# Patient Record
Sex: Male | Born: 1960 | Race: White | Hispanic: No | Marital: Married | Smoking: Never smoker
Health system: Southern US, Community
[De-identification: ages and names within clinical notes are randomized; demographics above are authoritative.]

## PROBLEM LIST (undated history)

## (undated) DIAGNOSIS — M549 Dorsalgia, unspecified: Secondary | ICD-10-CM

## (undated) DIAGNOSIS — K7581 Nonalcoholic steatohepatitis (NASH): Secondary | ICD-10-CM

## (undated) DIAGNOSIS — E785 Hyperlipidemia, unspecified: Secondary | ICD-10-CM

## (undated) DIAGNOSIS — G8929 Other chronic pain: Secondary | ICD-10-CM

## (undated) DIAGNOSIS — K7460 Unspecified cirrhosis of liver: Secondary | ICD-10-CM

## (undated) DIAGNOSIS — I8500 Esophageal varices without bleeding: Secondary | ICD-10-CM

## (undated) DIAGNOSIS — E119 Type 2 diabetes mellitus without complications: Secondary | ICD-10-CM

## (undated) HISTORY — DX: Hyperlipidemia, unspecified: E78.5

## (undated) HISTORY — DX: Type 2 diabetes mellitus without complications: E11.9

## (undated) HISTORY — PX: EXCESSIVE THIGH / HIP / BUTTOCK / FLANK SKIN EXCISION: SUR516

## (undated) HISTORY — DX: Dorsalgia, unspecified: M54.9

## (undated) HISTORY — PX: TONSILLECTOMY AND ADENOIDECTOMY: SUR1326

## (undated) HISTORY — DX: Unspecified cirrhosis of liver: K74.60

## (undated) HISTORY — PX: KIDNEY STONE SURGERY: SHX686

## (undated) HISTORY — DX: Other chronic pain: G89.29

---

## 2004-04-07 ENCOUNTER — Ambulatory Visit: Payer: Self-pay | Admitting: Internal Medicine

## 2004-04-23 ENCOUNTER — Ambulatory Visit: Payer: Self-pay | Admitting: Internal Medicine

## 2004-06-10 ENCOUNTER — Ambulatory Visit: Payer: Self-pay | Admitting: Internal Medicine

## 2004-06-17 ENCOUNTER — Other Ambulatory Visit: Payer: Self-pay

## 2004-06-18 ENCOUNTER — Ambulatory Visit: Payer: Self-pay | Admitting: Urology

## 2004-08-17 ENCOUNTER — Ambulatory Visit: Payer: Self-pay | Admitting: Internal Medicine

## 2005-11-26 ENCOUNTER — Ambulatory Visit: Payer: Self-pay | Admitting: Internal Medicine

## 2006-01-19 ENCOUNTER — Ambulatory Visit: Payer: Self-pay | Admitting: Internal Medicine

## 2006-02-19 ENCOUNTER — Ambulatory Visit: Payer: Self-pay | Admitting: Internal Medicine

## 2007-09-01 ENCOUNTER — Ambulatory Visit: Payer: Self-pay | Admitting: Gastroenterology

## 2007-12-20 ENCOUNTER — Ambulatory Visit: Payer: Self-pay | Admitting: Internal Medicine

## 2008-08-22 LAB — HM COLONOSCOPY

## 2009-10-07 ENCOUNTER — Emergency Department: Payer: Self-pay | Admitting: Emergency Medicine

## 2010-07-22 ENCOUNTER — Inpatient Hospital Stay: Payer: Self-pay | Admitting: Internal Medicine

## 2012-05-08 DIAGNOSIS — I864 Gastric varices: Secondary | ICD-10-CM | POA: Insufficient documentation

## 2012-05-08 DIAGNOSIS — I8390 Asymptomatic varicose veins of unspecified lower extremity: Secondary | ICD-10-CM | POA: Insufficient documentation

## 2012-05-08 DIAGNOSIS — I8500 Esophageal varices without bleeding: Secondary | ICD-10-CM | POA: Insufficient documentation

## 2012-05-23 DIAGNOSIS — K7682 Hepatic encephalopathy: Secondary | ICD-10-CM | POA: Insufficient documentation

## 2012-05-23 DIAGNOSIS — K7290 Hepatic failure, unspecified without coma: Secondary | ICD-10-CM | POA: Insufficient documentation

## 2012-11-14 LAB — TSH: TSH: 2.5 u[IU]/mL (ref <=5.90)

## 2013-06-11 ENCOUNTER — Emergency Department: Payer: Self-pay | Admitting: Emergency Medicine

## 2013-06-11 LAB — URINALYSIS, COMPLETE
Bacteria: NONE SEEN
Bilirubin,UR: NEGATIVE
Glucose,UR: >=500 mg/dL (ref 0–75)
Nitrite: NEGATIVE
Ph: 5 (ref 4.5–8.0)
Protein: NEGATIVE
RBC,UR: 7 /HPF (ref 0–5)
Specific Gravity: 1.031 (ref 1.003–1.030)
Squamous Epithelial: 2
WBC UR: 3 /HPF (ref 0–5)

## 2013-06-11 LAB — COMPREHENSIVE METABOLIC PANEL
Albumin: 3.7 g/dL (ref 3.4–5.0)
Alkaline Phosphatase: 159 U/L — ABNORMAL HIGH
Anion Gap: 6 — ABNORMAL LOW (ref 7–16)
BUN: 13 mg/dL (ref 7–18)
Bilirubin,Total: 2 mg/dL — ABNORMAL HIGH (ref 0.2–1.0)
Calcium, Total: 8.8 mg/dL (ref 8.5–10.1)
Chloride: 101 mmol/L (ref 98–107)
Co2: 26 mmol/L (ref 21–32)
Creatinine: 1.14 mg/dL (ref 0.60–1.30)
EGFR (African American): >60
EGFR (Non-African Amer.): >60
Glucose: 251 mg/dL — ABNORMAL HIGH (ref 65–99)
Osmolality: 275 (ref 275–301)
Potassium: 4.1 mmol/L (ref 3.5–5.1)
SGOT(AST): 61 U/L — ABNORMAL HIGH (ref 15–37)
SGPT (ALT): 45 U/L (ref 12–78)
Sodium: 133 mmol/L — ABNORMAL LOW (ref 136–145)
Total Protein: 7.3 g/dL (ref 6.4–8.2)

## 2013-06-11 LAB — CBC
HCT: 42.9 % (ref 40.0–52.0)
HGB: 14.2 g/dL (ref 13.0–18.0)
MCH: 30.5 pg (ref 26.0–34.0)
MCHC: 33.2 g/dL (ref 32.0–36.0)
MCV: 92 fL (ref 80–100)
Platelet: 57 10*3/uL — ABNORMAL LOW (ref 150–440)
RBC: 4.66 10*6/uL (ref 4.40–5.90)
RDW: 12.8 % (ref 11.5–14.5)
WBC: 6.1 10*3/uL (ref 3.8–10.6)

## 2013-06-11 LAB — LIPASE, BLOOD: Lipase: 330 U/L (ref 73–393)

## 2013-06-13 LAB — URINE CULTURE

## 2014-09-30 LAB — HEPATIC FUNCTION PANEL
ALT: 27 U/L (ref 10–40)
AST: 30 U/L (ref 14–40)
Alkaline Phosphatase: 149 U/L — AB (ref 25–125)
Bilirubin, Total: 1.6 mg/dL

## 2014-09-30 LAB — BASIC METABOLIC PANEL
BUN: 11 mg/dL (ref 4–21)
Creatinine: 1 mg/dL (ref <=1.3)
Glucose: 261 mg/dL
Potassium: 4.3 mmol/L (ref 3.4–5.3)
Sodium: 139 mmol/L (ref 137–147)

## 2014-09-30 LAB — LIPID PANEL
Cholesterol: 140 mg/dL (ref 0–200)
HDL: 42 mg/dL (ref 35–70)
LDL Cholesterol: 105 mg/dL
LDl/HDL Ratio: 2.5
Triglycerides: 114 mg/dL (ref 40–160)

## 2014-09-30 LAB — CBC AND DIFFERENTIAL
HCT: 43 % (ref 41–53)
Hemoglobin: 15.2 g/dL (ref 13.5–17.5)
Neutrophils Absolute: 66 /uL
Platelets: 70 10*3/uL — AB (ref 150–399)
WBC: 7.6 10^3/mL

## 2014-09-30 LAB — HEMOGLOBIN A1C: Hgb A1c MFr Bld: 9.1 % — AB (ref 4.0–6.0)

## 2014-11-12 ENCOUNTER — Ambulatory Visit
Admission: RE | Admit: 2014-11-12 | Discharge: 2014-11-12 | Disposition: A | Discharge status: Home / Self Care | Payer: 59 | Source: Ambulatory Visit | Attending: Family Medicine | Admitting: Family Medicine

## 2014-11-12 ENCOUNTER — Other Ambulatory Visit: Payer: Self-pay | Admitting: Family Medicine

## 2014-11-12 DIAGNOSIS — T148XXA Other injury of unspecified body region, initial encounter: Secondary | ICD-10-CM

## 2014-11-12 DIAGNOSIS — M549 Dorsalgia, unspecified: Secondary | ICD-10-CM | POA: Diagnosis not present

## 2014-11-12 DIAGNOSIS — T148 Other injury of unspecified body region: Secondary | ICD-10-CM | POA: Diagnosis present

## 2014-11-12 DIAGNOSIS — R52 Pain, unspecified: Secondary | ICD-10-CM

## 2014-11-12 DIAGNOSIS — J9811 Atelectasis: Secondary | ICD-10-CM | POA: Diagnosis not present

## 2014-11-21 ENCOUNTER — Telehealth: Payer: Self-pay

## 2014-11-21 ENCOUNTER — Telehealth: Payer: Self-pay | Admitting: Family Medicine

## 2014-11-21 NOTE — Telephone Encounter (Signed)
Patient called back and states this is the time of the day where his back pain is worse and wanted to see if he can increase Oxycodone to 2 tablets or if something different needs to be prescribed. Spoke with Dr. Rosanna Randy and advised as follows-takes Oxycodone 5 mg 2 tablets now and then can take every 3 hours up to 2 tablets at a time and see how patient does over night. Will re access tomorrow when we see the patient.

## 2014-11-21 NOTE — Telephone Encounter (Signed)
Pt would like a call back because he is in more pain than he was at his OV on 11/12/14. Pt wanted to know if he should come back in or be referred to a chiropractor. Thanks TNP

## 2014-11-21 NOTE — Telephone Encounter (Signed)
Spoke with patient and Dr. Rosanna Randy. Appt made for tomorrow morning at 8:30 am. Advised patient if symptoms get worse or new concerning symptoms develop to go to ER. Pt understood.

## 2014-11-22 ENCOUNTER — Ambulatory Visit: Payer: Self-pay | Admitting: Family Medicine

## 2014-11-22 ENCOUNTER — Ambulatory Visit (INDEPENDENT_AMBULATORY_CARE_PROVIDER_SITE_OTHER): Payer: 59 | Admitting: Family Medicine

## 2014-11-22 ENCOUNTER — Ambulatory Visit
Admission: RE | Admit: 2014-11-22 | Discharge: 2014-11-22 | Disposition: A | Discharge status: Home / Self Care | Payer: 59 | Source: Ambulatory Visit | Attending: Family Medicine | Admitting: Family Medicine

## 2014-11-22 ENCOUNTER — Encounter: Payer: Self-pay | Admitting: Family Medicine

## 2014-11-22 ENCOUNTER — Telehealth: Payer: Self-pay

## 2014-11-22 VITALS — BP 124/76 | HR 80 | Temp 98.6°F | Resp 14 | Wt 317.0 lb

## 2014-11-22 DIAGNOSIS — I10 Essential (primary) hypertension: Secondary | ICD-10-CM | POA: Insufficient documentation

## 2014-11-22 DIAGNOSIS — E669 Obesity, unspecified: Secondary | ICD-10-CM | POA: Insufficient documentation

## 2014-11-22 DIAGNOSIS — K8020 Calculus of gallbladder without cholecystitis without obstruction: Secondary | ICD-10-CM | POA: Insufficient documentation

## 2014-11-22 DIAGNOSIS — D696 Thrombocytopenia, unspecified: Secondary | ICD-10-CM | POA: Diagnosis present

## 2014-11-22 DIAGNOSIS — E785 Hyperlipidemia, unspecified: Secondary | ICD-10-CM | POA: Insufficient documentation

## 2014-11-22 DIAGNOSIS — T1490 Injury, unspecified: Secondary | ICD-10-CM | POA: Diagnosis present

## 2014-11-22 DIAGNOSIS — N529 Male erectile dysfunction, unspecified: Secondary | ICD-10-CM | POA: Insufficient documentation

## 2014-11-22 DIAGNOSIS — K7460 Unspecified cirrhosis of liver: Secondary | ICD-10-CM | POA: Insufficient documentation

## 2014-11-22 DIAGNOSIS — R233 Spontaneous ecchymoses: Secondary | ICD-10-CM

## 2014-11-22 DIAGNOSIS — F329 Major depressive disorder, single episode, unspecified: Secondary | ICD-10-CM | POA: Insufficient documentation

## 2014-11-22 DIAGNOSIS — S20221S Contusion of right back wall of thorax, sequela: Secondary | ICD-10-CM

## 2014-11-22 DIAGNOSIS — X58XXXA Exposure to other specified factors, initial encounter: Secondary | ICD-10-CM | POA: Insufficient documentation

## 2014-11-22 DIAGNOSIS — M549 Dorsalgia, unspecified: Secondary | ICD-10-CM

## 2014-11-22 DIAGNOSIS — K7469 Other cirrhosis of liver: Secondary | ICD-10-CM | POA: Diagnosis not present

## 2014-11-22 DIAGNOSIS — E1169 Type 2 diabetes mellitus with other specified complication: Secondary | ICD-10-CM | POA: Insufficient documentation

## 2014-11-22 DIAGNOSIS — G4700 Insomnia, unspecified: Secondary | ICD-10-CM | POA: Insufficient documentation

## 2014-11-22 DIAGNOSIS — E291 Testicular hypofunction: Secondary | ICD-10-CM | POA: Insufficient documentation

## 2014-11-22 DIAGNOSIS — R3129 Other microscopic hematuria: Secondary | ICD-10-CM

## 2014-11-22 DIAGNOSIS — R238 Other skin changes: Secondary | ICD-10-CM

## 2014-11-22 DIAGNOSIS — K219 Gastro-esophageal reflux disease without esophagitis: Secondary | ICD-10-CM | POA: Insufficient documentation

## 2014-11-22 DIAGNOSIS — G8929 Other chronic pain: Secondary | ICD-10-CM | POA: Insufficient documentation

## 2014-11-22 DIAGNOSIS — E559 Vitamin D deficiency, unspecified: Secondary | ICD-10-CM | POA: Insufficient documentation

## 2014-11-22 DIAGNOSIS — R312 Other microscopic hematuria: Secondary | ICD-10-CM

## 2014-11-22 DIAGNOSIS — IMO0002 Reserved for concepts with insufficient information to code with codable children: Secondary | ICD-10-CM | POA: Insufficient documentation

## 2014-11-22 LAB — POCT URINALYSIS DIPSTICK
Bilirubin, UA: NEGATIVE
Glucose, UA: 500
Ketones, UA: NEGATIVE
Leukocytes, UA: NEGATIVE
Nitrite, UA: NEGATIVE
Protein, UA: NEGATIVE
Spec Grav, UA: 1.02
Urobilinogen, UA: NEGATIVE
pH, UA: 7

## 2014-11-22 LAB — POCT UA - MICROSCOPIC ONLY
Bacteria, U Microscopic: NEGATIVE
Casts, Ur, LPF, POC: NEGATIVE
Crystals, Ur, HPF, POC: NEGATIVE
Epithelial cells, urine per micros: NEGATIVE
Mucus, UA: NEGATIVE
Yeast, UA: NEGATIVE

## 2014-11-22 MED ORDER — OXYCODONE HCL 5 MG PO CAPS: ORAL_CAPSULE | ORAL | Status: DC

## 2014-11-22 NOTE — Telephone Encounter (Signed)
Talked to [place] Clinician, regarding [place] results,  According to report  There was no Evidence of fluid intraperitoneal.  Korea can be sensitive to solid organs. They recommend CT.  Talked to MD Rosanna Randy, MD advised for pt to come back to the office.  Call was disconnected while talking to provider. Left message on pt's cell phone number to come back to office and if any questions to call.

## 2014-11-22 NOTE — Progress Notes (Signed)
Patient ID: NANDAN WILLEMS, male   DOB: 1960-09-05, 54 y.o.   MRN: [id number] Name: Jesse Zimmerman   MRN: [id number]    DOB: [birth year]   Date:11/22/2014       Progress Note  Subjective  Chief Complaint  Chief Complaint  Patient presents with  . Back Pain    x 2 weeks  . Hematuria    on the exam during his last visit  . Bleeding/Bruising    worsening from a week ago    Back Pain The problem has been gradually worsening (Patient is here for follow up. He was last seen on May 24th after falling in the tub on his right dise of the body. Rib xray was done which showed atelectasis, was given Oxycodone 5 mg and Valium to take as needed) since onset. Pertinent negatives include no chest pain, dysuria, fever or weakness. Risk factors include recent trauma. The treatment provided mild (He has taking Oxycodone 5 mg but that did not seem to help at all, he has taking Advil some too and Valium as needed. He did take Oxycodone 2 tablets last night and was able to finally sleep better. He states he is worried due to bruising getting worse ) relief.  Hematuria This is a new (this was found on the exam on his last visit) problem. Irritative symptoms do not include frequency or urgency. Associated symptoms include nausea. Pertinent negatives include no chills, dysuria, fever or flank pain.     Past Surgical History  Procedure Laterality Date  . Kidney stone surgery      removed  . Excessive thigh / hip / buttock / flank skin excision      PART OF INNER THIGH/DUE TO SEPSIS INFECTION REMOVED  . Tonsillectomy and adenoidectomy      Family History  Problem Relation Age of Onset  . Hypertension Mother   . Breast cancer Mother   . Dementia Mother   . Heart attack Father   . Gout Father   . Hypertension Father   . Hypertension Sister   . Colon cancer Maternal Aunt   . Colon cancer Maternal Grandmother   . Colon cancer Paternal Grandmother   . Hypertension Sister     History   Social History   . Marital Status: Married    Spouse Name: Velva Harman  . Number of Children: 0  . Years of Education: college   Occupational History  . work at [employer] Topics  . Smoking status: Never Smoker   . Smokeless tobacco: Never Used  . Alcohol Use: No  . Drug Use: No  . Sexual Activity: Not Currently   Other Topics Concern  . Not on file   Social History Narrative     Current outpatient prescriptions:  .  Canagliflozin-Metformin HCl 50-1000 MG TABS, Take by mouth., Disp: , Rfl:  .  diazepam (VALIUM) 5 MG tablet, Take by mouth., Disp: , Rfl:  .  furosemide (LASIX) 20 MG tablet, Take by mouth., Disp: , Rfl:  .  nadolol (CORGARD) 20 MG tablet, Take by mouth., Disp: , Rfl:  .  oxycodone (OXY-IR) 5 MG capsule, 1  To 2 tablets every 3 hours as needed for severe pain, Disp: 150 capsule, Rfl: 0 .  ergocalciferol (VITAMIN D2) 50000 UNITS capsule, Take by mouth., Disp: , Rfl:  .  HYDROcodone-acetaminophen (NORCO) 10-325 MG per tablet, Take by mouth., Disp: , Rfl:  .  ondansetron (ZOFRAN) 8 MG tablet, Take by  mouth., Disp: , Rfl:   Allergies  Allergen Reactions  . Latex     LATEX TAPE  . Pineapple Hives     Review of Systems  Constitutional: Positive for malaise/fatigue (no appetite). Negative for fever and chills.  Cardiovascular: Negative for chest pain, palpitations, orthopnea and claudication.  Gastrointestinal: Positive for nausea.  Genitourinary: Negative for dysuria, urgency, frequency, hematuria and flank pain.  Musculoskeletal: Positive for back pain.  Neurological: Negative for weakness.  Endo/Heme/Allergies: Bruises/bleeds easily (bruising present abnormal).      Objective  Filed Vitals:   11/22/14 0835  BP: 124/76  Pulse: 80  Temp: 98.6 F (37 C)  Resp: 14  Weight: 317 lb (143.79 kg)    Physical Exam  Constitutional: He is well-developed, well-nourished, and in no distress.  Eyes: Conjunctivae are normal. Pupils are equal, round, and  reactive to light.  Cardiovascular: Normal rate, regular rhythm, normal heart sounds and intact distal pulses.   Pulmonary/Chest: Effort normal and breath sounds normal. He has no wheezes. He exhibits no tenderness.  Abdominal: There is tenderness in the right upper quadrant.  Some mild to moderate tenderness RUQ over the liver.  Musculoskeletal:       Arms: RIGHT CVA TENDERNESS PRESENT, also tender along lateral ribs extending lateral lower ribs.  Tender entire length of the ribs on the right.     Assessment & Plan         Visit Diagnoses    Contusion, back, right, sequela    -  Primary    Microscopic hematuria        Abnormal bruising          1. Contusion, back, right, sequela  Worsening. Order [place] and change Oxycodone frequency. Pending results. Provided a note for work for [place] patient to work from home for 1 week. - US Abdomen Complete; Future Reassured pt--if this worsens will obtain CT scan for bleeding into kidneys or liver. I have no doubt that he does have some bruising of kidney from the trauma he sustained. 2. Microscopic hematuria  - POCT Urinalysis Dipstick - POCT UA - Glucose/Protein  3. Abnormal bruising Pending results. - CBC w/Diff - Comp Met (CMET)  4. Other cirrhosis of liver Follows GI at [facility]. - Comp Met (CMET) - PTT  5.Morbid obesity

## 2014-11-23 LAB — CBC WITH DIFFERENTIAL/PLATELET
Basophils Absolute: 0 10*3/uL (ref 0.0–0.2)
Basos: 1 %
EOS (ABSOLUTE): 0.3 10*3/uL (ref 0.0–0.4)
Eos: 5 %
Hematocrit: 40.8 % (ref 37.5–51.0)
Hemoglobin: 14.5 g/dL (ref 12.6–17.7)
Immature Grans (Abs): 0 10*3/uL (ref 0.0–0.1)
Immature Granulocytes: 0 %
Lymphocytes Absolute: 1.1 10*3/uL (ref 0.7–3.1)
Lymphs: 19 %
MCH: 33 pg (ref 26.6–33.0)
MCHC: 35.5 g/dL (ref 31.5–35.7)
MCV: 93 fL (ref 79–97)
Monocytes Absolute: 0.5 10*3/uL (ref 0.1–0.9)
Monocytes: 9 %
Neutrophils Absolute: 3.9 10*3/uL (ref 1.4–7.0)
Neutrophils: 66 %
Platelets: 71 10*3/uL — CL (ref 150–379)
RBC: 4.39 x10E6/uL (ref 4.14–5.80)
RDW: 13.6 % (ref 12.3–15.4)
WBC: 5.9 10*3/uL (ref 3.4–10.8)

## 2014-11-23 LAB — COMPREHENSIVE METABOLIC PANEL
ALT: 26 IU/L (ref 0–44)
AST: 31 IU/L (ref 0–40)
Albumin/Globulin Ratio: 1.1 (ref 1.1–2.5)
Albumin: 3.7 g/dL (ref 3.5–5.5)
Alkaline Phosphatase: 186 IU/L — ABNORMAL HIGH (ref 39–117)
BUN/Creatinine Ratio: 13 (ref 9–20)
BUN: 12 mg/dL (ref 6–24)
Bilirubin Total: 1.6 mg/dL — ABNORMAL HIGH (ref 0.0–1.2)
CO2: 22 mmol/L (ref 18–29)
Calcium: 8.8 mg/dL (ref 8.7–10.2)
Chloride: 98 mmol/L (ref 97–108)
Creatinine, Ser: 0.94 mg/dL (ref 0.76–1.27)
GFR calc Af Amer: 107 mL/min/{1.73_m2} (ref >59)
GFR calc non Af Amer: 92 mL/min/{1.73_m2} (ref >59)
Globulin, Total: 3.3 g/dL (ref 1.5–4.5)
Glucose: 310 mg/dL — ABNORMAL HIGH (ref 65–99)
Potassium: 4.3 mmol/L (ref 3.5–5.2)
Sodium: 136 mmol/L (ref 134–144)
Total Protein: 7 g/dL (ref 6.0–8.5)

## 2014-11-23 LAB — APTT: aPTT: 30 s (ref 24–33)

## 2014-11-23 NOTE — Patient Instructions (Signed)
Pain very slowly getting better.

## 2014-11-25 ENCOUNTER — Telehealth: Payer: Self-pay

## 2014-11-25 NOTE — Telephone Encounter (Signed)
Pt advised as below-aa

## 2014-11-25 NOTE — Telephone Encounter (Signed)
-----   Message from Jerrol Banana., MD sent at 11/25/2014  8:42 AM EDT ----- Labs stable. Please advise patient.

## 2014-12-26 ENCOUNTER — Encounter: Payer: Self-pay | Admitting: Family Medicine

## 2014-12-26 ENCOUNTER — Ambulatory Visit (INDEPENDENT_AMBULATORY_CARE_PROVIDER_SITE_OTHER): Payer: 59 | Admitting: Family Medicine

## 2014-12-26 VITALS — BP 132/74 | HR 80 | Temp 98.7°F | Resp 18 | Wt 316.0 lb

## 2014-12-26 DIAGNOSIS — S20221D Contusion of right back wall of thorax, subsequent encounter: Secondary | ICD-10-CM | POA: Diagnosis not present

## 2014-12-26 DIAGNOSIS — S20229A Contusion of unspecified back wall of thorax, initial encounter: Secondary | ICD-10-CM | POA: Insufficient documentation

## 2014-12-26 DIAGNOSIS — E1169 Type 2 diabetes mellitus with other specified complication: Secondary | ICD-10-CM

## 2014-12-26 DIAGNOSIS — E669 Obesity, unspecified: Secondary | ICD-10-CM | POA: Diagnosis not present

## 2014-12-26 DIAGNOSIS — D696 Thrombocytopenia, unspecified: Secondary | ICD-10-CM

## 2014-12-26 DIAGNOSIS — T148 Other injury of unspecified body region: Secondary | ICD-10-CM

## 2014-12-26 DIAGNOSIS — E119 Type 2 diabetes mellitus without complications: Secondary | ICD-10-CM

## 2014-12-26 DIAGNOSIS — K7469 Other cirrhosis of liver: Secondary | ICD-10-CM | POA: Diagnosis not present

## 2014-12-26 DIAGNOSIS — T148XXA Other injury of unspecified body region, initial encounter: Secondary | ICD-10-CM | POA: Insufficient documentation

## 2014-12-26 NOTE — Progress Notes (Signed)
Patient ID: Jesse Zimmerman, male   DOB: April 26, 1961, 55 y.o.   MRN: [id number]    Subjective:  HPI  Contusion, Right side of the back  Patient is here for follow up. On his last visit last month [place] was ordered, increased Oxycodone frequency use and ordered labs due to abnormal bruising. Patient states he is doing better still has some rib tenderness but bruising has resolved. He is not taking Oxycodone anymore he only took 6 tablets since he had issue with all of this. He has taking some Advil also. He is doing better.  It is time to follow-up on his A1c next week. For his diabetes. He is pleased overall about his obesity. He states 10 years ago he was 452 pounds. He has lost 22 inches and his waist since that time and about 116 pounds. He does have gallstones but his hepatologist told him to not have his gallbladder removed.    Prior to Admission medications   Medication Sig Start Date End Date Taking? Authorizing Provider  Canagliflozin-Metformin HCl 50-1000 MG TABS Take by mouth. 02/21/14   Historical Provider, MD  diazepam (VALIUM) 5 MG tablet Take by mouth. 11/12/14   Historical Provider, MD  ergocalciferol (VITAMIN D2) 50000 UNITS capsule Take by mouth. 10/02/14   Historical Provider, MD  furosemide (LASIX) 20 MG tablet Take by mouth. 11/23/12   Historical Provider, MD  HYDROcodone-acetaminophen (NORCO) 10-325 MG per tablet Take by mouth. 09/30/14   Historical Provider, MD  nadolol (CORGARD) 20 MG tablet Take by mouth. 07/26/12   Historical Provider, MD  ondansetron (ZOFRAN) 8 MG tablet Take by mouth. 02/07/14   Historical Provider, MD  oxycodone (OXY-IR) 5 MG capsule 1  To 2 tablets every 3 hours as needed for severe pain 11/22/14   Jerrol Banana., MD    Patient Active Problem List   Diagnosis Date Noted  . Contusion, back 12/26/2014  . Bruising 12/26/2014  . Back pain, chronic 11/22/2014  . Diabetes mellitus type 2 in obese 11/22/2014  . 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  Acid reflux 11/22/2014  . HLD (hyperlipidemia) 11/22/2014  . BP (high blood pressure) 11/22/2014  . 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 Cannot sleep 11/22/2014  . Cirrhosis 11/22/2014  . Adiposity 11/22/2014  . Change in blood platelet count 11/22/2014  . Avitaminosis D 11/22/2014  . Encephalopathy, hepatic 05/23/2012  . 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 No pertinent past medical history.  History   Social History  . Marital Status: Married    Spouse Name: Velva Harman  . Number of Children: 0  . Years of Education: college   Occupational History  . work at [employer] Topics  . Smoking status: Never Smoker   . Smokeless tobacco: Never Used  . Alcohol Use: No  . Drug Use: No  . Sexual Activity: Not Currently   Other Topics Concern  . Not on file   Social History Narrative    Allergies  Allergen Reactions  . Latex     LATEX TAPE  . Pineapple Hives    Review of Systems  Constitutional: Negative for fever, chills, malaise/fatigue and diaphoresis.  Respiratory: Negative for cough, shortness of breath and wheezing.   Cardiovascular: Negative for chest pain, palpitations, claudication, leg swelling and PND.  Gastrointestinal: Negative for heartburn, nausea, vomiting, diarrhea, constipation and blood in stool.  Musculoskeletal: Negative for myalgias, back pain, joint pain, falls and neck pain.  Neurological: Negative for dizziness,  tremors, weakness and headaches.    Immunization History  Administered Date(s) Administered  . Hepatitis A 09/05/2008, 03/10/2009  . Hepatitis B 09/05/2008, 10/07/2008, 03/10/2009   Objective:  BP 132/74 mmHg  Pulse 80  Temp(Src) 98.7 F (37.1 C)  Resp 18  Wt 316 lb (143.337 kg)  Physical Exam  Constitutional: He is oriented to person, place, and time and well-developed, well-nourished, and in no distress. No distress.  HENT:  Left Ear: Tympanic membrane, external ear and ear canal normal. No drainage or  swelling.  Right TM is slightly fuller than Left TM  Eyes: Conjunctivae are normal. Pupils are equal, round, and reactive to light.  Neck: Normal range of motion.  Cardiovascular: Normal rate, regular rhythm, normal heart sounds and intact distal pulses.  Exam reveals no friction rub.   No murmur heard. Pulmonary/Chest: Effort normal and breath sounds normal. No respiratory distress. He has no wheezes. He has no rales. He exhibits no tenderness.  Abdominal: Soft. Bowel sounds are normal. He exhibits no distension. There is no tenderness. There is no rebound.  Musculoskeletal: Normal range of motion. He exhibits no edema or tenderness.  Neurological: He is alert and oriented to person, place, and time.  Psychiatric: Mood, memory, affect and judgment normal.   1+ edema bilaterally to the knees.  Lab Results  Component Value Date   WBC 5.9 11/22/2014   HGB 15.2 09/30/2014   HCT 40.8 11/22/2014   PLT 70* 09/30/2014   GLUCOSE 310* 11/22/2014   CHOL 140 09/30/2014   TRIG 114 09/30/2014   HDL 42 09/30/2014   LDLCALC 105 09/30/2014   TSH 2.50 11/14/2012   HGBA1C 9.1* 09/30/2014    CMP     Component Value Date/Time   NA 136 11/22/2014 0944   NA 133* 06/11/2013 0902   K 4.3 11/22/2014 0944   K 4.1 06/11/2013 0902   CL 98 11/22/2014 0944   CL 101 06/11/2013 0902   CO2 22 11/22/2014 0944   CO2 26 06/11/2013 0902   GLUCOSE 310* 11/22/2014 0944   GLUCOSE 251* 06/11/2013 0902   BUN 12 11/22/2014 0944   BUN 13 06/11/2013 0902   CREATININE 0.94 11/22/2014 0944   CREATININE 1.0 09/30/2014   CREATININE 1.14 06/11/2013 0902   CALCIUM 8.8 11/22/2014 0944   CALCIUM 8.8 06/11/2013 0902   PROT 7.0 11/22/2014 0944   PROT 7.3 06/11/2013 0902   ALBUMIN 3.7 06/11/2013 0902   AST 31 11/22/2014 0944   AST 61* 06/11/2013 0902   ALT 26 11/22/2014 0944   ALT 45 06/11/2013 0902   ALKPHOS 186* 11/22/2014 0944   ALKPHOS 159* 06/11/2013 0902   BILITOT 1.6* 11/22/2014 0944   GFRNONAA 92  11/22/2014 0944   GFRNONAA >60 06/11/2013 0902   GFRAA 107 11/22/2014 0944   GFRAA >60 06/11/2013 0902    Assessment and Plan :   1. Contusion, back, right, subsequent encounter Improved. Continue to follow as needed.  2. Other cirrhosis of liver Stable.  3. Bruising Resolved.  4. Thrombocytopenia Repeat labs. - CBC with Differential/Platelet  5. Adiposity  - CBC with Differential/Platelet - TSH  6. Diabetes mellitus type 2 in obese Check labs. - COMPLETE METABOLIC PANEL WITH GFR - HgB A1c  7. Hyperlipidemia Consider statin use even in this patient with cirrhosis. May be 3 times a week of Crestor.  I have done the exam and reviewed the above chart and it is accurate to the best of my knowledge.    Patient was seen  and examined by Dr. Eulas Post and note was scribed by Theressa Millard, RMA.  Miguel Aschoff MD [facility] 12/26/2014 1:42 PM

## 2015-01-25 LAB — HEMOGLOBIN A1C
Est. average glucose Bld gHb Est-mCnc: 232 mg/dL
Hgb A1c MFr Bld: 9.7 % — ABNORMAL HIGH (ref 4.8–5.6)

## 2015-01-25 LAB — CBC WITH DIFFERENTIAL/PLATELET
Basophils Absolute: 0 10*3/uL (ref 0.0–0.2)
Basos: 1 %
EOS (ABSOLUTE): 0.2 10*3/uL (ref 0.0–0.4)
Eos: 4 %
Hematocrit: 41.5 % (ref 37.5–51.0)
Hemoglobin: 14.9 g/dL (ref 12.6–17.7)
Immature Grans (Abs): 0 10*3/uL (ref 0.0–0.1)
Immature Granulocytes: 0 %
Lymphocytes Absolute: 1.1 10*3/uL (ref 0.7–3.1)
Lymphs: 22 %
MCH: 32.7 pg (ref 26.6–33.0)
MCHC: 35.9 g/dL — ABNORMAL HIGH (ref 31.5–35.7)
MCV: 91 fL (ref 79–97)
Monocytes Absolute: 0.3 10*3/uL (ref 0.1–0.9)
Monocytes: 5 %
Neutrophils Absolute: 3.2 10*3/uL (ref 1.4–7.0)
Neutrophils: 68 %
Platelets: 56 10*3/uL — CL (ref 150–379)
RBC: 4.55 x10E6/uL (ref 4.14–5.80)
RDW: 13.8 % (ref 12.3–15.4)
WBC: 4.8 10*3/uL (ref 3.4–10.8)

## 2015-01-25 LAB — TSH: TSH: 1.8 u[IU]/mL (ref 0.450–4.500)

## 2015-01-28 NOTE — Progress Notes (Signed)
Patient advised  ED 

## 2015-03-31 ENCOUNTER — Other Ambulatory Visit: Payer: Self-pay | Admitting: Family Medicine

## 2015-04-02 ENCOUNTER — Ambulatory Visit (INDEPENDENT_AMBULATORY_CARE_PROVIDER_SITE_OTHER): Payer: 59 | Admitting: Family Medicine

## 2015-04-02 ENCOUNTER — Encounter: Payer: Self-pay | Admitting: Family Medicine

## 2015-04-02 VITALS — BP 142/78 | HR 76 | Temp 98.7°F | Resp 16 | Ht 72.0 in | Wt 317.0 lb

## 2015-04-02 DIAGNOSIS — E669 Obesity, unspecified: Secondary | ICD-10-CM

## 2015-04-02 DIAGNOSIS — N200 Calculus of kidney: Secondary | ICD-10-CM | POA: Diagnosis not present

## 2015-04-02 MED ORDER — PROMETHAZINE HCL 25 MG RE SUPP: 25.0000 mg | Freq: Four times a day (QID) | RECTAL | Status: DC | PRN

## 2015-04-02 NOTE — Progress Notes (Signed)
Patient ID: Jesse Zimmerman, male   DOB: [birth year], 54 y.o.   MRN: [id number]    Subjective:  HPI  Patient is here to discuss insurance form for BMI, he did not meet the guideline for this. Patient walks 3 days a week, cut back on portions of foods, does not eat as much in the evening time, he is trying to eat more healthier.  Patient also has had some cold symptoms for a few days and with him traveling this coming up weekend he wanted to get this looked at. He states he has had some cough, sweats and right ear and right side of the face felt congested.  He also has a cyst/rash on the left side of the abdomen. Wife states this irrittation has increased in size.  Prior to Admission medications   Medication Sig Start Date End Date Taking? Authorizing Provider  diazepam (VALIUM) 5 MG tablet Take by mouth. 11/12/14  Yes Historical Provider, MD  ergocalciferol (VITAMIN D2) 50000 UNITS capsule Take by mouth. 10/02/14  Yes Historical Provider, MD  furosemide (LASIX) 20 MG tablet Take by mouth. 11/23/12  Yes Historical Provider, MD  HYDROcodone-acetaminophen (NORCO) 10-325 MG per tablet Take by mouth. 09/30/14  Yes Historical Provider, MD  INVOKAMET 50-1000 MG TABS Take 1 tablet by mouth two  times daily 03/31/15  Yes Richard Maceo Pro., MD  nadolol (CORGARD) 20 MG tablet Take by mouth. 07/26/12  Yes Historical Provider, MD  ondansetron (ZOFRAN) 8 MG tablet Take by mouth. 02/07/14  Yes Historical Provider, MD  oxycodone (OXY-IR) 5 MG capsule 1  To 2 tablets every 3 hours as needed for severe pain 11/22/14  Yes Jerrol Banana., MD    Patient Active Problem List   Diagnosis Date Noted  . Contusion, back 12/26/2014  . Bruising 12/26/2014  . Back pain, chronic 11/22/2014  . Diabetes mellitus type 2 in obese ([place]) 11/22/2014  . 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 Acid reflux 11/22/2014  . HLD (hyperlipidemia) 11/22/2014  . BP (high blood pressure) 11/22/2014  . 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 Cannot sleep 11/22/2014  . Cirrhosis ([place]) 11/22/2014  . Adiposity 11/22/2014  . Change in blood platelet count 11/22/2014  . Avitaminosis D 11/22/2014  . Encephalopathy, hepatic ([place]) 05/23/2012  . 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  Social History   Social History  . Marital Status: Married    Spouse Name: Velva Harman  . Number of Children: 0  . Years of Education: college   Occupational History  . work at [employer] Topics  . Smoking status: Never Smoker   . Smokeless tobacco: Never Used  . Alcohol Use: No  . Drug Use: No  . Sexual Activity: Not Currently   Other Topics Concern  . Not on file   Social History Narrative    Allergies  Allergen Reactions  . Latex     LATEX TAPE  . Pineapple Hives    Review of Systems  Constitutional: Positive for chills (sweats). Negative for fever.  HENT: Positive for congestion.   Respiratory: Positive for cough.   Gastrointestinal: Negative.   Musculoskeletal: Negative.   Skin: Positive for rash.  Neurological: Negative.   Endo/Heme/Allergies: Negative.   Psychiatric/Behavioral: Negative.     Immunization History  Administered Date(s) Administered  . Hepatitis A 09/05/2008, 03/10/2009  . Hepatitis B 09/05/2008, 10/07/2008, 03/10/2009   Objective:  BP 142/78 mmHg  Pulse 76  Temp(Src) 98.7 F (37.1 C)  Resp 16  Ht 6' (1.829 m)  Wt 317 lb (143.79 kg)  BMI 42.98 kg/m2  Physical Exam  Constitutional: He is oriented to person, place, and time and well-developed, well-nourished, and in no distress.  HENT:  Head: Normocephalic and atraumatic.  Right Ear: External ear normal.  Left Ear: External ear normal.  Nose: Nose normal.  Eyes: Conjunctivae are normal.  Neck: Neck supple.  Cardiovascular: Normal rate, regular rhythm and normal heart sounds.   Pulmonary/Chest: Effort normal.  Abdominal: Soft.  Neurological: He is alert and oriented to person, place, and time.   Skin: Skin is warm and dry.  Psychiatric: Mood, memory, affect and judgment normal.    Lab Results  Component Value Date   WBC 4.8 01/24/2015   HGB 15.2 09/30/2014   HCT 41.5 01/24/2015   PLT 70* 09/30/2014   GLUCOSE 310* 11/22/2014   CHOL 140 09/30/2014   TRIG 114 09/30/2014   HDL 42 09/30/2014   LDLCALC 105 09/30/2014   TSH 1.800 01/24/2015   HGBA1C 9.7* 01/24/2015    CMP     Component Value Date/Time   NA 136 11/22/2014 0944   NA 133* 06/11/2013 0902   K 4.3 11/22/2014 0944   K 4.1 06/11/2013 0902   CL 98 11/22/2014 0944   CL 101 06/11/2013 0902   CO2 22 11/22/2014 0944   CO2 26 06/11/2013 0902   GLUCOSE 310* 11/22/2014 0944   GLUCOSE 251* 06/11/2013 0902   BUN 12 11/22/2014 0944   BUN 13 06/11/2013 0902   CREATININE 0.94 11/22/2014 0944   CREATININE 1.0 09/30/2014   CREATININE 1.14 06/11/2013 0902   CALCIUM 8.8 11/22/2014 0944   CALCIUM 8.8 06/11/2013 0902   PROT 7.0 11/22/2014 0944   PROT 7.3 06/11/2013 0902   ALBUMIN 3.7 11/22/2014 0944   ALBUMIN 3.7 06/11/2013 0902   AST 31 11/22/2014 0944   AST 61* 06/11/2013 0902   ALT 26 11/22/2014 0944   ALT 45 06/11/2013 0902   ALKPHOS 186* 11/22/2014 0944   ALKPHOS 159* 06/11/2013 0902   BILITOT 1.6* 11/22/2014 0944   BILITOT 2.0* 06/11/2013 0902   GFRNONAA 92 11/22/2014 0944   GFRNONAA >60 06/11/2013 0902   GFRAA 107 11/22/2014 0944   GFRAA >60 06/11/2013 0902    Assessment and Plan :  1. Adiposity  She working on diet and exercise. He has lost about 100 pounds over the last 10 years.   2. 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 Thrombocytopenia 5. Type 2 diabetes 6. Hyperlipidemia  I have done the exam and reviewed the above chart and it is accurate to the best of my knowledge.  Miguel Aschoff MD [facility] 04/02/2015 4:01 PM

## 2015-05-01 ENCOUNTER — Ambulatory Visit: Payer: 59 | Admitting: Family Medicine

## 2015-05-21 ENCOUNTER — Ambulatory Visit (INDEPENDENT_AMBULATORY_CARE_PROVIDER_SITE_OTHER): Payer: 59 | Admitting: Family Medicine

## 2015-05-21 ENCOUNTER — Encounter: Payer: Self-pay | Admitting: Family Medicine

## 2015-05-21 VITALS — BP 144/68 | HR 92 | Temp 98.4°F | Resp 14 | Wt 308.0 lb

## 2015-05-21 DIAGNOSIS — N529 Male erectile dysfunction, unspecified: Secondary | ICD-10-CM

## 2015-05-21 DIAGNOSIS — E785 Hyperlipidemia, unspecified: Secondary | ICD-10-CM

## 2015-05-21 DIAGNOSIS — E1169 Type 2 diabetes mellitus with other specified complication: Secondary | ICD-10-CM

## 2015-05-21 DIAGNOSIS — M5431 Sciatica, right side: Secondary | ICD-10-CM | POA: Diagnosis not present

## 2015-05-21 DIAGNOSIS — I10 Essential (primary) hypertension: Secondary | ICD-10-CM | POA: Diagnosis not present

## 2015-05-21 DIAGNOSIS — E119 Type 2 diabetes mellitus without complications: Secondary | ICD-10-CM | POA: Diagnosis not present

## 2015-05-21 DIAGNOSIS — E669 Obesity, unspecified: Secondary | ICD-10-CM | POA: Diagnosis not present

## 2015-05-21 MED ORDER — FLUCONAZOLE 200 MG PO TABS: 200.0000 mg | ORAL_TABLET | Freq: Every day | ORAL | Status: DC

## 2015-05-21 MED ORDER — PREDNISONE 5 MG (48) PO TBPK
5.0000 mg | ORAL_TABLET | Freq: Every day | ORAL | Status: DC
Start: 2015-05-21 — End: 2015-05-21

## 2015-05-21 MED ORDER — PREDNISONE 5 MG (48) PO TBPK: 5.0000 mg | ORAL_TABLET | Freq: Every day | ORAL | Status: DC

## 2015-05-21 MED ORDER — SILDENAFIL CITRATE 20 MG PO TABS: ORAL_TABLET | ORAL | Status: DC

## 2015-05-21 NOTE — Progress Notes (Signed)
Patient ID: Jesse Zimmerman, male   DOB: 1960/11/09, 54 y.o.   MRN: [id number]    Subjective:  HPI  Patient is here with a complaint of right sciatica pain. He at first had pain in the left leg for about a month off and on, then 1 day his back popped in bed and the pain moved to the right side and has been there constantly for over a week. He has tried to move around and stretch to get the pain to stop but he has not been able to, he has not been able to sleep with it, pain is radiating down to his lower leg. Ibuprofen helps for a little bit.  Also wanted to know if we could refill Fluconazole 200 mg for him for yeast infection that he gets 2 to 3 times a year where he has folds of skin. He has tried other things like Nystatin but nothing works as well as this medication.  He is also complaining of feeling nauseas more, low in energy and decreased appetite. Lost 9 lbs since his visit in October.  Prior to Admission medications   Medication Sig Start Date End Date Taking? Authorizing Provider  diazepam (VALIUM) 5 MG tablet Take by mouth. 11/12/14  Yes Historical Provider, MD  ergocalciferol (VITAMIN D2) 50000 UNITS capsule Take by mouth. 10/02/14  Yes Historical Provider, MD  furosemide (LASIX) 20 MG tablet Take by mouth. 11/23/12  Yes Historical Provider, MD  HYDROcodone-acetaminophen (NORCO) 10-325 MG per tablet Take by mouth. 09/30/14  Yes Historical Provider, MD  INVOKAMET 50-1000 MG TABS Take 1 tablet by mouth two  times daily 03/31/15  Yes Katrianna Friesenhahn Maceo Pro., MD  nadolol (CORGARD) 20 MG tablet Take by mouth. 07/26/12  Yes Historical Provider, MD  ondansetron (ZOFRAN) 8 MG tablet Take by mouth. 02/07/14  Yes Historical Provider, MD  oxycodone (OXY-IR) 5 MG capsule 1  To 2 tablets every 3 hours as needed for severe pain 11/22/14  Yes Jerrol Banana., MD  promethazine (PHENERGAN) 25 MG suppository Place 1 suppository (25 mg total) rectally every 6 (six) hours as needed for nausea or vomiting.  04/02/15  Yes Kaprice Kage Maceo Pro., MD    Patient Active Problem List   Diagnosis Date Noted  . Contusion, back 12/26/2014  . Bruising 12/26/2014  . Back pain, chronic 11/22/2014  . Diabetes mellitus type 2 in obese ([place]) 11/22/2014  . 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 Acid reflux 11/22/2014  . HLD (hyperlipidemia) 11/22/2014  . BP (high blood pressure) 11/22/2014  . 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 Cannot sleep 11/22/2014  . Cirrhosis ([place]) 11/22/2014  . Adiposity 11/22/2014  . Change in blood platelet count 11/22/2014  . Avitaminosis D 11/22/2014  . Encephalopathy, hepatic ([place]) 05/23/2012  . 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  Social History   Social History  . Marital Status: Married    Spouse Name: Velva Harman  . Number of Children: 0  . Years of Education: college   Occupational History  . work at [employer]  . Smoking status: Never Smoker   . Smokeless tobacco: Never Used  . Alcohol Use: No  . Drug Use: No  . Sexual Activity: Not Currently   Other Topics Concern  . Not on file   Social History Narrative    Allergies  Allergen Reactions  . Latex     LATEX TAPE  . Pineapple Hives  Review of Systems  Constitutional: Positive for weight loss and malaise/fatigue.  Respiratory: Negative.   Cardiovascular: Negative.   Gastrointestinal: Positive for nausea.  Musculoskeletal: Positive for back pain and joint pain.  Neurological: Positive for weakness.    Immunization History  Administered Date(s) Administered  . Hepatitis A 09/05/2008, 03/10/2009  . Hepatitis B 09/05/2008, 10/07/2008, 03/10/2009   Objective:  BP 144/68 mmHg  Pulse 92  Temp(Src) 98.4 F (36.9 C)  Resp 14  Wt 308 lb (139.708 kg)  Physical Exam  Constitutional: He is oriented to person, place, and time and well-developed, well-nourished, and in no distress.  Morbidly obese WM NAD  HENT:  Head: Normocephalic and atraumatic.    Right Ear: External ear normal.  Left Ear: External ear normal.  Nose: Nose normal.  Eyes: Conjunctivae are normal.  Neck: Neck supple.  Cardiovascular: Normal rate, regular rhythm and normal heart sounds.   Pulmonary/Chest: Effort normal and breath sounds normal.  Abdominal: Soft.  Musculoskeletal: He exhibits no edema or tenderness.  Spasm of LS spine.  Neurological: He is alert and oriented to person, place, and time.  Skin: Skin is warm and dry. There is erythema.  Noninfected monilial rash R>L groin with satellite lesions.  Psychiatric: Mood, memory, affect and judgment normal.    Lab Results  Component Value Date   WBC 4.8 01/24/2015   HGB 15.2 09/30/2014   HCT 41.5 01/24/2015   PLT 70* 09/30/2014   GLUCOSE 310* 11/22/2014   CHOL 140 09/30/2014   TRIG 114 09/30/2014   HDL 42 09/30/2014   LDLCALC 105 09/30/2014   TSH 1.800 01/24/2015   HGBA1C 9.7* 01/24/2015    CMP     Component Value Date/Time   NA 136 11/22/2014 0944   NA 133* 06/11/2013 0902   K 4.3 11/22/2014 0944   K 4.1 06/11/2013 0902   CL 98 11/22/2014 0944   CL 101 06/11/2013 0902   CO2 22 11/22/2014 0944   CO2 26 06/11/2013 0902   GLUCOSE 310* 11/22/2014 0944   GLUCOSE 251* 06/11/2013 0902   BUN 12 11/22/2014 0944   BUN 13 06/11/2013 0902   CREATININE 0.94 11/22/2014 0944   CREATININE 1.0 09/30/2014   CREATININE 1.14 06/11/2013 0902   CALCIUM 8.8 11/22/2014 0944   CALCIUM 8.8 06/11/2013 0902   PROT 7.0 11/22/2014 0944   PROT 7.3 06/11/2013 0902   ALBUMIN 3.7 11/22/2014 0944   ALBUMIN 3.7 06/11/2013 0902   AST 31 11/22/2014 0944   AST 61* 06/11/2013 0902   ALT 26 11/22/2014 0944   ALT 45 06/11/2013 0902   ALKPHOS 186* 11/22/2014 0944   ALKPHOS 159* 06/11/2013 0902   BILITOT 1.6* 11/22/2014 0944   BILITOT 2.0* 06/11/2013 0902   GFRNONAA 92 11/22/2014 0944   GFRNONAA >60 06/11/2013 0902   GFRAA 107 11/22/2014 0944   GFRAA >60 06/11/2013 0902    Assessment and Plan :  1. Sciatica of  right side May require MRI if symptoms persist. - DG Lumbar Spine Complete; Future - predniSONE (STERAPRED UNI-PAK 48 TAB) 5 MG (48) TBPK tablet; Take 1 tablet (5 mg total) by mouth daily. As directed  Dispense: 48 tablet; Refill: 0  2. Essential hypertension  - CBC with Differential/Platelet - Comprehensive metabolic panel  3. Diabetes mellitus type 2 in obese (HCC)  - HgB A1c  4. Impotence of organic origin  - TSH  5. HLD (hyperlipidemia) 6.Yeast Dermatitis Refill fluconazole for now--may need derm referral to try to avoid potential hepatotoxicity of use  of antifungal tablets. 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 9.ED Try Sildenafil 1m,2-4 daily as needed.    RMiguel AschoffMD [facility] 05/21/2015 3:33 PM

## 2015-05-22 LAB — COMPREHENSIVE METABOLIC PANEL
ALT: 35 IU/L (ref 0–44)
AST: 39 IU/L (ref 0–40)
Albumin/Globulin Ratio: 1.1 (ref 1.1–2.5)
Albumin: 3.8 g/dL (ref 3.5–5.5)
Alkaline Phosphatase: 162 IU/L — ABNORMAL HIGH (ref 39–117)
BUN/Creatinine Ratio: 10 (ref 9–20)
BUN: 9 mg/dL (ref 6–24)
Bilirubin Total: 1.8 mg/dL — ABNORMAL HIGH (ref 0.0–1.2)
CO2: 25 mmol/L (ref 18–29)
Calcium: 8.8 mg/dL (ref 8.7–10.2)
Chloride: 98 mmol/L (ref 97–106)
Creatinine, Ser: 0.94 mg/dL (ref 0.76–1.27)
GFR calc Af Amer: 107 mL/min/{1.73_m2} (ref >59)
GFR calc non Af Amer: 92 mL/min/{1.73_m2} (ref >59)
Globulin, Total: 3.6 g/dL (ref 1.5–4.5)
Glucose: 363 mg/dL — ABNORMAL HIGH (ref 65–99)
Potassium: 4 mmol/L (ref 3.5–5.2)
Sodium: 138 mmol/L (ref 136–144)
Total Protein: 7.4 g/dL (ref 6.0–8.5)

## 2015-05-22 LAB — CBC WITH DIFFERENTIAL/PLATELET
Basophils Absolute: 0 10*3/uL (ref 0.0–0.2)
Basos: 1 %
EOS (ABSOLUTE): 0.3 10*3/uL (ref 0.0–0.4)
Eos: 5 %
Hematocrit: 40.4 % (ref 37.5–51.0)
Hemoglobin: 14.6 g/dL (ref 12.6–17.7)
Immature Grans (Abs): 0 10*3/uL (ref 0.0–0.1)
Immature Granulocytes: 0 %
Lymphocytes Absolute: 1.4 10*3/uL (ref 0.7–3.1)
Lymphs: 25 %
MCH: 33.1 pg — ABNORMAL HIGH (ref 26.6–33.0)
MCHC: 36.1 g/dL — ABNORMAL HIGH (ref 31.5–35.7)
MCV: 92 fL (ref 79–97)
Monocytes Absolute: 0.3 10*3/uL (ref 0.1–0.9)
Monocytes: 6 %
Neutrophils Absolute: 3.5 10*3/uL (ref 1.4–7.0)
Neutrophils: 63 %
Platelets: 58 10*3/uL — CL (ref 150–379)
RBC: 4.41 x10E6/uL (ref 4.14–5.80)
RDW: 14.4 % (ref 12.3–15.4)
WBC: 5.5 10*3/uL (ref 3.4–10.8)

## 2015-05-22 LAB — HEMOGLOBIN A1C
Est. average glucose Bld gHb Est-mCnc: 237 mg/dL
Hgb A1c MFr Bld: 9.9 % — ABNORMAL HIGH (ref 4.8–5.6)

## 2015-05-22 LAB — TSH: TSH: 1.86 u[IU]/mL (ref 0.450–4.500)

## 2015-05-27 ENCOUNTER — Telehealth: Payer: Self-pay | Admitting: Family Medicine

## 2015-05-27 NOTE — Telephone Encounter (Signed)
[facility] has questions for his rx on the diflucan 269m.  Please [facility] [phone]

## 2015-05-27 NOTE — Telephone Encounter (Signed)
This was an error. i advised [facility] to cancel that order-aa

## 2015-05-30 ENCOUNTER — Ambulatory Visit
Admission: RE | Admit: 2015-05-30 | Discharge: 2015-05-30 | Disposition: A | Discharge status: Home / Self Care | Payer: 59 | Source: Ambulatory Visit | Attending: Family Medicine | Admitting: Family Medicine

## 2015-05-30 DIAGNOSIS — M5431 Sciatica, right side: Secondary | ICD-10-CM

## 2015-05-30 DIAGNOSIS — N200 Calculus of kidney: Secondary | ICD-10-CM | POA: Insufficient documentation

## 2015-06-02 ENCOUNTER — Telehealth: Payer: Self-pay

## 2015-06-02 NOTE — Telephone Encounter (Signed)
Advised  ED 

## 2015-06-02 NOTE — Telephone Encounter (Signed)
-----   Message from Jerrol Banana., MD sent at 05/31/2015  9:36 AM EST ----- DDD

## 2015-06-04 ENCOUNTER — Ambulatory Visit: Payer: 59 | Admitting: Family Medicine

## 2015-06-12 ENCOUNTER — Other Ambulatory Visit: Payer: Self-pay | Admitting: Family Medicine

## 2015-06-12 ENCOUNTER — Encounter: Payer: Self-pay | Admitting: Family Medicine

## 2015-06-12 ENCOUNTER — Ambulatory Visit (INDEPENDENT_AMBULATORY_CARE_PROVIDER_SITE_OTHER): Payer: 59 | Admitting: Family Medicine

## 2015-06-12 VITALS — BP 138/56 | HR 106 | Temp 100.5°F | Resp 16 | Wt 313.5 lb

## 2015-06-12 DIAGNOSIS — N200 Calculus of kidney: Secondary | ICD-10-CM | POA: Diagnosis not present

## 2015-06-12 DIAGNOSIS — N3001 Acute cystitis with hematuria: Secondary | ICD-10-CM

## 2015-06-12 DIAGNOSIS — J069 Acute upper respiratory infection, unspecified: Secondary | ICD-10-CM

## 2015-06-12 LAB — POCT URINALYSIS DIPSTICK
Bilirubin, UA: NEGATIVE
Glucose, UA: 2
Nitrite, UA: POSITIVE
Spec Grav, UA: <=1.005
Urobilinogen, UA: 0.2
pH, UA: 5

## 2015-06-12 MED ORDER — LEVOFLOXACIN 500 MG PO TABS: 500.0000 mg | ORAL_TABLET | Freq: Every day | ORAL | Status: DC

## 2015-06-12 MED ORDER — HYDROCODONE-ACETAMINOPHEN 10-325 MG PO TABS: 1.0000 | ORAL_TABLET | Freq: Four times a day (QID) | ORAL | Status: DC | PRN

## 2015-06-12 NOTE — Progress Notes (Signed)
Subjective:     Patient ID: Jesse Zimmerman, male   DOB: 04/07/1961, 54 y.o.   MRN: [id number]  HPI  Chief Complaint  Patient presents with  . Back Pain    Patient comes in office today with complaints of lower left side back pain for the past 40hrs, patient reports that last night he had passed a kidney stone. He complains of the following symptoms : fever, frequency of urination, chills, back ache. Paitent states he has a history of passing stones  . Cough    Patient has concerns of cold like symptoms for the past 24-48hrs, patient complains of productive cough and sinus pain/pressure  States he had a few pain pills left. Accompanied by his wife today. Brings passed kidney stone from early this AM with him. Currently on Invokamet.   Review of Systems     Objective:   Physical Exam  Constitutional: He appears well-developed and well-nourished. No distress.  Ears: T.M's intact without inflammation Sinuses: non-tender Throat: tonsils absent, mild erythema Neck: no cervical adenopathy Lungs: clear     Assessment:    1. Acute cystitis with hematuria - POCT urinalysis dipstick - Urine culture - levofloxacin (LEVAQUIN) 500 MG tablet; Take 1 tablet (500 mg total) by mouth daily.  Dispense: 7 tablet; Refill: 0  2. Upper respiratory infection  3. Kidney stone - HYDROcodone-acetaminophen (NORCO) 10-325 MG tablet; Take 1 tablet by mouth every 6 (six) hours as needed.  Dispense: 40 tablet; Refill: 0    Plan:    Discussed use of Mucinex D for congestion, Delsym for cough, and Benadryl for postnasal drainage

## 2015-06-12 NOTE — Patient Instructions (Signed)
Discussed use of Mucinex D for congestion, Delsym for cough, and Benadryl for postnasal drainage

## 2015-06-18 LAB — URINE CULTURE

## 2015-06-19 ENCOUNTER — Telehealth: Payer: Self-pay

## 2015-06-19 NOTE — Telephone Encounter (Signed)
Patient advised as directed below. Patient states he feels much better, and he took his last antibiotic today.

## 2015-06-19 NOTE — Telephone Encounter (Signed)
-----   Message from Carmon Ginsberg, [place] sent at 06/19/2015  7:41 AM EST ----- No specific organism detected. Is he feeling better?

## 2015-07-17 ENCOUNTER — Ambulatory Visit: Payer: 59 | Admitting: Family Medicine

## 2016-01-26 ENCOUNTER — Ambulatory Visit (INDEPENDENT_AMBULATORY_CARE_PROVIDER_SITE_OTHER): Payer: 59 | Admitting: Family Medicine

## 2016-01-26 ENCOUNTER — Encounter: Payer: Self-pay | Admitting: Family Medicine

## 2016-01-26 VITALS — BP 142/70 | HR 92 | Temp 98.4°F | Resp 16 | Wt 306.2 lb

## 2016-01-26 DIAGNOSIS — J301 Allergic rhinitis due to pollen: Secondary | ICD-10-CM

## 2016-01-26 DIAGNOSIS — L02234 Carbuncle of groin: Secondary | ICD-10-CM | POA: Diagnosis not present

## 2016-01-26 MED ORDER — AMOXICILLIN-POT CLAVULANATE 875-125 MG PO TABS: 1.0000 | ORAL_TABLET | Freq: Two times a day (BID) | ORAL | 0 refills | Status: DC

## 2016-01-26 NOTE — Progress Notes (Signed)
Subjective:     Patient ID: Jesse Zimmerman, male   DOB: 1960/10/27, 55 y.o.   MRN: [id number]  HPI  Chief Complaint  Patient presents with  . Skin Problem    Patient comes in office today with concerns of a possible cyst that appeared on the skin 01/24/16. Patient states that cyst is on the right side of his groin and cyst had popped last night. Patient states that drainage was bloody and clear.   . Ear Fullness    Patient would like to address fullness in both ears for the past several days, associated with fulless he complains of cough and nasal drainage.   States symptoms started after he moved his mother back home from assisted living. Reports a lot of dust exposure. + hx of allergies. Has hx of groin surgery at [facility] in 2012 for severe cellulitis from Enterococcus. No MRSA.   Review of Systems     Objective:   Physical Exam  Constitutional: He appears well-developed and well-nourished. No distress.  Skin:  Right groin with deflated carbuncle-small amount of bloody drainage only. Minimally tender.  Ears: T.M's intact without inflammation Throat: no tonsillar enlargement or exudate Neck: no cervical adenopathy Lungs: clear     Assessment:    1. Carbuncle of groin: resolving after spontaneous drainage - amoxicillin-clavulanate (AUGMENTIN) 875-125 MG tablet; Take 1 tablet by mouth 2 (two) times daily.  Dispense: 20 tablet; Refill: 0  2. Allergic rhinitis due to pollen    Plan:    Discussed use of Claritin. Continue soap and water cleansing with dressing. Add abx if not continuing to improve.

## 2016-01-26 NOTE — Patient Instructions (Signed)
Add Claritin for allergy symptoms. Cleanse your boil area with soap and water and apply a dressing daily.

## 2016-04-08 ENCOUNTER — Ambulatory Visit (INDEPENDENT_AMBULATORY_CARE_PROVIDER_SITE_OTHER): Payer: 59 | Admitting: Family Medicine

## 2016-04-08 VITALS — BP 120/70 | HR 78 | Temp 97.8°F | Resp 16 | Wt 319.0 lb

## 2016-04-08 DIAGNOSIS — R059 Cough, unspecified: Secondary | ICD-10-CM

## 2016-04-08 DIAGNOSIS — R05 Cough: Secondary | ICD-10-CM | POA: Diagnosis not present

## 2016-04-08 MED ORDER — HYDROCOD POLST-CPM POLST ER 10-8 MG/5ML PO SUER: 5.0000 mL | Freq: Every evening | ORAL | 0 refills | Status: DC | PRN

## 2016-04-08 NOTE — Progress Notes (Signed)
Jesse Zimmerman  MRN: [id number] DOB: Nov 04, 1960  Subjective:  HPI  The patient is a 55 year old male who presents to have a form filled out to appeal the findings of his health plan at [place].  He is appealing due to the BMI.    He also has cold symptoms that started 1 week ago.  He has head congestion and cough.    The patient states he past a kidney stone on Sunday, 4 days ago.  Prior to that he had a temperature of 104.0, nausea and vomiting.  Once he passed the stone he began to feel better except for the cold symptoms.  Patient Active Problem List   Diagnosis Date Noted  . Contusion, back 12/26/2014  . Bruising 12/26/2014  . Back pain, chronic 11/22/2014  . Diabetes mellitus type 2 in obese ([place]) 11/22/2014  . 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 Acid reflux 11/22/2014  . HLD (hyperlipidemia) 11/22/2014  . BP (high blood pressure) 11/22/2014  . 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 Cannot sleep 11/22/2014  . Cirrhosis ([place]) 11/22/2014  . Adiposity 11/22/2014  . Change in blood platelet count 11/22/2014  . Avitaminosis D 11/22/2014  . Encephalopathy, hepatic ([place]) 05/23/2012  . 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 Chronic back pain   . Cirrhosis ([place])   . Diabetes mellitus without complication ([place])   . Hyperlipidemia     Social History   Social History  . Marital status: Married    Spouse name: Velva Harman  . Number of children: 0  . Years of education: college   Occupational History  . work at [employer]  . Smoking status: Never Smoker  . Smokeless tobacco: Never Used  . Alcohol use No  . Drug use: No  . Sexual activity: Not Currently   Other Topics Concern  . Not on file   Social History Narrative  . No narrative on file    Outpatient Encounter Prescriptions as of 04/08/2016  Medication Sig Note  . CANAGLIFLOZIN PO Take by mouth. 06/12/2015: Received from: [facility]  .  furosemide (LASIX) 20 MG tablet Take by mouth. 11/22/2014: Medication taken as needed.  Received from: [facility]  . nadolol (CORGARD) 20 MG tablet Take by mouth. 11/22/2014: Received from: [facility]  . promethazine (PHENERGAN) 25 MG suppository Place 1 suppository (25 mg total) rectally every 6 (six) hours as needed for nausea or vomiting.   . sildenafil (REVATIO) 20 MG tablet 2 to 4 tablets daily as needed   . [DISCONTINUED] INVOKAMET 50-1000 MG TABS Take 1 tablet by mouth two  times daily   . [DISCONTINUED] amoxicillin-clavulanate (AUGMENTIN) 875-125 MG tablet Take 1 tablet by mouth 2 (two) times daily.   . [DISCONTINUED] diazepam (VALIUM) 5 MG tablet Take by mouth. 11/22/2014: Received from: [facility]  . [DISCONTINUED] ergocalciferol (VITAMIN D2) 50000 UNITS capsule Take by mouth. 11/22/2014: Received from: [facility]  . [DISCONTINUED] HYDROcodone-acetaminophen (NORCO) 10-325 MG tablet Take 1 tablet by mouth every 6 (six) hours as needed. (Patient not taking: Reported on 01/26/2016)   . [DISCONTINUED] levofloxacin (LEVAQUIN) 500 MG tablet Take 1 tablet (500 mg total) by mouth daily. (Patient not taking: Reported on 01/26/2016)   . [DISCONTINUED] oxycodone (OXY-IR) 5 MG capsule 1  To 2 tablets every 3 hours as needed for severe pain (Patient not taking: Reported on 01/26/2016)    No facility-administered encounter  medications on file as of 04/08/2016.     Allergies  Allergen Reactions  . Latex     LATEX TAPE  . Pineapple Hives    Review of Systems  Constitutional: Positive for malaise/fatigue. Negative for chills and fever.  HENT: Positive for congestion and sore throat (raw from drainage). Negative for ear discharge, ear pain (Fullness), hearing loss, nosebleeds and tinnitus.   Eyes: Negative for blurred vision, double vision, photophobia, pain, discharge and redness.  Respiratory: Positive for cough and sputum production.  Negative for shortness of breath and wheezing.   Cardiovascular: Positive for leg swelling. Negative for chest pain, palpitations and orthopnea.  Gastrointestinal: Negative for abdominal pain, blood in stool, constipation, diarrhea, heartburn, melena, nausea and vomiting.  Genitourinary: Negative for dysuria, flank pain, frequency, hematuria and urgency.  Neurological: Positive for dizziness and weakness. Negative for headaches.  Psychiatric/Behavioral: Negative.    Objective:  BP 120/70 (BP Location: Right Arm, Patient Position: Sitting, Cuff Size: Large)   Pulse 78   Temp 97.8 F (36.6 C) (Oral)   Resp 16   Wt (!) 319 lb (144.7 kg)   BMI 43.26 kg/m   Physical Exam  Constitutional: He is oriented to person, place, and time and well-developed, well-nourished, and in no distress.  Pleasant, cooperative, morbidly obese white male in no acute distress  HENT:  Head: Normocephalic and atraumatic.  Eyes: Conjunctivae are normal. No scleral icterus.  Neck: No thyromegaly present.  Cardiovascular: Normal rate, regular rhythm and normal heart sounds.   Pulmonary/Chest: Effort normal and breath sounds normal.  Abdominal: Soft.  Lymphadenopathy:    He has no cervical adenopathy.  Neurological: He is alert and oriented to person, place, and time. Gait normal.  Skin: Skin is warm and dry.  Psychiatric: Mood, memory, affect and judgment normal.    Assessment and Plan :  Morbid obesity Patient was 450 pounds several years ago. His mother died recently and he and his wife are both ready to work on diet and exercise. Goal over the next year is to get below 300 pound with ultimate goal of waist size of less than 36 inches. NASH I have done the exam and reviewed the chart and it is accurate to the best of my knowledge. Miguel Aschoff M.D. [facility]

## 2016-04-30 ENCOUNTER — Encounter: Payer: Self-pay | Admitting: Family Medicine

## 2016-04-30 ENCOUNTER — Ambulatory Visit (INDEPENDENT_AMBULATORY_CARE_PROVIDER_SITE_OTHER): Payer: 59 | Admitting: Family Medicine

## 2016-04-30 VITALS — BP 142/84 | HR 64 | Temp 97.5°F | Resp 16 | Wt 320.0 lb

## 2016-04-30 DIAGNOSIS — N200 Calculus of kidney: Secondary | ICD-10-CM | POA: Diagnosis not present

## 2016-04-30 DIAGNOSIS — R600 Localized edema: Secondary | ICD-10-CM | POA: Diagnosis not present

## 2016-04-30 DIAGNOSIS — B372 Candidiasis of skin and nail: Secondary | ICD-10-CM | POA: Diagnosis not present

## 2016-04-30 MED ORDER — FLUCONAZOLE 200 MG PO TABS: ORAL_TABLET | ORAL | 5 refills | Status: DC

## 2016-04-30 MED ORDER — FUROSEMIDE 20 MG PO TABS: ORAL_TABLET | ORAL | 5 refills | Status: DC

## 2016-04-30 NOTE — Progress Notes (Signed)
Subjective:     Patient ID: Jesse Zimmerman, male   DOB: Nov 07, 1960, 55 y.o.   MRN: [id number]  HPI  Chief Complaint  Patient presents with  . Edema    x 3-4 weeks. Intermittent. Does have 4 kidney stones, and is concerned this may be causing the sx. Was found by CT scan. Had to fly to [facility], and walked a lot in the city. Pt reports the left foot was swollen "four times" it's original size.  . Other    Pt also reports he has a H/O yeast infections due to liver cirrhosis. Is not currently experiencing sx, but is requesting refill on Diflucan 200 mg tablets.  Reports compliance with furosemide but forgot it on his recent trip. Concerned that one his residual stones may be obstructing. Accompanied by his wife today. Also remains on Invokana. GFR 01/30/16: 99   Review of Systems  Respiratory: Negative for shortness of breath.        Objective:   Physical Exam  Constitutional: He appears well-developed and well-nourished. No distress.  Pulmonary/Chest: Breath sounds normal.  Musculoskeletal: He exhibits edema (bilateral pedal).       Assessment:    1. Pedal edema - Renal function panel - furosemide (LASIX) 20 MG tablet; 1-2 pills daily  Dispense: 60 tablet; Refill: 5  2. Recurrent kidney stones   3. Candidiasis of skin - fluconazole (DIFLUCAN) 200 MG tablet; One pill daily x 5 days as needed for recurrence of yeast infections  Dispense: 5 tablet; Refill: 5    Plan:    He is to temporarily increase his furosemide to 40 mg.daily. Consider KUB if not improving. Further f/u pending lab work.

## 2016-04-30 NOTE — Patient Instructions (Signed)
We will call you with the lab results. Increase furosemide to 40 mg. Daily.

## 2016-05-01 LAB — RENAL FUNCTION PANEL
Albumin: 3.5 g/dL (ref 3.5–5.5)
BUN/Creatinine Ratio: 15 (ref 9–20)
BUN: 14 mg/dL (ref 6–24)
CO2: 26 mmol/L (ref 18–29)
Calcium: 8.7 mg/dL (ref 8.7–10.2)
Chloride: 102 mmol/L (ref 96–106)
Creatinine, Ser: 0.93 mg/dL (ref 0.76–1.27)
GFR calc Af Amer: 107 mL/min/{1.73_m2} (ref >59)
GFR calc non Af Amer: 93 mL/min/{1.73_m2} (ref >59)
Glucose: 244 mg/dL — ABNORMAL HIGH (ref 65–99)
Phosphorus: 3.6 mg/dL (ref 2.5–4.5)
Potassium: 4.2 mmol/L (ref 3.5–5.2)
Sodium: 141 mmol/L (ref 134–144)

## 2016-05-03 ENCOUNTER — Telehealth: Payer: Self-pay

## 2016-05-03 NOTE — Telephone Encounter (Signed)
[id number] 

## 2016-05-03 NOTE — Telephone Encounter (Signed)
-----   Message from Carmon Ginsberg, [place] sent at 05/03/2016  7:35 AM EST ----- Kidney function is great.

## 2016-05-04 NOTE — Telephone Encounter (Signed)
Patient has been advised. KW 

## 2016-07-02 ENCOUNTER — Ambulatory Visit (INDEPENDENT_AMBULATORY_CARE_PROVIDER_SITE_OTHER): Payer: 59 | Admitting: Physician Assistant

## 2016-07-02 ENCOUNTER — Ambulatory Visit: Admit: 2016-07-02 | Payer: 59 | Admitting: Unknown Physician Specialty

## 2016-07-02 DIAGNOSIS — Z23 Encounter for immunization: Secondary | ICD-10-CM

## 2016-07-02 SURGERY — COLONOSCOPY WITH PROPOFOL
Anesthesia: General

## 2016-08-09 ENCOUNTER — Ambulatory Visit: Admit: 2016-08-09 | Payer: 59 | Admitting: Unknown Physician Specialty

## 2016-08-09 SURGERY — EGD (ESOPHAGOGASTRODUODENOSCOPY)
Anesthesia: General

## 2016-11-03 ENCOUNTER — Ambulatory Visit (INDEPENDENT_AMBULATORY_CARE_PROVIDER_SITE_OTHER): Payer: 59 | Admitting: Family Medicine

## 2016-11-03 ENCOUNTER — Encounter: Payer: Self-pay | Admitting: Family Medicine

## 2016-11-03 ENCOUNTER — Ambulatory Visit
Admission: RE | Admit: 2016-11-03 | Discharge: 2016-11-03 | Disposition: A | Discharge status: Home / Self Care | Payer: 59 | Source: Ambulatory Visit | Attending: Family Medicine | Admitting: Family Medicine

## 2016-11-03 ENCOUNTER — Other Ambulatory Visit: Payer: Self-pay | Admitting: Family Medicine

## 2016-11-03 VITALS — BP 120/70 | HR 82 | Temp 98.3°F | Resp 16 | Wt 317.0 lb

## 2016-11-03 DIAGNOSIS — R059 Cough, unspecified: Secondary | ICD-10-CM

## 2016-11-03 DIAGNOSIS — R05 Cough: Secondary | ICD-10-CM | POA: Insufficient documentation

## 2016-11-03 DIAGNOSIS — J189 Pneumonia, unspecified organism: Secondary | ICD-10-CM

## 2016-11-03 DIAGNOSIS — R5383 Other fatigue: Secondary | ICD-10-CM

## 2016-11-03 DIAGNOSIS — K7469 Other cirrhosis of liver: Secondary | ICD-10-CM | POA: Diagnosis not present

## 2016-11-03 DIAGNOSIS — R509 Fever, unspecified: Secondary | ICD-10-CM | POA: Diagnosis not present

## 2016-11-03 MED ORDER — AZITHROMYCIN 250 MG PO TABS: ORAL_TABLET | ORAL | 0 refills | Status: DC

## 2016-11-03 NOTE — Progress Notes (Signed)
Subjective:  HPI Pt is here today for fever, cough (for 5 weeks), fatigue, weakness, diarrhea, and vomiting. He reports he has been tired for a couple of weeks and coughing but he thought it was allergies. Then yesterday it hit him with a fever of 101 and nausea, vomiting and diarrhea. He has boil on the right side of his upper leg/ groin area. He reports that he just feels drained.   Prior to Admission medications   Medication Sig Start Date End Date Taking? Authorizing Provider  CANAGLIFLOZIN PO Take by mouth.    [provider]  fluconazole (DIFLUCAN) 200 MG tablet One pill daily x 5 days as needed for recurrence of yeast infections 04/30/16   Carmon Ginsberg, PA  furosemide (LASIX) 20 MG tablet 1-2 pills daily 04/30/16   Carmon Ginsberg, PA  nadolol (CORGARD) 20 MG tablet Take by mouth. 07/26/12   [provider]  promethazine (PHENERGAN) 25 MG suppository Place 1 suppository (25 mg total) rectally every 6 (six) hours as needed for nausea or vomiting. 04/02/15   Jerrol Banana., MD  sildenafil (REVATIO) 20 MG tablet 2 to 4 tablets daily as needed Patient not taking: Reported on 04/30/2016 05/21/15   Jerrol Banana., MD    Patient Active Problem List   Diagnosis Date Noted  . Contusion, back 12/26/2014  . Bruising 12/26/2014  . Back pain, chronic 11/22/2014  . Diabetes mellitus type 2 in obese ([place]) 11/22/2014  . 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 Acid reflux 11/22/2014  . HLD (hyperlipidemia) 11/22/2014  . BP (high blood pressure) 11/22/2014  . 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 Cannot sleep 11/22/2014  . Cirrhosis ([place]) 11/22/2014  . Adiposity 11/22/2014  . Change in blood platelet count 11/22/2014  . Avitaminosis D 11/22/2014  . Encephalopathy, hepatic ([place]) 05/23/2012  . 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 Chronic back pain   . Cirrhosis ([place])   . Diabetes mellitus without complication ([place])   .  Hyperlipidemia     Social History   Social History  . Marital status: Married    Spouse name: Velva Harman  . Number of children: 0  . Years of education: college   Occupational History  . work at [employer]  . Smoking status: Never Smoker  . Smokeless tobacco: Never Used  . Alcohol use No  . Drug use: No  . Sexual activity: Not Currently   Other Topics Concern  . Not on file   Social History Narrative  . No narrative on file    Allergies  Allergen Reactions  . Latex     LATEX TAPE  . Pineapple Hives    Review of Systems  Constitutional: Positive for fever and malaise/fatigue.  HENT: Positive for congestion and ear pain.        Post nasal drainage  Eyes: Negative.   Respiratory: Positive for cough.   Cardiovascular: Negative.   Gastrointestinal: Positive for diarrhea, nausea and vomiting.  Genitourinary: Negative.   Musculoskeletal: Positive for back pain.  Skin: Negative.   Neurological: Positive for weakness.  Endo/Heme/Allergies: Negative.   Psychiatric/Behavioral: Negative.     Immunization History  Administered Date(s) Administered  . Hepatitis A 09/05/2008, 03/10/2009  . Hepatitis B 09/05/2008, 10/07/2008, 03/10/2009  . Influenza,inj,Quad PF,36+ Mos 07/02/2016    Objective:  BP 120/70 (BP Location: Left Arm, Patient Position: Sitting, Cuff Size: Large)   Pulse 82   Temp  98.3 F (36.8 C) (Oral)   Resp 16   Wt (!) 317 lb (143.8 kg)   SpO2 98%   BMI 42.99 kg/m   Physical Exam  Constitutional: He is oriented to person, place, and time and well-developed, well-nourished, and in no distress.  HENT:  Head: Normocephalic and atraumatic.  Right Ear: External ear normal.  Left Ear: External ear normal.  Nose: Nose normal.  Mouth/Throat: Oropharynx is clear and moist.  Eyes: Conjunctivae and EOM are normal. Pupils are equal, round, and reactive to light.  Neck: Normal range of motion. Neck supple.  Cardiovascular: Normal  rate, regular rhythm, normal heart sounds and intact distal pulses.   Pulmonary/Chest: Effort normal and breath sounds normal.  Abdominal: Soft. Bowel sounds are normal.  Healing boil in the right groin  Musculoskeletal: Normal range of motion. He exhibits edema (trace).  Neurological: He is alert and oriented to person, place, and time. He has normal reflexes. Gait normal. GCS score is 15.  Skin: Skin is warm and dry.  Psychiatric: Mood, memory, affect and judgment normal.    Lab Results  Component Value Date   WBC 5.5 05/21/2015   HGB 15.2 09/30/2014   HCT 40.4 05/21/2015   PLT 58 (LL) 05/21/2015   GLUCOSE 244 (H) 04/30/2016   CHOL 140 09/30/2014   TRIG 114 09/30/2014   HDL 42 09/30/2014   LDLCALC 105 09/30/2014   TSH 1.860 05/21/2015   HGBA1C 9.9 (H) 05/21/2015    CMP     Component Value Date/Time   NA 141 04/30/2016 1240   NA 133 (L) 06/11/2013 0902   K 4.2 04/30/2016 1240   K 4.1 06/11/2013 0902   CL 102 04/30/2016 1240   CL 101 06/11/2013 0902   CO2 26 04/30/2016 1240   CO2 26 06/11/2013 0902   GLUCOSE 244 (H) 04/30/2016 1240   GLUCOSE 251 (H) 06/11/2013 0902   BUN 14 04/30/2016 1240   BUN 13 06/11/2013 0902   CREATININE 0.93 04/30/2016 1240   CREATININE 1.14 06/11/2013 0902   CALCIUM 8.7 04/30/2016 1240   CALCIUM 8.8 06/11/2013 0902   PROT 7.4 05/21/2015 1636   PROT 7.3 06/11/2013 0902   ALBUMIN 3.5 04/30/2016 1240   ALBUMIN 3.7 06/11/2013 0902   AST 39 05/21/2015 1636   AST 61 (H) 06/11/2013 0902   ALT 35 05/21/2015 1636   ALT 45 06/11/2013 0902   ALKPHOS 162 (H) 05/21/2015 1636   ALKPHOS 159 (H) 06/11/2013 0902   BILITOT 1.8 (H) 05/21/2015 1636   BILITOT 2.0 (H) 06/11/2013 0902   GFRNONAA 93 04/30/2016 1240   GFRNONAA >60 06/11/2013 0902   GFRAA 107 04/30/2016 1240   GFRAA >60 06/11/2013 0902    Assessment and Plan :  1. Walking pneumonia  - azithromycin (ZITHROMAX) 250 MG tablet; Take 2 tablets the first day then one daily until finished.   Dispense: 6 each; Refill: 0  2. Fever, unspecified fever cause  - CBC with Differential/Platelet  3. Cough  - DG Chest 2 View; Future  4. Other cirrhosis of liver ([place]) Could be progression of disease. - Comprehensive metabolic panel  5. Other fatigue  - CBC with Differential/Platelet - TSH - Sedimentation rate   HPI, Exam, and A&P Transcribed under the direction and in the presence of Betzabe Bevans L. Cranford Mon, MD  Electronically Signed: Katina Dung, CMA I have done the exam and reviewed the above chart and it is accurate to the best of my knowledge. Development worker, community has been  used in this note in any air is in the dictation or transcription are unintentional.  [facility] 11/03/2016 3:54 PM

## 2016-11-04 ENCOUNTER — Telehealth: Payer: Self-pay

## 2016-11-04 LAB — CBC WITH DIFFERENTIAL/PLATELET
Basophils Absolute: 0 10*3/uL (ref 0.0–0.2)
Basos: 0 %
EOS (ABSOLUTE): 0.2 10*3/uL (ref 0.0–0.4)
Eos: 3 %
Hematocrit: 40.2 % (ref 37.5–51.0)
Hemoglobin: 14.1 g/dL (ref 13.0–17.7)
Immature Grans (Abs): 0 10*3/uL (ref 0.0–0.1)
Immature Granulocytes: 0 %
Lymphocytes Absolute: 1.3 10*3/uL (ref 0.7–3.1)
Lymphs: 19 %
MCH: 32.8 pg (ref 26.6–33.0)
MCHC: 35.1 g/dL (ref 31.5–35.7)
MCV: 94 fL (ref 79–97)
Monocytes Absolute: 0.7 10*3/uL (ref 0.1–0.9)
Monocytes: 11 %
Neutrophils Absolute: 4.6 10*3/uL (ref 1.4–7.0)
Neutrophils: 67 %
Platelets: 51 10*3/uL — CL (ref 150–379)
RBC: 4.3 x10E6/uL (ref 4.14–5.80)
RDW: 15.4 % (ref 12.3–15.4)
WBC: 6.8 10*3/uL (ref 3.4–10.8)

## 2016-11-04 LAB — COMPREHENSIVE METABOLIC PANEL
ALT: 26 IU/L (ref 0–44)
AST: 31 IU/L (ref 0–40)
Albumin/Globulin Ratio: 1.1 — ABNORMAL LOW (ref 1.2–2.2)
Albumin: 3.4 g/dL — ABNORMAL LOW (ref 3.5–5.5)
Alkaline Phosphatase: 97 IU/L (ref 39–117)
BUN/Creatinine Ratio: 11 (ref 9–20)
BUN: 14 mg/dL (ref 6–24)
Bilirubin Total: 2.5 mg/dL — ABNORMAL HIGH (ref 0.0–1.2)
CO2: 25 mmol/L (ref 18–29)
Calcium: 8.7 mg/dL (ref 8.7–10.2)
Chloride: 101 mmol/L (ref 96–106)
Creatinine, Ser: 1.28 mg/dL — ABNORMAL HIGH (ref 0.76–1.27)
GFR calc Af Amer: 72 mL/min/{1.73_m2} (ref >59)
GFR calc non Af Amer: 63 mL/min/{1.73_m2} (ref >59)
Globulin, Total: 3 g/dL (ref 1.5–4.5)
Glucose: 337 mg/dL — ABNORMAL HIGH (ref 65–99)
Potassium: 4.1 mmol/L (ref 3.5–5.2)
Sodium: 137 mmol/L (ref 134–144)
Total Protein: 6.4 g/dL (ref 6.0–8.5)

## 2016-11-04 LAB — SEDIMENTATION RATE: Sed Rate: 3 mm/hr (ref 0–30)

## 2016-11-04 LAB — VITAMIN D 25 HYDROXY (VIT D DEFICIENCY, FRACTURES): Vit D, 25-Hydroxy: 13 ng/mL — ABNORMAL LOW (ref 30.0–100.0)

## 2016-11-04 LAB — TSH: TSH: 2.49 u[IU]/mL (ref 0.450–4.500)

## 2016-11-04 NOTE — Telephone Encounter (Signed)
Advised pt of lab results. Pt verbally acknowledges understanding. Jesse Zimmerman, CMA   

## 2016-11-04 NOTE — Telephone Encounter (Signed)
-----   Message from Jerrol Banana., MD sent at 11/04/2016  2:27 PM EDT ----- Labs stable. Keep follow-up appointment.

## 2016-11-10 ENCOUNTER — Encounter: Payer: Self-pay | Admitting: Family Medicine

## 2016-11-10 ENCOUNTER — Ambulatory Visit (INDEPENDENT_AMBULATORY_CARE_PROVIDER_SITE_OTHER): Payer: 59 | Admitting: Family Medicine

## 2016-11-10 ENCOUNTER — Telehealth: Payer: Self-pay | Admitting: Family Medicine

## 2016-11-10 VITALS — BP 112/60 | HR 74 | Temp 97.7°F | Resp 16 | Wt 320.0 lb

## 2016-11-10 DIAGNOSIS — E559 Vitamin D deficiency, unspecified: Secondary | ICD-10-CM

## 2016-11-10 DIAGNOSIS — R600 Localized edema: Secondary | ICD-10-CM

## 2016-11-10 DIAGNOSIS — K7469 Other cirrhosis of liver: Secondary | ICD-10-CM | POA: Diagnosis not present

## 2016-11-10 DIAGNOSIS — R05 Cough: Secondary | ICD-10-CM | POA: Diagnosis not present

## 2016-11-10 DIAGNOSIS — R5383 Other fatigue: Secondary | ICD-10-CM

## 2016-11-10 DIAGNOSIS — B372 Candidiasis of skin and nail: Secondary | ICD-10-CM

## 2016-11-10 DIAGNOSIS — R059 Cough, unspecified: Secondary | ICD-10-CM

## 2016-11-10 MED ORDER — FLUCONAZOLE 150 MG PO TABS: ORAL_TABLET | ORAL | 2 refills | Status: DC

## 2016-11-10 MED ORDER — FUROSEMIDE 20 MG PO TABS
ORAL_TABLET | ORAL | 1 refills | Status: DC
Start: 2016-11-10 — End: 2018-06-08

## 2016-11-10 MED ORDER — VITAMIN D (ERGOCALCIFEROL) 1.25 MG (50000 UNIT) PO CAPS: 50000.0000 [IU] | ORAL_CAPSULE | ORAL | 1 refills | Status: DC

## 2016-11-10 NOTE — Telephone Encounter (Signed)
[facility] calling cause the pt had called there at the [facility] to told them he was having black stools.Shirlean Mylar called here to inform Dr that when pt comes in for his appointment that the pt has been having black stools resent.  Thanks CC

## 2016-11-10 NOTE — Progress Notes (Signed)
Subjective:  HPI Pt is here today for a 1 week follow up of possible walking pneumonia. He was having a fever, cough and not feeling well. Treated with Zpak. CXR normal. CBC normal. He reports that he is feeling better but still not feeling himself. He is just drained.   [facility] called over to let us know that pt called over to their office saying that he had some black stools and wanted to give [place] a heads up. Pt reports that he has noticed black, loose stools  for the last 48 hours. He reports that he has had changes in bowel movements also he has been going more but only a little each time. Appetite is normal. No nausea. No stomach pain other than last week with general stomach tenderness but after taking the antibiotics this got better. He has not eaten anything the last couple of days that may would have caused his stool to be black.   Pt would like refills on furosemide, fluconazole. He gets "heat rash" on his legs and take fluconazole and goes away. The creams do not work for him.    Prior to Admission medications   Medication Sig Start Date End Date Taking? Authorizing Provider  azithromycin (ZITHROMAX) 250 MG tablet Take 2 tablets the first day then one daily until finished. 11/03/16   Jerrol Banana., MD  CANAGLIFLOZIN PO Take by mouth. invokana    [provider]  fluconazole (DIFLUCAN) 200 MG tablet One pill daily x 5 days as needed for recurrence of yeast infections Patient not taking: Reported on 11/03/2016 04/30/16   Carmon Ginsberg, PA  furosemide (LASIX) 20 MG tablet 1-2 pills daily 04/30/16   Carmon Ginsberg, PA  nadolol (CORGARD) 20 MG tablet Take by mouth. 07/26/12   [provider]  promethazine (PHENERGAN) 25 MG suppository Place 1 suppository (25 mg total) rectally every 6 (six) hours as needed for nausea or vomiting. 04/02/15   Jerrol Banana., MD  sildenafil (REVATIO) 20 MG tablet 2 to 4 tablets daily as needed Patient not  taking: Reported on 04/30/2016 05/21/15   Jerrol Banana., MD    Patient Active Problem List   Diagnosis Date Noted  . Contusion, back 12/26/2014  . Bruising 12/26/2014  . Back pain, chronic 11/22/2014  . Diabetes mellitus type 2 in obese ([place]) 11/22/2014  . 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 Acid reflux 11/22/2014  . HLD (hyperlipidemia) 11/22/2014  . BP (high blood pressure) 11/22/2014  . 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 Cannot sleep 11/22/2014  . Cirrhosis ([place]) 11/22/2014  . Adiposity 11/22/2014  . Change in blood platelet count 11/22/2014  . Avitaminosis D 11/22/2014  . Encephalopathy, hepatic ([place]) 05/23/2012  . 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 Chronic back pain   . Cirrhosis ([place])   . Diabetes mellitus without complication ([place])   . Hyperlipidemia     Social History   Social History  . Marital status: Married    Spouse name: Velva Harman  . Number of children: 0  . Years of education: college   Occupational History  . work at [employer] Topics  . Smoking status: Never Smoker  . Smokeless tobacco: Never Used  . Alcohol use No  . Drug use: No  . Sexual activity: Not Currently   Other Topics Concern  . Not on file   Social History Narrative  . No narrative  on file    Allergies  Allergen Reactions  . Latex     LATEX TAPE  . Pineapple Hives    Review of Systems  Constitutional: Positive for malaise/fatigue.  HENT: Negative.   Eyes: Negative.   Respiratory: Negative.   Cardiovascular: Negative.   Gastrointestinal: Positive for melena.  Genitourinary: Negative.   Musculoskeletal: Negative.   Skin: Negative.   Neurological: Negative.   Endo/Heme/Allergies: Negative.   Psychiatric/Behavioral: Negative.     Immunization History  Administered Date(s) Administered  . Hepatitis A 09/05/2008, 03/10/2009  . Hepatitis B 09/05/2008, 10/07/2008, 03/10/2009  . Influenza,inj,Quad PF,36+ Mos  07/02/2016    Objective:  BP 112/60 (BP Location: Left Arm, Patient Position: Sitting, Cuff Size: Large)   Pulse 74   Temp 97.7 F (36.5 C) (Oral)   Resp 16   Wt (!) 320 lb (145.2 kg)   SpO2 97%   BMI 43.40 kg/m   Physical Exam  Constitutional: He is oriented to person, place, and time and well-developed, well-nourished, and in no distress.  HENT:  Head: Normocephalic and atraumatic.  Right Ear: External ear normal.  Left Ear: External ear normal.  Nose: Nose normal.  Eyes: Conjunctivae are normal. No scleral icterus.  Neck: No thyromegaly present.  Cardiovascular: Normal rate, regular rhythm, normal heart sounds and intact distal pulses.   Pulmonary/Chest: Effort normal and breath sounds normal.  Abdominal: Soft. Bowel sounds are normal.  Musculoskeletal: He exhibits edema (1+).  Neurological: He is alert and oriented to person, place, and time. He has normal reflexes. Gait normal. GCS score is 15.  Skin: Skin is warm and dry.  Psychiatric: Mood, memory, affect and judgment normal.    Lab Results  Component Value Date   WBC 6.8 11/03/2016   HGB 15.2 09/30/2014   HCT 40.2 11/03/2016   PLT 51 (LL) 11/03/2016   GLUCOSE 337 (H) 11/03/2016   CHOL 140 09/30/2014   TRIG 114 09/30/2014   HDL 42 09/30/2014   LDLCALC 105 09/30/2014   TSH 2.490 11/03/2016   HGBA1C 9.9 (H) 05/21/2015    CMP     Component Value Date/Time   NA 137 11/03/2016 1626   NA 133 (L) 06/11/2013 0902   K 4.1 11/03/2016 1626   K 4.1 06/11/2013 0902   CL 101 11/03/2016 1626   CL 101 06/11/2013 0902   CO2 25 11/03/2016 1626   CO2 26 06/11/2013 0902   GLUCOSE 337 (H) 11/03/2016 1626   GLUCOSE 251 (H) 06/11/2013 0902   BUN 14 11/03/2016 1626   BUN 13 06/11/2013 0902   CREATININE 1.28 (H) 11/03/2016 1626   CREATININE 1.14 06/11/2013 0902   CALCIUM 8.7 11/03/2016 1626   CALCIUM 8.8 06/11/2013 0902   PROT 6.4 11/03/2016 1626   PROT 7.3 06/11/2013 0902   ALBUMIN 3.4 (L) 11/03/2016 1626   ALBUMIN  3.7 06/11/2013 0902   AST 31 11/03/2016 1626   AST 61 (H) 06/11/2013 0902   ALT 26 11/03/2016 1626   ALT 45 06/11/2013 0902   ALKPHOS 97 11/03/2016 1626   ALKPHOS 159 (H) 06/11/2013 0902   BILITOT 2.5 (H) 11/03/2016 1626   BILITOT 2.0 (H) 06/11/2013 0902   GFRNONAA 63 11/03/2016 1626   GFRNONAA >60 06/11/2013 0902   GFRAA 72 11/03/2016 1626   GFRAA >60 06/11/2013 0902    Assessment and Plan :  1. Vitamin D deficiency  - Vitamin D, Ergocalciferol, (DRISDOL) 50000 units CAPS capsule; Take 1 capsule (50,000 Units total) by mouth every 7 (seven) days.  Dispense: 12 capsule; Refill: 1  2. Cough   3. Other cirrhosis of liver ([facility])   4. Other fatigue   5. Pedal edema  - furosemide (LASIX) 20 MG tablet; 1-2 pills daily  Dispense: 180 tablet; Refill: 1  6. Candidiasis of skin  - fluconazole (DIFLUCAN) 150 MG tablet; One pill daily x 3 days as needed for recurrence of yeast infections  Dispense: 3 tablet; Refill: 2 7.Morbid Obesity   HPI, Exam, and A&P Transcribed under the direction and in the presence of Richard L. Cranford Mon, MD  Electronically Signed: Katina Dung, [place] MD [facility] 11/10/2016 3:08 PM

## 2016-11-10 NOTE — Telephone Encounter (Signed)
See below-aa

## 2016-11-11 ENCOUNTER — Ambulatory Visit: Payer: 59 | Admitting: Family Medicine

## 2017-02-03 ENCOUNTER — Ambulatory Visit: Payer: 59 | Admitting: Family Medicine

## 2017-02-03 ENCOUNTER — Ambulatory Visit (INDEPENDENT_AMBULATORY_CARE_PROVIDER_SITE_OTHER): Payer: 59 | Admitting: Family Medicine

## 2017-02-03 VITALS — BP 124/62 | HR 74 | Temp 97.6°F | Resp 14 | Wt 310.0 lb

## 2017-02-03 DIAGNOSIS — E669 Obesity, unspecified: Secondary | ICD-10-CM

## 2017-02-03 DIAGNOSIS — N200 Calculus of kidney: Secondary | ICD-10-CM

## 2017-02-03 DIAGNOSIS — I10 Essential (primary) hypertension: Secondary | ICD-10-CM

## 2017-02-03 DIAGNOSIS — R5383 Other fatigue: Secondary | ICD-10-CM

## 2017-02-03 DIAGNOSIS — G4709 Other insomnia: Secondary | ICD-10-CM

## 2017-02-03 DIAGNOSIS — N528 Other male erectile dysfunction: Secondary | ICD-10-CM

## 2017-02-03 DIAGNOSIS — E559 Vitamin D deficiency, unspecified: Secondary | ICD-10-CM | POA: Diagnosis not present

## 2017-02-03 DIAGNOSIS — Z23 Encounter for immunization: Secondary | ICD-10-CM | POA: Diagnosis not present

## 2017-02-03 DIAGNOSIS — E1169 Type 2 diabetes mellitus with other specified complication: Secondary | ICD-10-CM | POA: Diagnosis not present

## 2017-02-03 LAB — POCT UA - MICROALBUMIN: Microalbumin Ur, POC: 20 mg/L

## 2017-02-03 MED ORDER — LOSARTAN POTASSIUM 25 MG PO TABS: 25.0000 mg | ORAL_TABLET | Freq: Every day | ORAL | 3 refills | Status: DC

## 2017-02-03 MED ORDER — SILDENAFIL CITRATE 20 MG PO TABS: ORAL_TABLET | ORAL | 5 refills | Status: DC

## 2017-02-03 MED ORDER — TAMSULOSIN HCL 0.4 MG PO CAPS: 0.4000 mg | ORAL_CAPSULE | Freq: Every day | ORAL | 3 refills | Status: DC

## 2017-02-03 MED ORDER — MELATONIN 5 MG PO TABS: 1.0000 | ORAL_TABLET | Freq: Every day | ORAL | 0 refills | Status: DC

## 2017-02-03 MED ORDER — CANAGLIFLOZIN-METFORMIN HCL ER 50-1000 MG PO TB24: 1.0000 | ORAL_TABLET | Freq: Every day | ORAL | 3 refills | Status: DC

## 2017-02-03 MED ORDER — TAMSULOSIN HCL 0.4 MG PO CAPS: 0.4000 mg | ORAL_CAPSULE | Freq: Every day | ORAL | 12 refills | Status: DC

## 2017-02-03 NOTE — Patient Instructions (Addendum)
Please get a copy of recent lab work that was done through your work. Please get a copy of Hepatitis C results that would have bee done through lab work. Please have [facility] send [place] copy of colonoscopy when you have this done. You will hear about your eye appointment referral. Please have [facility] draw Vitamin D level if they can. Try Melatonin OTC at bedtime. If that does not work then try Benadryl at bedtime.   Our fax number is [phone].

## 2017-02-03 NOTE — Progress Notes (Signed)
Jesse Zimmerman  MRN: [id number] DOB: 09-26-1960  Subjective:  HPI  Patient is here for routine follow up. DM: patient checks his sugar once a week and reading usually around 200.  Patient does get some low sugar readings but states seldom. No numbness or tingling. He is due for eye exam. Patient states he had lab work done through work at [employer] 01/21/17 and his A1C was 9.0 or 9.1.  Patient would like to discuss switching Invokamet to XR because he forgets the second dose of the regular Invokamet. Lab Results  Component Value Date   HGBA1C 9.9 (H) 05/21/2015   Wt Readings from Last 3 Encounters:  02/03/17 (!) 310 lb (140.6 kg)  11/10/16 (!) 320 lb (145.2 kg)  11/03/16 (!) 317 lb (143.8 kg)    Patient would like to have RX for Flomax to use when he has recurrent kidney stones, this medication helps with the flow.  Patient was started on Vitamin D to take 1 times daily on 11/10/16 that level was checked before starting this medication on 11/03/16 13.0.  Patient is also having trouble with sleeping, a lot of stress at work. Never had medication for this. Patient Active Problem List   Diagnosis Date Noted  . Contusion, back 12/26/2014  . Bruising 12/26/2014  . Back pain, chronic 11/22/2014  . Diabetes mellitus type 2 in obese ([place]) 11/22/2014  . 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 Acid reflux 11/22/2014  . HLD (hyperlipidemia) 11/22/2014  . BP (high blood pressure) 11/22/2014  . 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 Cannot sleep 11/22/2014  . Cirrhosis ([place]) 11/22/2014  . Adiposity 11/22/2014  . Change in blood platelet count 11/22/2014  . Avitaminosis D 11/22/2014  . Encephalopathy, hepatic ([place]) 05/23/2012  . 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 Chronic back pain   . Cirrhosis ([place])   . Diabetes mellitus without complication ([place])   . Hyperlipidemia     Social History   Social History  . Marital status: Married    Spouse name: Velva Harman  .  Number of children: 0  . Years of education: college   Occupational History  . work at [employer] Topics  . Smoking status: Never Smoker  . Smokeless tobacco: Never Used  . Alcohol use No  . Drug use: No  . Sexual activity: Not Currently   Other Topics Concern  . Not on file   Social History Narrative  . No narrative on file    Outpatient Encounter Prescriptions as of 02/03/2017  Medication Sig Note  . Canagliflozin-Metformin HCl (INVOKAMET) 50-1000 MG TABS Take 1 tablet by mouth 2 (two) times daily.   . furosemide (LASIX) 20 MG tablet 1-2 pills daily   . nadolol (CORGARD) 20 MG tablet Take 20 mg by mouth daily.  11/22/2014: Received from: [facility]  . promethazine (PHENERGAN) 25 MG suppository Place 1 suppository (25 mg total) rectally every 6 (six) hours as needed for nausea or vomiting.   . Vitamin D, Ergocalciferol, (DRISDOL) 50000 units CAPS capsule Take 1 capsule (50,000 Units total) by mouth every 7 (seven) days.   . [DISCONTINUED] azithromycin (ZITHROMAX) 250 MG tablet Take 2 tablets the first day then one daily until finished. (Patient not taking: Reported on 11/10/2016)   . [DISCONTINUED] CANAGLIFLOZIN PO Take by mouth. invokana 06/12/2015: Received from: [facility]  . [DISCONTINUED] fluconazole (DIFLUCAN) 150 MG tablet One pill daily x 3 days as  needed for recurrence of yeast infections   . [DISCONTINUED] sildenafil (REVATIO) 20 MG tablet 2 to 4 tablets daily as needed (Patient not taking: Reported on 04/30/2016)    No facility-administered encounter medications on file as of 02/03/2017.     Allergies  Allergen Reactions  . Latex     LATEX TAPE  . Pineapple Hives    Review of Systems  Constitutional: Negative.   Respiratory: Negative.   Cardiovascular: Negative.   Gastrointestinal: Negative.   Genitourinary: Negative.   Musculoskeletal: Positive for back pain.  Neurological: Negative.     Endo/Heme/Allergies: Negative.   Psychiatric/Behavioral: Negative.     Objective:  BP 124/62   Pulse 74   Temp 97.6 F (36.4 C)   Resp 14   Wt (!) 310 lb (140.6 kg)   BMI 42.04 kg/m   Physical Exam  Constitutional: He is oriented to person, place, and time and well-developed, well-nourished, and in no distress.  HENT:  Head: Normocephalic and atraumatic.  Eyes: Conjunctivae are normal. No scleral icterus.  Neck: No thyromegaly present.  Cardiovascular: Normal rate, regular rhythm, normal heart sounds and intact distal pulses.  Exam reveals no gallop.   No murmur heard. Pulmonary/Chest: Effort normal and breath sounds normal. No respiratory distress. He has no wheezes.  Abdominal: Soft.  Musculoskeletal: He exhibits edema (1+).  Neurological: He is alert and oriented to person, place, and time.  Skin: Skin is warm and dry.  Psychiatric: Mood, memory, affect and judgment normal.   Assessment and Plan :  1. Vitamin D deficiency Advised patient to ask [facility] next week to draw Vitamin D level when he has CBC drawn if they can.  2. Other fatigue 3. Recurrent kidney stones RX provided. Follow as needed. - tamsulosin (FLOMAX) 0.4 MG CAPS capsule; Take 1 capsule (0.4 mg total) by mouth daily.  Dispense: 30 capsule; Refill: 12  4. Diabetes mellitus type 2 in obese [facility]) Patient had A1C and lab work done through work at [employer] and will get those records to [place] to update our system. For kidney protection start losartan. Referral for diabetic eye exam made.  Patient wanted to change  - POCT UA - Microalbumin - Pneumococcal polysaccharide vaccine 23-valent greater than or equal to 2yo subcutaneous/IM - Ambulatory referral to Ophthalmology  5. Essential hypertension Stable.  6. Other male erectile dysfunction Refill given. - sildenafil (REVATIO) 20 MG tablet; 2 to 4 tablets daily as needed  Dispense: 100 tablet; Refill: 5  7. Need for diphtheria-tetanus-pertussis (Tdap) vaccine -  Tdap vaccine greater than or equal to 7yo IM  8. Need for pneumococcal vaccination - Pneumococcal polysaccharide vaccine 23-valent greater than or equal to 2yo subcutaneous/IM  9. Other insomnia Try Melatonin first and then try Benadryl if this does not work may need to try medication. - Melatonin 5 MG TABS; Take 1 tablet (5 mg total) by mouth at bedtime.  Dispense: 30 tablet; Refill: 0 HPI, Exam and A&P transcribed by Theressa Millard, RMA under direction and in the presence of Miguel Aschoff, MD. I have done the exam and reviewed the chart and it is accurate to the best of my knowledge. Development worker, community has been used and  any errors in dictation or transcription are unintentional. Miguel Aschoff M.D. [facility]

## 2017-03-18 LAB — HM DIABETES EYE EXAM

## 2017-03-21 ENCOUNTER — Encounter: Payer: Self-pay | Admitting: Ophthalmology

## 2017-03-30 ENCOUNTER — Encounter: Payer: Self-pay | Admitting: Family Medicine

## 2017-03-30 ENCOUNTER — Ambulatory Visit (INDEPENDENT_AMBULATORY_CARE_PROVIDER_SITE_OTHER): Payer: 59 | Admitting: Family Medicine

## 2017-03-30 VITALS — BP 124/64 | HR 78 | Temp 97.7°F | Wt 316.0 lb

## 2017-03-30 DIAGNOSIS — Z6841 Body Mass Index (BMI) 40.0 and over, adult: Secondary | ICD-10-CM

## 2017-03-30 DIAGNOSIS — L0291 Cutaneous abscess, unspecified: Secondary | ICD-10-CM

## 2017-03-30 DIAGNOSIS — G4700 Insomnia, unspecified: Secondary | ICD-10-CM

## 2017-03-30 MED ORDER — MUPIROCIN 2 % EX OINT: 1.0000 "application " | TOPICAL_OINTMENT | Freq: Two times a day (BID) | CUTANEOUS | 1 refills | Status: DC

## 2017-03-30 MED ORDER — ZOLPIDEM TARTRATE 10 MG PO TABS: 10.0000 mg | ORAL_TABLET | Freq: Every evening | ORAL | 0 refills | Status: DC | PRN

## 2017-03-30 NOTE — Progress Notes (Signed)
Patient: Jesse Zimmerman Male    DOB: 1961/05/10   56 y.o.   MRN: [id number] Visit Date: 03/30/2017  Today's Provider: Wilhemena Durie, MD   Chief Complaint  Patient presents with  . Obesity  . Cyst  . Insomnia   Subjective:    HPI Pt is here for an appeals form for his employer. His BMI is too high at 42.86. He reports that he walks daily about 1- 2 miles, sometimes more.    He also complains of a cyst/abcess on his right side around his belt line. He reports that it has gotten better but has been there for about a month and does not want to heal.   Also he was suppose to follow up in November for insomnia, he tried Melatonin and is not working. He feels like it is his anxiety and thinking too much then he cant turn his brain off. Pt plans to retire from work at age 75.    Allergies  Allergen Reactions  . Latex     LATEX TAPE  . Pineapple Hives     Current Outpatient Prescriptions:  .  Canagliflozin-Metformin HCl ER (INVOKAMET XR) 50-1000 MG TB24, Take 1 tablet by mouth daily., Disp: 90 tablet, Rfl: 3 .  furosemide (LASIX) 20 MG tablet, 1-2 pills daily, Disp: 180 tablet, Rfl: 1 .  losartan (COZAAR) 25 MG tablet, Take 1 tablet (25 mg total) by mouth daily., Disp: 90 tablet, Rfl: 3 .  Melatonin 5 MG TABS, Take 1 tablet (5 mg total) by mouth at bedtime., Disp: 30 tablet, Rfl: 0 .  nadolol (CORGARD) 20 MG tablet, Take 20 mg by mouth daily. , Disp: , Rfl:  .  promethazine (PHENERGAN) 25 MG suppository, Place 1 suppository (25 mg total) rectally every 6 (six) hours as needed for nausea or vomiting., Disp: 12 each, Rfl: 12 .  sildenafil (REVATIO) 20 MG tablet, 2 to 4 tablets daily as needed, Disp: 100 tablet, Rfl: 5 .  tamsulosin (FLOMAX) 0.4 MG CAPS capsule, Take 1 capsule (0.4 mg total) by mouth daily., Disp: 90 capsule, Rfl: 3 .  Vitamin D, Ergocalciferol, (DRISDOL) 50000 units CAPS capsule, Take 1 capsule (50,000 Units total) by mouth every 7 (seven) days., Disp: 12  capsule, Rfl: 1  Review of Systems  Constitutional: Negative.   HENT: Negative.   Eyes: Negative.   Respiratory: Positive for cough.   Cardiovascular: Negative.   Gastrointestinal: Negative.   Endocrine: Negative.   Genitourinary: Negative.   Skin: Negative.   Allergic/Immunologic: Negative.   Neurological: Negative.   Hematological: Negative.   Psychiatric/Behavioral: Positive for sleep disturbance.    Social History  Substance Use Topics  . Smoking status: Never Smoker  . Smokeless tobacco: Never Used  . Alcohol use No   Objective:   BP 124/64 (BP Location: Left Arm, Patient Position: Sitting, Cuff Size: Large)   Pulse 78   Temp 97.7 F (36.5 C) (Oral)   Wt (!) 316 lb (143.3 kg)   BMI 42.86 kg/m  Vitals:   03/30/17 1150  BP: 124/64  Pulse: 78  Temp: 97.7 F (36.5 C)  TempSrc: Oral  Weight: (!) 316 lb (143.3 kg)     Physical Exam  Constitutional: He is oriented to person, place, and time. He appears well-developed and well-nourished.  Eyes: Pupils are equal, round, and reactive to light. Conjunctivae and EOM are normal.  Neck: Normal range of motion. Neck supple.  Cardiovascular: Normal rate, regular rhythm, normal  heart sounds and intact distal pulses.   Pulmonary/Chest: Effort normal and breath sounds normal.  Musculoskeletal: Normal range of motion.  Neurological: He is alert and oriented to person, place, and time. He has normal reflexes.  Skin: Skin is warm and dry.  Healing well the size of a silver dollar   Psychiatric: He has a normal mood and affect. His behavior is normal. Judgment and thought content normal.        Assessment & Plan:       1. Class 3 severe obesity due to excess calories without serious comorbidity with body mass index (BMI) of 40.0 to 44.9 in adult [facility]) Continue diet and exercise. Pt has lost 100lbs and hopes to get below 300lbs in next year.  2. Insomnia, unspecified type Failed Melatonin. Try Ambien follow up in 1 month  as scheduled.  - zolpidem (AMBIEN) 10 MG tablet; Take 1 tablet (10 mg total) by mouth at bedtime as needed for sleep.  Dispense: 30 tablet; Refill: 0  3. Abscess Not infected at this time.  - mupirocin ointment (BACTROBAN) 2 %; Place 1 application into the nose 2 (two) times daily.  Dispense: 22 g; Refill: 1      HPI, Exam, and A&P Transcribed under the direction and in the presence of Nakkia Mackiewicz L. Cranford Mon, MD  Electronically Signed: Katina Dung, CMA  I have done the exam and reviewed the above chart and it is accurate to the best of my knowledge. Development worker, community has been used in this note in any air is in the dictation or transcription are unintentional.  Wilhemena Durie, MD  [place]

## 2017-04-17 ENCOUNTER — Observation Stay
Admission: EM | Admit: 2017-04-17 | Discharge: 2017-04-19 | Disposition: A | Discharge status: Home / Self Care | Payer: 59 | Attending: Internal Medicine | Admitting: Internal Medicine

## 2017-04-17 ENCOUNTER — Encounter: Payer: Self-pay | Admitting: Internal Medicine

## 2017-04-17 DIAGNOSIS — I8510 Secondary esophageal varices without bleeding: Secondary | ICD-10-CM | POA: Diagnosis not present

## 2017-04-17 DIAGNOSIS — K921 Melena: Secondary | ICD-10-CM | POA: Diagnosis not present

## 2017-04-17 DIAGNOSIS — I10 Essential (primary) hypertension: Secondary | ICD-10-CM | POA: Insufficient documentation

## 2017-04-17 DIAGNOSIS — K219 Gastro-esophageal reflux disease without esophagitis: Secondary | ICD-10-CM | POA: Diagnosis not present

## 2017-04-17 DIAGNOSIS — Z7984 Long term (current) use of oral hypoglycemic drugs: Secondary | ICD-10-CM | POA: Diagnosis not present

## 2017-04-17 DIAGNOSIS — K7460 Unspecified cirrhosis of liver: Secondary | ICD-10-CM | POA: Diagnosis not present

## 2017-04-17 DIAGNOSIS — K3189 Other diseases of stomach and duodenum: Secondary | ICD-10-CM | POA: Diagnosis not present

## 2017-04-17 DIAGNOSIS — E119 Type 2 diabetes mellitus without complications: Secondary | ICD-10-CM | POA: Insufficient documentation

## 2017-04-17 DIAGNOSIS — K766 Portal hypertension: Principal | ICD-10-CM

## 2017-04-17 DIAGNOSIS — K922 Gastrointestinal hemorrhage, unspecified: Secondary | ICD-10-CM

## 2017-04-17 DIAGNOSIS — K2980 Duodenitis without bleeding: Secondary | ICD-10-CM | POA: Insufficient documentation

## 2017-04-17 DIAGNOSIS — K7581 Nonalcoholic steatohepatitis (NASH): Secondary | ICD-10-CM | POA: Insufficient documentation

## 2017-04-17 DIAGNOSIS — Z6841 Body Mass Index (BMI) 40.0 and over, adult: Secondary | ICD-10-CM | POA: Diagnosis not present

## 2017-04-17 DIAGNOSIS — D696 Thrombocytopenia, unspecified: Secondary | ICD-10-CM | POA: Diagnosis not present

## 2017-04-17 DIAGNOSIS — Z23 Encounter for immunization: Secondary | ICD-10-CM | POA: Diagnosis not present

## 2017-04-17 DIAGNOSIS — E785 Hyperlipidemia, unspecified: Secondary | ICD-10-CM | POA: Insufficient documentation

## 2017-04-17 DIAGNOSIS — Z79899 Other long term (current) drug therapy: Secondary | ICD-10-CM | POA: Diagnosis not present

## 2017-04-17 HISTORY — DX: Nonalcoholic steatohepatitis (NASH): K75.81

## 2017-04-17 HISTORY — DX: Esophageal varices without bleeding: I85.00

## 2017-04-17 LAB — COMPREHENSIVE METABOLIC PANEL
ALT: 30 U/L (ref 17–63)
AST: 39 U/L (ref 15–41)
Albumin: 3 g/dL — ABNORMAL LOW (ref 3.5–5.0)
Alkaline Phosphatase: 91 U/L (ref 38–126)
Anion gap: 5 (ref 5–15)
BUN: 42 mg/dL — ABNORMAL HIGH (ref 6–20)
CO2: 25 mmol/L (ref 22–32)
Calcium: 8.7 mg/dL — ABNORMAL LOW (ref 8.9–10.3)
Chloride: 108 mmol/L (ref 101–111)
Creatinine, Ser: 0.89 mg/dL (ref 0.61–1.24)
GFR calc Af Amer: >60 mL/min (ref >60)
GFR calc non Af Amer: >60 mL/min (ref >60)
Glucose, Bld: 215 mg/dL — ABNORMAL HIGH (ref 65–99)
Potassium: 4.4 mmol/L (ref 3.5–5.1)
Sodium: 138 mmol/L (ref 135–145)
Total Bilirubin: 2.5 mg/dL — ABNORMAL HIGH (ref 0.3–1.2)
Total Protein: 6.6 g/dL (ref 6.5–8.1)

## 2017-04-17 LAB — GLUCOSE, CAPILLARY
Glucose-Capillary: 198 mg/dL — ABNORMAL HIGH (ref 65–99)
Glucose-Capillary: 163 mg/dL — ABNORMAL HIGH (ref 65–99)
Glucose-Capillary: 114 mg/dL — ABNORMAL HIGH (ref 65–99)

## 2017-04-17 LAB — TYPE AND SCREEN
ABO/RH(D): O POS
Antibody Screen: NEGATIVE

## 2017-04-17 LAB — CBC
HCT: 34.6 % — ABNORMAL LOW (ref 40.0–52.0)
Hemoglobin: 12.1 g/dL — ABNORMAL LOW (ref 13.0–18.0)
MCH: 33.2 pg (ref 26.0–34.0)
MCHC: 34.9 g/dL (ref 32.0–36.0)
MCV: 95.4 fL (ref 80.0–100.0)
Platelets: 84 10*3/uL — ABNORMAL LOW (ref 150–440)
RBC: 3.63 MIL/uL — ABNORMAL LOW (ref 4.40–5.90)
RDW: 14.4 % (ref 11.5–14.5)
WBC: 7.2 10*3/uL (ref 3.8–10.6)

## 2017-04-17 LAB — HEMOGLOBIN
Hemoglobin: 10.9 g/dL — ABNORMAL LOW (ref 13.0–18.0)
Hemoglobin: 9.7 g/dL — ABNORMAL LOW (ref 13.0–18.0)

## 2017-04-17 MED ORDER — SODIUM CHLORIDE 0.9 % IV SOLN
INTRAVENOUS | Status: DC
  Administered 2017-04-17 (×2): via INTRAVENOUS

## 2017-04-17 MED ORDER — ONDANSETRON HCL 4 MG/2ML IJ SOLN
4.0000 mg | Freq: Once | INTRAMUSCULAR | Status: AC
  Administered 2017-04-17: 4 mg via INTRAVENOUS

## 2017-04-17 MED ORDER — SODIUM CHLORIDE 0.9 % IV SOLN
80.0000 mg | Freq: Once | INTRAVENOUS | Status: AC
  Administered 2017-04-17: 80 mg via INTRAVENOUS
  Filled 2017-04-17: qty 80

## 2017-04-17 MED ORDER — INFLUENZA VAC SPLIT QUAD 0.5 ML IM SUSY
0.5000 mL | PREFILLED_SYRINGE | INTRAMUSCULAR | Status: AC
  Administered 2017-04-18: 0.5 mL via INTRAMUSCULAR
  Filled 2017-04-17: qty 0.5

## 2017-04-17 MED ORDER — ONDANSETRON HCL 4 MG/2ML IJ SOLN
4.0000 mg | Freq: Four times a day (QID) | INTRAMUSCULAR | Status: DC | PRN
Start: 2017-04-17 — End: 2017-04-19

## 2017-04-17 MED ORDER — DOCUSATE SODIUM 100 MG PO CAPS: 100.0000 mg | ORAL_CAPSULE | Freq: Two times a day (BID) | ORAL | Status: DC | PRN

## 2017-04-17 MED ORDER — PROPRANOLOL HCL 10 MG PO TABS
10.0000 mg | ORAL_TABLET | Freq: Two times a day (BID) | ORAL | Status: DC
  Administered 2017-04-17 – 2017-04-19 (×4): 10 mg via ORAL
  Filled 2017-04-17 (×6): qty 1

## 2017-04-17 MED ORDER — SODIUM CHLORIDE 0.9 % IV SOLN
8.0000 mg/h | INTRAVENOUS | Status: DC
  Administered 2017-04-17 – 2017-04-19 (×5): 8 mg/h via INTRAVENOUS
  Filled 2017-04-17 (×5): qty 80

## 2017-04-17 MED ORDER — ONDANSETRON HCL 4 MG/2ML IJ SOLN
INTRAMUSCULAR | Status: AC
  Filled 2017-04-17: qty 2

## 2017-04-17 MED ORDER — TAMSULOSIN HCL 0.4 MG PO CAPS: 0.4000 mg | ORAL_CAPSULE | Freq: Every day | ORAL | Status: DC

## 2017-04-17 MED ORDER — INSULIN ASPART 100 UNIT/ML ~~LOC~~ SOLN
0.0000 [IU] | Freq: Three times a day (TID) | SUBCUTANEOUS | Status: DC
  Administered 2017-04-17 (×2): 2 [IU] via SUBCUTANEOUS
  Administered 2017-04-18: 1 [IU] via SUBCUTANEOUS
  Administered 2017-04-18: 3 [IU] via SUBCUTANEOUS
  Administered 2017-04-19 (×2): 2 [IU] via SUBCUTANEOUS
  Filled 2017-04-17 (×6): qty 1

## 2017-04-17 MED ORDER — SODIUM CHLORIDE 0.9 % IV BOLUS (SEPSIS)
1000.0000 mL | Freq: Once | INTRAVENOUS | Status: AC
  Administered 2017-04-17: 1000 mL via INTRAVENOUS

## 2017-04-17 MED ORDER — SODIUM CHLORIDE 0.9 % IV SOLN
50.0000 ug/h | INTRAVENOUS | Status: DC
  Administered 2017-04-17 – 2017-04-19 (×5): 50 ug/h via INTRAVENOUS
  Filled 2017-04-17 (×8): qty 1
  Filled 2017-04-17: qty 5
  Filled 2017-04-17 (×2): qty 1

## 2017-04-17 NOTE — H&P (Signed)
[facility] [place] NAME: Jesse Zimmerman    MR#:  [id number]  DATE OF BIRTH:  1960/12/10  DATE OF ADMISSION:  04/17/2017  PRIMARY CARE PHYSICIAN: Jerrol Banana., MD   REQUESTING/REFERRING PHYSICIAN: PAduchowski  CHIEF COMPLAINT:   Chief Complaint  Patient presents with  . Melena  . Emesis    HISTORY OF PRESENT ILLNESS: Jesse Zimmerman  is a 56 y.o. male with a known history of NASH, Liver cirrhosis, Varices on esophagus- follows at [facility], Had some hard dark stool on Friday and yesterday morning had a dark vomit- took some phenergan- his vomiting stopped, but cont to have black colored liquidy stool every hour for whole day yesterday and feeling some dizzi, so came to ER. His Hb dropped some, but still 12, Vitals stable. ER started on Octreotide and Protonix drip and spoke to GI and gave for admission.  PAST MEDICAL HISTORY:   Past Medical History:  Diagnosis Date  . Chronic back pain   . Cirrhosis ([place])    NASH  . Diabetes mellitus without complication ([place])   . Esophageal varices ([place])   . Hyperlipidemia   . NASH (nonalcoholic steatohepatitis)     PAST SURGICAL HISTORY: Past Surgical History:  Procedure Laterality Date  . EXCESSIVE THIGH / HIP / BUTTOCK / FLANK SKIN EXCISION     PART OF INNER THIGH/DUE TO SEPSIS INFECTION REMOVED  . KIDNEY STONE SURGERY     removed  . TONSILLECTOMY AND ADENOIDECTOMY      SOCIAL HISTORY:  Social History  Substance Use Topics  . Smoking status: Never Smoker  . Smokeless tobacco: Never Used  . Alcohol use No    FAMILY HISTORY:  Family History  Problem Relation Age of Onset  . Hypertension Mother   . Breast cancer Mother   . Dementia Mother   . Heart attack Father   . Gout Father   . Hypertension Father   . Hypertension Sister   . Hypertension Sister   . Colon cancer Maternal Aunt   . Colon cancer Maternal Grandmother   . Colon cancer Paternal Grandmother     DRUG  ALLERGIES:  Allergies  Allergen Reactions  . Latex     LATEX TAPE  . Pineapple Hives    REVIEW OF SYSTEMS:   CONSTITUTIONAL: No fever, fatigue or weakness.  EYES: No blurred or double vision.  EARS, NOSE, AND THROAT: No tinnitus or ear pain.  RESPIRATORY: No cough, shortness of breath, wheezing or hemoptysis.  CARDIOVASCULAR: No chest pain, orthopnea, edema.  GASTROINTESTINAL: No nausea, vomiting, diarrhea or abdominal pain. Dark stool. GENITOURINARY: No dysuria, hematuria.  ENDOCRINE: No polyuria, nocturia,  HEMATOLOGY: No anemia, easy bruising or bleeding SKIN: No rash or lesion. MUSCULOSKELETAL: No joint pain or arthritis.   NEUROLOGIC: No tingling, numbness, weakness.  PSYCHIATRY: No anxiety or depression.   MEDICATIONS AT HOME:  Prior to Admission medications   Medication Sig Start Date End Date Taking? Authorizing Provider  Canagliflozin-Metformin HCl ER (INVOKAMET XR) 50-1000 MG TB24 Take 1 tablet by mouth daily. 02/03/17  Yes Jerrol Banana., MD  furosemide (LASIX) 20 MG tablet 1-2 pills daily 11/10/16  Yes Jerrol Banana., MD  propranolol (INDERAL) 10 MG tablet Take 10 mg by mouth 2 (two) times daily.   Yes [provider]  losartan (COZAAR) 25 MG tablet Take 1 tablet (25 mg total) by mouth daily. Patient not taking: Reported on 04/17/2017 02/03/17   Rosanna Randy,  Retia Passe., MD  Melatonin 5 MG TABS Take 1 tablet (5 mg total) by mouth at bedtime. Patient not taking: Reported on 04/17/2017 02/03/17   Jerrol Banana., MD  mupirocin ointment (BACTROBAN) 2 % Place 1 application into the nose 2 (two) times daily. 03/30/17   Jerrol Banana., MD  promethazine (PHENERGAN) 25 MG suppository Place 1 suppository (25 mg total) rectally every 6 (six) hours as needed for nausea or vomiting. 04/02/15   Jerrol Banana., MD  sildenafil (REVATIO) 20 MG tablet 2 to 4 tablets daily as needed 02/03/17   Jerrol Banana., MD  tamsulosin [facility]) 0.4  MG CAPS capsule Take 1 capsule (0.4 mg total) by mouth daily. 02/03/17   Jerrol Banana., MD  Vitamin D, Ergocalciferol, (DRISDOL) 50000 units CAPS capsule Take 1 capsule (50,000 Units total) by mouth every 7 (seven) days. 11/10/16   Jerrol Banana., MD  zolpidem (AMBIEN) 10 MG tablet Take 1 tablet (10 mg total) by mouth at bedtime as needed for sleep. Patient not taking: Reported on 04/17/2017 03/30/17 04/29/17  Jerrol Banana., MD      PHYSICAL EXAMINATION:   VITAL SIGNS: Height 6' (1.829 m).  GENERAL:  56 y.o.-year-old obese patient lying in the bed with no acute distress.  EYES: Pupils equal, round, reactive to light and accommodation. No scleral icterus. Extraocular muscles intact.  HEENT: Head atraumatic, normocephalic. Oropharynx and nasopharynx clear.  NECK:  Supple, no jugular venous distention. No thyroid enlargement, no tenderness.  LUNGS: Normal breath sounds bilaterally, no wheezing, rales,rhonchi or crepitation. No use of accessory muscles of respiration.  CARDIOVASCULAR: S1, S2 normal. No murmurs, rubs, or gallops.  ABDOMEN: Soft, nontender, nondistended. Bowel sounds present. No organomegaly or mass.  EXTREMITIES: No pedal edema, cyanosis, or clubbing.  NEUROLOGIC: Cranial nerves II through XII are intact. Muscle strength 5/5 in all extremities. Sensation intact. Gait not checked.  PSYCHIATRIC: The patient is alert and oriented x 3.  SKIN: No obvious rash, lesion, or ulcer.   LABORATORY PANEL:   CBC  Recent Labs Lab 04/17/17 0548  WBC 7.2  HGB 12.1*  HCT 34.6*  PLT 84*  MCV 95.4  MCH 33.2  MCHC 34.9  RDW 14.4   ------------------------------------------------------------------------------------------------------------------  Chemistries   Recent Labs Lab 04/17/17 0548  NA 138  K 4.4  CL 108  CO2 25  GLUCOSE 215*  BUN 42*  CREATININE 0.89  CALCIUM 8.7*  AST 39  ALT 30  ALKPHOS 91  BILITOT 2.5*    ------------------------------------------------------------------------------------------------------------------ CrCl cannot be calculated (Unknown ideal weight.). ------------------------------------------------------------------------------------------------------------------ No results for input(s): TSH, T4TOTAL, T3FREE, THYROIDAB in the last 72 hours.  Invalid input(s): FREET3   Coagulation profile No results for input(s): INR, PROTIME in the last 168 hours. ------------------------------------------------------------------------------------------------------------------- No results for input(s): DDIMER in the last 72 hours. -------------------------------------------------------------------------------------------------------------------  Cardiac Enzymes No results for input(s): CKMB, TROPONINI, MYOGLOBIN in the last 168 hours.  Invalid input(s): CK ------------------------------------------------------------------------------------------------------------------ Invalid input(s): POCBNP  ---------------------------------------------------------------------------------------------------------------  Urinalysis    Component Value Date/Time   COLORURINE Yellow 06/11/2013 0902   APPEARANCEUR Clear 06/11/2013 0902   LABSPEC 1.031 06/11/2013 0902   PHURINE 5.0 06/11/2013 0902   GLUCOSEU >=500 06/11/2013 0902   HGBUR 1+ 06/11/2013 0902   BILIRUBINUR negative 06/12/2015 1223   BILIRUBINUR Negative 06/11/2013 0902   KETONESUR Trace 06/11/2013 0902   PROTEINUR trace 06/12/2015 1223   PROTEINUR Negative 06/11/2013 0902   UROBILINOGEN 0.2 06/12/2015 1223   NITRITE  positive 06/12/2015 1223   NITRITE Negative 06/11/2013 0902   LEUKOCYTESUR moderate (2+) (A) 06/12/2015 1223   LEUKOCYTESUR 1+ 06/11/2013 0902     RADIOLOGY: No results found.  EKG: Orders placed or performed in visit on 07/22/10  . EKG 12-Lead    IMPRESSION AND PLAN:  * Acute GI bleed   Likely  esophageal variceal bleed   Hx of Liver cirrhosis due to NASH   Hb stable currently.   Follow serial Hb   Octreotide and Protonix drip.   GI is aware about the pt.   Keep NPO.  * Hypertension   Hold losartan and furosemide, due to active bleed  * Thrombocytopenia   Due to Liver cirrhosis   Monitor.  * Liver cirrhosis    Cont Inderal.  * DM   Hold oral meds, as NPO   Keep on ISS>  All the records are reviewed and case discussed with ED provider. Management plans discussed with the patient, family and they are in agreement.  CODE STATUS: Full. Code Status History    This patient does not have a recorded code status. Please follow your organizational policy for patients in this situation.    Advance Directive Documentation     Most Recent Value  Type of Advance Directive  Healthcare Power of Attorney  Pre-existing out of facility DNR order (yellow form or pink MOST form)  -  "MOST" Form in Place?  -     His wife is in room during my visit.  TOTAL TIME TAKING CARE OF THIS PATIENT: 50 minutes.    Vaughan Basta M.D on 04/17/2017   Between 7am to 6pm - Pager - [phone]  After 6pm go to [email or web address] - password [email or web address] [facility]  Office  [phone]  CC: Primary care physician; Jerrol Banana., MD   Note: This dictation was prepared with Dragon dictation along with smaller phrase technology. Any transcriptional errors that result from this process are unintentional.

## 2017-04-17 NOTE — ED Provider Notes (Signed)
[facility] Emergency Department Provider Note  Time seen: 7:25 AM  I have reviewed the triage vital signs and the nursing notes.   HISTORY  Chief Complaint Melena and Emesis    HPI Jesse Zimmerman is a 56 y.o. male with a past medical history of [name] cirrhosis, diabetes, obesity, hyperlipidemia, presents to the emergency department with black stool.  According to the patient for the past 2 days he has been experiencing dark stool and abdominal discomfort which she describes as cramping type pain often time resolved after vomiting or having a bowel movement.  Patient states yesterday he developed nausea with several episodes of black vomiting.  Today he continued with very dark loose stool so he came to the emergency department for evaluation.  He states he has stage IV cirrhosis, history of medium-sized varices per patient.  Past Medical History:  Diagnosis Date  . Chronic back pain   . Cirrhosis ([place])   . Diabetes mellitus without complication ([place])   . Hyperlipidemia     Patient Active Problem List   Diagnosis Date Noted  . Contusion, back 12/26/2014  . Bruising 12/26/2014  . Back pain, chronic 11/22/2014  . Diabetes mellitus type 2 in obese ([place]) 11/22/2014  . 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 Acid reflux 11/22/2014  . HLD (hyperlipidemia) 11/22/2014  . BP (high blood pressure) 11/22/2014  . 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 Cannot sleep 11/22/2014  . Cirrhosis ([place]) 11/22/2014  . Adiposity 11/22/2014  . Change in blood platelet count 11/22/2014  . Avitaminosis D 11/22/2014  . Encephalopathy, hepatic ([place]) 05/23/2012  . 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 EXCESSIVE THIGH / HIP / BUTTOCK / FLANK SKIN EXCISION     PART OF INNER THIGH/DUE TO SEPSIS INFECTION REMOVED  . KIDNEY STONE SURGERY     removed  . TONSILLECTOMY AND ADENOIDECTOMY      Prior to Admission medications   Medication Sig Start  Date End Date Taking? Authorizing Provider  Canagliflozin-Metformin HCl ER (INVOKAMET XR) 50-1000 MG TB24 Take 1 tablet by mouth daily. 02/03/17   Jerrol Banana., MD  furosemide (LASIX) 20 MG tablet 1-2 pills daily 11/10/16   Jerrol Banana., MD  losartan (COZAAR) 25 MG tablet Take 1 tablet (25 mg total) by mouth daily. 02/03/17   Jerrol Banana., MD  Melatonin 5 MG TABS Take 1 tablet (5 mg total) by mouth at bedtime. 02/03/17   Jerrol Banana., MD  mupirocin ointment (BACTROBAN) 2 % Place 1 application into the nose 2 (two) times daily. 03/30/17   Jerrol Banana., MD  nadolol (CORGARD) 20 MG tablet Take 20 mg by mouth daily.  07/26/12   [provider]  promethazine (PHENERGAN) 25 MG suppository Place 1 suppository (25 mg total) rectally every 6 (six) hours as needed for nausea or vomiting. 04/02/15   Jerrol Banana., MD  sildenafil (REVATIO) 20 MG tablet 2 to 4 tablets daily as needed 02/03/17   Jerrol Banana., MD  tamsulosin [facility]) 0.4 MG CAPS capsule Take 1 capsule (0.4 mg total) by mouth daily. 02/03/17   Jerrol Banana., MD  Vitamin D, Ergocalciferol, (DRISDOL) 50000 units CAPS capsule Take 1 capsule (50,000 Units total) by mouth every 7 (seven) days. 11/10/16   Jerrol Banana., MD  zolpidem (AMBIEN) 10 MG tablet Take 1 tablet (10 mg total) by mouth at bedtime as needed  for sleep. 03/30/17 04/29/17  Jerrol Banana., MD    Allergies  Allergen Reactions  . Latex     LATEX TAPE  . Pineapple Hives    Family History  Problem Relation Age of Onset  . Hypertension Mother   . Breast cancer Mother   . Dementia Mother   . Heart attack Father   . Gout Father   . Hypertension Father   . Hypertension Sister   . Hypertension Sister   . Colon cancer Maternal Aunt   . Colon cancer Maternal Grandmother   . Colon cancer Paternal Grandmother     Social History Social History  Substance Use Topics  . Smoking status:  Never Smoker  . Smokeless tobacco: Never Used  . Alcohol use No    Review of Systems Constitutional: Negative for fever. Cardiovascular: Negative for chest pain. Respiratory: Negative for shortness of breath. Gastrointestinal: Abdominal cramping, black stool, black vomit Musculoskeletal: Negative for back pain Neurological: Negative for headache All other ROS negative  ____________________________________________  Constitutional: Alert and oriented. Well appearing and in no distress. Eyes: Normal exam ENT   Head: Normocephalic and atraumatic.   Mouth/Throat: Mucous membranes are moist. Cardiovascular: Normal rate, regular rhythm. Respiratory: Normal respiratory effort without tachypnea nor retractions. Breath sounds are clear Gastrointestinal: Soft and nontender. No distention.  Obese Musculoskeletal: Nontender with normal range of motion in all extremities.  Neurologic:  Normal speech and language. No gross focal neurologic deficits Skin:  Skin is warm, dry and intact.  Psychiatric: Mood and affect are normal.  ____________________________________________   INITIAL IMPRESSION / ASSESSMENT AND PLAN / ED COURSE  Pertinent labs & imaging results that were available during my care of the patient were reviewed by me and considered in my medical decision making (see chart for details).  Patient presents to the emergency department for dark stool and dark vomit for the past 2 days.  Differential would include upper GI bleed, lower GI bleed, esophageal varices bleed, dark stool without bleed.  Patient provided a stool sample which is extremely black in color strongly guaiac positive.  Consistent with likely upper GI bleed.  Given the patient's history of esophageal varices we will start the patient on Protonix, and octreotide.  Patient's hemoglobin is 12.1 however this is a two-point drop from 5 months ago.  We will discuss with GI medicine.  We will admit to the hospitalist  service for further treatment.  The patient is agreeable to this plan of care.  Currently the patient has a completely nontender abdomen and denies any abdominal pain.  I discussed the patient with GI medicine, they are aware of the patient's admission.  I discussed the patient with the hospitalist will be admitting the patient to their service.    ____________________________________________   FINAL CLINICAL IMPRESSION(S) / ED DIAGNOSES  Upper GI bleed    Harvest Dark, MD 04/17/17 [phone]

## 2017-04-17 NOTE — ED Notes (Signed)
Called report to [facility]

## 2017-04-17 NOTE — Consult Note (Signed)
[name], MD [facility] ([place]  [address], [place], [place] [address] Phone: [phone] Fax : [phone]  Consultation  Referring Provider:     Dr. Anselm Jungling  Primary Care Physician:  Jerrol Banana., MD Primary Gastroenterologist:  Dr. Vira Agar     Reason for Consultation:     Hematemesis and melena  Date of Admission:  04/17/2017 Date of Consultation:  04/17/2017         HPI:   Jesse Zimmerman is a 56 y.o. 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  The patient states he was diagnosed over 10 years ago by Dr. Sonny Masters.  The patient was then evaluated by Dr. Lanelle Bal in [place] and reports that he has been following up at [facility] with a hepatologist there.  The patient reports that he had a EGD and colonoscopy approximately 10 years ago but does not recall having any recent procedures.  The patient was supposed to have an EGD and colonoscopy but he was told by Dr. Vira Agar that he should go down to [facility] and have the procedures.  The patient now reports that he has had black stools for the last few days and vomited up a large black object that he believed to be blood.  There is no report of any abdominal pain.  The patient also reports that at his first endoscopy he was found to have mild to moderate esophageal varices.  The patient denies being told that he had any banding of the varices at that time.  There is no report of any further vomiting but he did have a melanotic stool last night.  The patient also reports that he contacted Dr. Percell [place] office back in May for an episode of melena.  There is no report of any fevers chills or abdominal pain at this time.  Past Medical History:  Diagnosis Date  . Chronic back pain   . Cirrhosis ([place])    NASH  . Diabetes mellitus without complication ([place])   . Esophageal varices ([place])   . Hyperlipidemia   . NASH (nonalcoholic steatohepatitis)     Past Surgical History:  Procedure Laterality Date    . EXCESSIVE THIGH / HIP / BUTTOCK / FLANK SKIN EXCISION     PART OF INNER THIGH/DUE TO SEPSIS INFECTION REMOVED  . KIDNEY STONE SURGERY     removed  . TONSILLECTOMY AND ADENOIDECTOMY      Prior to Admission medications   Medication Sig Start Date End Date Taking? Authorizing Provider  Canagliflozin-Metformin HCl ER (INVOKAMET XR) 50-1000 MG TB24 Take 1 tablet by mouth daily. 02/03/17  Yes Jerrol Banana., MD  furosemide (LASIX) 20 MG tablet 1-2 pills daily 11/10/16  Yes Jerrol Banana., MD  propranolol (INDERAL) 10 MG tablet Take 10 mg by mouth 2 (two) times daily.   Yes [provider]  losartan (COZAAR) 25 MG tablet Take 1 tablet (25 mg total) by mouth daily. Patient not taking: Reported on 04/17/2017 02/03/17   Jerrol Banana., MD  Melatonin 5 MG TABS Take 1 tablet (5 mg total) by mouth at bedtime. Patient not taking: Reported on 04/17/2017 02/03/17   Jerrol Banana., MD  mupirocin ointment (BACTROBAN) 2 % Place 1 application into the nose 2 (two) times daily. 03/30/17   Jerrol Banana., MD  promethazine (PHENERGAN) 25 MG suppository Place 1 suppository (25 mg total) rectally every 6 (six) hours as needed for nausea  or vomiting. 04/02/15   Jerrol Banana., MD  sildenafil (REVATIO) 20 MG tablet 2 to 4 tablets daily as needed 02/03/17   Jerrol Banana., MD  tamsulosin [facility]) 0.4 MG CAPS capsule Take 1 capsule (0.4 mg total) by mouth daily. 02/03/17   Jerrol Banana., MD  Vitamin D, Ergocalciferol, (DRISDOL) 50000 units CAPS capsule Take 1 capsule (50,000 Units total) by mouth every 7 (seven) days. 11/10/16   Jerrol Banana., MD  zolpidem (AMBIEN) 10 MG tablet Take 1 tablet (10 mg total) by mouth at bedtime as needed for sleep. Patient not taking: Reported on 04/17/2017 03/30/17 04/29/17  Jerrol Banana., MD    Family History  Problem Relation Age of Onset  . Hypertension Mother   . Breast cancer Mother   .  Dementia Mother   . Heart attack Father   . Gout Father   . Hypertension Father   . Hypertension Sister   . Hypertension Sister   . Colon cancer Maternal Aunt   . Colon cancer Maternal Grandmother   . Colon cancer Paternal Grandmother      Social History  Substance Use Topics  . Smoking status: Never Smoker  . Smokeless tobacco: Never Used  . Alcohol use No    Allergies as of 04/17/2017 - Review Complete 04/17/2017  Allergen Reaction Noted  . Latex  11/22/2014  . Pineapple Hives 11/22/2014    Review of Systems:    All systems reviewed and negative except where noted in HPI.   Physical Exam:  Vital signs in last 24 hours: Temp:  [98.3 F (36.8 C)-99 F (37.2 C)] 99 F (37.2 C) (10/28 1018) Pulse Rate:  [75-89] 78 (10/28 1018) Resp:  [16] 16 (10/28 0915) BP: (97-131)/(53-91) 131/57 (10/28 1018) SpO2:  [97 %-99 %] 99 % (10/28 1018)   General:   Pleasant, cooperative in NAD Head:  Normocephalic and atraumatic. Eyes:   No icterus.   Conjunctiva pink. PERRLA. Ears:  Normal auditory acuity. Neck:  Supple; no masses or thyroidomegaly Lungs: Respirations even and unlabored. Lungs clear to auscultation bilaterally.   No wheezes, crackles, or rhonchi.  Heart:  Regular rate and rhythm;  Without murmur, clicks, rubs or gallops Abdomen:  Soft, positive distention with ascites, nontender. Normal bowel sounds. No appreciable masses or hepatomegaly.  No rebound or guarding.  Rectal:  Not performed. Msk:  Symmetrical without gross deformities.    Extremities:  Without edema, cyanosis or clubbing. Neurologic:  Alert and oriented x3;  grossly normal neurologically. Skin:  Intact without significant lesions or rashes. Cervical Nodes:  No significant cervical adenopathy. Psych:  Alert and cooperative. Normal affect.  LAB RESULTS:  Recent Labs  04/17/17 0548  WBC 7.2  HGB 12.1*  HCT 34.6*  PLT 84*   BMET  Recent Labs  04/17/17 0548  NA 138  K 4.4  CL 108  CO2 25    GLUCOSE 215*  BUN 42*  CREATININE 0.89  CALCIUM 8.7*   LFT  Recent Labs  04/17/17 0548  PROT 6.6  ALBUMIN 3.0*  AST 39  ALT 30  ALKPHOS 91  BILITOT 2.5*   PT/INR No results for input(s): LABPROT, INR in the last 72 hours.  STUDIES: No results found.    Impression / Plan:   Jesse Zimmerman is a 56 y.o. y/o male with melena and vomiting x1 with what appeared to be blood.  The patient reports that the vomiting blood was old blood.  The patient's hemoglobin back in May was 14.1 and he was admitted to the hospital with a hemoglobin of 12.1.  The patient's platelets have been low for some time and his platelets in 2014 were 57 but are 84 today.  The patient's liver enzymes showed a normal AST and ALT with an alkaline phosphatase also being normal.  The patient's bilirubin was 2.5 with his albumin low at 3.  The patient has been told that he likely has stopped bleeding but needs to be set up for an EGD to evaluate his esophageal varices and to see if there is any sign of recent bleeding whereupon he may need banding.  The patient is now on Protonix and octreotide.  The patient has also been told to continue to avoid NSAIDs. I have discussed risks & benefits which include, but are not limited to, bleeding, infection, perforation & drug reaction.  The patient agrees with this plan & written consent will be obtained.      Thank you for involving me in the care of this patient.      LOS: 0 days   [name], MD  04/17/2017, 11:06 AM   Note: This dictation was prepared with Dragon dictation along with smaller phrase technology. Any transcriptional errors that result from this process are unintentional.

## 2017-04-17 NOTE — ED Triage Notes (Signed)
Patient reports symptoms began Friday night.  Patient reports dark stools and has begun to vomited dark colored emesis.

## 2017-04-18 ENCOUNTER — Observation Stay: Payer: 59 | Admitting: Anesthesiology

## 2017-04-18 ENCOUNTER — Encounter
Admission: EM | Disposition: A | Discharge status: Home / Self Care | Payer: Self-pay | Source: Home / Self Care | Attending: Emergency Medicine

## 2017-04-18 DIAGNOSIS — K921 Melena: Secondary | ICD-10-CM | POA: Diagnosis not present

## 2017-04-18 DIAGNOSIS — K766 Portal hypertension: Secondary | ICD-10-CM | POA: Diagnosis not present

## 2017-04-18 DIAGNOSIS — I8510 Secondary esophageal varices without bleeding: Secondary | ICD-10-CM

## 2017-04-18 HISTORY — PX: ESOPHAGOGASTRODUODENOSCOPY (EGD) WITH PROPOFOL: SHX5813

## 2017-04-18 LAB — BASIC METABOLIC PANEL
Anion gap: 3 — ABNORMAL LOW (ref 5–15)
BUN: 40 mg/dL — ABNORMAL HIGH (ref 6–20)
CO2: 21 mmol/L — ABNORMAL LOW (ref 22–32)
Calcium: 7.8 mg/dL — ABNORMAL LOW (ref 8.9–10.3)
Chloride: 114 mmol/L — ABNORMAL HIGH (ref 101–111)
Creatinine, Ser: 0.91 mg/dL (ref 0.61–1.24)
GFR calc Af Amer: >60 mL/min (ref >60)
GFR calc non Af Amer: >60 mL/min (ref >60)
Glucose, Bld: 126 mg/dL — ABNORMAL HIGH (ref 65–99)
Potassium: 3.9 mmol/L (ref 3.5–5.1)
Sodium: 138 mmol/L (ref 135–145)

## 2017-04-18 LAB — CBC
HCT: 28.8 % — ABNORMAL LOW (ref 40.0–52.0)
Hemoglobin: 10.3 g/dL — ABNORMAL LOW (ref 13.0–18.0)
MCH: 34.4 pg — ABNORMAL HIGH (ref 26.0–34.0)
MCHC: 35.9 g/dL (ref 32.0–36.0)
MCV: 95.8 fL (ref 80.0–100.0)
Platelets: 66 10*3/uL — ABNORMAL LOW (ref 150–440)
RBC: 3.01 MIL/uL — ABNORMAL LOW (ref 4.40–5.90)
RDW: 14.5 % (ref 11.5–14.5)
WBC: 6.3 10*3/uL (ref 3.8–10.6)

## 2017-04-18 LAB — GLUCOSE, CAPILLARY
Glucose-Capillary: 124 mg/dL — ABNORMAL HIGH (ref 65–99)
Glucose-Capillary: 132 mg/dL — ABNORMAL HIGH (ref 65–99)
Glucose-Capillary: 202 mg/dL — ABNORMAL HIGH (ref 65–99)
Glucose-Capillary: 194 mg/dL — ABNORMAL HIGH (ref 65–99)

## 2017-04-18 SURGERY — ESOPHAGOGASTRODUODENOSCOPY (EGD) WITH PROPOFOL
Anesthesia: General

## 2017-04-18 MED ORDER — PROPOFOL 10 MG/ML IV BOLUS
INTRAVENOUS | Status: DC | PRN
  Administered 2017-04-18: 50 mg via INTRAVENOUS
  Administered 2017-04-18: 20 mg via INTRAVENOUS
  Administered 2017-04-18: 40 mg via INTRAVENOUS

## 2017-04-18 MED ORDER — ZOLPIDEM TARTRATE 5 MG PO TABS
5.0000 mg | ORAL_TABLET | Freq: Every evening | ORAL | Status: DC | PRN
  Administered 2017-04-18: 5 mg via ORAL
  Filled 2017-04-18: qty 1

## 2017-04-18 NOTE — Op Note (Signed)
[facility] Gastroenterology Patient Name: Jesse Zimmerman Procedure Date: 04/18/2017 11:48 AM MRN: [id number] Account #: [id number] Date of Birth: 23-Oct-1960 Admit Type: Inpatient Age: 56 Room: [facility] ENDO ROOM 4 Gender: Male Note Status: Finalized Procedure:            Upper GI endoscopy Indications:          Melena Providers:            [name] MD, MD Referring MD:         Vaughan Basta (Referring MD) Medicines:            Propofol per Anesthesia Complications:        No immediate complications. Procedure:            Pre-Anesthesia Assessment:                       - Prior to the procedure, a History and Physical was                        performed, and patient medications and allergies were                        reviewed. The patient's tolerance of previous                        anesthesia was also reviewed. The risks and benefits of                        the procedure and the sedation options and risks were                        discussed with the patient. All questions were                        answered, and informed consent was obtained. Prior                        Anticoagulants: The patient has taken no previous                        anticoagulant or antiplatelet agents. ASA Grade                        Assessment: II - A patient with mild systemic disease.                        After reviewing the risks and benefits, the patient was                        deemed in satisfactory condition to undergo the                        procedure.                       After obtaining informed consent, the endoscope was                        passed under direct vision. Throughout the procedure,  the patient's blood pressure, pulse, and oxygen                        saturations were monitored continuously. The Endoscope                        was introduced through the mouth, and advanced to the   second part of duodenum. The upper GI endoscopy was                        accomplished without difficulty. The patient tolerated                        the procedure well. Findings:      Grade II varices were found in the lower third of the esophagus. Three       bands were successfully placed with incomplete eradication of varices.       There was no bleeding during the procedure.      Moderate portal hypertensive gastropathy was found in the gastric antrum.      Inflammation was found in the duodenal bulb. Impression:           - Grade II esophageal varices. Incompletely eradicated.                        Banded.                       - Portal hypertensive gastropathy.                       - Duodenitis.                       - No specimens collected. Recommendation:       - Return patient to hospital ward for ongoing care.                       - Repeat upper endoscopy in 6 weeks for retreatment. Procedure Code(s):    --- Professional ---                       [phone], Esophagogastroduodenoscopy, flexible, transoral;                        with band ligation of esophageal/gastric varices Diagnosis Code(s):    --- Professional ---                       K92.1, Melena (includes Hematochezia)                       I85.00, Esophageal varices without bleeding                       K76.6, Portal hypertension                       K31.89, Other diseases of stomach and duodenum                       K29.80, Duodenitis without bleeding CPT copyright 2016 American Medical Association. All rights reserved. The codes documented in this report are preliminary and upon coder review may  be revised to meet current compliance requirements. [name] MD, MD 04/18/2017 12:29:26 PM This report has been signed electronically. Number of Addenda: 0 Note Initiated On: 04/18/2017 11:48 AM      [facility]

## 2017-04-18 NOTE — Anesthesia Postprocedure Evaluation (Signed)
Anesthesia Post Note  Patient: EARLE TROIANO  Procedure(s) Performed: ESOPHAGOGASTRODUODENOSCOPY (EGD) WITH PROPOFOL (N/A )  Patient location during evaluation: PACU Anesthesia Type: General Level of consciousness: awake Pain management: pain level controlled Vital Signs Assessment: post-procedure vital signs reviewed and stable Respiratory status: spontaneous breathing Cardiovascular status: stable Anesthetic complications: no     Last Vitals:  Vitals:   04/18/17 1308 04/18/17 1354  BP: (!) 149/75 (!) 115/55  Pulse:  73  Resp:  14  Temp:  36.6 C  SpO2:  100%    Last Pain:  Vitals:   04/18/17 1354  TempSrc: Oral  PainSc:                  VAN STAVEREN,Jacynda Brunke

## 2017-04-18 NOTE — Anesthesia Post-op Follow-up Note (Signed)
Anesthesia QCDR form completed.        

## 2017-04-18 NOTE — Transfer of Care (Signed)
Immediate Anesthesia Transfer of Care Note  Patient: Jesse Zimmerman  Procedure(s) Performed: ESOPHAGOGASTRODUODENOSCOPY (EGD) WITH PROPOFOL (N/A )  Patient Location: PACU  Anesthesia Type:General  Level of Consciousness: awake  Airway & Oxygen Therapy: Patient Spontanous Breathing and Patient connected to face mask oxygen  Post-op Assessment: Report given to RN and Post -op Vital signs reviewed and stable  Post vital signs: Reviewed and stable  Last Vitals:  Vitals:   04/18/17 1308 04/18/17 1354  BP: (!) 149/75 (!) 115/55  Pulse:  73  Resp:  14  Temp:  36.6 C  SpO2:  100%    Last Pain:  Vitals:   04/18/17 1354  TempSrc: Oral  PainSc:          Complications: No apparent anesthesia complications

## 2017-04-18 NOTE — Progress Notes (Signed)
BP slightly low.  MD gave order to hold am inderal dose.  Lab came back to get hgb.  They had not got enough blood on previous attempt.  Patient declined another blood draw.  He said his hands are sore from all of the IV start attempts and blood draws.  Patient only takes flomax when needed for kidney stones.  Dr Anselm Jungling gave order to discontinue flomax

## 2017-04-18 NOTE — Progress Notes (Signed)
[facility] [place] NAME: Jesse Zimmerman    MR#:  [id number]  DATE OF BIRTH:  [birth year]  SUBJECTIVE:  CHIEF COMPLAINT:   Chief Complaint  Patient presents with  . Melena  . Emesis    Had one-two BM but now forming stool, still some dark colour. No pain, Seen before Procedure. Hb dropped some.  REVIEW OF SYSTEMS:  CONSTITUTIONAL: No fever, fatigue or weakness.  EYES: No blurred or double vision.  EARS, NOSE, AND THROAT: No tinnitus or ear pain.  RESPIRATORY: No cough, shortness of breath, wheezing or hemoptysis.  CARDIOVASCULAR: No chest pain, orthopnea, edema.  GASTROINTESTINAL: No nausea, vomiting, diarrhea or abdominal pain.  GENITOURINARY: No dysuria, hematuria.  ENDOCRINE: No polyuria, nocturia,  HEMATOLOGY: No anemia, easy bruising or bleeding SKIN: No rash or lesion. MUSCULOSKELETAL: No joint pain or arthritis.   NEUROLOGIC: No tingling, numbness, weakness.  PSYCHIATRY: No anxiety or depression.   ROS  DRUG ALLERGIES:   Allergies  Allergen Reactions  . Latex     LATEX TAPE  . Pineapple Hives    VITALS:  Blood pressure (!) 115/55, pulse 73, temperature 97.8 F (36.6 C), temperature source Oral, resp. rate 14, height 6' (1.829 m), weight (!) 141.1 kg (311 lb), SpO2 100 %.  PHYSICAL EXAMINATION:  GENERAL:  56 y.o.-year-old patient lying in the bed with no acute distress.  EYES: Pupils equal, round, reactive to light and accommodation. No scleral icterus. Extraocular muscles intact.  HEENT: Head atraumatic, normocephalic. Oropharynx and nasopharynx clear.  NECK:  Supple, no jugular venous distention. No thyroid enlargement, no tenderness.  LUNGS: Normal breath sounds bilaterally, no wheezing, rales,rhonchi or crepitation. No use of accessory muscles of respiration.  CARDIOVASCULAR: S1, S2 normal. No murmurs, rubs, or gallops.  ABDOMEN: Soft, nontender, nondistended. Bowel sounds present. No organomegaly or mass.   EXTREMITIES: No pedal edema, cyanosis, or clubbing.  NEUROLOGIC: Cranial nerves II through XII are intact. Muscle strength 5/5 in all extremities. Sensation intact. Gait not checked.  PSYCHIATRIC: The patient is alert and oriented x 3.  SKIN: No obvious rash, lesion, or ulcer.   Physical Exam LABORATORY PANEL:   CBC  Recent Labs Lab 04/18/17 0047  WBC 6.3  HGB 10.3*  HCT 28.8*  PLT 66*   ------------------------------------------------------------------------------------------------------------------  Chemistries   Recent Labs Lab 04/17/17 0548 04/18/17 0047  NA 138 138  K 4.4 3.9  CL 108 114*  CO2 25 21*  GLUCOSE 215* 126*  BUN 42* 40*  CREATININE 0.89 0.91  CALCIUM 8.7* 7.8*  AST 39  --   ALT 30  --   ALKPHOS 91  --   BILITOT 2.5*  --    ------------------------------------------------------------------------------------------------------------------  Cardiac Enzymes No results for input(s): TROPONINI in the last 168 hours. ------------------------------------------------------------------------------------------------------------------  RADIOLOGY:  No results found.  ASSESSMENT AND PLAN:   Principal Problem:   Upper GI bleed Active Problems:   Blood in stool   Secondary esophageal varices without bleeding (H[facility])   Portal hypertension ([facility])   * Acute GI bleed  esophageal variceal bleed   Hx of Liver cirrhosis due to NASH   Hb stable currently.   Follow serial Hb   Octreotide and Protonix drip.   GI is had done Endoscopy- grade 2 esophageal verices found- banding done.   Clear liquid diet.  * Hypertension   Hold losartan and furosemide, due to active bleed  * Thrombocytopenia   Due to Liver cirrhosis   Monitor.  * Liver  cirrhosis    Cont Inderal.  * DM   Hold oral meds, as NPO   Keep on ISS>   Blood sugar stable.     All the records are reviewed and case discussed with Care Management/Social Workerr. Management plans  discussed with the patient, family and they are in agreement.  CODE STATUS: Full.  TOTAL TIME TAKING CARE OF THIS PATIENT: 35 minutes.     POSSIBLE D/C IN 1-2 DAYS, DEPENDING ON CLINICAL CONDITION.   Vaughan Basta M.D on 04/18/2017   Between 7am to 6pm - Pager - [phone]  After 6pm go to [email or web address] - password [email or web address] [facility]  Office  [phone]  CC: Primary care physician; Jerrol Banana., MD  Note: This dictation was prepared with Dragon dictation along with smaller phrase technology. Any transcriptional errors that result from this process are unintentional.

## 2017-04-18 NOTE — Anesthesia Procedure Notes (Signed)
Date/Time: 04/18/2017 12:20 PM Performed by: Allean Found Pre-anesthesia Checklist: Patient identified, Emergency Drugs available, Suction available, Patient being monitored and Timeout performed Patient Re-evaluated:Patient Re-evaluated prior to induction Oxygen Delivery Method: Nasal cannula Induction Type: IV induction Placement Confirmation: positive ETCO2 Dental Injury: Teeth and Oropharynx as per pre-operative assessment

## 2017-04-18 NOTE — Progress Notes (Signed)
Patient transported via bed to endoscopy

## 2017-04-18 NOTE — Anesthesia Preprocedure Evaluation (Signed)
Anesthesia Evaluation  Patient identified by MRN, date of birth, ID band Patient awake    Reviewed: Allergy & Precautions, NPO status , Patient's Chart, lab work & pertinent test results  Airway Mallampati: II       Dental  (+) Teeth Intact   Pulmonary neg pulmonary ROS,     + decreased breath sounds      Cardiovascular Exercise Tolerance: Good hypertension,  Rhythm:Regular Rate:Normal     Neuro/Psych negative neurological ROS  negative psych ROS   GI/Hepatic GERD  Medicated,(+) Hepatitis -, Unspecified  Endo/Other  diabetes, Type 2, Oral Hypoglycemic AgentsMorbid obesity  Renal/GU      Musculoskeletal   Abdominal (+) + obese,   Peds negative pediatric ROS (+)  Hematology   Anesthesia Other Findings   Reproductive/Obstetrics                             Anesthesia Physical Anesthesia Plan  ASA: III  Anesthesia Plan: General   Post-op Pain Management:    Induction: Intravenous  PONV Risk Score and Plan: 0  Airway Management Planned: Natural Airway and Nasal Cannula  Additional Equipment:   Intra-op Plan:   Post-operative Plan:   Informed Consent: I have reviewed the patients History and Physical, chart, labs and discussed the procedure including the risks, benefits and alternatives for the proposed anesthesia with the patient or authorized representative who has indicated his/her understanding and acceptance.     Plan Discussed with: CRNA  Anesthesia Plan Comments:         Anesthesia Quick Evaluation

## 2017-04-19 ENCOUNTER — Telehealth: Payer: Self-pay | Admitting: Family Medicine

## 2017-04-19 LAB — CBC
HCT: 26 % — ABNORMAL LOW (ref 40.0–52.0)
Hemoglobin: 9 g/dL — ABNORMAL LOW (ref 13.0–18.0)
MCH: 32.7 pg (ref 26.0–34.0)
MCHC: 34.5 g/dL (ref 32.0–36.0)
MCV: 94.9 fL (ref 80.0–100.0)
Platelets: 48 10*3/uL — ABNORMAL LOW (ref 150–440)
RBC: 2.74 MIL/uL — ABNORMAL LOW (ref 4.40–5.90)
RDW: 14.4 % (ref 11.5–14.5)
WBC: 3.6 10*3/uL — ABNORMAL LOW (ref 3.8–10.6)

## 2017-04-19 LAB — GLUCOSE, CAPILLARY
Glucose-Capillary: 154 mg/dL — ABNORMAL HIGH (ref 65–99)
Glucose-Capillary: 175 mg/dL — ABNORMAL HIGH (ref 65–99)

## 2017-04-19 LAB — HIV ANTIBODY (ROUTINE TESTING W REFLEX): HIV Screen 4th Generation wRfx: NONREACTIVE

## 2017-04-19 MED ORDER — PANTOPRAZOLE SODIUM 40 MG PO TBEC: 40.0000 mg | DELAYED_RELEASE_TABLET | Freq: Two times a day (BID) | ORAL | Status: DC

## 2017-04-19 MED ORDER — PANTOPRAZOLE SODIUM 40 MG PO TBEC: 40.0000 mg | DELAYED_RELEASE_TABLET | Freq: Two times a day (BID) | ORAL | 0 refills | Status: DC

## 2017-04-19 NOTE — Discharge Summary (Signed)
[facility] NAME: Jesse Zimmerman    MR#:  [id number]  DATE OF BIRTH:  Apr 15, 1961  DATE OF ADMISSION:  04/17/2017 ADMITTING PHYSICIAN: Vaughan Basta, MD  DATE OF DISCHARGE: 04/19/2017   PRIMARY CARE PHYSICIAN: Jerrol Banana., MD    ADMISSION DIAGNOSIS:  Upper GI bleed [K92.2]  DISCHARGE DIAGNOSIS:  Principal Problem:   Upper GI bleed Active Problems:   Blood in stool   Secondary esophageal varices without bleeding (HCC)   Portal hypertension ([place])   SECONDARY DIAGNOSIS:   Past Medical History:  Diagnosis Date  . Chronic back pain   . Cirrhosis ([place])    NASH  . Diabetes mellitus without complication ([place])   . Esophageal varices ([place])   . Hyperlipidemia   . NASH (nonalcoholic steatohepatitis)     HOSPITAL COURSE:   * Acute GI bleed  esophageal variceal bleed Hx of Liver cirrhosis due to NASH Hb stable currently. Follow serial Hb Octreotide and Protonix drip. GI is had done Endoscopy- grade 2 esophageal verices found- banding done.   Clear liquid diet. Tolerated soft dies next day.   Advise to follow with primary GI for repeat EGD in 6 weeks.   Also advised to discuss with his GI and PMD about safty of Sildenafil.  * Hypertension Hold losartan and furosemide, due to active bleed  * Thrombocytopenia Due to Liver cirrhosis Monitor.  * Liver cirrhosis Cont Inderal.  * DM Hold oral meds, as NPO Keep on ISS.   Blood sugar stable.  DISCHARGE CONDITIONS:   Stable.  CONSULTS OBTAINED:  Treatment Team:  [name], MD  DRUG ALLERGIES:   Allergies  Allergen Reactions  . Latex     LATEX TAPE  . Pineapple Hives    DISCHARGE MEDICATIONS:   Current Discharge Medication List    START taking these medications   Details  pantoprazole (PROTONIX) 40 MG tablet Take 1 tablet (40 mg total) by mouth 2 (two) times daily. Qty: 60 tablet, Refills: 0      CONTINUE these medications which have NOT CHANGED   Details  Canagliflozin-Metformin HCl ER (INVOKAMET XR) 50-1000 MG TB24 Take 1 tablet by mouth daily. Qty: 90 tablet, Refills: 3    furosemide (LASIX) 20 MG tablet 1-2 pills daily Qty: 180 tablet, Refills: 1   Associated Diagnoses: Pedal edema    propranolol (INDERAL) 10 MG tablet Take 10 mg by mouth 2 (two) times daily.    losartan (COZAAR) 25 MG tablet Take 1 tablet (25 mg total) by mouth daily. Qty: 90 tablet, Refills: 3   Associated Diagnoses: Diabetes mellitus type 2 in obese (HCC)    Melatonin 5 MG TABS Take 1 tablet (5 mg total) by mouth at bedtime. Qty: 30 tablet, Refills: 0   Associated Diagnoses: Other insomnia    mupirocin ointment (BACTROBAN) 2 % Place 1 application into the nose 2 (two) times daily. Qty: 22 g, Refills: 1   Associated Diagnoses: Abscess    promethazine (PHENERGAN) 25 MG suppository Place 1 suppository (25 mg total) rectally every 6 (six) hours as needed for nausea or vomiting. Qty: 12 each, Refills: 12    sildenafil (REVATIO) 20 MG tablet 2 to 4 tablets daily as needed Qty: 100 tablet, Refills: 5   Associated Diagnoses: Other male erectile dysfunction    tamsulosin (FLOMAX) 0.4 MG CAPS capsule Take 1 capsule (0.4 mg total) by mouth daily. Qty: 90 capsule, Refills: 3   Associated Diagnoses: Recurrent kidney  stones    Vitamin D, Ergocalciferol, (DRISDOL) 50000 units CAPS capsule Take 1 capsule (50,000 Units total) by mouth every 7 (seven) days. Qty: 12 capsule, Refills: 1   Associated Diagnoses: Vitamin D deficiency    zolpidem (AMBIEN) 10 MG tablet Take 1 tablet (10 mg total) by mouth at bedtime as needed for sleep. Qty: 30 tablet, Refills: 0   Associated Diagnoses: Insomnia, unspecified type         DISCHARGE INSTRUCTIONS:    Follow with PMD and in GI clinic in 2-3 weeks.  If you experience worsening of your admission symptoms, develop shortness of breath, life threatening  emergency, suicidal or homicidal thoughts you must seek medical attention immediately by calling 911 or calling your MD immediately  if symptoms less severe.  You Must read complete instructions/literature along with all the possible adverse reactions/side effects for all the Medicines you take and that have been prescribed to you. Take any new Medicines after you have completely understood and accept all the possible adverse reactions/side effects.   Please note  You were cared for by a hospitalist during your hospital stay. If you have any questions about your discharge medications or the care you received while you were in the hospital after you are discharged, you can call the unit and asked to speak with the hospitalist on call if the hospitalist that took care of you is not available. Once you are discharged, your primary care physician will handle any further medical issues. Please note that NO REFILLS for any discharge medications will be authorized once you are discharged, as it is imperative that you return to your primary care physician (or establish a relationship with a primary care physician if you do not have one) for your aftercare needs so that they can reassess your need for medications and monitor your lab values.    Today   CHIEF COMPLAINT:   Chief Complaint  Patient presents with  . Melena  . Emesis    HISTORY OF PRESENT ILLNESS:  Jesse Zimmerman  is a 56 y.o. male with a known history of NASH, Liver cirrhosis, Varices on esophagus- follows at [facility], Had some hard dark stool on Friday and yesterday morning had a dark vomit- took some phenergan- his vomiting stopped, but cont to have black colored liquidy stool every hour for whole day yesterday and feeling some dizzi, so came to ER. His Hb dropped some, but still 12, Vitals stable. ER started on Octreotide and Protonix drip and spoke to GI and gave for admission.  VITAL SIGNS:  Blood pressure 109/63, pulse 65, temperature  97.8 F (36.6 C), temperature source Oral, resp. rate 18, height 6' (1.829 m), weight (!) 141.1 kg (311 lb), SpO2 100 %.  I/O:   Intake/Output Summary (Last 24 hours) at 04/19/17 1359 Last data filed at 04/19/17 1013  Gross per 24 hour  Intake          1917.32 ml  Output                6 ml  Net          1911.32 ml    PHYSICAL EXAMINATION:  GENERAL:  56 y.o.-year-old patient lying in the bed with no acute distress.  EYES: Pupils equal, round, reactive to light and accommodation. No scleral icterus. Extraocular muscles intact.  HEENT: Head atraumatic, normocephalic. Oropharynx and nasopharynx clear.  NECK:  Supple, no jugular venous distention. No thyroid enlargement, no tenderness.  LUNGS: Normal breath sounds  bilaterally, no wheezing, rales,rhonchi or crepitation. No use of accessory muscles of respiration.  CARDIOVASCULAR: S1, S2 normal. No murmurs, rubs, or gallops.  ABDOMEN: Soft, non-tender, non-distended. Bowel sounds present. No organomegaly or mass.  EXTREMITIES: No pedal edema, cyanosis, or clubbing.  NEUROLOGIC: Cranial nerves II through XII are intact. Muscle strength 5/5 in all extremities. Sensation intact. Gait not checked.  PSYCHIATRIC: The patient is alert and oriented x 3.  SKIN: No obvious rash, lesion, or ulcer.   DATA REVIEW:   CBC  Recent Labs Lab 04/19/17 0753  WBC 3.6*  HGB 9.0*  HCT 26.0*  PLT 48*    Chemistries   Recent Labs Lab 04/17/17 0548 04/18/17 0047  NA 138 138  K 4.4 3.9  CL 108 114*  CO2 25 21*  GLUCOSE 215* 126*  BUN 42* 40*  CREATININE 0.89 0.91  CALCIUM 8.7* 7.8*  AST 39  --   ALT 30  --   ALKPHOS 91  --   BILITOT 2.5*  --     Cardiac Enzymes No results for input(s): TROPONINI in the last 168 hours.  Microbiology Results  Results for orders placed or performed in visit on 06/12/15  Urine culture     Status: None   Collection Time: 06/12/15 12:20 PM  Result Value Ref Range Status   Urine Culture, Routine Final  report  Final   Organism ID, Bacteria Comment  Final    Comment: Mixed urogenital flora 25,000-50,000 colony forming units per mL     RADIOLOGY:  No results found.  EKG:   Orders placed or performed in visit on 07/22/10  . EKG 12-Lead      Management plans discussed with the patient, family and they are in agreement.  CODE STATUS:     Code Status Orders        Start     Ordered   04/17/17 1016  Full code  Continuous     04/17/17 1015    Code Status History    Date Active Date Inactive Code Status Order ID Comments User Context   This patient has a current code status but no historical code status.    Advance Directive Documentation     Most Recent Value  Type of Advance Directive  Healthcare Power of Attorney  Pre-existing out of facility DNR order (yellow form or pink MOST form)  -  "MOST" Form in Place?  -      TOTAL TIME TAKING CARE OF THIS PATIENT: 35 minutes.    Vaughan Basta M.D on 04/19/2017 at 1:59 PM  Between 7am to 6pm - Pager - [phone]  After 6pm go to [email or web address] - password [email or web address] [facility]  Office  [phone]  CC: Primary care physician; Jerrol Banana., MD   Note: This dictation was prepared with Dragon dictation along with smaller phrase technology. Any transcriptional errors that result from this process are unintentional.

## 2017-04-19 NOTE — Discharge Instructions (Signed)
Follow with your GI doctor in 2-3 weeks.

## 2017-04-19 NOTE — Progress Notes (Signed)
Patient refused to go out in a wheelchair, walked out with his wife.

## 2017-04-19 NOTE — Progress Notes (Signed)
Discharge instruction given to patient, patient verbalized understanding and stated that he had no questions when asked. Patient discharged to home.

## 2017-04-19 NOTE — Telephone Encounter (Signed)
Please review. Everything so far is booked-Anastasiya [employer], RMA

## 2017-04-19 NOTE — Telephone Encounter (Signed)
[facility] called to schedule a 2 week hospital f/u. Pt is being discharged today 04/19/17 and was treated for upper GI bleed. When Can pt be worked into Dr. Alben Spittle schedule? Please advise. Thanks TNP

## 2017-04-20 ENCOUNTER — Encounter: Payer: Self-pay | Admitting: Gastroenterology

## 2017-04-20 NOTE — Telephone Encounter (Signed)
Next Wednesday the seventh 1145

## 2017-04-20 NOTE — Telephone Encounter (Signed)
Can you schedule pt?

## 2017-04-22 NOTE — Telephone Encounter (Signed)
Called pt and scheduled appt for 04/27/17 @ 11:45 am. Thanks TNP

## 2017-04-27 ENCOUNTER — Encounter: Payer: Self-pay | Admitting: Family Medicine

## 2017-04-27 ENCOUNTER — Ambulatory Visit: Payer: 59 | Admitting: Family Medicine

## 2017-04-27 VITALS — BP 138/68 | HR 70 | Temp 97.5°F | Resp 14 | Wt 313.0 lb

## 2017-04-27 DIAGNOSIS — I8510 Secondary esophageal varices without bleeding: Secondary | ICD-10-CM

## 2017-04-27 DIAGNOSIS — E1169 Type 2 diabetes mellitus with other specified complication: Secondary | ICD-10-CM | POA: Diagnosis not present

## 2017-04-27 DIAGNOSIS — E669 Obesity, unspecified: Secondary | ICD-10-CM

## 2017-04-27 DIAGNOSIS — K921 Melena: Secondary | ICD-10-CM

## 2017-04-27 DIAGNOSIS — D649 Anemia, unspecified: Secondary | ICD-10-CM | POA: Diagnosis not present

## 2017-04-27 DIAGNOSIS — Z1159 Encounter for screening for other viral diseases: Secondary | ICD-10-CM

## 2017-04-27 NOTE — Progress Notes (Signed)
Patient: Jesse Zimmerman Male    DOB: [birth year]   56 y.o.   MRN: [id number] Visit Date: 04/27/2017  Today's Provider: Wilhemena Durie, MD   Chief Complaint  Patient presents with  . Hospitalization Follow-up   Subjective:    HPI   Hospital stay 10/28-10/30  Dx: upper GI bleed        Blood in stool        Secondary esophageal varices with out bleeding        Portal HTN  Endoscopy performed- banding done start Protonix.   Pt reports that he feels weak and dizzy. He thinks it may be from the Protonix. His hemoglobin was down to 9.7 in the hospital and when he left they got it back up to 10.3 but then the last hemoglobin was 9.0.      Allergies  Allergen Reactions  . Latex     LATEX TAPE  . Pineapple Hives     Current Outpatient Medications:  .  Canagliflozin-Metformin HCl ER (INVOKAMET XR) 50-1000 MG TB24, Take 1 tablet by mouth daily., Disp: 90 tablet, Rfl: 3 .  furosemide (LASIX) 20 MG tablet, 1-2 pills daily, Disp: 180 tablet, Rfl: 1 .  pantoprazole (PROTONIX) 40 MG tablet, Take 1 tablet (40 mg total) by mouth 2 (two) times daily., Disp: 60 tablet, Rfl: 0 .  propranolol (INDERAL) 10 MG tablet, Take 10 mg by mouth 2 (two) times daily., Disp: , Rfl:  .  losartan (COZAAR) 25 MG tablet, Take 1 tablet (25 mg total) by mouth daily. (Patient not taking: Reported on 04/17/2017), Disp: 90 tablet, Rfl: 3 .  Melatonin 5 MG TABS, Take 1 tablet (5 mg total) by mouth at bedtime. (Patient not taking: Reported on 04/17/2017), Disp: 30 tablet, Rfl: 0 .  mupirocin ointment (BACTROBAN) 2 %, Place 1 application into the nose 2 (two) times daily. (Patient not taking: Reported on 04/27/2017), Disp: 22 g, Rfl: 1 .  promethazine (PHENERGAN) 25 MG suppository, Place 1 suppository (25 mg total) rectally every 6 (six) hours as needed for nausea or vomiting. (Patient not taking: Reported on 04/27/2017), Disp: 12 each, Rfl: 12 .  sildenafil (REVATIO) 20 MG tablet, 2 to 4 tablets daily as  needed (Patient not taking: Reported on 04/27/2017), Disp: 100 tablet, Rfl: 5 .  tamsulosin (FLOMAX) 0.4 MG CAPS capsule, Take 1 capsule (0.4 mg total) by mouth daily. (Patient not taking: Reported on 04/27/2017), Disp: 90 capsule, Rfl: 3 .  Vitamin D, Ergocalciferol, (DRISDOL) 50000 units CAPS capsule, Take 1 capsule (50,000 Units total) by mouth every 7 (seven) days. (Patient not taking: Reported on 04/27/2017), Disp: 12 capsule, Rfl: 1 .  zolpidem (AMBIEN) 10 MG tablet, Take 1 tablet (10 mg total) by mouth at bedtime as needed for sleep. (Patient not taking: Reported on 04/17/2017), Disp: 30 tablet, Rfl: 0  Review of Systems  Constitutional: Positive for fatigue.  HENT: Negative.   Eyes: Negative.   Respiratory: Negative.   Cardiovascular: Negative.   Gastrointestinal: Negative.   Endocrine: Negative.   Genitourinary: Negative.   Musculoskeletal: Negative.   Skin: Negative.   Allergic/Immunologic: Negative.   Neurological: Positive for weakness.  Hematological: Negative.   Psychiatric/Behavioral: Negative.     Social History   Tobacco Use  . Smoking status: Never Smoker  . Smokeless tobacco: Never Used  Substance Use Topics  . Alcohol use: No   Objective:   BP 138/68 (BP Location: Left Arm, Patient Position: Sitting, Cuff Size:  Large)   Pulse 70   Temp (!) 97.5 F (36.4 C) (Oral)   Resp 14   Wt (!) 313 lb (142 kg)   BMI 42.45 kg/m  Vitals:   04/27/17 1154  BP: 138/68  Pulse: 70  Resp: 14  Temp: (!) 97.5 F (36.4 C)  TempSrc: Oral  Weight: (!) 313 lb (142 kg)     Physical Exam  Constitutional: He is oriented to person, place, and time. He appears well-developed and well-nourished.  HENT:  Head: Normocephalic and atraumatic.  Eyes: Conjunctivae are normal. No scleral icterus.  Neck: No thyromegaly present.  Cardiovascular: Normal rate, regular rhythm and normal heart sounds.  Pulmonary/Chest: Effort normal and breath sounds normal.  Abdominal: Soft.    Neurological: He is alert and oriented to person, place, and time.  Skin: Skin is warm and dry.  Psychiatric: He has a normal mood and affect. His behavior is normal. Judgment and thought content normal.        Assessment & Plan:     1. Secondary esophageal varices without bleeding ([place]) Refer back to dr Allen Norris.  2. Blood in stool   3. Anemia, unspecified type  - CBC with Differential/Platelet - Iron  4. Diabetes mellitus type 2 in obese (HCC)  - Hemoglobin A1c  5. Need for hepatitis C screening test  - Hepatitis C Antibody 6.Morbid Obesity     I have done the exam and reviewed the above chart and it is accurate to the best of my knowledge. Development worker, community has been used in this note in any air is in the dictation or transcription are unintentional.  Wilhemena Durie, MD  [place]

## 2017-04-28 LAB — CBC WITH DIFFERENTIAL/PLATELET
Basophils Absolute: 0 10*3/uL (ref 0.0–0.2)
Basos: 1 %
EOS (ABSOLUTE): 0.2 10*3/uL (ref 0.0–0.4)
Eos: 5 %
Hematocrit: 30.9 % — ABNORMAL LOW (ref 37.5–51.0)
Hemoglobin: 10.3 g/dL — ABNORMAL LOW (ref 13.0–17.7)
Immature Grans (Abs): 0 10*3/uL (ref 0.0–0.1)
Immature Granulocytes: 0 %
Lymphocytes Absolute: 0.9 10*3/uL (ref 0.7–3.1)
Lymphs: 24 %
MCH: 32.1 pg (ref 26.6–33.0)
MCHC: 33.3 g/dL (ref 31.5–35.7)
MCV: 96 fL (ref 79–97)
Monocytes Absolute: 0.3 10*3/uL (ref 0.1–0.9)
Monocytes: 8 %
Neutrophils Absolute: 2.5 10*3/uL (ref 1.4–7.0)
Neutrophils: 62 %
Platelets: 60 10*3/uL — CL (ref 150–379)
RBC: 3.21 x10E6/uL — ABNORMAL LOW (ref 4.14–5.80)
RDW: 15.3 % (ref 12.3–15.4)
WBC: 4 10*3/uL (ref 3.4–10.8)

## 2017-04-28 LAB — HEMOGLOBIN A1C
Est. average glucose Bld gHb Est-mCnc: 160 mg/dL
Hgb A1c MFr Bld: 7.2 % — ABNORMAL HIGH (ref 4.8–5.6)

## 2017-04-28 LAB — HEPATITIS C ANTIBODY: Hep C Virus Ab: 0.1 s/co ratio (ref 0.0–0.9)

## 2017-04-28 LAB — IRON: Iron: 79 ug/dL (ref 38–169)

## 2017-05-19 ENCOUNTER — Ambulatory Visit: Payer: 59 | Admitting: Family Medicine

## 2017-05-25 ENCOUNTER — Ambulatory Visit: Payer: 59 | Admitting: Family Medicine

## 2017-05-25 ENCOUNTER — Encounter: Payer: Self-pay | Admitting: Family Medicine

## 2017-05-25 VITALS — BP 122/68 | HR 74 | Temp 98.6°F | Resp 16 | Wt 330.0 lb

## 2017-05-25 DIAGNOSIS — J40 Bronchitis, not specified as acute or chronic: Secondary | ICD-10-CM

## 2017-05-25 DIAGNOSIS — E669 Obesity, unspecified: Secondary | ICD-10-CM

## 2017-05-25 DIAGNOSIS — Z1211 Encounter for screening for malignant neoplasm of colon: Secondary | ICD-10-CM

## 2017-05-25 DIAGNOSIS — I10 Essential (primary) hypertension: Secondary | ICD-10-CM

## 2017-05-25 DIAGNOSIS — K922 Gastrointestinal hemorrhage, unspecified: Secondary | ICD-10-CM

## 2017-05-25 DIAGNOSIS — E1169 Type 2 diabetes mellitus with other specified complication: Secondary | ICD-10-CM

## 2017-05-25 DIAGNOSIS — I8510 Secondary esophageal varices without bleeding: Secondary | ICD-10-CM | POA: Diagnosis not present

## 2017-05-25 MED ORDER — AZITHROMYCIN 250 MG PO TABS: ORAL_TABLET | ORAL | 0 refills | Status: DC

## 2017-05-25 NOTE — Progress Notes (Signed)
Patient: Jesse Zimmerman Male    DOB: [birth year]   56 y.o.   MRN: [id number] Visit Date: 05/25/2017  Today's Provider: Wilhemena Durie, MD   Chief Complaint  Patient presents with  . Diabetes  . Anemia   Subjective:    HPI  Diabetes Mellitus Type II, Follow-up:   Lab Results  Component Value Date   HGBA1C 7.2 (H) 04/27/2017   HGBA1C 9.9 (H) 05/21/2015   HGBA1C 9.7 (H) 01/24/2015    Last seen for diabetes 1 months ago.  Management since then includes none. He reports good compliance with treatment.   Pertinent Labs:    Component Value Date/Time   CHOL 140 09/30/2014   TRIG 114 09/30/2014   HDL 42 09/30/2014   LDLCALC 105 09/30/2014   CREATININE 0.91 04/18/2017 0047   CREATININE 1.14 06/11/2013 0902    Wt Readings from Last 3 Encounters:  05/25/17 (!) 330 lb (149.7 kg)  04/27/17 (!) 313 lb (142 kg)  04/18/17 (!) 311 lb (141.1 kg)    ------------------------------------------------------------------------ Pt reports that he is feeling better. He started taking his Vitamin D again. However for about a week he has had a URI. Pt reports that he was given a powder for fungus in the hospital and would like a refill. Also needs a refill on protonix today.  Mildly productive cough without fever.     Allergies  Allergen Reactions  . Latex     LATEX TAPE  . Pineapple Hives     Current Outpatient Medications:  .  Canagliflozin-Metformin HCl ER (INVOKAMET XR) 50-1000 MG TB24, Take 1 tablet by mouth daily., Disp: 90 tablet, Rfl: 3 .  furosemide (LASIX) 20 MG tablet, 1-2 pills daily, Disp: 180 tablet, Rfl: 1 .  losartan (COZAAR) 25 MG tablet, Take 1 tablet (25 mg total) by mouth daily., Disp: 90 tablet, Rfl: 3 .  Vitamin D, Ergocalciferol, (DRISDOL) 50000 units CAPS capsule, Take 1 capsule (50,000 Units total) by mouth every 7 (seven) days., Disp: 12 capsule, Rfl: 1 .  Melatonin 5 MG TABS, Take 1 tablet (5 mg total) by mouth at bedtime. (Patient not  taking: Reported on 04/17/2017), Disp: 30 tablet, Rfl: 0 .  mupirocin ointment (BACTROBAN) 2 %, Place 1 application into the nose 2 (two) times daily. (Patient not taking: Reported on 04/27/2017), Disp: 22 g, Rfl: 1 .  pantoprazole (PROTONIX) 40 MG tablet, Take 1 tablet (40 mg total) by mouth 2 (two) times daily., Disp: 60 tablet, Rfl: 0 .  promethazine (PHENERGAN) 25 MG suppository, Place 1 suppository (25 mg total) rectally every 6 (six) hours as needed for nausea or vomiting. (Patient not taking: Reported on 04/27/2017), Disp: 12 each, Rfl: 12 .  propranolol (INDERAL) 10 MG tablet, Take 10 mg by mouth 2 (two) times daily., Disp: , Rfl:  .  sildenafil (REVATIO) 20 MG tablet, 2 to 4 tablets daily as needed (Patient not taking: Reported on 04/27/2017), Disp: 100 tablet, Rfl: 5 .  tamsulosin (FLOMAX) 0.4 MG CAPS capsule, Take 1 capsule (0.4 mg total) by mouth daily. (Patient not taking: Reported on 04/27/2017), Disp: 90 capsule, Rfl: 3 .  zolpidem (AMBIEN) 10 MG tablet, Take 1 tablet (10 mg total) by mouth at bedtime as needed for sleep. (Patient not taking: Reported on 04/17/2017), Disp: 30 tablet, Rfl: 0  Review of Systems  Constitutional: Negative.   HENT: Positive for congestion and ear pain.   Eyes: Negative.   Respiratory: Negative.   Cardiovascular: Negative.  Gastrointestinal: Negative.   Endocrine: Negative.   Genitourinary: Negative.   Musculoskeletal: Negative.   Skin: Negative.   Allergic/Immunologic: Negative.   Neurological: Negative.   Hematological: Negative.   Psychiatric/Behavioral: Negative.     Social History   Tobacco Use  . Smoking status: Never Smoker  . Smokeless tobacco: Never Used  Substance Use Topics  . Alcohol use: No   Objective:   BP 122/68 (BP Location: Left Arm, Patient Position: Sitting, Cuff Size: Large)   Pulse 74   Temp 98.6 F (37 C) (Oral)   Resp 16   Wt (!) 330 lb (149.7 kg)   SpO2 99%   BMI 44.76 kg/m  Vitals:   05/25/17 1640  BP:  122/68  Pulse: 74  Resp: 16  Temp: 98.6 F (37 C)  TempSrc: Oral  SpO2: 99%  Weight: (!) 330 lb (149.7 kg)     Physical Exam  Constitutional: He is oriented to person, place, and time. He appears well-developed and well-nourished.  Obese WM NAD.  HENT:  Head: Normocephalic and atraumatic.  Eyes: Conjunctivae are normal. No scleral icterus.  Neck: No thyromegaly present.  Cardiovascular: Normal rate, regular rhythm and normal heart sounds.  Pulmonary/Chest: Effort normal and breath sounds normal.  Abdominal: Soft.  Lymphadenopathy:    He has no cervical adenopathy.  Neurological: He is alert and oriented to person, place, and time.  Skin: Skin is warm and dry.  Psychiatric: He has a normal mood and affect. His behavior is normal. Judgment and thought content normal.        Assessment & Plan:     1. Essential hypertension   2. Secondary esophageal varices without bleeding (HCC)   3. Upper GI bleed Per GI.  4. Diabetes mellitus type 2 in obese (HCC) Recheck 2 months.  5. Encounter for screening colonoscopy  - Ambulatory referral to Gastroenterology  6. Bronchitis  - azithromycin (ZITHROMAX) 250 MG tablet; Take 2 on the first day then 1 daily until finished.  Dispense: 6 tablet; Refill: 0       Richard Cranford Mon, MD  [facility]

## 2017-05-31 ENCOUNTER — Other Ambulatory Visit: Payer: Self-pay | Admitting: Family Medicine

## 2017-06-24 ENCOUNTER — Other Ambulatory Visit: Payer: Self-pay

## 2017-06-24 DIAGNOSIS — Z1211 Encounter for screening for malignant neoplasm of colon: Secondary | ICD-10-CM

## 2017-07-14 ENCOUNTER — Other Ambulatory Visit: Payer: Self-pay | Admitting: Family Medicine

## 2017-07-14 DIAGNOSIS — E559 Vitamin D deficiency, unspecified: Secondary | ICD-10-CM

## 2017-07-14 MED ORDER — VITAMIN D (ERGOCALCIFEROL) 1.25 MG (50000 UNIT) PO CAPS: 50000.0000 [IU] | ORAL_CAPSULE | ORAL | 3 refills | Status: DC

## 2017-07-14 NOTE — Telephone Encounter (Signed)
Pt. Needs refills on Vitamin D 50,0000 units called to [facility] on [address].

## 2017-07-14 NOTE — Telephone Encounter (Signed)
Pt's wife Velva Harman called to see if Rx for Vitamin D had been sent to the pharmacy. Velva Harman was advised refill request can take 24 to 48 hours to process. Please advise. Thanks TNP

## 2017-07-22 DIAGNOSIS — K7460 Unspecified cirrhosis of liver: Secondary | ICD-10-CM | POA: Diagnosis not present

## 2017-07-26 ENCOUNTER — Telehealth: Payer: Self-pay | Admitting: Gastroenterology

## 2017-07-26 ENCOUNTER — Other Ambulatory Visit: Payer: Self-pay

## 2017-07-26 MED ORDER — NA SULFATE-K SULFATE-MG SULF 17.5-3.13-1.6 GM/177ML PO SOLN: 1.0000 | Freq: Once | ORAL | 0 refills | Status: AC

## 2017-07-26 NOTE — Telephone Encounter (Signed)
rx for su-prep sent to pharmacy.

## 2017-07-26 NOTE — Telephone Encounter (Signed)
Send in bowel prep to [facility]. His Rx wasn't included in the prepping packet.

## 2017-08-02 ENCOUNTER — Telehealth: Payer: Self-pay | Admitting: Gastroenterology

## 2017-08-02 NOTE — Telephone Encounter (Signed)
Please call patient as he has sever medical issues he needs to discuss before his procedure on 08/12/17 with Dr. Vicente Males.

## 2017-08-10 DIAGNOSIS — K8020 Calculus of gallbladder without cholecystitis without obstruction: Secondary | ICD-10-CM | POA: Diagnosis not present

## 2017-08-10 DIAGNOSIS — K7460 Unspecified cirrhosis of liver: Secondary | ICD-10-CM | POA: Diagnosis not present

## 2017-08-10 DIAGNOSIS — K766 Portal hypertension: Secondary | ICD-10-CM | POA: Diagnosis not present

## 2017-08-10 DIAGNOSIS — K7469 Other cirrhosis of liver: Secondary | ICD-10-CM | POA: Diagnosis not present

## 2017-08-10 DIAGNOSIS — I8500 Esophageal varices without bleeding: Secondary | ICD-10-CM | POA: Diagnosis not present

## 2017-08-10 DIAGNOSIS — N200 Calculus of kidney: Secondary | ICD-10-CM | POA: Diagnosis not present

## 2017-08-10 DIAGNOSIS — K7581 Nonalcoholic steatohepatitis (NASH): Secondary | ICD-10-CM | POA: Diagnosis not present

## 2017-08-12 ENCOUNTER — Encounter
Admission: RE | Disposition: A | Discharge status: Home / Self Care | Payer: Self-pay | Source: Ambulatory Visit | Attending: Gastroenterology

## 2017-08-12 ENCOUNTER — Ambulatory Visit
Admission: RE | Admit: 2017-08-12 | Discharge: 2017-08-12 | Disposition: A | Discharge status: Home / Self Care | Payer: BLUE CROSS/BLUE SHIELD | Source: Ambulatory Visit | Attending: Gastroenterology | Admitting: Gastroenterology

## 2017-08-12 ENCOUNTER — Ambulatory Visit: Payer: BLUE CROSS/BLUE SHIELD | Admitting: Anesthesiology

## 2017-08-12 DIAGNOSIS — Z79899 Other long term (current) drug therapy: Secondary | ICD-10-CM | POA: Diagnosis not present

## 2017-08-12 DIAGNOSIS — K640 First degree hemorrhoids: Secondary | ICD-10-CM | POA: Diagnosis not present

## 2017-08-12 DIAGNOSIS — E785 Hyperlipidemia, unspecified: Secondary | ICD-10-CM | POA: Insufficient documentation

## 2017-08-12 DIAGNOSIS — D120 Benign neoplasm of cecum: Secondary | ICD-10-CM

## 2017-08-12 DIAGNOSIS — I10 Essential (primary) hypertension: Secondary | ICD-10-CM | POA: Insufficient documentation

## 2017-08-12 DIAGNOSIS — Z800 Family history of malignant neoplasm of digestive organs: Secondary | ICD-10-CM | POA: Diagnosis not present

## 2017-08-12 DIAGNOSIS — Z7984 Long term (current) use of oral hypoglycemic drugs: Secondary | ICD-10-CM | POA: Diagnosis not present

## 2017-08-12 DIAGNOSIS — Z86010 Personal history of colonic polyps: Secondary | ICD-10-CM | POA: Diagnosis not present

## 2017-08-12 DIAGNOSIS — D123 Benign neoplasm of transverse colon: Secondary | ICD-10-CM

## 2017-08-12 DIAGNOSIS — K635 Polyp of colon: Secondary | ICD-10-CM | POA: Insufficient documentation

## 2017-08-12 DIAGNOSIS — K649 Unspecified hemorrhoids: Secondary | ICD-10-CM | POA: Diagnosis not present

## 2017-08-12 DIAGNOSIS — K219 Gastro-esophageal reflux disease without esophagitis: Secondary | ICD-10-CM | POA: Diagnosis not present

## 2017-08-12 DIAGNOSIS — Z1211 Encounter for screening for malignant neoplasm of colon: Secondary | ICD-10-CM | POA: Diagnosis not present

## 2017-08-12 DIAGNOSIS — E119 Type 2 diabetes mellitus without complications: Secondary | ICD-10-CM | POA: Insufficient documentation

## 2017-08-12 HISTORY — PX: COLONOSCOPY WITH PROPOFOL: SHX5780

## 2017-08-12 LAB — GLUCOSE, CAPILLARY: Glucose-Capillary: 139 mg/dL — ABNORMAL HIGH (ref 65–99)

## 2017-08-12 SURGERY — COLONOSCOPY WITH PROPOFOL
Anesthesia: General

## 2017-08-12 MED ORDER — SODIUM CHLORIDE 0.9 % IV SOLN
INTRAVENOUS | Status: DC
  Administered 2017-08-12: 1000 mL via INTRAVENOUS

## 2017-08-12 MED ORDER — PROPOFOL 500 MG/50ML IV EMUL
INTRAVENOUS | Status: AC
  Filled 2017-08-12: qty 50

## 2017-08-12 MED ORDER — LIDOCAINE HCL (PF) 1 % IJ SOLN
2.0000 mL | Freq: Once | INTRAMUSCULAR | Status: AC
  Administered 2017-08-12: 0.3 mL via INTRADERMAL

## 2017-08-12 MED ORDER — PROPOFOL 10 MG/ML IV BOLUS
INTRAVENOUS | Status: DC | PRN
  Administered 2017-08-12: 80 mg via INTRAVENOUS
  Administered 2017-08-12: 20 mg via INTRAVENOUS

## 2017-08-12 MED ORDER — PHENYLEPHRINE HCL 10 MG/ML IJ SOLN
INTRAMUSCULAR | Status: DC | PRN
  Administered 2017-08-12: 100 ug via INTRAVENOUS

## 2017-08-12 MED ORDER — LIDOCAINE HCL (PF) 1 % IJ SOLN
INTRAMUSCULAR | Status: AC
  Administered 2017-08-12: 0.3 mL via INTRADERMAL
  Filled 2017-08-12: qty 2

## 2017-08-12 MED ORDER — PROPOFOL 500 MG/50ML IV EMUL
INTRAVENOUS | Status: DC | PRN
  Administered 2017-08-12: 150 ug/kg/min via INTRAVENOUS

## 2017-08-12 NOTE — Op Note (Signed)
[facility] Gastroenterology Patient Name: Jesse Zimmerman Procedure Date: 08/12/2017 8:08 AM MRN: [id number] Account #: [id number] Date of Birth: April 28, 1961 Admit Type: Outpatient Age: 57 Room: [facility] ENDO ROOM 4 Gender: Male Note Status: Finalized Procedure:            Colonoscopy Indications:          High risk colon cancer surveillance: Personal history                        of colonic polyps Providers:            Jonathon Bellows MD, MD Referring MD:         Janine Ores. Rosanna Randy, MD (Referring MD) Medicines:            Monitored Anesthesia Care Complications:        No immediate complications. Procedure:            Pre-Anesthesia Assessment:                       - Prior to the procedure, a History and Physical was                        performed, and patient medications, allergies and                        sensitivities were reviewed. The patient's tolerance of                        previous anesthesia was reviewed.                       - The risks and benefits of the procedure and the                        sedation options and risks were discussed with the                        patient. All questions were answered and informed                        consent was obtained.                       - ASA Grade Assessment: III - A patient with severe                        systemic disease.                       After obtaining informed consent, the colonoscope was                        passed under direct vision. Throughout the procedure,                        the patient's blood pressure, pulse, and oxygen                        saturations were monitored continuously. The  Colonoscope was introduced through the anus and                        advanced to the the cecum, identified by the                        appendiceal orifice, IC valve and transillumination.                        The colonoscopy was performed with ease. The patient                      tolerated the procedure well. The quality of the bowel                        preparation was good. Findings:      The perianal and digital rectal examinations were normal.      Non-bleeding internal hemorrhoids were found during retroflexion. The       hemorrhoids were medium-sized and Grade I (internal hemorrhoids that do       not prolapse).      A 3 mm polyp was found in the cecum. The polyp was sessile. The polyp       was removed with a cold biopsy forceps. Resection and retrieval were       complete.      A 5 mm polyp was found in the transverse colon. The polyp was sessile.       The polyp was removed with a cold biopsy forceps. Resection and       retrieval were complete. To prevent bleeding after the polypectomy, one       hemostatic clip was successfully placed. There was no bleeding at the       end of the maneuver.      The exam was otherwise without abnormality on direct and retroflexion       views. Impression:           - Non-bleeding internal hemorrhoids.                       - One 3 mm polyp in the cecum, removed with a cold                        biopsy forceps. Resected and retrieved.                       - One 5 mm polyp in the transverse colon, removed with                        a cold biopsy forceps. Resected and retrieved. Clip was                        placed.                       - The examination was otherwise normal on direct and                        retroflexion views. Recommendation:       - Discharge patient to home (with escort).                       -  Resume previous diet.                       - Continue present medications.                       - Await pathology results.                       - Repeat colonoscopy in 5 years for surveillance. Procedure Code(s):    --- Professional ---                       [phone], Colonoscopy, flexible; with biopsy, single or                        multiple Diagnosis Code(s):    ---  Professional ---                       Z86.010, Personal history of colonic polyps                       D12.0, Benign neoplasm of cecum                       D12.3, Benign neoplasm of transverse colon (hepatic                        flexure or splenic flexure)                       K64.0, First degree hemorrhoids CPT copyright 2016 American Medical Association. All rights reserved. The codes documented in this report are preliminary and upon coder review may  be revised to meet current compliance requirements. Jonathon Bellows, MD Jonathon Bellows MD, MD 08/12/2017 8:37:07 AM This report has been signed electronically. Number of Addenda: 0 Note Initiated On: 08/12/2017 8:08 AM Scope Withdrawal Time: 0 hours 14 minutes 0 seconds  Total Procedure Duration: 0 hours 20 minutes 19 seconds       [facility]

## 2017-08-12 NOTE — Anesthesia Preprocedure Evaluation (Signed)
Anesthesia Evaluation  Patient identified by MRN, date of birth, ID band Patient awake    Reviewed: Allergy & Precautions, H&P , NPO status , Patient's Chart, lab work & pertinent test results, reviewed documented beta blocker date and time   Airway Mallampati: II   Neck ROM: full    Dental  (+) Poor Dentition   Pulmonary neg pulmonary ROS,    Pulmonary exam normal        Cardiovascular Exercise Tolerance: Poor hypertension, On Medications negative cardio ROS Normal cardiovascular exam Rhythm:regular Rate:Normal     Neuro/Psych negative neurological ROS  negative psych ROS   GI/Hepatic negative GI ROS, Neg liver ROS, GERD  Medicated,(+) Hepatitis -  Endo/Other  negative endocrine ROSdiabetes  Renal/GU negative Renal ROS  negative genitourinary   Musculoskeletal   Abdominal   Peds  Hematology negative hematology ROS (+)   Anesthesia Other Findings Past Medical History: No date: Chronic back pain No date: Cirrhosis (HCC)     Comment:  NASH No date: Diabetes mellitus without complication (HCC) No date: Esophageal varices (HCC) No date: Hyperlipidemia No date: NASH (nonalcoholic steatohepatitis) Past Surgical History: 04/18/2017: ESOPHAGOGASTRODUODENOSCOPY (EGD) WITH PROPOFOL; N/A     Comment:  Procedure: ESOPHAGOGASTRODUODENOSCOPY (EGD) WITH               PROPOFOL;  Surgeon: [name], MD;  Location: [facility]               ENDOSCOPY;  Service: Endoscopy;  Laterality: N/A; No date: EXCESSIVE THIGH / HIP / BUTTOCK / FLANK SKIN EXCISION     Comment:  PART OF INNER THIGH/DUE TO SEPSIS INFECTION REMOVED No date: KIDNEY STONE SURGERY     Comment:  removed No date: TONSILLECTOMY AND ADENOIDECTOMY   Reproductive/Obstetrics negative OB ROS                             Anesthesia Physical Anesthesia Plan  ASA: IV  Anesthesia Plan: General   Post-op Pain Management:    Induction:    PONV Risk Score and Plan:   Airway Management Planned:   Additional Equipment:   Intra-op Plan:   Post-operative Plan:   Informed Consent: I have reviewed the patients History and Physical, chart, labs and discussed the procedure including the risks, benefits and alternatives for the proposed anesthesia with the patient or authorized representative who has indicated his/her understanding and acceptance.   Dental Advisory Given  Plan Discussed with: CRNA  Anesthesia Plan Comments:         Anesthesia Quick Evaluation

## 2017-08-12 NOTE — Transfer of Care (Signed)
Immediate Anesthesia Transfer of Care Note  Patient: Jesse Zimmerman  Procedure(s) Performed: COLONOSCOPY WITH PROPOFOL (N/A )  Patient Location: PACU  Anesthesia Type:General  Level of Consciousness: sedated  Airway & Oxygen Therapy: Patient Spontanous Breathing and Patient connected to nasal cannula oxygen  Post-op Assessment: Report given to RN and Post -op Vital signs reviewed and stable  Post vital signs: Reviewed and stable  Last Vitals:  Vitals:   08/12/17 0741  BP: 134/70  Pulse: 73  Resp: 17  Temp: (!) 36.1 C  SpO2: 100%    Last Pain:  Vitals:   08/12/17 0741  TempSrc: Tympanic         Complications: No apparent anesthesia complications

## 2017-08-12 NOTE — H&P (Signed)
Jonathon Bellows, MD [address], [place], [place], [place], [address] [address], [place], [place], [place], [address] Phone: [phone]  Fax: [phone]  Primary Care Physician:  Jerrol Banana., MD   Pre-Procedure History & Physical: HPI:  Jesse Zimmerman is a 57 y.o. male is here for an colonoscopy.   Past Medical History:  Diagnosis Date  . Chronic back pain   . Cirrhosis ([place])    NASH  . Diabetes mellitus without complication ([place])   . Esophageal varices ([place])   . Hyperlipidemia   . NASH (nonalcoholic steatohepatitis)     Past Surgical History:  Procedure Laterality Date  . ESOPHAGOGASTRODUODENOSCOPY (EGD) WITH PROPOFOL N/A 04/18/2017   Procedure: ESOPHAGOGASTRODUODENOSCOPY (EGD) WITH PROPOFOL;  Surgeon: [name], MD;  [facility] END[facility]COPY;  Service: Endoscopy;  Laterality: N/A;  . EXCESSIVE THIGH / HIP / BUTTOCK / FLANK SKIN EXCISION     PART OF INNER THIGH/DUE TO SEPSIS INFECTION REMOVED  . KIDNEY STONE SURGERY     removed  . TONSILLECTOMY AND ADENOIDECTOMY      Prior to Admission medications   Medication Sig Start Date End Date Taking? Authorizing Provider  azithromycin (ZITHROMAX) 250 MG tablet Take 2 on the first day then 1 daily until finished. 05/25/17   Jerrol Banana., MD  furosemide (LASIX) 20 MG tablet 1-2 pills daily 11/10/16   Jerrol Banana., MD  INVOKAMET XR 50-1000 MG TB24 TAKE 1 TABLET BY MOUTH  DAILY 05/31/17   Jerrol Banana., MD  losartan (COZAAR) 25 MG tablet Take 1 tablet (25 mg total) by mouth daily. 02/03/17   Jerrol Banana., MD  Melatonin 5 MG TABS Take 1 tablet (5 mg total) by mouth at bedtime. Patient not taking: Reported on 04/17/2017 02/03/17   Jerrol Banana., MD  mupirocin ointment (BACTROBAN) 2 % Place 1 application into the nose 2 (two) times daily. Patient not taking: Reported on 04/27/2017 03/30/17   Jerrol Banana., MD  pantoprazole (PROTONIX) 40 MG tablet Take 1 tablet (40  mg total) by mouth 2 (two) times daily. 04/19/17   Vaughan Basta, MD  promethazine (PHENERGAN) 25 MG suppository Place 1 suppository (25 mg total) rectally every 6 (six) hours as needed for nausea or vomiting. Patient not taking: Reported on 04/27/2017 04/02/15   Jerrol Banana., MD  propranolol (INDERAL) 10 MG tablet Take 10 mg by mouth 2 (two) times daily.    [provider]  sildenafil (REVATIO) 20 MG tablet 2 to 4 tablets daily as needed Patient not taking: Reported on 04/27/2017 02/03/17   Jerrol Banana., MD  tamsulosin (FLOMAX) 0.4 MG CAPS capsule Take 1 capsule (0.4 mg total) by mouth daily. Patient not taking: Reported on 04/27/2017 02/03/17   Jerrol Banana., MD  Vitamin D, Ergocalciferol, (DRISDOL) 50000 units CAPS capsule Take 1 capsule (50,000 Units total) by mouth every 7 (seven) days. 07/14/17   Jerrol Banana., MD  zolpidem (AMBIEN) 10 MG tablet Take 1 tablet (10 mg total) by mouth at bedtime as needed for sleep. Patient not taking: Reported on 04/17/2017 03/30/17 04/29/17  Jerrol Banana., MD    Allergies as of 06/24/2017 - Review Complete 04/27/2017  Allergen Reaction Noted  . Latex  11/22/2014  . Pineapple Hives 11/22/2014    Family History  Problem Relation Age of Onset  . Hypertension Mother   . Breast cancer Mother   . Dementia Mother   .  Heart attack Father   . Gout Father   . Hypertension Father   . Hypertension Sister   . Hypertension Sister   . Colon cancer Maternal Aunt   . Colon cancer Maternal Grandmother   . Colon cancer Paternal Grandmother     Social History   Socioeconomic History  . Marital status: Married    Spouse name: Velva Harman  . Number of children: 0  . Years of education: college  . Highest education level: Not on file  Social Needs  . Financial resource strain: Not on file  . Food insecurity - worry: Not on file  . Food insecurity - inability: Not on file  . Transportation needs - medical:  Not on file  . Transportation needs - non-medical: Not on file  Occupational History  . Occupation: work at [employer]  . Smoking status: Never Smoker  . Smokeless tobacco: Never Used  Substance and Sexual Activity  . Alcohol use: No  . Drug use: No  . Sexual activity: Not Currently  Other Topics Concern  . Not on file  Social History Narrative  . Not on file    Review of Systems: See HPI, otherwise negative ROS  Physical Exam: BP 134/70   Pulse 73   Temp (!) 97 F (36.1 C) (Tympanic)   Resp 17   Ht 6' (1.829 m)   Wt 300 lb (136.1 kg)   SpO2 100%   BMI 40.69 kg/m  General:   Alert,  pleasant and cooperative in NAD Head:  Normocephalic and atraumatic. Neck:  Supple; no masses or thyromegaly. Lungs:  Clear throughout to auscultation, normal respiratory effort.    Heart:  +S1, +S2, Regular rate and rhythm, No edema. Abdomen:  Soft, nontender and nondistended. Normal bowel sounds, without guarding, and without rebound.   Neurologic:  Alert and  oriented x4;  grossly normal neurologically.  Impression/Plan: Jesse Zimmerman is here for an colonoscopy to be performed for surveillance due to prior history of colon polyps   Risks, benefits, limitations, and alternatives regarding  colonoscopy have been reviewed with the patient.  Questions have been answered.  All parties agreeable.   Jonathon Bellows, MD  08/12/2017, 8:09 AM

## 2017-08-12 NOTE — Anesthesia Procedure Notes (Signed)
Date/Time: 08/12/2017 8:21 AM Performed by: Nelda Marseille, CRNA Pre-anesthesia Checklist: Patient identified, Emergency Drugs available, Suction available, Patient being monitored and Timeout performed

## 2017-08-12 NOTE — Anesthesia Post-op Follow-up Note (Signed)
Anesthesia QCDR form completed.        

## 2017-08-15 LAB — SURGICAL PATHOLOGY

## 2017-08-16 ENCOUNTER — Encounter: Payer: Self-pay | Admitting: Gastroenterology

## 2017-08-17 NOTE — Anesthesia Postprocedure Evaluation (Signed)
Anesthesia Post Note  Patient: Jesse Zimmerman  Procedure(s) Performed: COLONOSCOPY WITH PROPOFOL (N/A )  Patient location during evaluation: PACU Anesthesia Type: General Level of consciousness: awake and alert Pain management: pain level controlled Vital Signs Assessment: post-procedure vital signs reviewed and stable Respiratory status: spontaneous breathing, nonlabored ventilation, respiratory function stable and patient connected to nasal cannula oxygen Cardiovascular status: blood pressure returned to baseline and stable Postop Assessment: no apparent nausea or vomiting Anesthetic complications: no     Last Vitals:  Vitals:   08/12/17 0850 08/12/17 0900  BP: (!) 111/53 (!) 125/58  Pulse: 72 68  Resp: 17 15  Temp:    SpO2: 100% 100%    Last Pain:  Vitals:   08/12/17 0840  TempSrc: Tympanic                 Molli Barrows

## 2017-08-18 DIAGNOSIS — K7581 Nonalcoholic steatohepatitis (NASH): Secondary | ICD-10-CM | POA: Diagnosis not present

## 2017-08-18 DIAGNOSIS — K31811 Angiodysplasia of stomach and duodenum with bleeding: Secondary | ICD-10-CM | POA: Diagnosis not present

## 2017-08-18 DIAGNOSIS — I8510 Secondary esophageal varices without bleeding: Secondary | ICD-10-CM | POA: Diagnosis not present

## 2017-08-18 DIAGNOSIS — G4730 Sleep apnea, unspecified: Secondary | ICD-10-CM | POA: Diagnosis not present

## 2017-08-18 DIAGNOSIS — K7460 Unspecified cirrhosis of liver: Secondary | ICD-10-CM | POA: Diagnosis not present

## 2017-08-18 DIAGNOSIS — I8500 Esophageal varices without bleeding: Secondary | ICD-10-CM | POA: Diagnosis not present

## 2017-08-18 DIAGNOSIS — E119 Type 2 diabetes mellitus without complications: Secondary | ICD-10-CM | POA: Diagnosis not present

## 2017-08-18 DIAGNOSIS — K317 Polyp of stomach and duodenum: Secondary | ICD-10-CM | POA: Diagnosis not present

## 2017-08-18 DIAGNOSIS — K766 Portal hypertension: Secondary | ICD-10-CM | POA: Diagnosis not present

## 2017-08-18 DIAGNOSIS — K3189 Other diseases of stomach and duodenum: Secondary | ICD-10-CM | POA: Diagnosis not present

## 2017-08-18 DIAGNOSIS — Z7984 Long term (current) use of oral hypoglycemic drugs: Secondary | ICD-10-CM | POA: Diagnosis not present

## 2017-08-18 DIAGNOSIS — Z79899 Other long term (current) drug therapy: Secondary | ICD-10-CM | POA: Diagnosis not present

## 2017-08-21 ENCOUNTER — Encounter: Payer: Self-pay | Admitting: Gastroenterology

## 2017-08-22 ENCOUNTER — Other Ambulatory Visit: Payer: Self-pay

## 2017-09-22 ENCOUNTER — Encounter: Payer: Self-pay | Admitting: Family Medicine

## 2017-09-22 DIAGNOSIS — K254 Chronic or unspecified gastric ulcer with hemorrhage: Secondary | ICD-10-CM | POA: Diagnosis not present

## 2017-09-22 DIAGNOSIS — Z79899 Other long term (current) drug therapy: Secondary | ICD-10-CM | POA: Diagnosis not present

## 2017-09-22 DIAGNOSIS — K31819 Angiodysplasia of stomach and duodenum without bleeding: Secondary | ICD-10-CM | POA: Diagnosis not present

## 2017-09-22 DIAGNOSIS — I8500 Esophageal varices without bleeding: Secondary | ICD-10-CM | POA: Diagnosis not present

## 2017-09-22 DIAGNOSIS — K259 Gastric ulcer, unspecified as acute or chronic, without hemorrhage or perforation: Secondary | ICD-10-CM | POA: Diagnosis not present

## 2017-09-23 ENCOUNTER — Encounter: Payer: Self-pay | Admitting: Emergency Medicine

## 2017-09-23 NOTE — Progress Notes (Signed)
error 

## 2017-11-16 DIAGNOSIS — I8500 Esophageal varices without bleeding: Secondary | ICD-10-CM | POA: Diagnosis not present

## 2017-11-16 DIAGNOSIS — K7469 Other cirrhosis of liver: Secondary | ICD-10-CM | POA: Diagnosis not present

## 2017-11-16 DIAGNOSIS — K257 Chronic gastric ulcer without hemorrhage or perforation: Secondary | ICD-10-CM | POA: Diagnosis not present

## 2017-11-16 DIAGNOSIS — N200 Calculus of kidney: Secondary | ICD-10-CM | POA: Diagnosis not present

## 2017-11-16 DIAGNOSIS — K766 Portal hypertension: Secondary | ICD-10-CM | POA: Diagnosis not present

## 2017-12-21 ENCOUNTER — Ambulatory Visit: Payer: BLUE CROSS/BLUE SHIELD | Admitting: Family Medicine

## 2017-12-21 ENCOUNTER — Encounter: Payer: Self-pay | Admitting: Family Medicine

## 2017-12-21 VITALS — BP 122/70 | HR 72 | Temp 98.0°F | Resp 16 | Wt 314.2 lb

## 2017-12-21 DIAGNOSIS — E538 Deficiency of other specified B group vitamins: Secondary | ICD-10-CM | POA: Diagnosis not present

## 2017-12-21 DIAGNOSIS — E1169 Type 2 diabetes mellitus with other specified complication: Secondary | ICD-10-CM | POA: Diagnosis not present

## 2017-12-21 DIAGNOSIS — E785 Hyperlipidemia, unspecified: Secondary | ICD-10-CM | POA: Diagnosis not present

## 2017-12-21 DIAGNOSIS — K649 Unspecified hemorrhoids: Secondary | ICD-10-CM

## 2017-12-21 DIAGNOSIS — I10 Essential (primary) hypertension: Secondary | ICD-10-CM | POA: Diagnosis not present

## 2017-12-21 DIAGNOSIS — K7469 Other cirrhosis of liver: Secondary | ICD-10-CM | POA: Diagnosis not present

## 2017-12-21 DIAGNOSIS — R05 Cough: Secondary | ICD-10-CM | POA: Diagnosis not present

## 2017-12-21 DIAGNOSIS — E559 Vitamin D deficiency, unspecified: Secondary | ICD-10-CM | POA: Diagnosis not present

## 2017-12-21 DIAGNOSIS — T753XXA Motion sickness, initial encounter: Secondary | ICD-10-CM | POA: Diagnosis not present

## 2017-12-21 DIAGNOSIS — E669 Obesity, unspecified: Secondary | ICD-10-CM

## 2017-12-21 DIAGNOSIS — R059 Cough, unspecified: Secondary | ICD-10-CM

## 2017-12-21 DIAGNOSIS — N529 Male erectile dysfunction, unspecified: Secondary | ICD-10-CM

## 2017-12-21 DIAGNOSIS — N200 Calculus of kidney: Secondary | ICD-10-CM | POA: Diagnosis not present

## 2017-12-21 MED ORDER — ONDANSETRON HCL 4 MG PO TABS: 4.0000 mg | ORAL_TABLET | Freq: Three times a day (TID) | ORAL | 1 refills | Status: DC | PRN

## 2017-12-21 MED ORDER — HYDROCORTISONE ACETATE 25 MG RE SUPP: 25.0000 mg | Freq: Every evening | RECTAL | 5 refills | Status: DC | PRN

## 2017-12-21 MED ORDER — HYDROCODONE-ACETAMINOPHEN 10-325 MG PO TABS: 1.0000 | ORAL_TABLET | ORAL | 0 refills | Status: DC | PRN

## 2017-12-21 MED ORDER — SCOPOLAMINE 1 MG/3DAYS TD PT72: 1.0000 | MEDICATED_PATCH | TRANSDERMAL | 12 refills | Status: DC

## 2017-12-21 NOTE — Patient Instructions (Addendum)
Robitussin OTC 600 mg Twice daily for cough  Take 1/2 pill of Hydrocodone

## 2017-12-21 NOTE — Progress Notes (Signed)
Patient: Jesse Zimmerman Male    DOB: 12-28-1960   57 y.o.   MRN: [id number] Visit Date: 12/21/2017  Today's Provider: Wilhemena Durie, MD   Chief Complaint  Patient presents with  . Fatigue  . Cough  . Hemorrhoids   Subjective:    Cough  This is a recurrent problem. The current episode started more than 1 month ago. The problem has been unchanged. The problem occurs constantly. The cough is non-productive. Associated symptoms include ear pain, postnasal drip, rhinorrhea and a sore throat. Pertinent negatives include no chills, fever, shortness of breath or wheezing. Nothing aggravates the symptoms. He has tried nothing for the symptoms.   Hemorrhoid Patient states that he has had external hemorrhoids present intermittent since October, patient states that he usually controlled this by using Preparation H cream but states in the past 2-3 months he has had rectal bleeding.  Fatigue Patient states that for the past 2-3 months he has had decreased energy, patient states that he has been battling with kidney stones and wasn't sure if it was related. Cirrhosis due to NASH Pt has had 3 EGDs this year      Allergies  Allergen Reactions  . Latex     LATEX TAPE  . Pineapple Hives     Current Outpatient Medications:  .  CANAGLIFLOZIN PO, Take 1,000 mg by mouth daily at 2 PM., Disp: , Rfl:  .  furosemide (LASIX) 20 MG tablet, 1-2 pills daily, Disp: 180 tablet, Rfl: 1 .  pantoprazole (PROTONIX) 40 MG tablet, Take 1 tablet (40 mg total) by mouth 2 (two) times daily., Disp: 60 tablet, Rfl: 0 .  propranolol (INDERAL) 10 MG tablet, Take 10 mg by mouth 2 (two) times daily., Disp: , Rfl:  .  INVOKAMET XR 50-1000 MG TB24, TAKE 1 TABLET BY MOUTH  DAILY, Disp: 120 tablet, Rfl: 3 .  losartan (COZAAR) 25 MG tablet, Take 1 tablet (25 mg total) by mouth daily. (Patient not taking: Reported on 12/21/2017), Disp: 90 tablet, Rfl: 3 .  mupirocin ointment (BACTROBAN) 2 %, Place 1 application  into the nose 2 (two) times daily. (Patient not taking: Reported on 04/27/2017), Disp: 22 g, Rfl: 1 .  promethazine (PHENERGAN) 25 MG suppository, Place 1 suppository (25 mg total) rectally every 6 (six) hours as needed for nausea or vomiting. (Patient not taking: Reported on 04/27/2017), Disp: 12 each, Rfl: 12 .  sildenafil (REVATIO) 20 MG tablet, 2 to 4 tablets daily as needed (Patient not taking: Reported on 04/27/2017), Disp: 100 tablet, Rfl: 5 .  tamsulosin (FLOMAX) 0.4 MG CAPS capsule, Take 1 capsule (0.4 mg total) by mouth daily. (Patient not taking: Reported on 04/27/2017), Disp: 90 capsule, Rfl: 3 .  Vitamin D, Ergocalciferol, (DRISDOL) 50000 units CAPS capsule, Take 1 capsule (50,000 Units total) by mouth every 7 (seven) days. (Patient not taking: Reported on 12/21/2017), Disp: 12 capsule, Rfl: 3  Review of Systems  Constitutional: Positive for appetite change and fatigue. Negative for activity change, chills, diaphoresis, fever and unexpected weight change.  HENT: Positive for ear pain, nosebleeds, postnasal drip, rhinorrhea, sinus pressure, sinus pain, sneezing and sore throat. Negative for congestion and tinnitus.   Eyes: Negative.   Respiratory: Positive for cough. Negative for apnea, choking, chest tightness, shortness of breath, wheezing and stridor.   Cardiovascular: Negative.   Gastrointestinal: Positive for nausea and rectal pain. Negative for abdominal distention, abdominal pain, anal bleeding, blood in stool, constipation, diarrhea and vomiting.  Endocrine: Negative.   Genitourinary: Negative.   Allergic/Immunologic: Negative.   Psychiatric/Behavioral: Negative.     Social History   Tobacco Use  . Smoking status: Never Smoker  . Smokeless tobacco: Never Used  Substance Use Topics  . Alcohol use: No   Objective:   BP 122/70   Pulse 72   Temp 98 F (36.7 C) (Oral)   Resp 16   Wt (!) 314 lb 3.2 oz (142.5 kg)   SpO2 98%   BMI 42.61 kg/m  Vitals:   12/21/17 1412  BP:  122/70  Pulse: 72  Resp: 16  Temp: 98 F (36.7 C)  TempSrc: Oral  SpO2: 98%  Weight: (!) 314 lb 3.2 oz (142.5 kg)     Physical Exam  Constitutional: He is oriented to person, place, and time. He appears well-developed and well-nourished.  Morbidly obese WM NAD.  HENT:  Head: Normocephalic and atraumatic.  Right Ear: External ear normal.  Left Ear: External ear normal.  Nose: Nose normal.  Mouth/Throat: Oropharynx is clear and moist.  Eyes: Conjunctivae are normal. No scleral icterus.  Neck: No thyromegaly present.  Cardiovascular: Normal rate, regular rhythm, normal heart sounds and intact distal pulses.  Pulmonary/Chest: Effort normal and breath sounds normal.  Abdominal: Soft.  Musculoskeletal:  Trace edema.  Lymphadenopathy:    He has no cervical adenopathy.  Neurological: He is alert and oriented to person, place, and time.  Skin: Skin is warm and dry.  Psychiatric: He has a normal mood and affect. His behavior is normal. Judgment and thought content normal.        Assessment & Plan:     1. Essential hypertension  - CBC with Differential/Platelet - Comprehensive metabolic panel - TSH  2. Other cirrhosis of liver ([place])   3. Diabetes mellitus type 2 in obese (HCC)  - Hemoglobin A1c  4. Hyperlipidemia, unspecified hyperlipidemia type  - Lipid Panel With LDL/HDL Ratio  5. Impotence of organic origin  - Testosterone  6. Renal stone  - ondansetron (ZOFRAN) 4 MG tablet; Take 1 tablet (4 mg total) by mouth every 8 (eight) hours as needed for nausea or vomiting.  Dispense: 45 tablet; Refill: 1 - HYDROcodone-acetaminophen (NORCO) 10-325 MG tablet; Take 1 tablet by mouth every 4 (four) hours as needed.  Dispense: 45 tablet; Refill: 0  7. Hemorrhoids, unspecified hemorrhoid type  - hydrocortisone (ANUSOL-HC) 25 MG suppository; Place 1 suppository (25 mg total) rectally at bedtime as needed for hemorrhoids or anal itching.  Dispense: 12 suppository; Refill:  5  8. Cough  - DG Chest 2 View; Future  9. Motion sickness, initial encounter  - scopolamine (TRANSDERM-SCOP) 1 MG/3DAYS; Place 1 patch (1.5 mg total) onto the skin every 3 (three) days.  Dispense: 10 patch; Refill: 12  10. Vitamin D deficiency  - VITAMIN D 25 Hydroxy (Vit-D Deficiency, Fractures)  11. Vitamin B12 deficiency  - B12 and Folate Panel      I have done the exam and reviewed the chart and it is accurate to the best of my knowledge. Development worker, community has been used and  any errors in dictation or transcription are unintentional. Miguel Aschoff M.D. [place], MD  [facility]

## 2017-12-23 DIAGNOSIS — E559 Vitamin D deficiency, unspecified: Secondary | ICD-10-CM | POA: Diagnosis not present

## 2017-12-23 DIAGNOSIS — E1169 Type 2 diabetes mellitus with other specified complication: Secondary | ICD-10-CM | POA: Diagnosis not present

## 2017-12-23 DIAGNOSIS — E538 Deficiency of other specified B group vitamins: Secondary | ICD-10-CM | POA: Diagnosis not present

## 2017-12-23 DIAGNOSIS — E785 Hyperlipidemia, unspecified: Secondary | ICD-10-CM | POA: Diagnosis not present

## 2017-12-23 DIAGNOSIS — E669 Obesity, unspecified: Secondary | ICD-10-CM | POA: Diagnosis not present

## 2017-12-23 DIAGNOSIS — I10 Essential (primary) hypertension: Secondary | ICD-10-CM | POA: Diagnosis not present

## 2017-12-24 LAB — COMPREHENSIVE METABOLIC PANEL
ALT: 20 IU/L (ref 0–44)
AST: 42 IU/L — ABNORMAL HIGH (ref 0–40)
Albumin/Globulin Ratio: 1.1 — ABNORMAL LOW (ref 1.2–2.2)
Albumin: 3.4 g/dL — ABNORMAL LOW (ref 3.5–5.5)
Alkaline Phosphatase: 169 IU/L — ABNORMAL HIGH (ref 39–117)
BUN/Creatinine Ratio: 12 (ref 9–20)
BUN: 13 mg/dL (ref 6–24)
Bilirubin Total: 1.4 mg/dL — ABNORMAL HIGH (ref 0.0–1.2)
CO2: 21 mmol/L (ref 20–29)
Calcium: 8.3 mg/dL — ABNORMAL LOW (ref 8.7–10.2)
Chloride: 106 mmol/L (ref 96–106)
Creatinine, Ser: 1.05 mg/dL (ref 0.76–1.27)
GFR calc Af Amer: 91 mL/min/{1.73_m2} (ref >59)
GFR calc non Af Amer: 79 mL/min/{1.73_m2} (ref >59)
Globulin, Total: 3 g/dL (ref 1.5–4.5)
Glucose: 206 mg/dL — ABNORMAL HIGH (ref 65–99)
Potassium: 4.3 mmol/L (ref 3.5–5.2)
Sodium: 139 mmol/L (ref 134–144)
Total Protein: 6.4 g/dL (ref 6.0–8.5)

## 2017-12-24 LAB — CBC WITH DIFFERENTIAL/PLATELET
Basophils Absolute: 0 10*3/uL (ref 0.0–0.2)
Basos: 1 %
EOS (ABSOLUTE): 0.3 10*3/uL (ref 0.0–0.4)
Eos: 6 %
Hematocrit: 35.4 % — ABNORMAL LOW (ref 37.5–51.0)
Hemoglobin: 11.8 g/dL — ABNORMAL LOW (ref 13.0–17.7)
Immature Grans (Abs): 0 10*3/uL (ref 0.0–0.1)
Immature Granulocytes: 0 %
Lymphocytes Absolute: 1 10*3/uL (ref 0.7–3.1)
Lymphs: 22 %
MCH: 28.9 pg (ref 26.6–33.0)
MCHC: 33.3 g/dL (ref 31.5–35.7)
MCV: 87 fL (ref 79–97)
Monocytes Absolute: 0.3 10*3/uL (ref 0.1–0.9)
Monocytes: 7 %
Neutrophils Absolute: 2.9 10*3/uL (ref 1.4–7.0)
Neutrophils: 64 %
Platelets: 51 10*3/uL — CL (ref 150–450)
RBC: 4.09 x10E6/uL — ABNORMAL LOW (ref 4.14–5.80)
RDW: 15.9 % — ABNORMAL HIGH (ref 12.3–15.4)
WBC: 4.6 10*3/uL (ref 3.4–10.8)

## 2017-12-24 LAB — B12 AND FOLATE PANEL
Folate: 4.7 ng/mL (ref >3.0)
Vitamin B-12: 496 pg/mL (ref 232–1245)

## 2017-12-24 LAB — HEMOGLOBIN A1C
Est. average glucose Bld gHb Est-mCnc: 232 mg/dL
Hgb A1c MFr Bld: 9.7 % — ABNORMAL HIGH (ref 4.8–5.6)

## 2017-12-24 LAB — LIPID PANEL WITH LDL/HDL RATIO
Cholesterol, Total: 124 mg/dL (ref 100–199)
HDL: 45 mg/dL (ref >39)
LDL Calculated: 53 mg/dL (ref 0–99)
LDl/HDL Ratio: 1.2 ratio (ref 0.0–3.6)
Triglycerides: 129 mg/dL (ref 0–149)
VLDL Cholesterol Cal: 26 mg/dL (ref 5–40)

## 2017-12-24 LAB — VITAMIN D 25 HYDROXY (VIT D DEFICIENCY, FRACTURES): Vit D, 25-Hydroxy: 16.7 ng/mL — ABNORMAL LOW (ref 30.0–100.0)

## 2017-12-24 LAB — TSH: TSH: 2.58 u[IU]/mL (ref 0.450–4.500)

## 2017-12-24 LAB — TESTOSTERONE: Testosterone: 429 ng/dL (ref 264–916)

## 2017-12-26 ENCOUNTER — Encounter: Payer: Self-pay | Admitting: *Deleted

## 2017-12-26 ENCOUNTER — Emergency Department
Admission: EM | Admit: 2017-12-26 | Discharge: 2017-12-27 | Disposition: A | Discharge status: Home / Self Care | Payer: BLUE CROSS/BLUE SHIELD | Attending: Student in an Organized Health Care Education/Training Program | Admitting: Student in an Organized Health Care Education/Training Program

## 2017-12-26 ENCOUNTER — Emergency Department: Payer: BLUE CROSS/BLUE SHIELD

## 2017-12-26 ENCOUNTER — Other Ambulatory Visit: Payer: Self-pay

## 2017-12-26 DIAGNOSIS — Z79899 Other long term (current) drug therapy: Secondary | ICD-10-CM | POA: Insufficient documentation

## 2017-12-26 DIAGNOSIS — Y929 Unspecified place or not applicable: Secondary | ICD-10-CM | POA: Insufficient documentation

## 2017-12-26 DIAGNOSIS — Z7984 Long term (current) use of oral hypoglycemic drugs: Secondary | ICD-10-CM | POA: Diagnosis not present

## 2017-12-26 DIAGNOSIS — M79621 Pain in right upper arm: Secondary | ICD-10-CM | POA: Diagnosis not present

## 2017-12-26 DIAGNOSIS — M25511 Pain in right shoulder: Secondary | ICD-10-CM | POA: Diagnosis not present

## 2017-12-26 DIAGNOSIS — Y9389 Activity, other specified: Secondary | ICD-10-CM | POA: Diagnosis not present

## 2017-12-26 DIAGNOSIS — S42201A Unspecified fracture of upper end of right humerus, initial encounter for closed fracture: Secondary | ICD-10-CM | POA: Diagnosis not present

## 2017-12-26 DIAGNOSIS — W07XXXA Fall from chair, initial encounter: Secondary | ICD-10-CM | POA: Insufficient documentation

## 2017-12-26 DIAGNOSIS — E119 Type 2 diabetes mellitus without complications: Secondary | ICD-10-CM | POA: Diagnosis not present

## 2017-12-26 DIAGNOSIS — S42291A Other displaced fracture of upper end of right humerus, initial encounter for closed fracture: Secondary | ICD-10-CM | POA: Diagnosis not present

## 2017-12-26 DIAGNOSIS — Y999 Unspecified external cause status: Secondary | ICD-10-CM | POA: Insufficient documentation

## 2017-12-26 DIAGNOSIS — S0990XA Unspecified injury of head, initial encounter: Secondary | ICD-10-CM | POA: Diagnosis not present

## 2017-12-26 DIAGNOSIS — S4991XA Unspecified injury of right shoulder and upper arm, initial encounter: Secondary | ICD-10-CM | POA: Diagnosis not present
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
## 2017-12-27 ENCOUNTER — Emergency Department: Payer: BLUE CROSS/BLUE SHIELD

## 2017-12-27 DIAGNOSIS — S0990XA Unspecified injury of head, initial encounter: Secondary | ICD-10-CM | POA: Diagnosis not present

## 2017-12-27 MED ORDER — CYCLOBENZAPRINE HCL 5 MG PO TABS: 5.0000 mg | ORAL_TABLET | Freq: Three times a day (TID) | ORAL | 0 refills | Status: DC | PRN

## 2017-12-27 MED ORDER — OXYCODONE HCL 5 MG PO TABS: 5.0000 mg | ORAL_TABLET | Freq: Three times a day (TID) | ORAL | 0 refills | Status: DC | PRN

## 2017-12-27 MED ORDER — HYDROCODONE-ACETAMINOPHEN 5-325 MG PO TABS
1.0000 | ORAL_TABLET | Freq: Once | ORAL | Status: AC
  Administered 2017-12-27: 1 via ORAL
  Filled 2017-12-27: qty 1

## 2017-12-27 NOTE — ED Notes (Signed)
Patient transported to CT 

## 2017-12-27 NOTE — ED Provider Notes (Addendum)
[facility] Emergency Department Provider Note    First MD Initiated Contact with Patient 12/26/17 2348     (approximate)  I have reviewed the triage vital signs and the nursing notes.   HISTORY  Chief Complaint Shoulder Injury and Laceration    HPI Jesse Zimmerman is a 57 y.o. male history of [name] cirrhosis presents the ER after mechanical fall where he slipped getting out of a chair today fell onto his right shoulder hitting his head on the chair.  No LOC.  Denies any neck pain.  Primary complaint is mild to moderate right shoulder pain.  No numbness or tingling but does feel swelling in the right shoulder and has been unable to use it due to pain.  Denies any shortness of breath or chest pain.  No abdominal pain.    Past Medical History:  Diagnosis Date  . Chronic back pain   . 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 Diabetes mellitus without complication ([place])   . Esophageal varices ([place])   . Hyperlipidemia   . NASH (nonalcoholic steatohepatitis)    Family History  Problem Relation Age of Onset  . Hypertension Mother   . Breast cancer Mother   . Dementia Mother   . Heart attack Father   . Gout Father   . Hypertension Father   . Hypertension Sister   . Hypertension Sister   . Colon cancer Maternal Aunt   . Colon cancer Maternal Grandmother   . Colon cancer Paternal Grandmother    Past Surgical History:  Procedure Laterality Date  . COLONOSCOPY WITH PROPOFOL N/A 08/12/2017   Procedure: COLONOSCOPY WITH PROPOFOL;  Surgeon: Jonathon Bellows, MD;  Location: [facility] ENDOSCOPY;  Service: Gastroenterology;  Laterality: N/A;  . ESOPHAGOGASTRODUODENOSCOPY (EGD) WITH PROPOFOL N/A 04/18/2017   Procedure: ESOPHAGOGASTRODUODENOSCOPY (EGD) WITH PROPOFOL;  Surgeon: [name], MD;  Location: [facility] ENDOSCOPY;  Service: Endoscopy;  Laterality: N/A;  . EXCESSIVE THIGH / HIP / BUTTOCK / FLANK SKIN EXCISION     PART OF INNER THIGH/DUE TO SEPSIS INFECTION REMOVED  . KIDNEY  STONE SURGERY     removed  . TONSILLECTOMY AND ADENOIDECTOMY     Patient Active Problem List   Diagnosis Date Noted  . Blood in stool   . Secondary esophageal varices without bleeding ([place])   . Portal hypertension ([place])   . Upper GI bleed 04/17/2017  . Contusion, back 12/26/2014  . Bruising 12/26/2014  . Back pain, chronic 11/22/2014  . Diabetes mellitus type 2 in obese ([place]) 11/22/2014  . 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 Acid reflux 11/22/2014  . HLD (hyperlipidemia) 11/22/2014  . BP (high blood pressure) 11/22/2014  . 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 Cannot sleep 11/22/2014  . Cirrhosis ([place]) 11/22/2014  . Adiposity 11/22/2014  . Change in blood platelet count 11/22/2014  . Avitaminosis D 11/22/2014  . Encephalopathy, hepatic ([place]) 05/23/2012  . 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 Authorizing Provider  CANAGLIFLOZIN PO Take 1,000 mg by mouth daily at 2 PM.    [provider]  furosemide (LASIX) 20 MG tablet 1-2 pills daily 11/10/16   Jerrol Banana., MD  HYDROcodone-acetaminophen [facility]) 10-325 MG tablet Take 1 tablet by mouth every 4 (four) hours as needed. 12/21/17   Jerrol Banana., MD  hydrocortisone (ANUSOL-HC) 25 MG suppository Place 1 suppository (25 mg total)  rectally at bedtime as needed for hemorrhoids or anal itching. 12/21/17   Jerrol Banana., MD  INVOKAMET XR 50-1000 MG TB24 TAKE 1 TABLET BY MOUTH  DAILY 05/31/17   Jerrol Banana., MD  losartan (COZAAR) 25 MG tablet Take 1 tablet (25 mg total) by mouth daily. Patient not taking: Reported on 12/21/2017 02/03/17   Jerrol Banana., MD  mupirocin ointment (BACTROBAN) 2 % Place 1 application into the nose 2 (two) times daily. Patient not taking: Reported on 04/27/2017 03/30/17   Jerrol Banana., MD  ondansetron (ZOFRAN) 4 MG tablet Take 1 tablet (4 mg  total) by mouth every 8 (eight) hours as needed for nausea or vomiting. 12/21/17   Jerrol Banana., MD  pantoprazole (PROTONIX) 40 MG tablet Take 1 tablet (40 mg total) by mouth 2 (two) times daily. 04/19/17   Vaughan Basta, MD  promethazine (PHENERGAN) 25 MG suppository Place 1 suppository (25 mg total) rectally every 6 (six) hours as needed for nausea or vomiting. Patient not taking: Reported on 04/27/2017 04/02/15   Jerrol Banana., MD  propranolol (INDERAL) 10 MG tablet Take 10 mg by mouth 2 (two) times daily.    [provider]  scopolamine (TRANSDERM-SCOP) 1 MG/3DAYS Place 1 patch (1.5 mg total) onto the skin every 3 (three) days. 12/21/17   Jerrol Banana., MD  sildenafil (REVATIO) 20 MG tablet 2 to 4 tablets daily as needed Patient not taking: Reported on 04/27/2017 02/03/17   Jerrol Banana., MD  tamsulosin [facility]) 0.4 MG CAPS capsule Take 1 capsule (0.4 mg total) by mouth daily. Patient not taking: Reported on 04/27/2017 02/03/17   Jerrol Banana., MD  Vitamin D, Ergocalciferol, (DRISDOL) 50000 units CAPS capsule Take 1 capsule (50,000 Units total) by mouth every 7 (seven) days. Patient not taking: Reported on 12/21/2017 07/14/17   Jerrol Banana., MD    Allergies Latex and Pineapple    Social History Social History   Tobacco Use  . Smoking status: Never Smoker  . Smokeless tobacco: Never Used  Substance Use Topics  . Alcohol use: No  . Drug use: No    Review of Systems Patient denies headaches, rhinorrhea, blurry vision, numbness, shortness of breath, chest pain, edema, cough, abdominal pain, nausea, vomiting, diarrhea, dysuria, fevers, rashes or hallucinations unless otherwise stated above in HPI. ____________________________________________   PHYSICAL EXAM:  VITAL SIGNS: Vitals:   12/26/17 2252  BP: (!) 156/80  Pulse: 78  Resp: 18  Temp: 98.4 F (36.9 C)  SpO2: 100%    Constitutional: Alert and oriented.    Eyes: Conjunctivae are normal.  Head: Small superficial abrasion to the right eyebrow hemostatic. Nose: No congestion/rhinnorhea. Mouth/Throat: Mucous membranes are moist.   Neck: No stridor. Painless ROM.  Cardiovascular: Normal rate, regular rhythm. Grossly normal heart sounds.  Good peripheral circulation. Respiratory: Normal respiratory effort.  No retractions. Lungs CTAB. Gastrointestinal: Soft and nontender. No distention. No abdominal bruits. No CVA tenderness. Genitourinary:  Musculoskeletal: Tenderness palpation swelling of the right proximal humerus.  Unable to move right upper extremity secondary to pain.  No tenderness palpation of the elbow or distal forearm.  Sensation intact to light touch.    2+ radial pulse. no weakness with wrist extension.  Unable to left elbow 2/2 pain. No lower extremity tenderness nor edema.  No joint effusions. Neurologic:  Normal speech and language. No gross focal neurologic deficits are appreciated. No facial droop Skin:  Skin is warm, dry and intact. No rash noted. Psychiatric: Mood and affect are normal. Speech and behavior are normal.  ____________________________________________   LABS (all labs ordered are listed, but only abnormal results are displayed)  No results found for this or any previous visit (from the past 24 hour(s)). ____________________________________________  EKG_____________________________  RADIOLOGY  I personally reviewed all radiographic images ordered to evaluate for the above acute complaints and reviewed radiology reports and findings.  These findings were personally discussed with the patient.  Please see medical record for radiology report.  ____________________________________________   PROCEDURES  Procedure(s) performed:  Procedures    Critical Care performed: no ____________________________________________   INITIAL IMPRESSION / ASSESSMENT AND PLAN / ED COURSE  Pertinent labs & imaging results  that were available during my care of the patient were reviewed by me and considered in my medical decision making (see chart for details).   DDX: fracture, contusion, dislocation, sdh, iph  Jesse Zimmerman is a 57 y.o. who presents to the ED with symptoms as described above.  Patient with evidence of proximal right humerus fracture with anterior displacement.  Discussed case with Dr. Sabra Heck who agrees to follow patient in clinic.  Will place in sling.  CT imaging ordered of the head to rule out intracranial normalities the patient is at high risk given his underlying cirrhosis.  No evidence of other ACA traumatic injury.  No evidence of acute intracranial abnormality.  Patient stable and appropriate for outpatient follow-up.      As part of my medical decision making, I reviewed the following data within the [facility] notes reviewed and incorporated, Labs reviewed, notes from prior ED visits.   ____________________________________________   FINAL CLINICAL IMPRESSION(S) / ED DIAGNOSES  Final diagnoses:  Other closed displaced fracture of proximal end of right humerus, initial encounter  Minor head injury, initial encounter      NEW MEDICATIONS STARTED DURING THIS VISIT:  New Prescriptions   No medications on file     Note:  This document was prepared using Dragon voice recognition software and may include unintentional dictation errors.    Merlyn Lot, MD 12/27/17 Christophe Louis, MD 12/27/17 [place], [place], MD 12/27/17 [phone]

## 2017-12-28 ENCOUNTER — Ambulatory Visit: Payer: Self-pay | Admitting: Family Medicine

## 2017-12-28 ENCOUNTER — Telehealth: Payer: Self-pay | Admitting: Family Medicine

## 2017-12-28 DIAGNOSIS — S42254A Nondisplaced fracture of greater tuberosity of right humerus, initial encounter for closed fracture: Secondary | ICD-10-CM | POA: Diagnosis not present

## 2017-12-28 NOTE — Telephone Encounter (Signed)
Spoke with wife and offered they come in this morning and she was not sure she could get him in at 2:40 and she took that appointment.

## 2017-12-28 NOTE — Telephone Encounter (Signed)
Patient has a broken shoulder. Patient's wife,  Velva Harman,  called this morning stating his shoulder and forearm is red, purple, black and blue and is VERY hot to touch. He cannot see Dr. Ronnald Ramp, ortho till Friday.  She said she is worried because he is a diabetic and it looks really bad.  Thinks he cannot wait till Friday to be seen.   Wants to know if there is another ortho he can see for f/u today???

## 2017-12-28 NOTE — Progress Notes (Signed)
Advised  ED 

## 2018-01-11 DIAGNOSIS — S42254A Nondisplaced fracture of greater tuberosity of right humerus, initial encounter for closed fracture: Secondary | ICD-10-CM | POA: Diagnosis not present

## 2018-02-03 DIAGNOSIS — S42254A Nondisplaced fracture of greater tuberosity of right humerus, initial encounter for closed fracture: Secondary | ICD-10-CM | POA: Diagnosis not present

## 2018-02-08 DIAGNOSIS — M25511 Pain in right shoulder: Secondary | ICD-10-CM | POA: Diagnosis not present

## 2018-02-08 DIAGNOSIS — M25611 Stiffness of right shoulder, not elsewhere classified: Secondary | ICD-10-CM | POA: Diagnosis not present

## 2018-02-13 DIAGNOSIS — M25511 Pain in right shoulder: Secondary | ICD-10-CM | POA: Diagnosis not present

## 2018-02-13 DIAGNOSIS — M25611 Stiffness of right shoulder, not elsewhere classified: Secondary | ICD-10-CM | POA: Diagnosis not present

## 2018-02-14 ENCOUNTER — Encounter: Payer: Self-pay | Admitting: Family Medicine

## 2018-02-14 ENCOUNTER — Ambulatory Visit (INDEPENDENT_AMBULATORY_CARE_PROVIDER_SITE_OTHER): Payer: BLUE CROSS/BLUE SHIELD | Admitting: Family Medicine

## 2018-02-14 VITALS — BP 126/80 | HR 81 | Temp 97.8°F | Ht 72.0 in | Wt 302.0 lb

## 2018-02-14 DIAGNOSIS — M5442 Lumbago with sciatica, left side: Secondary | ICD-10-CM | POA: Diagnosis not present

## 2018-02-14 DIAGNOSIS — K7469 Other cirrhosis of liver: Secondary | ICD-10-CM | POA: Diagnosis not present

## 2018-02-14 DIAGNOSIS — G8929 Other chronic pain: Secondary | ICD-10-CM

## 2018-02-14 DIAGNOSIS — Z6841 Body Mass Index (BMI) 40.0 and over, adult: Secondary | ICD-10-CM | POA: Diagnosis not present

## 2018-02-14 NOTE — Progress Notes (Signed)
Patient: Jesse Zimmerman Male    DOB: [birth year]   57 y.o.   MRN: [id number] Visit Date: 02/14/2018  Today's Provider: Wilhemena Durie, MD   Chief Complaint  Patient presents with  . Obesity  . Back Pain    lower left and hip pain.   Subjective:    Back Pain  This is a chronic problem. The problem has been gradually worsening since onset. The pain is present in the lumbar spine (Lower left side.). The quality of the pain is described as stabbing and shooting. The pain radiates to the left thigh and left knee. The symptoms are aggravated by sitting. Associated symptoms include leg pain. Pertinent negatives include no bladder incontinence, bowel incontinence, headaches, numbness, paresis, paresthesias, perianal numbness, tingling or weakness.    Pt is here today to fill out his biometric form.       Allergies  Allergen Reactions  . Latex     LATEX TAPE  . Pineapple Hives     Current Outpatient Medications:  .  CANAGLIFLOZIN PO, Take 1,000 mg by mouth daily at 2 PM., Disp: , Rfl:  .  cyclobenzaprine (FLEXERIL) 5 MG tablet, Take 1 tablet (5 mg total) by mouth 3 (three) times daily as needed for muscle spasms., Disp: 12 tablet, Rfl: 0 .  furosemide (LASIX) 20 MG tablet, 1-2 pills daily, Disp: 180 tablet, Rfl: 1 .  HYDROcodone-acetaminophen (NORCO) 10-325 MG tablet, Take 1 tablet by mouth every 4 (four) hours as needed., Disp: 45 tablet, Rfl: 0 .  hydrocortisone (ANUSOL-HC) 25 MG suppository, Place 1 suppository (25 mg total) rectally at bedtime as needed for hemorrhoids or anal itching., Disp: 12 suppository, Rfl: 5 .  INVOKAMET XR 50-1000 MG TB24, TAKE 1 TABLET BY MOUTH  DAILY, Disp: 120 tablet, Rfl: 3 .  losartan (COZAAR) 25 MG tablet, Take 1 tablet (25 mg total) by mouth daily., Disp: 90 tablet, Rfl: 3 .  mupirocin ointment (BACTROBAN) 2 %, Place 1 application into the nose 2 (two) times daily., Disp: 22 g, Rfl: 1 .  ondansetron (ZOFRAN) 4 MG tablet, Take 1 tablet (4  mg total) by mouth every 8 (eight) hours as needed for nausea or vomiting., Disp: 45 tablet, Rfl: 1 .  oxyCODONE (ROXICODONE) 5 MG immediate release tablet, Take 1 tablet (5 mg total) by mouth every 8 (eight) hours as needed., Disp: 10 tablet, Rfl: 0 .  pantoprazole (PROTONIX) 40 MG tablet, Take 1 tablet (40 mg total) by mouth 2 (two) times daily., Disp: 60 tablet, Rfl: 0 .  propranolol (INDERAL) 10 MG tablet, Take 10 mg by mouth 2 (two) times daily., Disp: , Rfl:  .  scopolamine (TRANSDERM-SCOP) 1 MG/3DAYS, Place 1 patch (1.5 mg total) onto the skin every 3 (three) days., Disp: 10 patch, Rfl: 12 .  sildenafil (REVATIO) 20 MG tablet, 2 to 4 tablets daily as needed, Disp: 100 tablet, Rfl: 5 .  Vitamin D, Ergocalciferol, (DRISDOL) 50000 units CAPS capsule, Take 1 capsule (50,000 Units total) by mouth every 7 (seven) days., Disp: 12 capsule, Rfl: 3 .  promethazine (PHENERGAN) 25 MG suppository, Place 1 suppository (25 mg total) rectally every 6 (six) hours as needed for nausea or vomiting. (Patient not taking: Reported on 04/27/2017), Disp: 12 each, Rfl: 12 .  tamsulosin (FLOMAX) 0.4 MG CAPS capsule, Take 1 capsule (0.4 mg total) by mouth daily. (Patient not taking: Reported on 04/27/2017), Disp: 90 capsule, Rfl: 3  Review of Systems  Constitutional: Negative.  HENT: Positive for congestion, postnasal drip, sinus pressure and sinus pain.   Respiratory: Negative.   Cardiovascular: Negative.   Gastrointestinal: Negative for bowel incontinence.  Endocrine: Positive for cold intolerance.  Genitourinary: Negative for bladder incontinence.  Musculoskeletal: Positive for arthralgias, back pain and myalgias. Negative for gait problem, joint swelling, neck pain and neck stiffness.  Allergic/Immunologic: Positive for environmental allergies.  Neurological: Negative for dizziness, tingling, weakness, light-headedness, numbness, headaches and paresthesias.  Psychiatric/Behavioral: Negative.     Social  History   Tobacco Use  . Smoking status: Never Smoker  . Smokeless tobacco: Never Used  Substance Use Topics  . Alcohol use: No   Objective:   BP 126/80 (BP Location: Right Arm, Patient Position: Sitting, Cuff Size: Normal)   Pulse 81   Temp 97.8 F (36.6 C) (Oral)   Ht 6' (1.829 m)   Wt (!) 302 lb (137 kg)   SpO2 97%   BMI 40.96 kg/m  Vitals:   02/14/18 1547  BP: 126/80  Pulse: 81  Temp: 97.8 F (36.6 C)  TempSrc: Oral  SpO2: 97%  Weight: (!) 302 lb (137 kg)  Height: 6' (1.829 m)     Physical Exam  Constitutional: He is oriented to person, place, and time. He appears well-developed and well-nourished.  HENT:  Head: Normocephalic and atraumatic.  Right Ear: External ear normal.  Left Ear: External ear normal.  Nose: Nose normal.  Eyes: Conjunctivae are normal. No scleral icterus.  Neck: No thyromegaly present.  Cardiovascular: Normal rate, regular rhythm and normal heart sounds.  Pulmonary/Chest: Effort normal and breath sounds normal.  Abdominal: Soft.  Musculoskeletal: He exhibits no edema.  Neurological: He is alert and oriented to person, place, and time.  Skin: Skin is warm and dry.  Psychiatric: He has a normal mood and affect. His behavior is normal. Judgment and thought content normal.        Assessment & Plan:      Low Back Pain/Radiculopathy Cannot use NSAIDs.Consider PT referral and topical capsaicin. NASH Obesity Pt continuing to lose weight.     I have done the exam and reviewed the chart and it is accurate to the best of my knowledge. Development worker, community has been used and  any errors in dictation or transcription are unintentional. Miguel Aschoff M.D. [place], MD  [facility]

## 2018-02-17 DIAGNOSIS — M25511 Pain in right shoulder: Secondary | ICD-10-CM | POA: Diagnosis not present

## 2018-02-17 DIAGNOSIS — M25611 Stiffness of right shoulder, not elsewhere classified: Secondary | ICD-10-CM | POA: Diagnosis not present

## 2018-03-03 DIAGNOSIS — M25611 Stiffness of right shoulder, not elsewhere classified: Secondary | ICD-10-CM | POA: Diagnosis not present

## 2018-03-03 DIAGNOSIS — M25511 Pain in right shoulder: Secondary | ICD-10-CM | POA: Diagnosis not present

## 2018-03-10 DIAGNOSIS — M25611 Stiffness of right shoulder, not elsewhere classified: Secondary | ICD-10-CM | POA: Diagnosis not present

## 2018-03-10 DIAGNOSIS — M25511 Pain in right shoulder: Secondary | ICD-10-CM | POA: Diagnosis not present

## 2018-05-01 ENCOUNTER — Other Ambulatory Visit: Payer: Self-pay | Admitting: Family Medicine

## 2018-05-11 ENCOUNTER — Encounter: Payer: Self-pay | Admitting: Family Medicine

## 2018-05-11 ENCOUNTER — Ambulatory Visit (INDEPENDENT_AMBULATORY_CARE_PROVIDER_SITE_OTHER): Payer: BLUE CROSS/BLUE SHIELD | Admitting: Family Medicine

## 2018-05-11 VITALS — BP 138/72 | HR 74 | Temp 98.2°F | Resp 16 | Ht 72.0 in | Wt 329.0 lb

## 2018-05-11 DIAGNOSIS — E1169 Type 2 diabetes mellitus with other specified complication: Secondary | ICD-10-CM | POA: Diagnosis not present

## 2018-05-11 DIAGNOSIS — K7469 Other cirrhosis of liver: Secondary | ICD-10-CM

## 2018-05-11 DIAGNOSIS — R609 Edema, unspecified: Secondary | ICD-10-CM

## 2018-05-11 DIAGNOSIS — I10 Essential (primary) hypertension: Secondary | ICD-10-CM

## 2018-05-11 DIAGNOSIS — N200 Calculus of kidney: Secondary | ICD-10-CM

## 2018-05-11 DIAGNOSIS — R319 Hematuria, unspecified: Secondary | ICD-10-CM

## 2018-05-11 DIAGNOSIS — E785 Hyperlipidemia, unspecified: Secondary | ICD-10-CM

## 2018-05-11 DIAGNOSIS — E669 Obesity, unspecified: Secondary | ICD-10-CM

## 2018-05-11 DIAGNOSIS — N390 Urinary tract infection, site not specified: Secondary | ICD-10-CM

## 2018-05-11 MED ORDER — TAMSULOSIN HCL 0.4 MG PO CAPS: 0.4000 mg | ORAL_CAPSULE | Freq: Every day | ORAL | 3 refills | Status: DC

## 2018-05-11 MED ORDER — METOLAZONE 2.5 MG PO TABS: 2.5000 mg | ORAL_TABLET | Freq: Every day | ORAL | 5 refills | Status: DC

## 2018-05-11 MED ORDER — SULFAMETHOXAZOLE-TRIMETHOPRIM 800-160 MG PO TABS: 1.0000 | ORAL_TABLET | Freq: Two times a day (BID) | ORAL | 0 refills | Status: AC

## 2018-05-11 NOTE — Progress Notes (Signed)
Patient: Jesse Zimmerman Male    DOB: 1960-07-06   57 y.o.   MRN: [id number] Visit Date: 05/11/2018  Today's Provider: Wilhemena Durie, MD   Chief Complaint  Patient presents with  . Hypertension  . Diabetes  . Urinary Tract Infection   Subjective:    HPI  Hypertension, follow-up:  BP Readings from Last 3 Encounters:  05/11/18 138/72  02/14/18 126/80  12/27/17 (!) 147/80    He was last seen for hypertension 6 months ago.  BP at that visit was 126/80. Management since that visit includes no changes. He reports good compliance with treatment. He is not having side effects.  He is not exercising. He is adherent to low salt diet.   Outside blood pressures are checked occasionally. He is experiencing lower extremity edema.  Patient denies exertional chest pressure/discomfort and palpitations.   Cardiovascular risk factors include obesity (BMI >= 30 kg/m2).   Weight trend: increasing steadily Wt Readings from Last 3 Encounters:  05/11/18 (!) 329 lb (149.2 kg)  02/14/18 (!) 302 lb (137 kg)  12/26/17 (!) 308 lb (139.7 kg)    Current diet: well balanced  Kidney stone  Patient reports that he has had a kidney stone for about 2-3 weeks. He has felt pain in his right flank that has been constant. He has also noticed blood in his urine. He has not used anything OTC for symptoms. He reports that he has also been retaining fluid. He has doubled his furosemide (26m) for the last week.     Allergies  Allergen Reactions  . Latex     LATEX TAPE  . Pineapple Hives     Current Outpatient Medications:  .  CANAGLIFLOZIN PO, Take 1,000 mg by mouth daily at 2 PM., Disp: , Rfl:  .  cyclobenzaprine (FLEXERIL) 5 MG tablet, Take 1 tablet (5 mg total) by mouth 3 (three) times daily as needed for muscle spasms., Disp: 12 tablet, Rfl: 0 .  furosemide (LASIX) 20 MG tablet, 1-2 pills daily, Disp: 180 tablet, Rfl: 1 .  HYDROcodone-acetaminophen (NORCO) 10-325 MG tablet, Take 1  tablet by mouth every 4 (four) hours as needed., Disp: 45 tablet, Rfl: 0 .  INVOKAMET XR 50-1000 MG TB24, TAKE 1 TABLET BY MOUTH  DAILY, Disp: 120 tablet, Rfl: 3 .  losartan (COZAAR) 25 MG tablet, Take 1 tablet (25 mg total) by mouth daily., Disp: 90 tablet, Rfl: 3 .  mupirocin ointment (BACTROBAN) 2 %, Place 1 application into the nose 2 (two) times daily., Disp: 22 g, Rfl: 1 .  ondansetron (ZOFRAN) 4 MG tablet, Take 1 tablet (4 mg total) by mouth every 8 (eight) hours as needed for nausea or vomiting., Disp: 45 tablet, Rfl: 1 .  pantoprazole (PROTONIX) 40 MG tablet, Take 1 tablet (40 mg total) by mouth 2 (two) times daily., Disp: 60 tablet, Rfl: 0 .  propranolol (INDERAL) 10 MG tablet, Take 10 mg by mouth 2 (two) times daily., Disp: , Rfl:  .  scopolamine (TRANSDERM-SCOP) 1 MG/3DAYS, Place 1 patch (1.5 mg total) onto the skin every 3 (three) days., Disp: 10 patch, Rfl: 12 .  sildenafil (REVATIO) 20 MG tablet, 2 to 4 tablets daily as needed, Disp: 100 tablet, Rfl: 5 .  Vitamin D, Ergocalciferol, (DRISDOL) 50000 units CAPS capsule, Take 1 capsule (50,000 Units total) by mouth every 7 (seven) days., Disp: 12 capsule, Rfl: 3 .  hydrocortisone (ANUSOL-HC) 25 MG suppository, Place 1 suppository (25 mg total) rectally  at bedtime as needed for hemorrhoids or anal itching. (Patient not taking: Reported on 05/11/2018), Disp: 12 suppository, Rfl: 5 .  oxyCODONE (ROXICODONE) 5 MG immediate release tablet, Take 1 tablet (5 mg total) by mouth every 8 (eight) hours as needed. (Patient not taking: Reported on 05/11/2018), Disp: 10 tablet, Rfl: 0 .  promethazine (PHENERGAN) 25 MG suppository, Place 1 suppository (25 mg total) rectally every 6 (six) hours as needed for nausea or vomiting. (Patient not taking: Reported on 04/27/2017), Disp: 12 each, Rfl: 12 .  tamsulosin (FLOMAX) 0.4 MG CAPS capsule, Take 1 capsule (0.4 mg total) by mouth daily. (Patient not taking: Reported on 04/27/2017), Disp: 90 capsule, Rfl:  3  Review of Systems  Constitutional: Positive for appetite change. Negative for activity change, chills, diaphoresis, fatigue and fever.  HENT: Negative.   Eyes: Negative.   Respiratory: Negative.   Cardiovascular: Positive for leg swelling. Negative for chest pain and palpitations.  Gastrointestinal: Positive for nausea. Negative for abdominal pain, constipation and diarrhea.  Endocrine: Positive for polydipsia. Negative for cold intolerance, heat intolerance, polyphagia and polyuria.  Genitourinary: Positive for decreased urine volume, dysuria, flank pain, frequency, hematuria and urgency. Negative for discharge, penile pain, penile swelling, scrotal swelling and testicular pain.  Musculoskeletal: Positive for arthralgias and myalgias.  Allergic/Immunologic: Negative.   Neurological: Negative for dizziness and headaches.  Psychiatric/Behavioral: Negative.     Social History   Tobacco Use  . Smoking status: Never Smoker  . Smokeless tobacco: Never Used  Substance Use Topics  . Alcohol use: No   Objective:   BP 138/72 (BP Location: Left Arm, Patient Position: Sitting, Cuff Size: Large)   Pulse 74   Temp 98.2 F (36.8 C)   Resp 16   Ht 6' (1.829 m)   Wt (!) 329 lb (149.2 kg)   SpO2 100%   BMI 44.62 kg/m  Vitals:   05/11/18 1632  BP: 138/72  Pulse: 74  Resp: 16  Temp: 98.2 F (36.8 C)  SpO2: 100%  Weight: (!) 329 lb (149.2 kg)  Height: 6' (1.829 m)     Physical Exam  Constitutional: He is oriented to person, place, and time. He appears well-developed and well-nourished.  HENT:  Head: Normocephalic and atraumatic.  Right Ear: External ear normal.  Left Ear: External ear normal.  Nose: Nose normal.  Eyes: Conjunctivae are normal. No scleral icterus.  Neck: No thyromegaly present.  Cardiovascular: Normal rate, regular rhythm and normal heart sounds.  Pulmonary/Chest: Effort normal and breath sounds normal.  Abdominal: Soft. There is no tenderness.  No CVAT.   Musculoskeletal: He exhibits edema.  1-2+ edema.  Neurological: He is alert and oriented to person, place, and time.  Skin: Skin is warm and dry.  Psychiatric: He has a normal mood and affect. His behavior is normal. Judgment and thought content normal.        Assessment & Plan:     1. Recurrent kidney stones  - tamsulosin (FLOMAX) 0.4 MG CAPS capsule; Take 1 capsule (0.4 mg total) by mouth daily.  Dispense: 90 capsule; Refill: 3  2. Diabetes mellitus type 2 in obese (HCC)  - Hemoglobin A1c  3. Essential hypertension  - Comprehensive metabolic panel  4. Hyperlipidemia, unspecified hyperlipidemia type   5. Renal stone   6. Edema, unspecified type Add Zaroxolyn daily--RTC 2-4 weeks. - CBC with Differential/Platelet - B Nat Peptide - metolazone (ZAROXOLYN) 2.5 MG tablet; Take 1 tablet (2.5 mg total) by mouth daily.  Dispense: 30 tablet; Refill:  5  7. Urinary tract infection with hematuria, site unspecified  - sulfamethoxazole-trimethoprim (BACTRIM DS,SEPTRA DS) 800-160 MG tablet; Take 1 tablet by mouth 2 (two) times daily for 7 days.  Dispense: 14 tablet; Refill: 0 8.Cirrhosis due to NASH     I have done the exam and reviewed the above chart and it is accurate to the best of my knowledge. Development worker, community has been used in this note in any air is in the dictation or transcription are unintentional.  Wilhemena Durie, MD  [place]

## 2018-05-16 DIAGNOSIS — E1169 Type 2 diabetes mellitus with other specified complication: Secondary | ICD-10-CM | POA: Diagnosis not present

## 2018-05-16 DIAGNOSIS — E669 Obesity, unspecified: Secondary | ICD-10-CM | POA: Diagnosis not present

## 2018-05-16 DIAGNOSIS — I10 Essential (primary) hypertension: Secondary | ICD-10-CM | POA: Diagnosis not present

## 2018-05-16 DIAGNOSIS — K7581 Nonalcoholic steatohepatitis (NASH): Secondary | ICD-10-CM | POA: Diagnosis not present

## 2018-05-16 DIAGNOSIS — R609 Edema, unspecified: Secondary | ICD-10-CM | POA: Diagnosis not present

## 2018-05-16 DIAGNOSIS — K7460 Unspecified cirrhosis of liver: Secondary | ICD-10-CM | POA: Diagnosis not present

## 2018-05-17 LAB — COMPREHENSIVE METABOLIC PANEL
ALT: 28 IU/L (ref 0–44)
AST: 45 IU/L — ABNORMAL HIGH (ref 0–40)
Albumin/Globulin Ratio: 0.9 — ABNORMAL LOW (ref 1.2–2.2)
Albumin: 3 g/dL — ABNORMAL LOW (ref 3.5–5.5)
Alkaline Phosphatase: 182 IU/L — ABNORMAL HIGH (ref 39–117)
BUN/Creatinine Ratio: 12 (ref 9–20)
BUN: 16 mg/dL (ref 6–24)
Bilirubin Total: 1.7 mg/dL — ABNORMAL HIGH (ref 0.0–1.2)
CO2: 24 mmol/L (ref 20–29)
Calcium: 8.4 mg/dL — ABNORMAL LOW (ref 8.7–10.2)
Chloride: 97 mmol/L (ref 96–106)
Creatinine, Ser: 1.37 mg/dL — ABNORMAL HIGH (ref 0.76–1.27)
GFR calc Af Amer: 66 mL/min/{1.73_m2} (ref >59)
GFR calc non Af Amer: 57 mL/min/{1.73_m2} — ABNORMAL LOW (ref >59)
Globulin, Total: 3.3 g/dL (ref 1.5–4.5)
Glucose: 276 mg/dL — ABNORMAL HIGH (ref 65–99)
Potassium: 4.3 mmol/L (ref 3.5–5.2)
Sodium: 136 mmol/L (ref 134–144)
Total Protein: 6.3 g/dL (ref 6.0–8.5)

## 2018-05-17 LAB — CBC WITH DIFFERENTIAL/PLATELET
Basophils Absolute: 0 10*3/uL (ref 0.0–0.2)
Basos: 1 %
EOS (ABSOLUTE): 0.3 10*3/uL (ref 0.0–0.4)
Eos: 6 %
Hematocrit: 33 % — ABNORMAL LOW (ref 37.5–51.0)
Hemoglobin: 11.3 g/dL — ABNORMAL LOW (ref 13.0–17.7)
Immature Grans (Abs): 0 10*3/uL (ref 0.0–0.1)
Immature Granulocytes: 0 %
Lymphocytes Absolute: 1.1 10*3/uL (ref 0.7–3.1)
Lymphs: 23 %
MCH: 32.3 pg (ref 26.6–33.0)
MCHC: 34.2 g/dL (ref 31.5–35.7)
MCV: 94 fL (ref 79–97)
Monocytes Absolute: 0.4 10*3/uL (ref 0.1–0.9)
Monocytes: 9 %
Neutrophils Absolute: 2.7 10*3/uL (ref 1.4–7.0)
Neutrophils: 61 %
Platelets: 54 10*3/uL — CL (ref 150–450)
RBC: 3.5 x10E6/uL — ABNORMAL LOW (ref 4.14–5.80)
RDW: 14.9 % (ref 12.3–15.4)
WBC: 4.5 10*3/uL (ref 3.4–10.8)

## 2018-05-17 LAB — BRAIN NATRIURETIC PEPTIDE: BNP: 137.6 pg/mL — ABNORMAL HIGH (ref 0.0–100.0)

## 2018-05-17 LAB — HEMOGLOBIN A1C
Est. average glucose Bld gHb Est-mCnc: 186 mg/dL
Hgb A1c MFr Bld: 8.1 % — ABNORMAL HIGH (ref 4.8–5.6)

## 2018-05-23 ENCOUNTER — Telehealth: Payer: Self-pay | Admitting: *Deleted

## 2018-05-23 NOTE — Telephone Encounter (Signed)
LMOVM for pt to return call 

## 2018-05-23 NOTE — Telephone Encounter (Signed)
Patient was notified of results. Expressed understanding.

## 2018-05-23 NOTE — Telephone Encounter (Signed)
-----   Message from Jerrol Banana., MD sent at 05/22/2018  4:39 PM EST ----- Stable.

## 2018-05-24 DIAGNOSIS — K7460 Unspecified cirrhosis of liver: Secondary | ICD-10-CM | POA: Diagnosis not present

## 2018-05-24 DIAGNOSIS — K766 Portal hypertension: Secondary | ICD-10-CM | POA: Diagnosis not present

## 2018-05-24 DIAGNOSIS — I8500 Esophageal varices without bleeding: Secondary | ICD-10-CM | POA: Diagnosis not present

## 2018-05-24 DIAGNOSIS — N200 Calculus of kidney: Secondary | ICD-10-CM | POA: Diagnosis not present

## 2018-05-24 DIAGNOSIS — K7469 Other cirrhosis of liver: Secondary | ICD-10-CM | POA: Diagnosis not present

## 2018-06-07 ENCOUNTER — Ambulatory Visit: Payer: BLUE CROSS/BLUE SHIELD | Admitting: Family Medicine

## 2018-06-07 VITALS — BP 130/76 | HR 64 | Temp 97.7°F | Resp 16 | Wt 318.0 lb

## 2018-06-07 DIAGNOSIS — K7469 Other cirrhosis of liver: Secondary | ICD-10-CM | POA: Diagnosis not present

## 2018-06-07 DIAGNOSIS — B356 Tinea cruris: Secondary | ICD-10-CM

## 2018-06-07 DIAGNOSIS — Z6841 Body Mass Index (BMI) 40.0 and over, adult: Secondary | ICD-10-CM

## 2018-06-07 DIAGNOSIS — F410 Panic disorder [episodic paroxysmal anxiety] without agoraphobia: Secondary | ICD-10-CM

## 2018-06-07 DIAGNOSIS — I8510 Secondary esophageal varices without bleeding: Secondary | ICD-10-CM

## 2018-06-07 DIAGNOSIS — E66813 Obesity, class 3: Secondary | ICD-10-CM

## 2018-06-07 MED ORDER — FLUCONAZOLE 150 MG PO TABS: 150.0000 mg | ORAL_TABLET | Freq: Every day | ORAL | 0 refills | Status: DC

## 2018-06-07 MED ORDER — ALPRAZOLAM 0.5 MG PO TABS: 0.5000 mg | ORAL_TABLET | Freq: Every evening | ORAL | 1 refills | Status: DC | PRN

## 2018-06-07 NOTE — Progress Notes (Signed)
Jesse Zimmerman  MRN: [id number] DOB: 03-22-1961  Subjective:  HPI  The patient is a 57 year old male who presents for follow up of his November visit for kidney stones, UTI with hematuria and edema. Patient has been taken off his spironalactone.  He does have edema again.  He states his weight was 292 about 10 days ago and today it is 318. Patient also complains of a patch of yeast that he would like for you to look at. In groin,upper leg. Pt having occasional panic attacks. He plans to retire in next 2 years from [employer]. Patient Active Problem List   Diagnosis Date Noted  . Blood in stool   . Secondary esophageal varices without bleeding ([place])   . Portal hypertension ([place])   . Upper GI bleed 04/17/2017  . Contusion, back 12/26/2014  . Bruising 12/26/2014  . Back pain, chronic 11/22/2014  . Diabetes mellitus type 2 in obese ([place]) 11/22/2014  . 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 Acid reflux 11/22/2014  . HLD (hyperlipidemia) 11/22/2014  . BP (high blood pressure) 11/22/2014  . 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 Cannot sleep 11/22/2014  . Cirrhosis ([place]) 11/22/2014  . Adiposity 11/22/2014  . Change in blood platelet count 11/22/2014  . Avitaminosis D 11/22/2014  . Encephalopathy, hepatic ([place]) 05/23/2012  . 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 Chronic back pain   . Cirrhosis ([place])    NASH  . Diabetes mellitus without complication ([place])   . Esophageal varices ([place])   . Hyperlipidemia   . NASH (nonalcoholic steatohepatitis)     Social History   Socioeconomic History  . Marital status: Married    Spouse name: Velva Harman  . Number of children: 0  . Years of education: college  . Highest education level: Not on file  Occupational History  . Occupation: work at [employer]  . Financial resource strain: Not on file  . Food insecurity:    Worry: Not on file    Inability: Not on  file  . Transportation needs:    Medical: Not on file    Non-medical: Not on file  Tobacco Use  . Smoking status: Never Smoker  . Smokeless tobacco: Never Used  Substance and Sexual Activity  . Alcohol use: No  . Drug use: No  . Sexual activity: Not Currently  Lifestyle  . Physical activity:    Days per week: Not on file    Minutes per session: Not on file  . Stress: Not on file  Relationships  . Social connections:    Talks on phone: Not on file    Gets together: Not on file    Attends religious service: Not on file    Active member of club or organization: Not on file    Attends meetings of clubs or organizations: Not on file    Relationship status: Not on file  . Intimate partner violence:    Fear of current or ex partner: Not on file    Emotionally abused: Not on file    Physically abused: Not on file    Forced sexual activity: Not on file  Other Topics Concern  . Not on file  Social History Narrative  . Not on file    Outpatient Encounter Medications as of 06/07/2018  Medication Sig  . CANAGLIFLOZIN PO Take 1,000 mg by mouth daily at 2 PM.  . cyclobenzaprine (FLEXERIL)  5 MG tablet Take 1 tablet (5 mg total) by mouth 3 (three) times daily as needed for muscle spasms.  . furosemide (LASIX) 20 MG tablet 1-2 pills daily  . HYDROcodone-acetaminophen (NORCO) 10-325 MG tablet Take 1 tablet by mouth every 4 (four) hours as needed.  . INVOKAMET XR 50-1000 MG TB24 TAKE 1 TABLET BY MOUTH  DAILY  . losartan (COZAAR) 25 MG tablet Take 1 tablet (25 mg total) by mouth daily.  . metolazone (ZAROXOLYN) 2.5 MG tablet Take 1 tablet (2.5 mg total) by mouth daily.  . mupirocin ointment (BACTROBAN) 2 % Place 1 application into the nose 2 (two) times daily.  . ondansetron (ZOFRAN) 4 MG tablet Take 1 tablet (4 mg total) by mouth every 8 (eight) hours as needed for nausea or vomiting.  Marland Kitchen oxyCODONE (ROXICODONE) 5 MG immediate release tablet Take 1 tablet (5 mg total) by mouth every 8 (eight)  hours as needed.  . pantoprazole (PROTONIX) 40 MG tablet Take 1 tablet (40 mg total) by mouth 2 (two) times daily.  . promethazine (PHENERGAN) 25 MG suppository Place 1 suppository (25 mg total) rectally every 6 (six) hours as needed for nausea or vomiting.  . propranolol (INDERAL) 10 MG tablet Take 10 mg by mouth 2 (two) times daily.  . sildenafil (REVATIO) 20 MG tablet 2 to 4 tablets daily as needed  . tamsulosin (FLOMAX) 0.4 MG CAPS capsule Take 1 capsule (0.4 mg total) by mouth daily.  . Vitamin D, Ergocalciferol, (DRISDOL) 50000 units CAPS capsule Take 1 capsule (50,000 Units total) by mouth every 7 (seven) days.  . hydrocortisone (ANUSOL-HC) 25 MG suppository Place 1 suppository (25 mg total) rectally at bedtime as needed for hemorrhoids or anal itching. (Patient not taking: Reported on 06/07/2018)  . [DISCONTINUED] scopolamine (TRANSDERM-SCOP) 1 MG/3DAYS Place 1 patch (1.5 mg total) onto the skin every 3 (three) days. (Patient not taking: Reported on 06/07/2018)   No facility-administered encounter medications on file as of 06/07/2018.     Allergies  Allergen Reactions  . Latex     LATEX TAPE  . Pineapple Hives    Review of Systems  Constitutional: Positive for malaise/fatigue. Negative for fever.  HENT: Negative.   Eyes: Negative.   Respiratory: Positive for cough. Negative for shortness of breath and wheezing.   Cardiovascular: Positive for leg swelling. Negative for chest pain and palpitations.  Gastrointestinal: Negative.   Skin: Positive for rash.  Neurological: Negative.   Endo/Heme/Allergies: Negative.   Psychiatric/Behavioral: Negative.     Objective:  BP 130/76 (BP Location: Right Arm, Patient Position: Sitting, Cuff Size: Normal)   Pulse 64   Temp 97.7 F (36.5 C) (Oral)   Resp 16   Wt (!) 318 lb (144.2 kg)   SpO2 99%   BMI 43.13 kg/m   Physical Exam  Constitutional: He is oriented to person, place, and time and well-developed, well-nourished, and in no  distress.  HENT:  Head: Normocephalic and atraumatic.  Eyes: Conjunctivae are normal. No scleral icterus.  Neck: No thyromegaly present.  Cardiovascular: Normal rate, regular rhythm, normal heart sounds and intact distal pulses. Exam reveals no gallop.  No murmur heard. Pulmonary/Chest: Effort normal and breath sounds normal. No respiratory distress. He has no wheezes.  Abdominal: Soft.  Musculoskeletal:        General: Edema (1+) present.     Comments: 1+ edema.  Neurological: He is alert and oriented to person, place, and time.  Skin: Skin is warm and dry.  Rash upper inner  left leg c/w healing Tinea cruruis.  Psychiatric: Mood, memory, affect and judgment normal.    Assessment and Plan :  1. Panic attacks Xanax 0.5 mg q day prn. May need referral or other meds/SSRI.  2. Tinea cruris Diflucan times 3 days.  3. Secondary esophageal varices without bleeding (HCC)   4. Other cirrhosis of liver (HCC)   5. Class 3 severe obesity due to excess calories with serious comorbidity and body mass index (BMI) of 40.0 to 44.9 in adult [facility]) NASAH/GERD/DN  I have done the exam and reviewed the chart and it is accurate to the best of my knowledge. Development worker, community has been used and  any errors in dictation or transcription are unintentional. Miguel Aschoff M.D. [facility]

## 2018-06-08 ENCOUNTER — Other Ambulatory Visit: Payer: Self-pay

## 2018-06-08 DIAGNOSIS — R600 Localized edema: Secondary | ICD-10-CM

## 2018-06-08 MED ORDER — FUROSEMIDE 20 MG PO TABS: ORAL_TABLET | ORAL | 1 refills | Status: DC

## 2018-06-08 NOTE — Telephone Encounter (Signed)
Patient had called  Office today stating that he mispalced his "fluid pill" and asking if we can replace Rx and send it to [facility] [address]. KW

## 2018-06-08 NOTE — Telephone Encounter (Signed)
Please review. Thanks!  

## 2018-06-30 ENCOUNTER — Ambulatory Visit: Payer: BLUE CROSS/BLUE SHIELD | Admitting: Family Medicine

## 2018-06-30 ENCOUNTER — Encounter: Payer: Self-pay | Admitting: Family Medicine

## 2018-06-30 ENCOUNTER — Other Ambulatory Visit: Payer: Self-pay

## 2018-06-30 VITALS — BP 112/60 | HR 70 | Temp 98.4°F | Ht 72.0 in | Wt 318.6 lb

## 2018-06-30 DIAGNOSIS — S300XXA Contusion of lower back and pelvis, initial encounter: Secondary | ICD-10-CM | POA: Diagnosis not present

## 2018-06-30 DIAGNOSIS — S63502A Unspecified sprain of left wrist, initial encounter: Secondary | ICD-10-CM | POA: Diagnosis not present

## 2018-06-30 DIAGNOSIS — J3089 Other allergic rhinitis: Secondary | ICD-10-CM | POA: Diagnosis not present

## 2018-06-30 MED ORDER — DOXYCYCLINE HYCLATE 100 MG PO TABS: 100.0000 mg | ORAL_TABLET | Freq: Two times a day (BID) | ORAL | 0 refills | Status: DC

## 2018-06-30 MED ORDER — FLUTICASONE PROPIONATE 50 MCG/ACT NA SUSP: 2.0000 | Freq: Every day | NASAL | 6 refills | Status: DC

## 2018-06-30 NOTE — Patient Instructions (Addendum)
Resume Claritin and steroid nasal spray. Start the antibiotic if cough continues to be productive.

## 2018-06-30 NOTE — Progress Notes (Signed)
  Subjective:     Patient ID: Jesse Zimmerman, male   DOB: 11-29-1960, 58 y.o.   MRN: [id number] Chief Complaint  Patient presents with  . Fall    06/28/18 thinks he bruised his tailbone and wrist left hand.  cut head also  . Cough    congestion   HPI Reports he fell backwards onto his tailbone and left wrist while trying to manipulate a full wheelbarrow. States wrist has not been swollen. Localizes back pain to his coccyx area. Also reports chronic PND and cough which he attributes to allergies. States it has worsened in the last two weeks and he has started to see colored sputum. He has not been taking any medication for this. He is accompanied by his wife today.  Review of Systems     Objective:   Physical Exam Constitutional:      General: He is not in acute distress.    Appearance: He is not ill-appearing.  Musculoskeletal:     Comments: Muscle strength in lower extremities 5/5. SLR's to 60 degrees without radiation of back pain. Mild tenderness over his coccyx without crepitus. Left wrist with mild dorsal tenderness but no erythema or deformity. WF/WE 5/5. No increased pain with ligamental testing  Neurological:     Mental Status: He is alert.   Ears: T.M's intact without inflammation Throat: tonsils absent Neck: no cervical adenopathy Lungs: clear     Assessment:    1. Contusion of coccyx, initial encounter  2. Sprain of left wrist, initial encounter  3. Non-seasonal allergic rhinitis, unspecified trigger: ? Early bronchitis - doxycycline (VIBRA-TABS) 100 MG tablet; Take 1 tablet (100 mg total) by mouth 2 (two) times daily.  Dispense: 14 tablet; Refill: 0 - fluticasone (FLONASE) 50 MCG/ACT nasal spray; Place 2 sprays into both nostrils daily.  Dispense: 16 g; Refill: 6    Plan:    Resume Claritin and Flonase. If persistent purulent drainage to start the abx. Tylenol for back and wrist discomfort with cold compresses.

## 2018-07-03 ENCOUNTER — Telehealth: Payer: Self-pay | Admitting: Family Medicine

## 2018-07-03 ENCOUNTER — Ambulatory Visit
Admission: RE | Admit: 2018-07-03 | Discharge: 2018-07-03 | Disposition: A | Discharge status: Home / Self Care | Payer: BLUE CROSS/BLUE SHIELD | Attending: Family Medicine | Admitting: Family Medicine

## 2018-07-03 ENCOUNTER — Telehealth: Payer: Self-pay

## 2018-07-03 ENCOUNTER — Other Ambulatory Visit: Payer: Self-pay | Admitting: Family Medicine

## 2018-07-03 ENCOUNTER — Ambulatory Visit
Admission: RE | Admit: 2018-07-03 | Discharge: 2018-07-03 | Disposition: A | Discharge status: Home / Self Care | Payer: BLUE CROSS/BLUE SHIELD | Source: Ambulatory Visit | Attending: Family Medicine | Admitting: Family Medicine

## 2018-07-03 DIAGNOSIS — S6992XA Unspecified injury of left wrist, hand and finger(s), initial encounter: Secondary | ICD-10-CM | POA: Diagnosis not present

## 2018-07-03 DIAGNOSIS — M79642 Pain in left hand: Secondary | ICD-10-CM

## 2018-07-03 DIAGNOSIS — M7989 Other specified soft tissue disorders: Secondary | ICD-10-CM | POA: Diagnosis not present

## 2018-07-03 NOTE — Telephone Encounter (Signed)
Patient was advised and stated that his swelling has gotten worse. Patient states he has been applying ice to the area and also taking Tylenol. Patient wants to know what he should do about the swelling

## 2018-07-03 NOTE — Telephone Encounter (Signed)
ACE wrap during the day, elevation and icing for 20 minutes several times today.

## 2018-07-03 NOTE — Telephone Encounter (Signed)
Pt needing a call back on his hand.  Not sure what he will need to do to treat it.  Thanks, [employer]

## 2018-07-03 NOTE — Telephone Encounter (Signed)
Patient was advised. Patient stated that he will go get a ACE wrap and continue to apply the ice/ heat.

## 2018-07-03 NOTE — Telephone Encounter (Signed)
Patient was advised in a different encounter.

## 2018-07-03 NOTE — Telephone Encounter (Signed)
-----   Message from Carmon Ginsberg, [place] sent at 07/03/2018  9:45 AM EST ----- No fractures

## 2018-07-04 ENCOUNTER — Ambulatory Visit
Admission: RE | Admit: 2018-07-04 | Discharge: 2018-07-04 | Disposition: A | Discharge status: Home / Self Care | Payer: BLUE CROSS/BLUE SHIELD | Attending: Family Medicine | Admitting: Family Medicine

## 2018-07-04 ENCOUNTER — Ambulatory Visit: Payer: BLUE CROSS/BLUE SHIELD | Admitting: Family Medicine

## 2018-07-04 ENCOUNTER — Ambulatory Visit
Admission: RE | Admit: 2018-07-04 | Discharge: 2018-07-04 | Disposition: A | Discharge status: Home / Self Care | Payer: BLUE CROSS/BLUE SHIELD | Source: Ambulatory Visit | Attending: Family Medicine | Admitting: Family Medicine

## 2018-07-04 ENCOUNTER — Encounter: Payer: Self-pay | Admitting: Family Medicine

## 2018-07-04 VITALS — BP 140/68 | HR 83 | Temp 98.7°F | Resp 16 | Wt 319.8 lb

## 2018-07-04 DIAGNOSIS — M25532 Pain in left wrist: Secondary | ICD-10-CM | POA: Insufficient documentation

## 2018-07-04 DIAGNOSIS — S6992XA Unspecified injury of left wrist, hand and finger(s), initial encounter: Secondary | ICD-10-CM | POA: Diagnosis not present

## 2018-07-04 DIAGNOSIS — M25539 Pain in unspecified wrist: Secondary | ICD-10-CM | POA: Insufficient documentation

## 2018-07-04 MED ORDER — HYDROCODONE-ACETAMINOPHEN 5-325 MG PO TABS: ORAL_TABLET | ORAL | 0 refills | Status: DC

## 2018-07-04 NOTE — Progress Notes (Signed)
  Subjective:     Patient ID: Jesse Zimmerman, male   DOB: November 20, 1960, 58 y.o.   MRN: [id number] Chief Complaint  Patient presents with  . Hand Pain    Patient returns back to office today for follow up from 07/03/18. Patient states that pain in left hand is worse and states that on Sunday 1/12 when trying to pick up bag at grocery store he heard a "pop" sound. Patient reports pain and swelling on left hand radiating up to his elbow. Patient has been taking otc Tylenol and prescription oxycodone with no  relief.    HPI Originally injured his left wrist on 1/8 with o.v. 1/10. X-ray of left hand negative. He reports increased pain/swelling of his left hand/wrist with radiation up his arm. Accompanied by his wife today.  Review of Systems     Objective:   Physical Exam Constitutional:      General: He is in acute distress (moderate pain).  Musculoskeletal:        General: Tenderness (over dorsum of hand and wrist) present. No swelling (mild swelling and erythema of left hand Annye Asa.).     Comments: Left radial and ulnar pulses 2+.  Skin:    Comments: Mild erythema over dorsum of his left hand and wrist.  Neurological:     Mental Status: He is alert.        Assessment:    1. Left wrist pain: rx for hydrocodone - DG Wrist Complete Left; Future    Plan:    Discussed use of ACE wrap and elevation pending x-ray report. May take one dose of ibuprofen.

## 2018-07-04 NOTE — Patient Instructions (Signed)
We will call you with the x-ray report. Take ibuprofen once and use the ACE wrap during the day.Marland Kitchen

## 2018-07-05 ENCOUNTER — Other Ambulatory Visit: Payer: Self-pay | Admitting: Family Medicine

## 2018-07-05 MED ORDER — CEPHALEXIN 500 MG PO CAPS: 500.0000 mg | ORAL_CAPSULE | Freq: Four times a day (QID) | ORAL | 0 refills | Status: DC

## 2018-07-10 ENCOUNTER — Telehealth: Payer: Self-pay | Admitting: Family Medicine

## 2018-07-10 NOTE — Telephone Encounter (Signed)
Please review

## 2018-07-10 NOTE — Telephone Encounter (Signed)
On day 5 of 8 taking antibiotics for hand.  It's currently hot, red and really swollen. Still not having pain and asking what else can be done to help.  Please advise.  Thanks, [employer]

## 2018-07-10 NOTE — Telephone Encounter (Signed)
Will bring him in to see Dr. Darnell Level. Tomorrow. If he has a fever-to the ER.

## 2018-07-11 ENCOUNTER — Encounter: Payer: Self-pay | Admitting: Family Medicine

## 2018-07-11 ENCOUNTER — Ambulatory Visit: Payer: BLUE CROSS/BLUE SHIELD | Admitting: Family Medicine

## 2018-07-11 DIAGNOSIS — L03818 Cellulitis of other sites: Secondary | ICD-10-CM

## 2018-07-11 DIAGNOSIS — J3089 Other allergic rhinitis: Secondary | ICD-10-CM | POA: Diagnosis not present

## 2018-07-11 MED ORDER — HYDROCODONE-ACETAMINOPHEN 10-325 MG PO TABS: ORAL_TABLET | ORAL | 0 refills | Status: DC

## 2018-07-11 MED ORDER — DOXYCYCLINE HYCLATE 100 MG PO TABS: 100.0000 mg | ORAL_TABLET | Freq: Two times a day (BID) | ORAL | 0 refills | Status: DC

## 2018-07-11 NOTE — Progress Notes (Signed)
Patient: Jesse Zimmerman Male    DOB: 1960-07-18   58 y.o.   MRN: [id number] Visit Date: 07/11/2018  Today's Provider: Wilhemena Durie, MD   Chief Complaint  Patient presents with  . Wrist Pain   Subjective:     Wrist Pain   The pain is present in the left wrist and left elbow. This is a recurrent problem. The current episode started in the past 7 days. The problem occurs constantly. The problem has been gradually worsening. The quality of the pain is described as aching and dull. The pain is at a severity of 7/10. The pain is severe. Associated symptoms include an inability to bear weight, a limited range of motion, numbness and stiffness. The symptoms are aggravated by activity. He has tried acetaminophen and NSAIDS for the symptoms. The treatment provided no relief.    Allergies  Allergen Reactions  . Latex     LATEX TAPE  . Pineapple Hives     Current Outpatient Medications:  .  ALPRAZolam (XANAX) 0.5 MG tablet, Take 1 tablet (0.5 mg total) by mouth at bedtime as needed for anxiety., Disp: 30 tablet, Rfl: 1 .  cephALEXin (KEFLEX) 500 MG capsule, Take 1 capsule (500 mg total) by mouth 4 (four) times daily., Disp: 28 capsule, Rfl: 0 .  fluticasone (FLONASE) 50 MCG/ACT nasal spray, Place 2 sprays into both nostrils daily., Disp: 16 g, Rfl: 6 .  furosemide (LASIX) 20 MG tablet, 1-2 pills daily, Disp: 180 tablet, Rfl: 1 .  HYDROcodone-acetaminophen (NORCO/VICODIN) 5-325 MG tablet, One every 4-6 hours as needed for pain, Disp: 20 tablet, Rfl: 0 .  hydrocortisone (ANUSOL-HC) 25 MG suppository, Place 1 suppository (25 mg total) rectally at bedtime as needed for hemorrhoids or anal itching., Disp: 12 suppository, Rfl: 5 .  INVOKAMET XR 50-1000 MG TB24, TAKE 1 TABLET BY MOUTH  DAILY, Disp: 120 tablet, Rfl: 3 .  losartan (COZAAR) 25 MG tablet, Take 1 tablet (25 mg total) by mouth daily., Disp: 90 tablet, Rfl: 3 .  metolazone (ZAROXOLYN) 2.5 MG tablet, Take 1 tablet (2.5 mg  total) by mouth daily., Disp: 30 tablet, Rfl: 5 .  ondansetron (ZOFRAN) 4 MG tablet, Take 1 tablet (4 mg total) by mouth every 8 (eight) hours as needed for nausea or vomiting., Disp: 45 tablet, Rfl: 1 .  pantoprazole (PROTONIX) 40 MG tablet, Take 1 tablet (40 mg total) by mouth 2 (two) times daily., Disp: 60 tablet, Rfl: 0 .  propranolol (INDERAL) 10 MG tablet, Take 10 mg by mouth 2 (two) times daily., Disp: , Rfl:  .  sildenafil (REVATIO) 20 MG tablet, 2 to 4 tablets daily as needed, Disp: 100 tablet, Rfl: 5 .  tamsulosin (FLOMAX) 0.4 MG CAPS capsule, Take 1 capsule (0.4 mg total) by mouth daily., Disp: 90 capsule, Rfl: 3 .  Vitamin D, Ergocalciferol, (DRISDOL) 50000 units CAPS capsule, Take 1 capsule (50,000 Units total) by mouth every 7 (seven) days., Disp: 12 capsule, Rfl: 3 .  doxycycline (VIBRA-TABS) 100 MG tablet, Take 1 tablet (100 mg total) by mouth 2 (two) times daily. (Patient not taking: Reported on 07/11/2018), Disp: 14 tablet, Rfl: 0 .  fluconazole (DIFLUCAN) 150 MG tablet, Take 1 tablet (150 mg total) by mouth daily. (Patient not taking: Reported on 07/11/2018), Disp: 3 tablet, Rfl: 0  Review of Systems  Constitutional: Negative.   Respiratory: Negative.   Cardiovascular: Negative.   Musculoskeletal: Positive for arthralgias and stiffness.  Neurological: Positive for numbness.  Psychiatric/Behavioral: Negative.     Social History   Tobacco Use  . Smoking status: Never Smoker  . Smokeless tobacco: Never Used  Substance Use Topics  . Alcohol use: No      Objective:   Wt (!) 328 lb 3.2 oz (148.9 kg)   BMI 44.51 kg/m  Vitals:   07/11/18 1023  Weight: (!) 328 lb 3.2 oz (148.9 kg)     Physical Exam Constitutional:      Appearance: He is well-developed.  HENT:     Head: Normocephalic and atraumatic.     Right Ear: External ear normal.     Left Ear: External ear normal.     Nose: Nose normal.  Eyes:     General: No scleral icterus.    Conjunctiva/sclera:  Conjunctivae normal.  Neck:     Thyroid: No thyromegaly.  Cardiovascular:     Rate and Rhythm: Normal rate and regular rhythm.     Heart sounds: Normal heart sounds.  Pulmonary:     Effort: Pulmonary effort is normal.     Breath sounds: Normal breath sounds.  Abdominal:     Palpations: Abdomen is soft.     Tenderness: There is no abdominal tenderness.  Musculoskeletal:     Comments: 1-2+ edema. He has mild swelling and tenderness below left elbow down into hand. No open wounds. Minimal warmth. Good ROM.  Skin:    General: Skin is warm and dry.  Neurological:     General: No focal deficit present.     Mental Status: He is alert and oriented to person, place, and time.  Psychiatric:        Mood and Affect: Mood normal.        Behavior: Behavior normal.        Thought Content: Thought content normal.        Judgment: Judgment normal.         Assessment & Plan    1. Cellulitis of other specified site May need ortho referral. - doxycycline (VIBRA-TABS) 100 MG tablet; Take 1 tablet (100 mg total) by mouth 2 (two) times daily.  Dispense: 20 tablet; Refill: 0 - CBC with Differential/Platelet - Renal function panel - Uric acid - HYDROcodone-acetaminophen (NORCO) 10-325 MG tablet; Take 1/2 tablet by mouth every 4 hours as needed  Dispense: 30 tablet; Refill: 0  2. Non-seasonal allergic rhinitis, unspecified trigger   I, Porsha McClurkin, CMA, am acting as a scribe for Wilhemena Durie, MD.     I have done the exam and reviewed the above chart and it is accurate to the best of my knowledge. Development worker, community has been used in this note in any air is in the dictation or transcription are unintentional.  Wilhemena Durie, MD  [employer]

## 2018-07-12 ENCOUNTER — Ambulatory Visit: Payer: Self-pay | Admitting: Family Medicine

## 2018-07-12 LAB — CBC WITH DIFFERENTIAL/PLATELET
Basophils Absolute: 0.1 10*3/uL (ref 0.0–0.2)
Basos: 1 %
EOS (ABSOLUTE): 0.3 10*3/uL (ref 0.0–0.4)
Eos: 6 %
Hematocrit: 30.7 % — ABNORMAL LOW (ref 37.5–51.0)
Hemoglobin: 10.7 g/dL — ABNORMAL LOW (ref 13.0–17.7)
Immature Grans (Abs): 0 10*3/uL (ref 0.0–0.1)
Immature Granulocytes: 0 %
Lymphocytes Absolute: 0.9 10*3/uL (ref 0.7–3.1)
Lymphs: 18 %
MCH: 32.5 pg (ref 26.6–33.0)
MCHC: 34.9 g/dL (ref 31.5–35.7)
MCV: 93 fL (ref 79–97)
Monocytes Absolute: 0.4 10*3/uL (ref 0.1–0.9)
Monocytes: 8 %
Neutrophils Absolute: 3.4 10*3/uL (ref 1.4–7.0)
Neutrophils: 67 %
Platelets: 61 10*3/uL — CL (ref 150–450)
RBC: 3.29 x10E6/uL — ABNORMAL LOW (ref 4.14–5.80)
RDW: 14.1 % (ref 11.6–15.4)
WBC: 5.1 10*3/uL (ref 3.4–10.8)

## 2018-07-12 LAB — RENAL FUNCTION PANEL
Albumin: 2.9 g/dL — ABNORMAL LOW (ref 3.8–4.9)
BUN/Creatinine Ratio: 13 (ref 9–20)
BUN: 14 mg/dL (ref 6–24)
CO2: 23 mmol/L (ref 20–29)
Calcium: 8.1 mg/dL — ABNORMAL LOW (ref 8.7–10.2)
Chloride: 104 mmol/L (ref 96–106)
Creatinine, Ser: 1.06 mg/dL (ref 0.76–1.27)
GFR calc Af Amer: 90 mL/min/{1.73_m2} (ref >59)
GFR calc non Af Amer: 78 mL/min/{1.73_m2} (ref >59)
Glucose: 270 mg/dL — ABNORMAL HIGH (ref 65–99)
Phosphorus: 2.7 mg/dL — ABNORMAL LOW (ref 2.8–4.1)
Potassium: 4.1 mmol/L (ref 3.5–5.2)
Sodium: 136 mmol/L (ref 134–144)

## 2018-07-12 LAB — URIC ACID: Uric Acid: 3.8 mg/dL (ref 3.7–8.6)

## 2018-07-13 ENCOUNTER — Ambulatory Visit: Payer: BLUE CROSS/BLUE SHIELD | Admitting: Family Medicine

## 2018-07-13 VITALS — BP 130/70 | HR 67 | Temp 97.8°F | Resp 16 | Wt 328.0 lb

## 2018-07-13 DIAGNOSIS — L03114 Cellulitis of left upper limb: Secondary | ICD-10-CM | POA: Diagnosis not present

## 2018-07-13 NOTE — Progress Notes (Signed)
Jesse Zimmerman  MRN: [id number] DOB: 1961/02/13  Subjective:  HPI   The patient is a 58 year old male who presents today for follow up of continued cellulitis of the left hand/wrist.  He is on his second round of antibiotics and states that it does not appear to be quite as swollen.  Slightly improved thing, maybe 20% better.  Patient Active Problem List   Diagnosis Date Noted  . Blood in stool   . Secondary esophageal varices without bleeding ([place])   . Portal hypertension ([place])   . Upper GI bleed 04/17/2017  . Contusion, back 12/26/2014  . Bruising 12/26/2014  . Back pain, chronic 11/22/2014  . Diabetes mellitus type 2 in obese ([place]) 11/22/2014  . 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 Acid reflux 11/22/2014  . HLD (hyperlipidemia) 11/22/2014  . BP (high blood pressure) 11/22/2014  . 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 Cannot sleep 11/22/2014  . Cirrhosis ([place]) 11/22/2014  . Adiposity 11/22/2014  . Change in blood platelet count 11/22/2014  . Avitaminosis D 11/22/2014  . Encephalopathy, hepatic ([place]) 05/23/2012  . 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 Chronic back pain   . Cirrhosis ([place])    NASH  . Diabetes mellitus without complication ([place])   . Esophageal varices ([place])   . Hyperlipidemia   . NASH (nonalcoholic steatohepatitis)     Social History   Socioeconomic History  . Marital status: Married    Spouse name: Velva Harman  . Number of children: 0  . Years of education: college  . Highest education level: Not on file  Occupational History  . Occupation: work at [employer]  . Financial resource strain: Not on file  . Food insecurity:    Worry: Not on file    Inability: Not on file  . Transportation needs:    Medical: Not on file    Non-medical: Not on file  Tobacco Use  . Smoking status: Never Smoker  . Smokeless tobacco: Never Used  Substance and Sexual Activity  .  Alcohol use: No  . Drug use: No  . Sexual activity: Not Currently  Lifestyle  . Physical activity:    Days per week: Not on file    Minutes per session: Not on file  . Stress: Not on file  Relationships  . Social connections:    Talks on phone: Not on file    Gets together: Not on file    Attends religious service: Not on file    Active member of club or organization: Not on file    Attends meetings of clubs or organizations: Not on file    Relationship status: Not on file  . Intimate partner violence:    Fear of current or ex partner: Not on file    Emotionally abused: Not on file    Physically abused: Not on file    Forced sexual activity: Not on file  Other Topics Concern  . Not on file  Social History Narrative  . Not on file    Outpatient Encounter Medications as of 07/13/2018  Medication Sig  . ALPRAZolam (XANAX) 0.5 MG tablet Take 1 tablet (0.5 mg total) by mouth at bedtime as needed for anxiety.  . cephALEXin (KEFLEX) 500 MG capsule Take 1 capsule (500 mg total) by mouth 4 (four) times daily.  Marland Kitchen doxycycline (VIBRA-TABS) 100 MG tablet Take 1 tablet (100 mg total) by mouth  2 (two) times daily.  Marland Kitchen doxycycline (VIBRA-TABS) 100 MG tablet Take 1 tablet (100 mg total) by mouth 2 (two) times daily.  . fluconazole (DIFLUCAN) 150 MG tablet Take 1 tablet (150 mg total) by mouth daily.  . fluticasone (FLONASE) 50 MCG/ACT nasal spray Place 2 sprays into both nostrils daily.  . furosemide (LASIX) 20 MG tablet 1-2 pills daily  . HYDROcodone-acetaminophen (NORCO) 10-325 MG tablet Take 1/2 tablet by mouth every 4 hours as needed  . hydrocortisone (ANUSOL-HC) 25 MG suppository Place 1 suppository (25 mg total) rectally at bedtime as needed for hemorrhoids or anal itching.  . INVOKAMET XR 50-1000 MG TB24 TAKE 1 TABLET BY MOUTH  DAILY  . losartan (COZAAR) 25 MG tablet Take 1 tablet (25 mg total) by mouth daily.  . metolazone (ZAROXOLYN) 2.5 MG tablet Take 1 tablet (2.5 mg total) by mouth  daily.  . ondansetron (ZOFRAN) 4 MG tablet Take 1 tablet (4 mg total) by mouth every 8 (eight) hours as needed for nausea or vomiting.  . pantoprazole (PROTONIX) 40 MG tablet Take 1 tablet (40 mg total) by mouth 2 (two) times daily.  . propranolol (INDERAL) 10 MG tablet Take 10 mg by mouth 2 (two) times daily.  . sildenafil (REVATIO) 20 MG tablet 2 to 4 tablets daily as needed  . tamsulosin (FLOMAX) 0.4 MG CAPS capsule Take 1 capsule (0.4 mg total) by mouth daily.  . Vitamin D, Ergocalciferol, (DRISDOL) 50000 units CAPS capsule Take 1 capsule (50,000 Units total) by mouth every 7 (seven) days.   No facility-administered encounter medications on file as of 07/13/2018.     Allergies  Allergen Reactions  . Latex     LATEX TAPE  . Pineapple Hives    Review of Systems  Constitutional: Negative for fever.  HENT: Negative.   Eyes: Negative.   Cardiovascular: Positive for leg swelling. Negative for chest pain and palpitations.  Gastrointestinal: Negative.   Musculoskeletal: Positive for joint pain.  Skin: Negative for itching and rash.  Neurological: Negative.   Endo/Heme/Allergies: Negative.   Psychiatric/Behavioral: Negative.     Objective:  BP 130/70 (BP Location: Right Arm, Patient Position: Sitting, Cuff Size: Large)   Pulse 67   Temp 97.8 F (36.6 C) (Oral)   Resp 16   Wt (!) 328 lb (148.8 kg)   SpO2 99%   BMI 44.48 kg/m   Physical Exam  Constitutional: He is oriented to person, place, and time and well-developed, well-nourished, and in no distress.  HENT:  Head: Normocephalic and atraumatic.  Right Ear: External ear normal.  Left Ear: External ear normal.  Nose: Nose normal.  Eyes: Conjunctivae are normal. No scleral icterus.  Neck: No thyromegaly present.  Cardiovascular: Normal rate and regular rhythm.  Pulmonary/Chest: Effort normal and breath sounds normal.  Abdominal: Soft.  Musculoskeletal:     Comments: Very mild but discernible swelling of the left hand  wrist and forearm  up to the elbow.  No epitrochlear nodes or streaking of the skin up the arm.  No axillary adenopathy  Neurological: He is alert and oriented to person, place, and time. Gait normal. GCS score is 15.  Skin: Skin is warm and dry.  Psychiatric: Mood, memory, affect and judgment normal.    Assessment and Plan :  1. Cellulitis of left upper extremity Very slowly improving.  No obvious etiology at this time.  Try doxycycline and return to clinic 1 week.  I have done the exam and reviewed the chart and it  is accurate to the best of my knowledge. Development worker, community has been used and  any errors in dictation or transcription are unintentional. Miguel Aschoff M.D. [facility]

## 2018-07-17 ENCOUNTER — Encounter: Payer: Self-pay | Admitting: Emergency Medicine

## 2018-07-17 ENCOUNTER — Inpatient Hospital Stay
Admission: EM | Admit: 2018-07-17 | Discharge: 2018-07-19 | DRG: 378 | Disposition: A | Discharge status: Home / Self Care | Payer: BLUE CROSS/BLUE SHIELD | Attending: Specialist | Admitting: Specialist

## 2018-07-17 ENCOUNTER — Telehealth: Payer: Self-pay

## 2018-07-17 ENCOUNTER — Other Ambulatory Visit: Payer: Self-pay

## 2018-07-17 DIAGNOSIS — T39395A Adverse effect of other nonsteroidal anti-inflammatory drugs [NSAID], initial encounter: Secondary | ICD-10-CM | POA: Diagnosis present

## 2018-07-17 DIAGNOSIS — Z803 Family history of malignant neoplasm of breast: Secondary | ICD-10-CM

## 2018-07-17 DIAGNOSIS — K7581 Nonalcoholic steatohepatitis (NASH): Secondary | ICD-10-CM | POA: Diagnosis present

## 2018-07-17 DIAGNOSIS — Z7951 Long term (current) use of inhaled steroids: Secondary | ICD-10-CM

## 2018-07-17 DIAGNOSIS — Z23 Encounter for immunization: Secondary | ICD-10-CM

## 2018-07-17 DIAGNOSIS — E1169 Type 2 diabetes mellitus with other specified complication: Secondary | ICD-10-CM

## 2018-07-17 DIAGNOSIS — Z8249 Family history of ischemic heart disease and other diseases of the circulatory system: Secondary | ICD-10-CM

## 2018-07-17 DIAGNOSIS — G8929 Other chronic pain: Secondary | ICD-10-CM | POA: Diagnosis not present

## 2018-07-17 DIAGNOSIS — M549 Dorsalgia, unspecified: Secondary | ICD-10-CM | POA: Diagnosis present

## 2018-07-17 DIAGNOSIS — E119 Type 2 diabetes mellitus without complications: Secondary | ICD-10-CM | POA: Diagnosis not present

## 2018-07-17 DIAGNOSIS — E669 Obesity, unspecified: Secondary | ICD-10-CM

## 2018-07-17 DIAGNOSIS — E785 Hyperlipidemia, unspecified: Secondary | ICD-10-CM | POA: Diagnosis present

## 2018-07-17 DIAGNOSIS — K2970 Gastritis, unspecified, without bleeding: Secondary | ICD-10-CM | POA: Diagnosis not present

## 2018-07-17 DIAGNOSIS — K921 Melena: Secondary | ICD-10-CM | POA: Diagnosis not present

## 2018-07-17 DIAGNOSIS — I10 Essential (primary) hypertension: Secondary | ICD-10-CM | POA: Diagnosis not present

## 2018-07-17 DIAGNOSIS — K250 Acute gastric ulcer with hemorrhage: Principal | ICD-10-CM

## 2018-07-17 DIAGNOSIS — Z91040 Latex allergy status: Secondary | ICD-10-CM | POA: Diagnosis not present

## 2018-07-17 DIAGNOSIS — K219 Gastro-esophageal reflux disease without esophagitis: Secondary | ICD-10-CM | POA: Diagnosis present

## 2018-07-17 DIAGNOSIS — I8510 Secondary esophageal varices without bleeding: Secondary | ICD-10-CM | POA: Diagnosis not present

## 2018-07-17 DIAGNOSIS — D62 Acute posthemorrhagic anemia: Secondary | ICD-10-CM | POA: Diagnosis present

## 2018-07-17 DIAGNOSIS — Z800 Family history of malignant neoplasm of digestive organs: Secondary | ICD-10-CM

## 2018-07-17 DIAGNOSIS — K2900 Acute gastritis without bleeding: Secondary | ICD-10-CM

## 2018-07-17 DIAGNOSIS — K922 Gastrointestinal hemorrhage, unspecified: Secondary | ICD-10-CM | POA: Diagnosis present

## 2018-07-17 DIAGNOSIS — Z91018 Allergy to other foods: Secondary | ICD-10-CM

## 2018-07-17 DIAGNOSIS — K7460 Unspecified cirrhosis of liver: Secondary | ICD-10-CM | POA: Diagnosis not present

## 2018-07-17 DIAGNOSIS — K259 Gastric ulcer, unspecified as acute or chronic, without hemorrhage or perforation: Secondary | ICD-10-CM | POA: Diagnosis not present

## 2018-07-17 LAB — COMPREHENSIVE METABOLIC PANEL
ALT: 27 U/L (ref 0–44)
AST: 50 U/L — ABNORMAL HIGH (ref 15–41)
Albumin: 2.8 g/dL — ABNORMAL LOW (ref 3.5–5.0)
Alkaline Phosphatase: 128 U/L — ABNORMAL HIGH (ref 38–126)
Anion gap: 4 — ABNORMAL LOW (ref 5–15)
BUN: 20 mg/dL (ref 6–20)
CO2: 27 mmol/L (ref 22–32)
Calcium: 8.1 mg/dL — ABNORMAL LOW (ref 8.9–10.3)
Chloride: 104 mmol/L (ref 98–111)
Creatinine, Ser: 0.9 mg/dL (ref 0.61–1.24)
GFR calc Af Amer: >60 mL/min (ref >60)
GFR calc non Af Amer: >60 mL/min (ref >60)
Glucose, Bld: 196 mg/dL — ABNORMAL HIGH (ref 70–99)
Potassium: 4.1 mmol/L (ref 3.5–5.1)
Sodium: 135 mmol/L (ref 135–145)
Total Bilirubin: 2.9 mg/dL — ABNORMAL HIGH (ref 0.3–1.2)
Total Protein: 6.9 g/dL (ref 6.5–8.1)

## 2018-07-17 LAB — CBC
HCT: 30.1 % — ABNORMAL LOW (ref 39.0–52.0)
Hemoglobin: 10.4 g/dL — ABNORMAL LOW (ref 13.0–17.0)
MCH: 33 pg (ref 26.0–34.0)
MCHC: 34.6 g/dL (ref 30.0–36.0)
MCV: 95.6 fL (ref 80.0–100.0)
Platelets: 70 10*3/uL — ABNORMAL LOW (ref 150–400)
RBC: 3.15 MIL/uL — ABNORMAL LOW (ref 4.22–5.81)
RDW: 15.3 % (ref 11.5–15.5)
WBC: 6 10*3/uL (ref 4.0–10.5)
nRBC: 0 % (ref 0.0–0.2)

## 2018-07-17 LAB — GLUCOSE, CAPILLARY
Glucose-Capillary: 138 mg/dL — ABNORMAL HIGH (ref 70–99)
Glucose-Capillary: 137 mg/dL — ABNORMAL HIGH (ref 70–99)

## 2018-07-17 LAB — HEMOGLOBIN: Hemoglobin: 9.6 g/dL — ABNORMAL LOW (ref 13.0–17.0)

## 2018-07-17 LAB — TYPE AND SCREEN
ABO/RH(D): O POS
Antibody Screen: NEGATIVE

## 2018-07-17 MED ORDER — ONDANSETRON HCL 4 MG PO TABS: 4.0000 mg | ORAL_TABLET | Freq: Four times a day (QID) | ORAL | Status: DC | PRN

## 2018-07-17 MED ORDER — SODIUM CHLORIDE 0.9 % IV SOLN
1.0000 g | Freq: Once | INTRAVENOUS | Status: AC
  Administered 2018-07-17: 1 g via INTRAVENOUS
  Filled 2018-07-17: qty 10

## 2018-07-17 MED ORDER — ACETAMINOPHEN 325 MG PO TABS: 650.0000 mg | ORAL_TABLET | Freq: Four times a day (QID) | ORAL | Status: DC | PRN

## 2018-07-17 MED ORDER — INSULIN ASPART 100 UNIT/ML ~~LOC~~ SOLN
0.0000 [IU] | Freq: Three times a day (TID) | SUBCUTANEOUS | Status: DC
  Administered 2018-07-17: 1 [IU] via SUBCUTANEOUS
  Administered 2018-07-18 – 2018-07-19 (×2): 2 [IU] via SUBCUTANEOUS
  Filled 2018-07-17 (×3): qty 1

## 2018-07-17 MED ORDER — SODIUM CHLORIDE 0.9 % IV SOLN
8.0000 mg/h | INTRAVENOUS | Status: DC
  Administered 2018-07-17 – 2018-07-18 (×3): 8 mg/h via INTRAVENOUS
  Filled 2018-07-17 (×3): qty 80

## 2018-07-17 MED ORDER — INFLUENZA VAC SPLIT QUAD 0.5 ML IM SUSY
0.5000 mL | PREFILLED_SYRINGE | INTRAMUSCULAR | Status: AC
  Administered 2018-07-19: 0.5 mL via INTRAMUSCULAR
  Filled 2018-07-17: qty 0.5

## 2018-07-17 MED ORDER — SODIUM CHLORIDE 0.9 % IV SOLN
50.0000 ug/h | INTRAVENOUS | Status: DC
  Administered 2018-07-17 – 2018-07-18 (×3): 50 ug/h via INTRAVENOUS
  Filled 2018-07-17 (×6): qty 1

## 2018-07-17 MED ORDER — SODIUM CHLORIDE 0.9 % IV SOLN
80.0000 mg | Freq: Once | INTRAVENOUS | Status: AC
  Administered 2018-07-17: 80 mg via INTRAVENOUS
  Filled 2018-07-17: qty 80

## 2018-07-17 MED ORDER — ACETAMINOPHEN 650 MG RE SUPP: 650.0000 mg | Freq: Four times a day (QID) | RECTAL | Status: DC | PRN

## 2018-07-17 MED ORDER — ONDANSETRON HCL 4 MG/2ML IJ SOLN: 4.0000 mg | Freq: Four times a day (QID) | INTRAMUSCULAR | Status: DC | PRN

## 2018-07-17 NOTE — Telephone Encounter (Signed)
-----   Message from Jerrol Banana., MD sent at 07/13/2018  5:31 PM EST ----- Labs stable.

## 2018-07-17 NOTE — Telephone Encounter (Signed)
Patient advised as below.  

## 2018-07-17 NOTE — H&P (Signed)
[place] [facility] [place] NAME: Jesse Zimmerman    MR#:  [id number]  DATE OF BIRTH:  April 18, 1961  DATE OF ADMISSION:  07/17/2018  PRIMARY CARE PHYSICIAN: Jerrol Banana., MD   REQUESTING/REFERRING PHYSICIAN: dr Quentin Cornwall  CHIEF COMPLAINT:  black/dark-colored stools for two days  HISTORY OF PRESENT ILLNESS:  Jesse Zimmerman  is a 58 y.o. male with a known history of esophageal varices status post beginning in 2018, cirrhosis of liver, diabetes, chronic back pain comes to the emergency room after he noticed dark/melena stools for two days.  Reportedly had some injury on his left hand and has been taking pain medications at home with oxycodone and took ibuprofen as well. He started noticing melena not extolled two days ago came to the emergency room given his history of esophageal varices and G.I. bleeding in the past.  pt is hemodynamically stable. Hemoglobin is 10.7.  Patient currently is on Protonix and Octreotide gtt.  PAST MEDICAL HISTORY:   Past Medical History:  Diagnosis Date  . Chronic back pain   . Cirrhosis ([place])    NASH  . Diabetes mellitus without complication ([place])   . Esophageal varices ([place])   . Hyperlipidemia   . NASH (nonalcoholic steatohepatitis)     PAST SURGICAL HISTOIRY:   Past Surgical History:  Procedure Laterality Date  . COLONOSCOPY WITH PROPOFOL N/A 08/12/2017   Procedure: COLONOSCOPY WITH PROPOFOL;  Surgeon: Jonathon Bellows, MD;  Location: [facility] ENDOSCOPY;  Service: Gastroenterology;  Laterality: N/A;  . ESOPHAGOGASTRODUODENOSCOPY (EGD) WITH PROPOFOL N/A 04/18/2017   Procedure: ESOPHAGOGASTRODUODENOSCOPY (EGD) WITH PROPOFOL;  Surgeon: [name], MD;  Location: [facility] ENDOSCOPY;  Service: Endoscopy;  Laterality: N/A;  . EXCESSIVE THIGH / HIP / BUTTOCK / FLANK SKIN EXCISION     PART OF INNER THIGH/DUE TO SEPSIS INFECTION REMOVED  . KIDNEY STONE SURGERY     removed  . TONSILLECTOMY AND ADENOIDECTOMY       SOCIAL HISTORY:   Social History   Tobacco Use  . Smoking status: Never Smoker  . Smokeless tobacco: Never Used  Substance Use Topics  . Alcohol use: No    FAMILY HISTORY:   Family History  Problem Relation Age of Onset  . Hypertension Mother   . Breast cancer Mother   . Dementia Mother   . Heart attack Father   . Gout Father   . Hypertension Father   . Hypertension Sister   . Hypertension Sister   . Colon cancer Maternal Aunt   . Colon cancer Maternal Grandmother   . Colon cancer Paternal Grandmother     DRUG ALLERGIES:   Allergies  Allergen Reactions  . Latex     LATEX TAPE  . Pineapple Hives    REVIEW OF SYSTEMS:  Review of Systems  Constitutional: Negative for chills, fever and weight loss.  HENT: Negative for ear discharge, ear pain and nosebleeds.   Eyes: Negative for blurred vision, pain and discharge.  Respiratory: Negative for sputum production, shortness of breath, wheezing and stridor.   Cardiovascular: Negative for chest pain, palpitations, orthopnea and PND.  Gastrointestinal: Positive for melena. Negative for abdominal pain, diarrhea, nausea and vomiting.  Genitourinary: Negative for frequency and urgency.  Musculoskeletal: Positive for joint pain. Negative for back pain.  Neurological: Negative for sensory change, speech change, focal weakness and weakness.  Psychiatric/Behavioral: Negative for depression and hallucinations. The patient is not nervous/anxious.      MEDICATIONS AT HOME:   Prior to Admission  medications   Medication Sig Start Date End Date Taking? Authorizing Provider  ALPRAZolam Duanne Moron) 0.5 MG tablet Take 1 tablet (0.5 mg total) by mouth at bedtime as needed for anxiety. 06/07/18   Jerrol Banana., MD  doxycycline (VIBRA-TABS) 100 MG tablet Take 1 tablet (100 mg total) by mouth 2 (two) times daily. 06/30/18   Carmon Ginsberg, PA  furosemide (LASIX) 20 MG tablet 1-2 pills daily 06/08/18   Jerrol Banana., MD   hydrocortisone (ANUSOL-HC) 25 MG suppository Place 1 suppository (25 mg total) rectally at bedtime as needed for hemorrhoids or anal itching. 12/21/17   Jerrol Banana., MD  INVOKAMET XR 50-1000 MG TB24 TAKE 1 TABLET BY MOUTH  DAILY 05/02/18   Jerrol Banana., MD  ondansetron (ZOFRAN) 4 MG tablet Take 1 tablet (4 mg total) by mouth every 8 (eight) hours as needed for nausea or vomiting. 12/21/17   Jerrol Banana., MD  pantoprazole (PROTONIX) 40 MG tablet Take 1 tablet (40 mg total) by mouth 2 (two) times daily. 04/19/17   Vaughan Basta, MD  propranolol (INDERAL) 10 MG tablet Take 10 mg by mouth 2 (two) times daily.    [provider]  sildenafil (REVATIO) 20 MG tablet 2 to 4 tablets daily as needed 02/03/17   Jerrol Banana., MD  tamsulosin (FLOMAX) 0.4 MG CAPS capsule Take 1 capsule (0.4 mg total) by mouth daily. 05/11/18   Jerrol Banana., MD      VITAL SIGNS:  Blood pressure 140/64, pulse 65, temperature 97.8 F (36.6 C), temperature source Oral, resp. rate 18, height 6' (1.829 m), weight (!) 147.4 kg, SpO2 100 %.  PHYSICAL EXAMINATION:  GENERAL:  58 y.o.-year-old patient lying in the bed with no acute distress. Obese EYES: Pupils equal, round, reactive to light and accommodation. No scleral icterus. Extraocular muscles intact.  HEENT: Head atraumatic, normocephalic. Oropharynx and nasopharynx clear.  NECK:  Supple, no jugular venous distention. No thyroid enlargement, no tenderness.  LUNGS: Normal breath sounds bilaterally, no wheezing, rales,rhonchi or crepitation. No use of accessory muscles of respiration.  CARDIOVASCULAR: S1, S2 normal. No murmurs, rubs, or gallops.  ABDOMEN: Soft, nontender, nondistended. Bowel sounds present. No organomegaly or mass.  EXTREMITIES: No pedal edema, cyanosis, or clubbing.  NEUROLOGIC: Cranial nerves II through XII are intact. Muscle strength 5/5 in all extremities. Sensation intact. Gait not checked.   PSYCHIATRIC: The patient is alert and oriented x 3.  SKIN: No obvious rash, lesion, or ulcer.   LABORATORY PANEL:   CBC Recent Labs  Lab 07/17/18 1200  WBC 6.0  HGB 10.4*  HCT 30.1*  PLT 70*   ------------------------------------------------------------------------------------------------------------------  Chemistries  Recent Labs  Lab 07/17/18 1200  NA 135  K 4.1  CL 104  CO2 27  GLUCOSE 196*  BUN 20  CREATININE 0.90  CALCIUM 8.1*  AST 50*  ALT 27  ALKPHOS 128*  BILITOT 2.9*   ------------------------------------------------------------------------------------------------------------------  Cardiac Enzymes No results for input(s): TROPONINI in the last 168 hours. ------------------------------------------------------------------------------------------------------------------  RADIOLOGY:  No results found.  EKG:    IMPRESSION AND PLAN:   Jesse Zimmerman  is a 58 y.o. male with a known history of esophageal varices status post beginning in 2018, cirrhosis of liver, diabetes, chronic back pain comes to the emergency room after he noticed dark/melena stools for two days.  1. G.I. bleed/melena suspected secondary to gastric irritation from recent use of NSAIDs -hemoglobin stable. Monitor H&H Q 12. -IV Protonix  drip. Patient is on Octreotide gtt as well -discussed with G.I. Dr. Allen Norris and agrees with above plan. -Liquid diet -NPO after midnight in case patient needs endoscopy  2. Cirrhosis of liver/[place] -history of esophageal varices status post pending in October 2018. Patient had to endoscopy thereafter at [facility] relatively remain stable. -Asked endoscopy was done April 2019 -patient had colonoscopy in 2019 as well.  3. Diabetes sliding scale insulin  4. Hypertension continue home meds  5. DVT prophylaxis SCD   All the records are reviewed and case discussed with ED provider.   CODE STATUS: full  TOTAL TIME TAKING CARE OF THIS PATIENT: *50* minutes.     Fritzi Mandes M.D on 07/17/2018 at 2:37 PM  Between 7am to 6pm - Pager - [phone]  After 6pm go to [email or web address] - password [email or web address] [facility]  [facility]  Office  [phone]  CC: Primary care physician; Jerrol Banana., MD

## 2018-07-17 NOTE — ED Provider Notes (Signed)
[facility] Emergency Department Provider Note    First MD Initiated Contact with Patient 07/17/18 1351     (approximate)  I have reviewed the triage vital signs and the nursing notes.   HISTORY  Chief Complaint Rectal Bleeding    HPI Jesse Zimmerman is a 58 y.o. male history of her cirrhosis and esophageal varices previous admissions for GI bleed and endoscopies status post variceal banding presents to the ER for 2 episodes of melena today.  States he has been on quite a bit of NSAIDs recently for left hand injury.  Denies any vomiting.  No hematochezia.  Does have some mild nausea but no vomiting.    Past Medical History:  Diagnosis Date  . Chronic back pain   . Cirrhosis ([place])    NASH  . Diabetes mellitus without complication ([place])   . Esophageal varices ([place])   . Hyperlipidemia   . NASH (nonalcoholic steatohepatitis)    Family History  Problem Relation Age of Onset  . Hypertension Mother   . Breast cancer Mother   . Dementia Mother   . Heart attack Father   . Gout Father   . Hypertension Father   . Hypertension Sister   . Hypertension Sister   . Colon cancer Maternal Aunt   . Colon cancer Maternal Grandmother   . Colon cancer Paternal Grandmother    Past Surgical History:  Procedure Laterality Date  . COLONOSCOPY WITH PROPOFOL N/A 08/12/2017   Procedure: COLONOSCOPY WITH PROPOFOL;  Surgeon: Jonathon Bellows, MD;  Location[facility] ENDOSCOPY;  Service: Gastroenterology;  Laterality: N/A;  . ESOPHAGOGASTRODUODENOSCOPY (EGD) WITH PROPOFOL N/A 04/18/2017   Procedure: ESOPHAGOGASTRODUODENOSCOPY (EGD) WITH PROPOFOL;  Surgeon: [name], MD;  Location: [facility] ENDOSCOPY;  Service: Endoscopy;  Laterality: N/A;  . EXCESSIVE THIGH / HIP / BUTTOCK / FLANK SKIN EXCISION     PART OF INNER THIGH/DUE TO SEPSIS INFECTION REMOVED  . KIDNEY STONE SURGERY     removed  . TONSILLECTOMY AND ADENOIDECTOMY     Patient Active Problem List   Diagnosis Date Noted    . Blood in stool   . Secondary esophageal varices without bleeding ([place])   . Portal hypertension ([place])   . Upper GI bleed 04/17/2017  . Contusion, back 12/26/2014  . Bruising 12/26/2014  . Back pain, chronic 11/22/2014  . Diabetes mellitus type 2 in obese ([place]) 11/22/2014  . 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 Acid reflux 11/22/2014  . HLD (hyperlipidemia) 11/22/2014  . BP (high blood pressure) 11/22/2014  . 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 Cannot sleep 11/22/2014  . Cirrhosis ([place]) 11/22/2014  . Adiposity 11/22/2014  . Change in blood platelet count 11/22/2014  . Avitaminosis D 11/22/2014  . Encephalopathy, hepatic ([place]) 05/23/2012  . 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 Esophageal and gastric varices ([place]) 05/08/2012      Prior to Admission medications   Medication Sig Start Date End Date Taking? Authorizing Provider  ALPRAZolam Duanne Moron) 0.5 MG tablet Take 1 tablet (0.5 mg total) by mouth at bedtime as needed for anxiety. 06/07/18   Jerrol Banana., MD  cephALEXin (KEFLEX) 500 MG capsule Take 1 capsule (500 mg total) by mouth 4 (four) times daily. 07/05/18   Carmon Ginsberg, PA  doxycycline (VIBRA-TABS) 100 MG tablet Take 1 tablet (100 mg total) by mouth 2 (two) times daily. 06/30/18   Carmon Ginsberg, PA  doxycycline (VIBRA-TABS) 100 MG tablet Take 1 tablet (100 mg total) by mouth 2 (two) times daily. 07/11/18  Jerrol Banana., MD  fluconazole (DIFLUCAN) 150 MG tablet Take 1 tablet (150 mg total) by mouth daily. 06/07/18   Jerrol Banana., MD  fluticasone [facility]) 50 MCG/ACT nasal spray Place 2 sprays into both nostrils daily. 06/30/18   Carmon Ginsberg, PA  furosemide (LASIX) 20 MG tablet 1-2 pills daily 06/08/18   Jerrol Banana., MD  HYDROcodone-acetaminophen [facility]) 10-325 MG tablet Take 1/2 tablet by mouth every 4 hours as needed 07/11/18   Jerrol Banana., MD  hydrocortisone (ANUSOL-HC) 25 MG suppository Place 1 suppository (25 mg total) rectally at  bedtime as needed for hemorrhoids or anal itching. 12/21/17   Jerrol Banana., MD  INVOKAMET XR 50-1000 MG TB24 TAKE 1 TABLET BY MOUTH  DAILY 05/02/18   Jerrol Banana., MD  losartan (COZAAR) 25 MG tablet Take 1 tablet (25 mg total) by mouth daily. 02/03/17   Jerrol Banana., MD  metolazone (ZAROXOLYN) 2.5 MG tablet Take 1 tablet (2.5 mg total) by mouth daily. 05/11/18   Jerrol Banana., MD  ondansetron (ZOFRAN) 4 MG tablet Take 1 tablet (4 mg total) by mouth every 8 (eight) hours as needed for nausea or vomiting. 12/21/17   Jerrol Banana., MD  pantoprazole (PROTONIX) 40 MG tablet Take 1 tablet (40 mg total) by mouth 2 (two) times daily. 04/19/17   Vaughan Basta, MD  propranolol (INDERAL) 10 MG tablet Take 10 mg by mouth 2 (two) times daily.    [provider]  sildenafil (REVATIO) 20 MG tablet 2 to 4 tablets daily as needed 02/03/17   Jerrol Banana., MD  tamsulosin (FLOMAX) 0.4 MG CAPS capsule Take 1 capsule (0.4 mg total) by mouth daily. 05/11/18   Jerrol Banana., MD  Vitamin D, Ergocalciferol, (DRISDOL) 50000 units CAPS capsule Take 1 capsule (50,000 Units total) by mouth every 7 (seven) days. 07/14/17   Jerrol Banana., MD    Allergies Latex and Pineapple    Social History Social History   Tobacco Use  . Smoking status: Never Smoker  . Smokeless tobacco: Never Used  Substance Use Topics  . Alcohol use: No  . Drug use: No    Review of Systems Patient denies headaches, rhinorrhea, blurry vision, numbness, shortness of breath, chest pain, edema, cough, abdominal pain, nausea, vomiting, diarrhea, dysuria, fevers, rashes or hallucinations unless otherwise stated above in HPI. ____________________________________________   PHYSICAL EXAM:  VITAL SIGNS: Vitals:   07/17/18 1157 07/17/18 1400  BP: (!) 144/68 140/64  Pulse: 62 60  Resp: 18   Temp: 97.8 F (36.6 C)   SpO2: 99% 100%    Constitutional: Alert and  oriented.  Eyes: Conjunctivae are normal.  Head: Atraumatic. Nose: No congestion/rhinnorhea. Mouth/Throat: Mucous membranes are moist.   Neck: No stridor. Painless ROM.  Cardiovascular: Normal rate, regular rhythm. Grossly normal heart sounds.  Good peripheral circulation. Respiratory: Normal respiratory effort.  No retractions. Lungs CTAB. Gastrointestinal: Soft and nontender. No distention. No abdominal bruits. No CVA tenderness. Genitourinary: deferred Musculoskeletal: No lower extremity tenderness nor edema.  No joint effusions. Neurologic:  Normal speech and language. No gross focal neurologic deficits are appreciated. No facial droop Skin:  Skin is warm, dry and intact. No rash noted. Psychiatric: Mood and affect are normal. Speech and behavior are normal.  ____________________________________________   LABS (all labs ordered are listed, but only abnormal results are displayed)  Results for orders placed or performed during the hospital  encounter of 07/17/18 (from the past 24 hour(s))  Comprehensive metabolic panel     Status: Abnormal   Collection Time: 07/17/18 12:00 PM  Result Value Ref Range   Sodium 135 135 - 145 mmol/L   Potassium 4.1 3.5 - 5.1 mmol/L   Chloride 104 98 - 111 mmol/L   CO2 27 22 - 32 mmol/L   Glucose, Bld 196 (H) 70 - 99 mg/dL   BUN 20 6 - 20 mg/dL   Creatinine, Ser 0.90 0.61 - 1.24 mg/dL   Calcium 8.1 (L) 8.9 - 10.3 mg/dL   Total Protein 6.9 6.5 - 8.1 g/dL   Albumin 2.8 (L) 3.5 - 5.0 g/dL   AST 50 (H) 15 - 41 U/L   ALT 27 0 - 44 U/L   Alkaline Phosphatase 128 (H) 38 - 126 U/L   Total Bilirubin 2.9 (H) 0.3 - 1.2 mg/dL   GFR calc non Af Amer >60 >60 mL/min   GFR calc Af Amer >60 >60 mL/min   Anion gap 4 (L) 5 - 15  CBC     Status: Abnormal   Collection Time: 07/17/18 12:00 PM  Result Value Ref Range   WBC 6.0 4.0 - 10.5 K/uL   RBC 3.15 (L) 4.22 - 5.81 MIL/uL   Hemoglobin 10.4 (L) 13.0 - 17.0 g/dL   HCT 30.1 (L) 39.0 - 52.0 %   MCV 95.6 80.0 -  100.0 fL   MCH 33.0 26.0 - 34.0 pg   MCHC 34.6 30.0 - 36.0 g/dL   RDW 15.3 11.5 - 15.5 %   Platelets 70 (L) 150 - 400 K/uL   nRBC 0.0 0.0 - 0.2 %  Type and screen [facility]     Status: None   Collection Time: 07/17/18 12:00 PM  Result Value Ref Range   ABO/RH(D) O POS    Antibody Screen NEG    Sample Expiration      07/20/2018 Performed at [facility], [address] [place], [place] [address]    ________________________________________________  RADIOLOGY   ____________________________________________   PROCEDURES  Procedure(s) performed:  Procedures    Critical Care performed: no ____________________________________________   INITIAL IMPRESSION / ASSESSMENT AND PLAN / ED COURSE  Pertinent labs & imaging results that were available during my care of the patient were reviewed by me and considered in my medical decision making (see chart for details).   DDX: ugib, variceal bleed, gastritis, enteritis  Jesse Zimmerman is a 58 y.o. who presents to the ED with symptoms as described above.  Patient afebrile and hemodynamically stable.  Hemoglobin is at his baseline.  Does have chronic thrombocytopenia.  As the patient does have a history of cirrhosis and varices will start IV Protonix, IV octreotide as well as Rocephin.  His abdominal exam is otherwise soft and benign.  Have consulted Dr. Verl Blalock of GI.  Patient discussed with hospitalist who kindly agrees to admit patient for further medical management.      As part of my medical decision making, I reviewed the following data within the [place] notes reviewed and incorporated, Labs reviewed, notes from prior ED visits.   ____________________________________________   FINAL CLINICAL IMPRESSION(S) / ED DIAGNOSES  Final diagnoses:  Melena      NEW MEDICATIONS STARTED DURING THIS VISIT:  New Prescriptions   No medications on file     Note:  This  document was prepared using Dragon voice recognition software and may include unintentional dictation  errors.    Merlyn Lot, MD 07/17/18 [phone]

## 2018-07-17 NOTE — ED Notes (Signed)
ED TO INPATIENT HANDOFF REPORT  ED Nurse Name and Phone #: Gertie Exon [phone]  Name/Age/Gender Jesse Zimmerman 58 y.o. male Room/Bed: ED07A/ED07A  Code Status   Code Status: Prior  Home/SNF/Other Home Patient oriented to: self, place, time and situation Is this baseline? Yes   Triage Complete: Triage complete  Chief Complaint black stools  Triage Note States black stools. History GI bleed in past. Has been taking antiinflammatory for L hand pain however not frequently.    Allergies Allergies  Allergen Reactions  . Latex     LATEX TAPE  . Pineapple Hives    Level of Care/Admitting Diagnosis ED Disposition    ED Disposition Condition [facility]: [place] [[id number]]  Level of Care: Med-Surg [16]  Diagnosis: GI bleed [875643]  Admitting Physician: Odessa Fleming  Attending Physician: Odessa Fleming  Estimated length of stay: past midnight tomorrow  Certification:: I certify this patient will need inpatient services for at least 2 midnights  PT Class (Do Not Modify): Inpatient [101]  PT Acc Code (Do Not Modify): Private [1]       Medical/Surgery History Past Medical History:  Diagnosis Date  . Chronic back pain   . Cirrhosis ([place])    NASH  . Diabetes mellitus without complication ([place])   . Esophageal varices ([place])   . Hyperlipidemia   . NASH (nonalcoholic steatohepatitis)    Past Surgical History:  Procedure Laterality Date  . COLONOSCOPY WITH PROPOFOL N/A 08/12/2017   Procedure: COLONOSCOPY WITH PROPOFOL;  Surgeon: Jonathon Bellows, MD;  Location: [facility] ENDOSCOPY;  Service: Gastroenterology;  Laterality: N/A;  . ESOPHAGOGASTRODUODENOSCOPY (EGD) WITH PROPOFOL N/A 04/18/2017   Procedure: ESOPHAGOGASTRODUODENOSCOPY (EGD) WITH PROPOFOL;  Surgeon: [name], MD;  Location: [facility] ENDOSCOPY;  Service: Endoscopy;  Laterality: N/A;  . EXCESSIVE THIGH / HIP / BUTTOCK / FLANK SKIN EXCISION     PART OF INNER  THIGH/DUE TO SEPSIS INFECTION REMOVED  . KIDNEY STONE SURGERY     removed  . TONSILLECTOMY AND ADENOIDECTOMY       IV Location/Drains/Wounds Patient Lines/Drains/Airways Status   Active Line/Drains/Airways    Name:   Placement date:   Placement time:   Site:   Days:   Peripheral IV 07/17/18 Right Antecubital   07/17/18    1359    Antecubital   less than 1   Peripheral IV 07/17/18 Left Forearm   07/17/18    1418    Forearm   less than 1          Intake/Output Last 24 hours  Intake/Output Summary (Last 24 hours) at 07/17/2018 1513 Last data filed at 07/17/2018 1447 Gross per 24 hour  Intake 100 ml  Output -  Net 100 ml    Labs/Imaging Results for orders placed or performed during the hospital encounter of 07/17/18 (from the past 48 hour(s))  Comprehensive metabolic panel     Status: Abnormal   Collection Time: 07/17/18 12:00 PM  Result Value Ref Range   Sodium 135 135 - 145 mmol/L   Potassium 4.1 3.5 - 5.1 mmol/L   Chloride 104 98 - 111 mmol/L   CO2 27 22 - 32 mmol/L   Glucose, Bld 196 (H) 70 - 99 mg/dL   BUN 20 6 - 20 mg/dL   Creatinine, Ser 0.90 0.61 - 1.24 mg/dL   Calcium 8.1 (L) 8.9 - 10.3 mg/dL   Total Protein 6.9 6.5 - 8.1 g/dL   Albumin 2.8 (L) 3.5 -  5.0 g/dL   AST 50 (H) 15 - 41 U/L   ALT 27 0 - 44 U/L   Alkaline Phosphatase 128 (H) 38 - 126 U/L   Total Bilirubin 2.9 (H) 0.3 - 1.2 mg/dL   GFR calc non Af Amer >60 >60 mL/min   GFR calc Af Amer >60 >60 mL/min   Anion gap 4 (L) 5 - 15    Comment: Performed at [facility], [address] [place], [place] [address]  CBC     Status: Abnormal   Collection Time: 07/17/18 12:00 PM  Result Value Ref Range   WBC 6.0 4.0 - 10.5 K/uL   RBC 3.15 (L) 4.22 - 5.81 MIL/uL   Hemoglobin 10.4 (L) 13.0 - 17.0 g/dL   HCT 30.1 (L) 39.0 - 52.0 %   MCV 95.6 80.0 - 100.0 fL   MCH 33.0 26.0 - 34.0 pg   MCHC 34.6 30.0 - 36.0 g/dL   RDW 15.3 11.5 - 15.5 %   Platelets 70 (L) 150 - 400 K/uL    Comment: PLATELET COUNT  CONFIRMED BY SMEAR Immature Platelet Fraction may be clinically indicated, consider ordering this additional test [id number]    nRBC 0.0 0.0 - 0.2 %    Comment: Performed at [facility], [address] [place], [place] [address]  Type and screen [facility]     Status: None   Collection Time: 07/17/18 12:00 PM  Result Value Ref Range   ABO/RH(D) O POS    Antibody Screen NEG    Sample Expiration      07/20/2018 Performed at [facility], [address], [place], [place] [address]    No results found.  Pending Labs [employer] (From admission, onward)    Start     Ordered   Signed and Held  Hemoglobin  Now then every 12 hours,   R     Signed and Held          Vitals/Pain Today's Vitals   07/17/18 1158 07/17/18 1400 07/17/18 1417 07/17/18 1500  BP:  140/64 140/64 (!) 143/61  Pulse:  60 65 62  Resp:   18 16  Temp:      TempSrc:      SpO2:  100% 100% 100%  Weight: (!) 147.4 kg     Height: 6' (1.829 m)     PainSc:   6      Isolation Precautions No active isolations  Medications Medications  pantoprazole (PROTONIX) 80 mg in sodium chloride 0.9 % 100 mL IVPB (has no administration in time range)  pantoprazole (PROTONIX) 80 mg in sodium chloride 0.9 % 250 mL (0.32 mg/mL) infusion (has no administration in time range)  octreotide (SANDOSTATIN) 500 mcg in sodium chloride 0.9 % 250 mL (2 mcg/mL) infusion (has no administration in time range)  insulin aspart (novoLOG) injection 0-9 Units (has no administration in time range)  cefTRIAXone (ROCEPHIN) 1 g in sodium chloride 0.9 % 100 mL IVPB (0 g Intravenous Stopped 07/17/18 1447)    Mobility walks Low fall risk   Focused Assessments GI   Recommendations: See Admitting Provider Note  Report given to:   Additional Notes: Patient has stable GI bleed.  Alert and oriented x 4.  Wife at bedside.

## 2018-07-17 NOTE — ED Triage Notes (Signed)
States black stools. History GI bleed in past. Has been taking antiinflammatory for L hand pain however not frequently.

## 2018-07-18 ENCOUNTER — Inpatient Hospital Stay: Payer: BLUE CROSS/BLUE SHIELD | Admitting: Certified Registered"

## 2018-07-18 ENCOUNTER — Ambulatory Visit: Payer: Self-pay | Admitting: Family Medicine

## 2018-07-18 ENCOUNTER — Encounter: Payer: Self-pay | Admitting: Anesthesiology

## 2018-07-18 ENCOUNTER — Encounter
Admission: EM | Disposition: A | Discharge status: Home / Self Care | Payer: Self-pay | Source: Home / Self Care | Attending: Specialist

## 2018-07-18 DIAGNOSIS — K2900 Acute gastritis without bleeding: Secondary | ICD-10-CM

## 2018-07-18 DIAGNOSIS — K250 Acute gastric ulcer with hemorrhage: Secondary | ICD-10-CM

## 2018-07-18 DIAGNOSIS — K921 Melena: Secondary | ICD-10-CM

## 2018-07-18 HISTORY — PX: ESOPHAGOGASTRODUODENOSCOPY (EGD) WITH PROPOFOL: SHX5813

## 2018-07-18 LAB — GLUCOSE, CAPILLARY
Glucose-Capillary: 114 mg/dL — ABNORMAL HIGH (ref 70–99)
Glucose-Capillary: 182 mg/dL — ABNORMAL HIGH (ref 70–99)
Glucose-Capillary: 167 mg/dL — ABNORMAL HIGH (ref 70–99)

## 2018-07-18 LAB — HEMOGLOBIN
Hemoglobin: 9.1 g/dL — ABNORMAL LOW (ref 13.0–17.0)
Hemoglobin: 9.4 g/dL — ABNORMAL LOW (ref 13.0–17.0)

## 2018-07-18 LAB — OCCULT BLOOD X 1 CARD TO LAB, STOOL: Fecal Occult Bld: POSITIVE — AB

## 2018-07-18 SURGERY — ESOPHAGOGASTRODUODENOSCOPY (EGD) WITH PROPOFOL
Anesthesia: General

## 2018-07-18 MED ORDER — LIDOCAINE HCL (CARDIAC) PF 100 MG/5ML IV SOSY
PREFILLED_SYRINGE | INTRAVENOUS | Status: DC | PRN
  Administered 2018-07-18: 50 mg via INTRATRACHEAL

## 2018-07-18 MED ORDER — EPINEPHRINE PF 1 MG/ML IJ SOLN
INTRAMUSCULAR | Status: DC | PRN
  Administered 2018-07-18: .3 mg

## 2018-07-18 MED ORDER — PROPOFOL 10 MG/ML IV BOLUS
INTRAVENOUS | Status: DC | PRN
  Administered 2018-07-18: 60 mg via INTRAVENOUS

## 2018-07-18 MED ORDER — PROPOFOL 10 MG/ML IV BOLUS
INTRAVENOUS | Status: AC
  Filled 2018-07-18: qty 40

## 2018-07-18 MED ORDER — PROPRANOLOL HCL 20 MG PO TABS
20.0000 mg | ORAL_TABLET | Freq: Two times a day (BID) | ORAL | Status: DC
  Administered 2018-07-18 (×2): 20 mg via ORAL
  Filled 2018-07-18 (×4): qty 1

## 2018-07-18 MED ORDER — PROPOFOL 500 MG/50ML IV EMUL
INTRAVENOUS | Status: DC | PRN
  Administered 2018-07-18: 150 ug/kg/min via INTRAVENOUS

## 2018-07-18 MED ORDER — PANTOPRAZOLE SODIUM 40 MG IV SOLR
40.0000 mg | Freq: Two times a day (BID) | INTRAVENOUS | Status: DC
  Administered 2018-07-18 – 2018-07-19 (×3): 40 mg via INTRAVENOUS
  Filled 2018-07-18 (×3): qty 40

## 2018-07-18 MED ORDER — TAMSULOSIN HCL 0.4 MG PO CAPS
0.4000 mg | ORAL_CAPSULE | Freq: Every day | ORAL | Status: DC
  Administered 2018-07-18 – 2018-07-19 (×2): 0.4 mg via ORAL
  Filled 2018-07-18 (×2): qty 1

## 2018-07-18 MED ORDER — OXYCODONE HCL 5 MG PO TABS: 5.0000 mg | ORAL_TABLET | ORAL | Status: DC | PRN

## 2018-07-18 MED ORDER — FUROSEMIDE 20 MG PO TABS
20.0000 mg | ORAL_TABLET | Freq: Every day | ORAL | Status: DC
  Administered 2018-07-18: 20 mg via ORAL
  Filled 2018-07-18 (×2): qty 1

## 2018-07-18 NOTE — Anesthesia Post-op Follow-up Note (Signed)
Anesthesia QCDR form completed.        

## 2018-07-18 NOTE — Transfer of Care (Signed)
Immediate Anesthesia Transfer of Care Note  Patient: Jesse Zimmerman  Procedure(s) Performed: ESOPHAGOGASTRODUODENOSCOPY (EGD) WITH PROPOFOL (N/A )  Patient Location: Endoscopy Unit  Anesthesia Type:General  Level of Consciousness: awake, alert  and oriented  Airway & Oxygen Therapy: Patient Spontanous Breathing and Patient connected to nasal cannula oxygen  Post-op Assessment: Report given to RN and Post -op Vital signs reviewed and stable  Post vital signs: Reviewed and stable  Last Vitals:  Vitals Value Taken Time  BP    Temp    Pulse    Resp    SpO2      Last Pain:  Vitals:   07/18/18 1050  TempSrc: Tympanic  PainSc: 0-No pain         Complications: No apparent anesthesia complications

## 2018-07-18 NOTE — Progress Notes (Signed)
[place] [facility] [place] NAME: Jesse Zimmerman    MR#:  [id number]  DATE OF BIRTH:  28-Feb-1961  SUBJECTIVE:   Presents to the hospital due to melanotic stools and also rectal bleeding and suspected to have a GI bleed.  Status post endoscopy today showing a bleeding duodenal ulcer which was clipped by gastroenterology.  Hemoglobin stable.  No abdominal pain nausea or vomiting.  REVIEW OF SYSTEMS:    Review of Systems  Constitutional: Negative for chills and fever.  HENT: Negative for congestion and tinnitus.   Eyes: Negative for blurred vision and double vision.  Respiratory: Negative for cough, shortness of breath and wheezing.   Cardiovascular: Negative for chest pain, orthopnea and PND.  Gastrointestinal: Negative for abdominal pain, diarrhea, nausea and vomiting.  Genitourinary: Negative for dysuria and hematuria.  Neurological: Negative for dizziness, sensory change and focal weakness.  All other systems reviewed and are negative.   Nutrition: Clear Liquids Tolerating Diet: Yes Tolerating PT: Ambulatory  DRUG ALLERGIES:   Allergies  Allergen Reactions  . Latex     LATEX TAPE  . Pineapple Hives    VITALS:  Blood pressure (!) 141/65, pulse 67, temperature (!) 97.2 F (36.2 C), temperature source Tympanic, resp. rate 20, height 6' (1.829 m), weight (!) 147.4 kg, SpO2 99 %.  PHYSICAL EXAMINATION:   Physical Exam  GENERAL:  58 y.o.-year-old obese patient lying in bed in no acute distress.  EYES: Pupils equal, round, reactive to light and accommodation. No scleral icterus. Extraocular muscles intact.  HEENT: Head atraumatic, normocephalic. Oropharynx and nasopharynx clear.  NECK:  Supple, no jugular venous distention. No thyroid enlargement, no tenderness.  LUNGS: Normal breath sounds bilaterally, no wheezing, rales, rhonchi. No use of accessory muscles of respiration.  CARDIOVASCULAR: S1, S2 normal. No murmurs, rubs, or  gallops.  ABDOMEN: Soft, nontender, protuberant, distended. Bowel sounds present. No organomegaly or mass.  EXTREMITIES: No cyanosis, clubbing, + 2 edema b/l.   NEUROLOGIC: Cranial nerves II through XII are intact. No focal Motor or sensory deficits b/l.   PSYCHIATRIC: The patient is alert and oriented x 3.  SKIN: No obvious rash, lesion, or ulcer.    LABORATORY PANEL:   CBC Recent Labs  Lab 07/17/18 1200  07/18/18 0418  WBC 6.0  --   --   HGB 10.4*   < > 9.1*  HCT 30.1*  --   --   PLT 70*  --   --    < > = values in this interval not displayed.   ------------------------------------------------------------------------------------------------------------------  Chemistries  Recent Labs  Lab 07/17/18 1200  NA 135  K 4.1  CL 104  CO2 27  GLUCOSE 196*  BUN 20  CREATININE 0.90  CALCIUM 8.1*  AST 50*  ALT 27  ALKPHOS 128*  BILITOT 2.9*   ------------------------------------------------------------------------------------------------------------------  Cardiac Enzymes No results for input(s): TROPONINI in the last 168 hours. ------------------------------------------------------------------------------------------------------------------  RADIOLOGY:  No results found.   ASSESSMENT AND PLAN:   58 year old male with past medical history of [name] leading to liver cirrhosis, history of previous GI bleed, BPH, history of esophageal varices, diabetes who presents to the hospital due to rectal bleeding and melena.  1.  GI bleed-suspected to be an upper GI bleed given patient's melena.  Patient has a history of esophageal varices therefore was admitted and started on IV octreotide and Protonix drip.  He has had no hematemesis. -Seen by gastroenterology status post upper GI endoscopy this  morning showing gastritis with one oozing/bleeding gastric ulcer which was injected and clipped by gastroenterology. -Continue PPI twice daily, will DC octreotide and Protonix drip.  Placed  on clear liquid diet.  Further care as per gastroenterology.    2.  Acute blood loss anemia-secondary to the GI bleed as mentioned.  Hemoglobin currently stable, no acute need for transfusion.    3.  Chronic liver disease secondary to [facility] continue Lasix, propranolol, PPI twice daily. - No evidence of hepatic encephalopathy.  Continue care of GI bleed as mentioned above.    4.  BPH-continue Flomax.  5.  Diabetes type 2 without complication-hold metformin.  Continue sliding scale insulin for now.   All the records are reviewed and case discussed with Care Management/Social Worker. Management plans discussed with the patient, family and they are in agreement.  CODE STATUS: Full code  DVT Prophylaxis: Ted's & SCD's.   TOTAL TIME TAKING CARE OF THIS PATIENT: 30 minutes.   POSSIBLE D/C IN 1-2 DAYS, DEPENDING ON CLINICAL CONDITION.   Henreitta Leber M.D on 07/18/2018 at 2:48 PM  Between 7am to 6pm - Pager - [phone]  After 6pm go to [email or web address] - password [email or web address] [facility]  Office  [phone]  CC: Primary care physician; Jerrol Banana., MD

## 2018-07-18 NOTE — Op Note (Signed)
[facility] Patient Name: Jesse Zimmerman Procedure Date: 07/18/2018 11:26 AM MRN: [id number] Account #: [id number] Date of Birth: 1961/03/15 Admit Type: Inpatient Age: 58 Room: [facility] ENDO ROOM 4 Gender: Male Note Status: Finalized Procedure:            Upper GI endoscopy Indications:          Melena Providers:            [name] MD, MD Referring MD:         Janine Ores. Rosanna Randy, MD (Referring MD) Medicines:            Propofol per Anesthesia Complications:        No immediate complications. Procedure:            Pre-Anesthesia Assessment:                       - Prior to the procedure, a History and Physical was                        performed, and patient medications and allergies were                        reviewed. The patient's tolerance of previous                        anesthesia was also reviewed. The risks and benefits of                        the procedure and the sedation options and risks were                        discussed with the patient. All questions were                        answered, and informed consent was obtained. Prior                        Anticoagulants: The patient has taken no previous                        anticoagulant or antiplatelet agents. ASA Grade                        Assessment: II - A patient with mild systemic disease.                        After reviewing the risks and benefits, the patient was                        deemed in satisfactory condition to undergo the                        procedure.                       After obtaining informed consent, the endoscope was                        passed under direct vision. Throughout the procedure,  the patient's blood pressure, pulse, and oxygen                        saturations were monitored continuously. The Endoscope                        was introduced through the mouth, and advanced to the   second part of duodenum. The upper GI endoscopy was                        accomplished without difficulty. The patient tolerated                        the procedure well. Findings:      The examined esophagus was normal.      One oozing cratered gastric ulcer with a visible vessel was found in the       gastric antrum. Area was successfully injected with 3 mL of a 1:10,000       solution of epinephrine for hemostasis. To stop active bleeding, three       hemostatic clips were successfully placed (MR conditional). There was no       bleeding at the end of the procedure.      Localized moderate inflammation was found in the gastric antrum.      The examined duodenum was normal. Impression:           - Normal esophagus.                       - Oozing gastric ulcer with a visible vessel. Injected.                        Clips (MR conditional) were placed.                       - Gastritis.                       - Normal examined duodenum.                       - No specimens collected. Recommendation:       - Return patient to hospital ward for ongoing care.                       - Clear liquid diet.                       - Continue present medications.                       - Use a proton pump inhibitor PO daily for 8 weeks. Procedure Code(s):    --- Professional ---                       [phone], Esophagogastroduodenoscopy, flexible, transoral;                        with control of bleeding, any method Diagnosis Code(s):    --- Professional ---                       K92.1, Melena (includes Hematochezia)  K29.70, Gastritis, unspecified, without bleeding                       K25.4, Chronic or unspecified gastric ulcer with                        hemorrhage CPT copyright 2018 American Medical Association. All rights reserved. The codes documented in this report are preliminary and upon coder review may  be revised to meet current compliance requirements. [name]  MD, MD 07/18/2018 11:50:13 AM This report has been signed electronically. Number of Addenda: 0 Note Initiated On: 07/18/2018 11:26 AM      [facility]

## 2018-07-18 NOTE — Anesthesia Postprocedure Evaluation (Signed)
Anesthesia Post Note  Patient: Jesse Zimmerman  Procedure(s) Performed: ESOPHAGOGASTRODUODENOSCOPY (EGD) WITH PROPOFOL (N/A )  Patient location during evaluation: Endoscopy Anesthesia Type: General Level of consciousness: awake and alert Pain management: pain level controlled Vital Signs Assessment: post-procedure vital signs reviewed and stable Respiratory status: spontaneous breathing, nonlabored ventilation, respiratory function stable and patient connected to nasal cannula oxygen Cardiovascular status: blood pressure returned to baseline and stable Postop Assessment: no apparent nausea or vomiting Anesthetic complications: no     Last Vitals:  Vitals:   07/18/18 1223 07/18/18 1246  BP: (!) 143/61 (!) 141/65  Pulse: 66 67  Resp: 12 20  Temp:    SpO2: 98% 99%    Last Pain:  Vitals:   07/18/18 1223  TempSrc:   PainSc: 0-No pain                 Martha Clan

## 2018-07-18 NOTE — Anesthesia Preprocedure Evaluation (Addendum)
Anesthesia Evaluation  Patient identified by MRN, date of birth, ID band Patient awake    Reviewed: Allergy & Precautions, H&P , NPO status , Patient's Chart, lab work & pertinent test results, reviewed documented beta blocker date and time   History of Anesthesia Complications Negative for: history of anesthetic complications  Airway Mallampati: II   Neck ROM: full    Dental  (+) Poor Dentition, Dental Advidsory Given   Pulmonary neg pulmonary ROS,           Cardiovascular Exercise Tolerance: Poor hypertension, On Medications (-) angina(-) CAD, (-) Past MI, (-) Cardiac Stents and (-) CABG (-) dysrhythmias (-) Valvular Problems/Murmurs     Neuro/Psych negative neurological ROS  negative psych ROS   GI/Hepatic negative GI ROS, Neg liver ROS, GERD  Medicated,(+) Hepatitis -  Endo/Other  diabetesMorbid obesity  Renal/GU negative Renal ROS  negative genitourinary   Musculoskeletal   Abdominal   Peds  Hematology negative hematology ROS (+)   Anesthesia Other Findings Past Medical History: No date: Chronic back pain No date: Cirrhosis (HCC)     Comment:  NASH No date: Diabetes mellitus without complication (HCC) No date: Esophageal varices (HCC) No date: Hyperlipidemia No date: NASH (nonalcoholic steatohepatitis) Past Surgical History: 04/18/2017: ESOPHAGOGASTRODUODENOSCOPY (EGD) WITH PROPOFOL; N/A     Comment:  Procedure: ESOPHAGOGASTRODUODENOSCOPY (EGD) WITH               PROPOFOL;  Surgeon: [name], MD;  Location: [facility]               ENDOSCOPY;  Service: Endoscopy;  Laterality: N/A; No date: EXCESSIVE THIGH / HIP / BUTTOCK / FLANK SKIN EXCISION     Comment:  PART OF INNER THIGH/DUE TO SEPSIS INFECTION REMOVED No date: KIDNEY STONE SURGERY     Comment:  removed No date: TONSILLECTOMY AND ADENOIDECTOMY   Reproductive/Obstetrics negative OB ROS                            Anesthesia  Physical  Anesthesia Plan  ASA: III  Anesthesia Plan: General   Post-op Pain Management:    Induction: Intravenous  PONV Risk Score and Plan: 2 and Propofol infusion and TIVA  Airway Management Planned: Nasal Cannula and Natural Airway  Additional Equipment:   Intra-op Plan:   Post-operative Plan:   Informed Consent: I have reviewed the patients History and Physical, chart, labs and discussed the procedure including the risks, benefits and alternatives for the proposed anesthesia with the patient or authorized representative who has indicated his/her understanding and acceptance.     Dental Advisory Given  Plan Discussed with: CRNA  Anesthesia Plan Comments:        Anesthesia Quick Evaluation

## 2018-07-18 NOTE — Consult Note (Signed)
Jesse Lame, MD Jesse Zimmerman  [address] [place], [place] [address] Phone: [phone] Fax : [phone]  Consultation  Referring Provider:     Dr. Posey Pronto Primary Care Physician:  Jerrol Banana., MD Primary Gastroenterologist:  Dr. Allen Norris         Reason for Consultation:     Melena  Date of Admission:  07/17/2018 Date of Consultation:  07/18/2018         HPI:   Jesse Zimmerman is a 58 y.o. male who has a history of cirrhosis and esophageal banding by me back in 2018.  The patient had reported some injury to his left hand with some swelling and cellulitis.  The patient was treated with 2 rounds of antibiotics and the swelling did not improve after the first round of antibiotics but did after the second.  He states that the hand started to have a lot of pain he was taking anti-inflammatory medications.  The patient started to have black stools and was admitted with a hemoglobin of 10.4.  This was similar to his 10.7 from a week ago.  Overnight the patient's hemoglobin went down to 9.1.  He reported only a small amount of residual black stools overnight.  He denies any abdominal pain hematemesis fevers or chills.  The patient is followed by [facility] hepatology for his cirrhosis.  He had a colonoscopy by Dr. Vicente Males in February 2019. I am now being consulted for an upper GI bleed with melena and a slight drop in hemoglobin in the setting of NSAID use.  Past Medical History:  Diagnosis Date  . Chronic back pain   . Cirrhosis ([place])    NASH  . Diabetes mellitus without complication ([place])   . Esophageal varices ([place])   . Hyperlipidemia   . NASH (nonalcoholic steatohepatitis)     Past Surgical History:  Procedure Laterality Date  . COLONOSCOPY WITH PROPOFOL N/A 08/12/2017   Procedure: COLONOSCOPY WITH PROPOFOL;  Surgeon: Jonathon Bellows, MD;  Location: [facility] ENDOSCOPY;  Service: Gastroenterology;  Laterality: N/A;  . ESOPHAGOGASTRODUODENOSCOPY (EGD) WITH PROPOFOL N/A 04/18/2017   Procedure: ESOPHAGOGASTRODUODENOSCOPY (EGD) WITH PROPOFOL;  Surgeon: Jesse Lame, MD;  Location: [facility] ENDOSCOPY;  Service: Endoscopy;  Laterality: N/A;  . EXCESSIVE THIGH / HIP / BUTTOCK / FLANK SKIN EXCISION     PART OF INNER THIGH/DUE TO SEPSIS INFECTION REMOVED  . KIDNEY STONE SURGERY     removed  . TONSILLECTOMY AND ADENOIDECTOMY      Prior to Admission medications   Medication Sig Start Date End Date Taking? Authorizing Provider  acetaminophen (TYLENOL) 325 MG tablet Take 650 mg by mouth every 6 (six) hours as needed for mild pain or fever.   Yes [provider]  doxycycline (VIBRA-TABS) 100 MG tablet Take 1 tablet (100 mg total) by mouth 2 (two) times daily. 06/30/18  Yes Carmon Ginsberg, PA  furosemide (LASIX) 20 MG tablet 1-2 pills daily Patient taking differently: Take 20 mg by mouth daily. 1-2 pills daily 06/08/18  Yes Jerrol Banana., MD  hydrocortisone (ANUSOL-HC) 25 MG suppository Place 1 suppository (25 mg total) rectally at bedtime as needed for hemorrhoids or anal itching. 12/21/17  Yes Jerrol Banana., MD  ibuprofen (ADVIL,MOTRIN) 200 MG tablet Take 200 mg by mouth every 6 (six) hours as needed for moderate pain.   Yes [provider]  INVOKAMET XR 50-1000 MG TB24 TAKE 1 TABLET BY MOUTH  DAILY 05/02/18  Yes Jerrol Banana., MD  ondansetron (ZOFRAN) 4 MG tablet Take 1 tablet (4 mg total) by mouth every 8 (eight) hours as needed for nausea or vomiting. 12/21/17  Yes Jerrol Banana., MD  oxyCODONE (OXY IR/ROXICODONE) 5 MG immediate release tablet Take 5-10 mg by mouth every 3 (three) hours as needed for severe pain.   Yes [provider]  pantoprazole (PROTONIX) 40 MG tablet Take 1 tablet (40 mg total) by mouth 2 (two) times daily. 04/19/17  Yes Vaughan Basta, MD  propranolol (INDERAL) 20 MG tablet Take 20 mg by mouth 2 (two) times daily.    Yes [provider]  sildenafil (REVATIO) 20 MG tablet 2 to 4 tablets  daily as needed 02/03/17  Yes Jerrol Banana., MD  tamsulosin [facility]) 0.4 MG CAPS capsule Take 1 capsule (0.4 mg total) by mouth daily. 05/11/18  Yes Jerrol Banana., MD  ALPRAZolam Duanne Moron) 0.5 MG tablet Take 1 tablet (0.5 mg total) by mouth at bedtime as needed for anxiety. Patient not taking: Reported on 07/17/2018 06/07/18   Jerrol Banana., MD    Family History  Problem Relation Age of Onset  . Hypertension Mother   . Breast cancer Mother   . Dementia Mother   . Heart attack Father   . Gout Father   . Hypertension Father   . Hypertension Sister   . Hypertension Sister   . Colon cancer Maternal Aunt   . Colon cancer Maternal Grandmother   . Colon cancer Paternal Grandmother      Social History   Tobacco Use  . Smoking status: Never Smoker  . Smokeless tobacco: Never Used  Substance Use Topics  . Alcohol use: No  . Drug use: No    Allergies as of 07/17/2018 - Review Complete 07/17/2018  Allergen Reaction Noted  . Latex  11/22/2014  . Pineapple Hives 11/22/2014    Review of Systems:    All systems reviewed and negative except where noted in HPI.   Physical Exam:  Vital signs in last 24 hours: Temp:  [97.2 F (36.2 C)-98.4 F (36.9 C)] 97.2 F (36.2 C) (01/28 1153) Pulse Rate:  [61-69] 67 (01/28 1246) Resp:  [12-21] 20 (01/28 1246) BP: (115-157)/(52-109) 141/65 (01/28 1246) SpO2:  [96 %-100 %] 99 % (01/28 1246) Last BM Date: 07/17/18 General:   Pleasant, cooperative in NAD Head:  Normocephalic and atraumatic. Eyes:   No icterus.   Conjunctiva pink. PERRLA. Ears:  Normal auditory acuity. Neck:  Supple; no masses or thyroidomegaly Lungs: Respirations even and unlabored. Lungs clear to auscultation bilaterally.   No wheezes, crackles, or rhonchi.  Heart:  Regular rate and rhythm;  Without murmur, clicks, rubs or gallops Abdomen:  Soft, nondistended, nontender. Normal bowel sounds. No appreciable masses or hepatomegaly.  No rebound or  guarding.  Rectal:  Not performed. Msk:  Symmetrical without gross deformities.    Extremities:  Without edema, cyanosis or clubbing. Neurologic:  Alert and oriented x3;  grossly normal neurologically. Skin:  Intact without significant lesions or rashes. Cervical Nodes:  No significant cervical adenopathy. Psych:  Alert and cooperative. Normal affect.  LAB RESULTS: Recent Labs    07/17/18 1200 07/17/18 1656 07/18/18 0418  WBC 6.0  --   --   HGB 10.4* 9.6* 9.1*  HCT 30.1*  --   --   PLT 70*  --   --    BMET Recent Labs    07/17/18 1200  NA 135  K 4.1  CL 104  CO2 27  GLUCOSE 196*  BUN 20  CREATININE 0.90  CALCIUM 8.1*   LFT Recent Labs    07/17/18 1200  PROT 6.9  ALBUMIN 2.8*  AST 50*  ALT 27  ALKPHOS 128*  BILITOT 2.9*   PT/INR No results for input(s): LABPROT, INR in the last 72 hours.  STUDIES: No results found.    Impression / Plan:   Assessment: Active Problems:   GI bleed   Melena   Acute gastric ulcer with hemorrhage   Acute gastritis without hemorrhage   WILMON CONOVER is a 58 y.o. y/o male with who comes in with melena without abdominal pain and a slight drop in hemoglobin.  The patient had been on NSAIDs for a swollen left hand.  He has not had any sign of any continued bleeding overnight with any black stools or bloody stools.  Plan: The patient likely has NSAID induced gastritis versus peptic ulcer disease.  It is unlikely that the patient has bleeding from his varices since he has had ablation of his varices in the past with banding.  The patient will be brought down this morning for an EGD with possible control of any GI bleeding. I have discussed risks & benefits which include, but are not limited to, bleeding, infection, perforation & drug reaction.  The patient agrees with this plan & written consent will be obtained.     Thank you for involving me in the care of this patient.      LOS: 1 day   Jesse Lame, MD  07/18/2018, 2:00  PM    Note: This dictation was prepared with Dragon dictation along with smaller phrase technology. Any transcriptional errors that result from this process are unintentional.

## 2018-07-19 ENCOUNTER — Encounter: Payer: Self-pay | Admitting: Gastroenterology

## 2018-07-19 LAB — CBC
HCT: 25.5 % — ABNORMAL LOW (ref 39.0–52.0)
Hemoglobin: 8.7 g/dL — ABNORMAL LOW (ref 13.0–17.0)
MCH: 33 pg (ref 26.0–34.0)
MCHC: 34.1 g/dL (ref 30.0–36.0)
MCV: 96.6 fL (ref 80.0–100.0)
Platelets: 51 10*3/uL — ABNORMAL LOW (ref 150–400)
RBC: 2.64 MIL/uL — ABNORMAL LOW (ref 4.22–5.81)
RDW: 15.2 % (ref 11.5–15.5)
WBC: 4.9 10*3/uL (ref 4.0–10.5)
nRBC: 0 % (ref 0.0–0.2)

## 2018-07-19 LAB — GLUCOSE, CAPILLARY
Glucose-Capillary: 114 mg/dL — ABNORMAL HIGH (ref 70–99)
Glucose-Capillary: 169 mg/dL — ABNORMAL HIGH (ref 70–99)

## 2018-07-19 NOTE — Progress Notes (Signed)
Discharge instructions reviewed with the patient.  Patient sent out via wheelchair with belongings

## 2018-07-19 NOTE — Discharge Summary (Addendum)
[place] [facility] [place] NAME: Jesse Zimmerman    MR#:  [id number]  DATE OF BIRTH:  11-Oct-1960  DATE OF ADMISSION:  07/17/2018 ADMITTING PHYSICIAN: Fritzi Mandes, MD  DATE OF DISCHARGE: 07/19/2018  PRIMARY CARE PHYSICIAN: Jerrol Banana., MD    ADMISSION DIAGNOSIS:  Melena [K92.1]  DISCHARGE DIAGNOSIS:  Active Problems:   GI bleed   Melena   Acute gastric ulcer with hemorrhage   Acute gastritis without hemorrhage   SECONDARY DIAGNOSIS:   Past Medical History:  Diagnosis Date  . Chronic back pain   . Cirrhosis ([place])    NASH  . Diabetes mellitus without complication ([place])   . Esophageal varices ([place])   . Hyperlipidemia   . NASH (nonalcoholic steatohepatitis)     HOSPITAL COURSE:   58 year old male with past medical history of Jesse Zimmerman leading to liver cirrhosis, history of previous GI bleed, BPH, history of esophageal varices, diabetes who presents to the hospital due to rectal bleeding and melena.  1.  GI bleed- this was an upper GI bleed given patient's melena.  Patient had a history of esophageal varices therefore was admitted and started on IV octreotide and Protonix drip.  He although had no hematemesis. -Seen by gastroenterology status post upper GI endoscopy yesterday showing gastritis with one oozing/bleeding gastric ulcer which was injected and clipped by gastroenterology. -Octreotide and Protonix drips were stopped post endoscopy.  Patient was maintained on IV PPI twice daily.  Diet has slowly been advanced from a clear to a regular diet which he is tolerating without any further evidence of bleeding.  Hemoglobin is stable.  Patient is therefore being discharged home with outpatient follow-up with gastroenterology.  2.  Acute blood loss anemia-secondary to the GI bleed as mentioned.   - His hemoglobin on admission was 10.4.  It drifted down to as low as 8.7.  Patient did not require any transfusions.  Hemoglobin can be further  followed as outpatient.  3.  Chronic liver disease secondary to [facility]- pt. Will continue Lasix, propranolol, PPI twice daily. - No evidence of hepatic encephalopathy.    4.  BPH-continue Flomax. - no urinary retention while in the hospital.   5.  Diabetes type 2 without complication- while in the hospital pt. Was on SSI.  - pt. Will resume their Invokamet-XR.   DISCHARGE CONDITIONS:   Stable.   CONSULTS OBTAINED:  Treatment Team:  [name], MD  DRUG ALLERGIES:   Allergies  Allergen Reactions  . Latex     LATEX TAPE  . Pineapple Hives    DISCHARGE MEDICATIONS:   Allergies as of 07/19/2018      Reactions   Latex    LATEX TAPE   Pineapple Hives      Medication List    STOP taking these medications   doxycycline 100 MG tablet Commonly known as:  VIBRA-TABS     TAKE these medications   acetaminophen 325 MG tablet Commonly known as:  TYLENOL Take 650 mg by mouth every 6 (six) hours as needed for mild pain or fever.   ALPRAZolam 0.5 MG tablet Commonly known as:  XANAX Take 1 tablet (0.5 mg total) by mouth at bedtime as needed for anxiety.   furosemide 20 MG tablet Commonly known as:  LASIX 1-2 pills daily What changed:    how much to take  how to take this  when to take this   hydrocortisone 25 MG suppository Commonly known as:  ANUSOL-HC  Place 1 suppository (25 mg total) rectally at bedtime as needed for hemorrhoids or anal itching.   ibuprofen 200 MG tablet Commonly known as:  ADVIL,MOTRIN Take 200 mg by mouth every 6 (six) hours as needed for moderate pain.   INVOKAMET XR 50-1000 MG Tb24 Generic drug:  Canagliflozin-metFORMIN HCl ER TAKE 1 TABLET BY MOUTH  DAILY   ondansetron 4 MG tablet Commonly known as:  ZOFRAN Take 1 tablet (4 mg total) by mouth every 8 (eight) hours as needed for nausea or vomiting.   oxyCODONE 5 MG immediate release tablet Commonly known as:  Oxy IR/ROXICODONE Take 5-10 mg by mouth every 3 (three) hours as  needed for severe pain.   pantoprazole 40 MG tablet Commonly known as:  PROTONIX Take 1 tablet (40 mg total) by mouth 2 (two) times daily.   propranolol 20 MG tablet Commonly known as:  INDERAL Take 20 mg by mouth 2 (two) times daily.   sildenafil 20 MG tablet Commonly known as:  REVATIO 2 to 4 tablets daily as needed   tamsulosin 0.4 MG Caps capsule Commonly known as:  FLOMAX Take 1 capsule (0.4 mg total) by mouth daily.         DISCHARGE INSTRUCTIONS:   DIET:  Cardiac diet and Diabetic diet  DISCHARGE CONDITION:  Stable  ACTIVITY:  Activity as tolerated  OXYGEN:  Home Oxygen: No.   Oxygen Delivery: room air  DISCHARGE LOCATION:  home   If you experience worsening of your admission symptoms, develop shortness of breath, life threatening emergency, suicidal or homicidal thoughts you must seek medical attention immediately by calling 911 or calling your MD immediately  if symptoms less severe.  You Must read complete instructions/literature along with all the possible adverse reactions/side effects for all the Medicines you take and that have been prescribed to you. Take any new Medicines after you have completely understood and accpet all the possible adverse reactions/side effects.   Please note  You were cared for by a hospitalist during your hospital stay. If you have any questions about your discharge medications or the care you received while you were in the hospital after you are discharged, you can call the unit and asked to speak with the hospitalist on call if the hospitalist that took care of you is not available. Once you are discharged, your primary care physician will handle any further medical issues. Please note that NO REFILLS for any discharge medications will be authorized once you are discharged, as it is imperative that you return to your primary care physician (or establish a relationship with a primary care physician if you do not have one) for  your aftercare needs so that they can reassess your need for medications and monitor your lab values.     Today   No acute events overnight. NO acute bleeding overnight. Hg. Stable.    VITAL SIGNS:  Blood pressure (!) 121/58, pulse 60, temperature 98.1 F (36.7 C), temperature source Oral, resp. rate 18, height 6' (1.829 m), weight (!) 147.4 kg, SpO2 97 %.  I/O:    Intake/Output Summary (Last 24 hours) at 07/19/2018 1343 Last data filed at 07/19/2018 0436 Gross per 24 hour  Intake 0 ml  Output -  Net 0 ml    PHYSICAL EXAMINATION:   GENERAL:  58 y.o.-year-old obese patient lying in bed in no acute distress.  EYES: Pupils equal, round, reactive to light and accommodation. No scleral icterus. Extraocular muscles intact.  HEENT: Head atraumatic, normocephalic. Oropharynx  and nasopharynx clear.  NECK:  Supple, no jugular venous distention. No thyroid enlargement, no tenderness.  LUNGS: Normal breath sounds bilaterally, no wheezing, rales, rhonchi. No use of accessory muscles of respiration.  CARDIOVASCULAR: S1, S2 normal. No murmurs, rubs, or gallops.  ABDOMEN: Soft, nontender, protuberant, distended. Bowel sounds present. No organomegaly or mass.  EXTREMITIES: No cyanosis, clubbing, + 2 edema b/l.   NEUROLOGIC: Cranial nerves II through XII are intact. No focal Motor or sensory deficits b/l.   PSYCHIATRIC: The patient is alert and oriented x 3.  SKIN: No obvious rash, lesion, or ulcer.   DATA REVIEW:   CBC Recent Labs  Lab 07/19/18 0338  WBC 4.9  HGB 8.7*  HCT 25.5*  PLT 51*    Chemistries  Recent Labs  Lab 07/17/18 1200  NA 135  K 4.1  CL 104  CO2 27  GLUCOSE 196*  BUN 20  CREATININE 0.90  CALCIUM 8.1*  AST 50*  ALT 27  ALKPHOS 128*  BILITOT 2.9*    Cardiac Enzymes No results for input(s): TROPONINI in the last 168 hours.   RADIOLOGY:  No results found.    Management plans discussed with the patient, family and they are in agreement.  CODE  STATUS:     Code Status Orders  (From admission, onward)         Start     Ordered   07/17/18 1553  Full code  Continuous     07/17/18 1552        TOTAL TIME TAKING CARE OF THIS PATIENT: 40 minutes.    Henreitta Leber M.D on 07/19/2018 at 1:43 PM  Between 7am to 6pm - Pager - [phone]  After 6pm go to [email or web address] - password [email or web address] [facility] [facility]  Office  [phone]  CC: Primary care physician; Jerrol Banana., MD

## 2018-07-20 ENCOUNTER — Telehealth: Payer: Self-pay

## 2018-07-20 NOTE — Telephone Encounter (Signed)
I have made the 1st attempt to contact the patient or family member in charge, in order to follow up from recently being discharged from the hospital. I left a message on voicemail requesting a CB. -MM 

## 2018-07-20 NOTE — Telephone Encounter (Signed)
Transition Care Management Follow-up Telephone Call  Date of discharge and from where: [facility] on 07/19/18.  How have you been since you were released from the hospital? Left hand is still bothering him. The swelling has gone down. Pt pt did not complete the doxycycline course per hospital request. Pain level is about a 5-6 with no movement and a 7-8 with movement. Stool is more normal today. It is more formed and brown in color. Pt is still very weak. Pt had fluid build up from waist down but pt is taking a fluid pill to help. Pt has lost weight (10lbs). Declines bleeding, fever or n/v.  Any questions or concerns? Yes, where did the fluid come from?  Items Reviewed:  Did the pt receive and understand the discharge instructions provided? Yes   Medications obtained and verified? Yes   Any new allergies since your discharge? No   Dietary orders reviewed? Yes  Do you have support at home? Yes   Other (ie: DME, Home Health, etc) N/A  Functional Questionnaire: (I = Independent and D = Dependent)  Bathing/Dressing- I   Meal Prep- I  Eating- I  Maintaining continence- I  Transferring/Ambulation- I  Managing Meds- I   Follow up appointments reviewed:    PCP Hospital f/u appt confirmed? Yes  Scheduled to see Dr. Rosanna Randy on 07/25/18 @ 1:20 PM.  [place] [facility] f/u appt confirmed? N/A  Are transportation arrangements needed? No   If their condition worsens, is the pt aware to call  their PCP or go to the ED? No  Was the patient provided with contact information for the PCP's office or ED? Yes  Was the pt encouraged to call back with questions or concerns? Yes

## 2018-07-20 NOTE — Telephone Encounter (Signed)
Pt returned missed call.  Call him back on his cell at [phone].  Thanks, [employer]

## 2018-07-25 ENCOUNTER — Encounter: Payer: Self-pay | Admitting: Family Medicine

## 2018-07-25 ENCOUNTER — Ambulatory Visit: Payer: BLUE CROSS/BLUE SHIELD | Admitting: Family Medicine

## 2018-07-25 ENCOUNTER — Other Ambulatory Visit: Payer: Self-pay | Admitting: Family Medicine

## 2018-07-25 VITALS — BP 88/38 | HR 66 | Temp 97.8°F | Wt 317.8 lb

## 2018-07-25 DIAGNOSIS — I8500 Esophageal varices without bleeding: Secondary | ICD-10-CM

## 2018-07-25 DIAGNOSIS — R001 Bradycardia, unspecified: Secondary | ICD-10-CM | POA: Diagnosis not present

## 2018-07-25 DIAGNOSIS — I864 Gastric varices: Secondary | ICD-10-CM

## 2018-07-25 DIAGNOSIS — K922 Gastrointestinal hemorrhage, unspecified: Secondary | ICD-10-CM | POA: Diagnosis not present

## 2018-07-25 DIAGNOSIS — K7469 Other cirrhosis of liver: Secondary | ICD-10-CM

## 2018-07-25 DIAGNOSIS — Z6841 Body Mass Index (BMI) 40.0 and over, adult: Secondary | ICD-10-CM

## 2018-07-25 DIAGNOSIS — E669 Obesity, unspecified: Secondary | ICD-10-CM

## 2018-07-25 DIAGNOSIS — I959 Hypotension, unspecified: Secondary | ICD-10-CM

## 2018-07-25 DIAGNOSIS — E1169 Type 2 diabetes mellitus with other specified complication: Secondary | ICD-10-CM

## 2018-07-25 NOTE — Progress Notes (Signed)
Patient: Jesse Zimmerman Male    DOB: 1961/04/28   58 y.o.   MRN: [id number] Visit Date: 07/25/2018  Today's Provider: Wilhemena Durie, MD   Chief Complaint  Patient presents with  . Hospitalization Follow-up   Subjective:    HPI  Follow up Hospitalization  Patient was admitted to [facility] on 07/17/2018 and discharged on 07/19/2018. He was treated for bleeding duodenal ulcer which got clipped by gastroenterology.. Treatment for this included surgery. Telephone follow up was done on 07/20/2018 by McK[place]. He reports good compliance with treatment. He reports this condition is Improved. Patient states he is experiencing hypotension since surgery. BP readings are ranging between 100-110/50-60's.  ------------------------------------------------------------------------------------ He is significantly orthostatic today but but is completely asymptomatic.  He feels a little weak overall.  Allergies  Allergen Reactions  . Latex     LATEX TAPE  . Pineapple Hives     Current Outpatient Medications:  .  acetaminophen (TYLENOL) 325 MG tablet, Take 650 mg by mouth every 6 (six) hours as needed for mild pain or fever., Disp: , Rfl:  .  furosemide (LASIX) 20 MG tablet, 1-2 pills daily (Patient taking differently: Take 20 mg by mouth daily. 1-2 pills daily), Disp: 180 tablet, Rfl: 1 .  hydrocortisone (ANUSOL-HC) 25 MG suppository, Place 1 suppository (25 mg total) rectally at bedtime as needed for hemorrhoids or anal itching., Disp: 12 suppository, Rfl: 5 .  ibuprofen (ADVIL,MOTRIN) 200 MG tablet, Take 200 mg by mouth every 6 (six) hours as needed for moderate pain., Disp: , Rfl:  .  INVOKAMET XR 50-1000 MG TB24, TAKE 1 TABLET BY MOUTH  DAILY, Disp: 120 tablet, Rfl: 3 .  ondansetron (ZOFRAN) 4 MG tablet, Take 1 tablet (4 mg total) by mouth every 8 (eight) hours as needed for nausea or vomiting., Disp: 45 tablet, Rfl: 1 .  oxyCODONE (OXY IR/ROXICODONE) 5 MG immediate release tablet,  Take 5-10 mg by mouth every 3 (three) hours as needed for severe pain., Disp: , Rfl:  .  pantoprazole (PROTONIX) 40 MG tablet, Take 1 tablet (40 mg total) by mouth 2 (two) times daily., Disp: 60 tablet, Rfl: 0 .  propranolol (INDERAL) 20 MG tablet, Take 20 mg by mouth 2 (two) times daily. , Disp: , Rfl:  .  sildenafil (REVATIO) 20 MG tablet, 2 to 4 tablets daily as needed, Disp: 100 tablet, Rfl: 5 .  tamsulosin (FLOMAX) 0.4 MG CAPS capsule, Take 1 capsule (0.4 mg total) by mouth daily., Disp: 90 capsule, Rfl: 3 .  ALPRAZolam (XANAX) 0.5 MG tablet, Take 1 tablet (0.5 mg total) by mouth at bedtime as needed for anxiety. (Patient not taking: Reported on 07/17/2018), Disp: 30 tablet, Rfl: 1  Review of Systems  Constitutional: Negative.   HENT: Negative.   Eyes: Negative.   Respiratory: Negative.   Cardiovascular: Negative.   Endocrine: Negative.   Genitourinary: Negative.   Allergic/Immunologic: Negative.   Neurological: Negative.   Psychiatric/Behavioral: Negative.     Social History   Tobacco Use  . Smoking status: Never Smoker  . Smokeless tobacco: Never Used  Substance Use Topics  . Alcohol use: No      Objective:   BP (!) 88/38 (BP Location: Right Arm, Patient Position: Sitting, Cuff Size: Large)   Pulse (!) 32   Temp 97.8 F (36.6 C) (Oral)   Wt (!) 317 lb 12.8 oz (144.2 kg)   SpO2 99%   BMI 43.10 kg/m  Vitals:   07/25/18  1328  BP: (!) 88/38  Pulse: (!) 32  Temp: 97.8 F (36.6 C)  TempSrc: Oral  SpO2: 99%  Weight: (!) 317 lb 12.8 oz (144.2 kg)     Physical Exam Constitutional:      Appearance: Normal appearance. He is obese.  HENT:     Head: Normocephalic and atraumatic.     Right Ear: External ear normal.     Left Ear: External ear normal.     Nose: Nose normal.     Mouth/Throat:     Pharynx: Oropharynx is clear.  Eyes:     General: No scleral icterus.    Conjunctiva/sclera: Conjunctivae normal.  Cardiovascular:     Rate and Rhythm: Normal rate and  regular rhythm.     Pulses: Normal pulses.     Heart sounds: Normal heart sounds.  Pulmonary:     Effort: Pulmonary effort is normal.  Abdominal:     Palpations: Abdomen is soft.  Musculoskeletal:     Comments: Hand back to normal.  Skin:    General: Skin is warm and dry.  Neurological:     General: No focal deficit present.     Mental Status: He is alert and oriented to person, place, and time. Mental status is at baseline.  Psychiatric:        Mood and Affect: Mood normal.        Behavior: Behavior normal.        Thought Content: Thought content normal.        Judgment: Judgment normal.                             BP            HR Supine         98/58           50  Sitting           92/48           46  Standing      72/38             42  ECG G reveals bradycardia with PVCs.  Poor R wave progression without any ischemic changes.    Assessment & Plan    1. Bradycardia May need cardiology referral. - EKG 12-Lead - CBC with Differential - Comprehensive metabolic panel - TSH - Troponin I - Pro b natriuretic peptide (BNP)[facility])  2. Hypotension, unspecified hypotension type Encourage patient to hydrate for now.  He does not wish to go back in the hospital.  Will order stat labs and will stop his Lasix and Invokamet for now.  Almost 1 hour spent in sling and coordination of care for this patient.RTC 2 days. - EKG 12-Lead - CBC with Differential - Comprehensive metabolic panel - TSH - Troponin I - Pro b natriuretic peptide (BNP)[facility] [facility])  3. Upper GI bleed Followed by GI.  4. Esophageal and gastric varices (HCC)   5. Diabetes mellitus type 2 in obese (HCC)   6. Class 3 severe obesity due to excess calories with serious comorbidity and body mass index (BMI) of 40.0 to 44.9 in adult ([place])   7. Other cirrhosis of liver (HCC) NASH.     Richard Cranford Mon, MD  [facility]

## 2018-07-25 NOTE — Patient Instructions (Signed)
Stp taking Lasix and Invokamet. Follow up at 11 am tomorrow.

## 2018-07-26 ENCOUNTER — Encounter: Payer: Self-pay | Admitting: Family Medicine

## 2018-07-26 ENCOUNTER — Ambulatory Visit: Payer: BLUE CROSS/BLUE SHIELD | Admitting: Family Medicine

## 2018-07-26 VITALS — BP 122/62 | HR 63 | Temp 97.6°F | Wt 319.0 lb

## 2018-07-26 DIAGNOSIS — I8510 Secondary esophageal varices without bleeding: Secondary | ICD-10-CM | POA: Diagnosis not present

## 2018-07-26 DIAGNOSIS — I951 Orthostatic hypotension: Secondary | ICD-10-CM | POA: Diagnosis not present

## 2018-07-26 DIAGNOSIS — K922 Gastrointestinal hemorrhage, unspecified: Secondary | ICD-10-CM

## 2018-07-26 DIAGNOSIS — Z6841 Body Mass Index (BMI) 40.0 and over, adult: Secondary | ICD-10-CM

## 2018-07-26 DIAGNOSIS — K7469 Other cirrhosis of liver: Secondary | ICD-10-CM

## 2018-07-26 LAB — COMPREHENSIVE METABOLIC PANEL
ALT: 24 IU/L (ref 0–44)
AST: 42 IU/L — ABNORMAL HIGH (ref 0–40)
Albumin/Globulin Ratio: 0.8 — ABNORMAL LOW (ref 1.2–2.2)
Albumin: 2.8 g/dL — ABNORMAL LOW (ref 3.8–4.9)
Alkaline Phosphatase: 130 IU/L — ABNORMAL HIGH (ref 39–117)
BUN/Creatinine Ratio: 13 (ref 9–20)
BUN: 14 mg/dL (ref 6–24)
Bilirubin Total: 2.3 mg/dL — ABNORMAL HIGH (ref 0.0–1.2)
CO2: 24 mmol/L (ref 20–29)
Calcium: 8.5 mg/dL — ABNORMAL LOW (ref 8.7–10.2)
Chloride: 101 mmol/L (ref 96–106)
Creatinine, Ser: 1.09 mg/dL (ref 0.76–1.27)
GFR calc Af Amer: 87 mL/min/{1.73_m2} (ref >59)
GFR calc non Af Amer: 75 mL/min/{1.73_m2} (ref >59)
Globulin, Total: 3.4 g/dL (ref 1.5–4.5)
Glucose: 332 mg/dL — ABNORMAL HIGH (ref 65–99)
Potassium: 3.9 mmol/L (ref 3.5–5.2)
Sodium: 138 mmol/L (ref 134–144)
Total Protein: 6.2 g/dL (ref 6.0–8.5)

## 2018-07-26 LAB — CBC WITH DIFFERENTIAL/PLATELET
Basophils Absolute: 0.1 10*3/uL (ref 0.0–0.2)
Basos: 1 %
EOS (ABSOLUTE): 0.4 10*3/uL (ref 0.0–0.4)
Eos: 7 %
Hematocrit: 30.4 % — ABNORMAL LOW (ref 37.5–51.0)
Hemoglobin: 10.6 g/dL — ABNORMAL LOW (ref 13.0–17.7)
Immature Grans (Abs): 0 10*3/uL (ref 0.0–0.1)
Immature Granulocytes: 0 %
Lymphocytes Absolute: 1.5 10*3/uL (ref 0.7–3.1)
Lymphs: 25 %
MCH: 33.5 pg — ABNORMAL HIGH (ref 26.6–33.0)
MCHC: 34.9 g/dL (ref 31.5–35.7)
MCV: 96 fL (ref 79–97)
Monocytes Absolute: 0.5 10*3/uL (ref 0.1–0.9)
Monocytes: 8 %
Neutrophils Absolute: 3.6 10*3/uL (ref 1.4–7.0)
Neutrophils: 59 %
RBC: 3.16 x10E6/uL — ABNORMAL LOW (ref 4.14–5.80)
RDW: 14.8 % (ref 11.6–15.4)
WBC: 6 10*3/uL (ref 3.4–10.8)

## 2018-07-26 LAB — PRO B NATRIURETIC PEPTIDE: NT-Pro BNP: 651 pg/mL — ABNORMAL HIGH (ref 0–210)

## 2018-07-26 LAB — TROPONIN I: Troponin I: <0.01 ng/mL (ref 0.00–0.04)

## 2018-07-26 LAB — TSH: TSH: 1.94 u[IU]/mL (ref 0.450–4.500)

## 2018-07-26 NOTE — Progress Notes (Signed)
Patient: Jesse Zimmerman Male    DOB: 01/11/1961   58 y.o.   MRN: [id number] Visit Date: 07/26/2018  Today's Provider: Wilhemena Durie, MD   Chief Complaint  Patient presents with  . Bradycardia  . Hypotension   Subjective:     HPI Patient presents for a 1 day follow up for bradycardia, dizziness, and hypotension. Patient was seen on 07/25/2018. He was advised to hold Lasix and Invokamet. He reports good compliance with treatment plan. He states he feels better today. Still experiencing some mild dizziness, but much improved.   BP Readings from Last 3 Encounters:  07/26/18 122/62  07/25/18 (!) 88/38  07/19/18 (!) 121/58   Pulse Readings from Last 3 Encounters:  07/26/18 63  07/25/18 66  07/19/18 60    Allergies  Allergen Reactions  . Latex     LATEX TAPE  . Pineapple Hives     Current Outpatient Medications:  .  acetaminophen (TYLENOL) 325 MG tablet, Take 650 mg by mouth every 6 (six) hours as needed for mild pain or fever., Disp: , Rfl:  .  hydrocortisone (ANUSOL-HC) 25 MG suppository, Place 1 suppository (25 mg total) rectally at bedtime as needed for hemorrhoids or anal itching., Disp: 12 suppository, Rfl: 5 .  ibuprofen (ADVIL,MOTRIN) 200 MG tablet, Take 200 mg by mouth every 6 (six) hours as needed for moderate pain., Disp: , Rfl:  .  ondansetron (ZOFRAN) 4 MG tablet, Take 1 tablet (4 mg total) by mouth every 8 (eight) hours as needed for nausea or vomiting., Disp: 45 tablet, Rfl: 1 .  oxyCODONE (OXY IR/ROXICODONE) 5 MG immediate release tablet, Take 5-10 mg by mouth every 3 (three) hours as needed for severe pain., Disp: , Rfl:  .  pantoprazole (PROTONIX) 40 MG tablet, Take 1 tablet (40 mg total) by mouth 2 (two) times daily., Disp: 60 tablet, Rfl: 0 .  propranolol (INDERAL) 20 MG tablet, Take 20 mg by mouth 2 (two) times daily. , Disp: , Rfl:  .  sildenafil (REVATIO) 20 MG tablet, 2 to 4 tablets daily as needed, Disp: 100 tablet, Rfl: 5 .  tamsulosin  (FLOMAX) 0.4 MG CAPS capsule, Take 1 capsule (0.4 mg total) by mouth daily., Disp: 90 capsule, Rfl: 3 .  ALPRAZolam (XANAX) 0.5 MG tablet, Take 1 tablet (0.5 mg total) by mouth at bedtime as needed for anxiety. (Patient not taking: Reported on 07/17/2018), Disp: 30 tablet, Rfl: 1 .  furosemide (LASIX) 20 MG tablet, 1-2 pills daily (Patient not taking: Reported on 07/26/2018), Disp: 180 tablet, Rfl: 1 .  INVOKAMET XR 50-1000 MG TB24, TAKE 1 TABLET BY MOUTH  DAILY (Patient not taking: Reported on 07/26/2018), Disp: 120 tablet, Rfl: 3  Review of Systems  Constitutional: Negative.   HENT: Negative.   Eyes: Negative.   Respiratory: Negative.   Cardiovascular: Negative.   Endocrine: Negative.   Musculoskeletal: Negative.   Allergic/Immunologic: Negative.   Neurological: Positive for dizziness.  Hematological: Negative.   Psychiatric/Behavioral: Negative.     Social History   Tobacco Use  . Smoking status: Never Smoker  . Smokeless tobacco: Never Used  Substance Use Topics  . Alcohol use: No      Objective:   BP 122/62 (BP Location: Right Arm, Patient Position: Sitting, Cuff Size: Large)   Pulse 63   Temp 97.6 F (36.4 C) (Oral)   Wt (!) 319 lb (144.7 kg)   SpO2 98%   BMI 43.26 kg/m  Vitals:  07/26/18 1101  BP: 122/62  Pulse: 63  Temp: 97.6 F (36.4 C)  TempSrc: Oral  SpO2: 98%  Weight: (!) 319 lb (144.7 kg)     Physical Exam Constitutional:      Appearance: He is well-developed.  HENT:     Head: Normocephalic and atraumatic.     Right Ear: External ear normal.     Left Ear: External ear normal.     Nose: Nose normal.  Eyes:     General: No scleral icterus.    Conjunctiva/sclera: Conjunctivae normal.  Neck:     Thyroid: No thyromegaly.  Cardiovascular:     Rate and Rhythm: Normal rate and regular rhythm.     Heart sounds: Normal heart sounds.  Pulmonary:     Effort: Pulmonary effort is normal.     Breath sounds: Normal breath sounds.  Abdominal:      Palpations: Abdomen is soft.     Tenderness: There is no abdominal tenderness.  Skin:    General: Skin is warm and dry.  Neurological:     General: No focal deficit present.     Mental Status: He is alert and oriented to person, place, and time.  Psychiatric:        Mood and Affect: Mood normal.        Behavior: Behavior normal.        Thought Content: Thought content normal.        Judgment: Judgment normal.         Assessment & Plan    1. Orthostatic hypotension Resolved.  They also Lasix.  Start invoke a met in a couple of days.  To clinic 2 weeks.  2. Secondary esophageal varices without bleeding (HCC)   3. Upper GI bleed   4. Other cirrhosis of liver (HCC)   5. Class 3 severe obesity due to excess calories with serious comorbidity and body mass index (BMI) of 40.0 to 44.9 in adult [facility])  I have done the exam and reviewed the chart and it is accurate to the best of my knowledge. Development worker, community has been used and  any errors in dictation or transcription are unintentional. Miguel Aschoff M.D. [place], MD  [facility]

## 2018-08-31 ENCOUNTER — Encounter: Payer: Self-pay | Admitting: Family Medicine

## 2018-08-31 ENCOUNTER — Other Ambulatory Visit: Payer: Self-pay

## 2018-08-31 ENCOUNTER — Ambulatory Visit (INDEPENDENT_AMBULATORY_CARE_PROVIDER_SITE_OTHER): Payer: BLUE CROSS/BLUE SHIELD | Admitting: Family Medicine

## 2018-08-31 VITALS — BP 128/60 | HR 67 | Temp 98.1°F | Resp 16 | Wt 349.0 lb

## 2018-08-31 DIAGNOSIS — R188 Other ascites: Secondary | ICD-10-CM

## 2018-08-31 DIAGNOSIS — R05 Cough: Secondary | ICD-10-CM | POA: Diagnosis not present

## 2018-08-31 DIAGNOSIS — R059 Cough, unspecified: Secondary | ICD-10-CM

## 2018-08-31 DIAGNOSIS — K766 Portal hypertension: Secondary | ICD-10-CM | POA: Diagnosis not present

## 2018-08-31 DIAGNOSIS — K7460 Unspecified cirrhosis of liver: Secondary | ICD-10-CM | POA: Diagnosis not present

## 2018-08-31 MED ORDER — SPIRONOLACTONE 25 MG PO TABS: 25.0000 mg | ORAL_TABLET | Freq: Every day | ORAL | 3 refills | Status: DC

## 2018-08-31 NOTE — Progress Notes (Signed)
Patient: Jesse Zimmerman Male    DOB: [birth year]   58 y.o.   MRN: [id number] Visit Date: 08/31/2018  Today's Provider: Vernie Murders, PA   Chief Complaint  Patient presents with  . Cough   Subjective:     Cough  This is a new problem. The current episode started more than 1 month ago (2 months). The problem occurs every few minutes. The cough is non-productive. Associated symptoms include ear congestion, ear pain, nasal congestion, postnasal drip, rhinorrhea, shortness of breath and wheezing. Pertinent negatives include no chest pain, chills, fever, headaches, heartburn, hemoptysis, myalgias, rash, sore throat, sweats or weight loss.   Patient has had cough for 2 months. Cough is not productive. Patient also has symptoms of ear congestion, ear pain, nasal congestion, runny nose, shortness of breath, wheezing, and chest tightness. Taking Claritin with mild relief.   Past Medical History:  Diagnosis Date  . Chronic back pain   . Cirrhosis ([place])    NASH  . Diabetes mellitus without complication ([place])   . Esophageal varices ([place])   . Hyperlipidemia   . NASH (nonalcoholic steatohepatitis)    Patient Active Problem List   Diagnosis Date Noted  . Melena   . Acute gastric ulcer with hemorrhage   . Acute gastritis without hemorrhage   . GI bleed 07/17/2018  . Blood in stool   . Secondary esophageal varices without bleeding ([place])   . Portal hypertension ([place])   . Upper GI bleed 04/17/2017  . Contusion, back 12/26/2014  . Bruising 12/26/2014  . Back pain, chronic 11/22/2014  . Diabetes mellitus type 2 in obese ([place]) 11/22/2014  . 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 Acid reflux 11/22/2014  . HLD (hyperlipidemia) 11/22/2014  . BP (high blood pressure) 11/22/2014  . 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 Cannot sleep 11/22/2014  . Cirrhosis ([place]) 11/22/2014  . Adiposity 11/22/2014  . Change in blood platelet count 11/22/2014  . Avitaminosis D 11/22/2014  . Encephalopathy,  hepatic ([place]) 05/23/2012  . 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 Esophageal and gastric varices ([place]) 05/08/2012   Past Surgical History:  Procedure Laterality Date  . COLONOSCOPY WITH PROPOFOL N/A 08/12/2017   Procedure: COLONOSCOPY WITH PROPOFOL;  Surgeon: Jonathon Bellows, MD;  Location: [facility] ENDOSCOPY;  Service: Gastroenterology;  Laterality: N/A;  . ESOPHAGOGASTRODUODENOSCOPY (EGD) WITH PROPOFOL N/A 04/18/2017   Procedure: ESOPHAGOGASTRODUODENOSCOPY (EGD) WITH PROPOFOL;  Surgeon: [name], MD;  Location: [facility] ENDOSCOPY;  Service: Endoscopy;  Laterality: N/A;  . ESOPHAGOGASTRODUODENOSCOPY (EGD) WITH PROPOFOL N/A 07/18/2018   Procedure: ESOPHAGOGASTRODUODENOSCOPY (EGD) WITH PROPOFOL;  Surgeon: [name], MD;  Location[facility] ENDOSCOPY;  Service: Endoscopy;  Laterality: N/A;  . EXCESSIVE THIGH / HIP / BUTTOCK / FLANK SKIN EXCISION     PART OF INNER THIGH/DUE TO SEPSIS INFECTION REMOVED  . KIDNEY STONE SURGERY     removed  . TONSILLECTOMY AND ADENOIDECTOMY     Family History  Problem Relation Age of Onset  . Hypertension Mother   . Breast cancer Mother   . Dementia Mother   . Heart attack Father   . Gout Father   . Hypertension Father   . Hypertension Sister   . Hypertension Sister   . Colon cancer Maternal Aunt   . Colon cancer Maternal Grandmother   . Colon cancer Paternal Grandmother    Allergies  Allergen Reactions  . Latex     LATEX TAPE  . Pineapple Hives    Current Outpatient Medications:  .  acetaminophen (TYLENOL)  325 MG tablet, Take 650 mg by mouth every 6 (six) hours as needed for mild pain or fever., Disp: , Rfl:  .  furosemide (LASIX) 20 MG tablet, 1-2 pills daily, Disp: 180 tablet, Rfl: 1 .  hydrocortisone (ANUSOL-HC) 25 MG suppository, Place 1 suppository (25 mg total) rectally at bedtime as needed for hemorrhoids or anal itching., Disp: 12 suppository, Rfl: 5 .  INVOKAMET XR 50-1000 MG TB24, TAKE 1 TABLET BY MOUTH  DAILY, Disp: 120 tablet, Rfl: 3 .  ondansetron  (ZOFRAN) 4 MG tablet, Take 1 tablet (4 mg total) by mouth every 8 (eight) hours as needed for nausea or vomiting., Disp: 45 tablet, Rfl: 1 .  oxyCODONE (OXY IR/ROXICODONE) 5 MG immediate release tablet, Take 5-10 mg by mouth every 3 (three) hours as needed for severe pain., Disp: , Rfl:  .  propranolol (INDERAL) 20 MG tablet, Take 20 mg by mouth 2 (two) times daily. , Disp: , Rfl:  .  tamsulosin (FLOMAX) 0.4 MG CAPS capsule, Take 1 capsule (0.4 mg total) by mouth daily., Disp: 90 capsule, Rfl: 3 .  ALPRAZolam (XANAX) 0.5 MG tablet, Take 1 tablet (0.5 mg total) by mouth at bedtime as needed for anxiety. (Patient not taking: Reported on 07/17/2018), Disp: 30 tablet, Rfl: 1 .  ibuprofen (ADVIL,MOTRIN) 200 MG tablet, Take 200 mg by mouth every 6 (six) hours as needed for moderate pain., Disp: , Rfl:  .  pantoprazole (PROTONIX) 40 MG tablet, Take 1 tablet (40 mg total) by mouth 2 (two) times daily. (Patient not taking: Reported on 08/31/2018), Disp: 60 tablet, Rfl: 0 .  sildenafil (REVATIO) 20 MG tablet, 2 to 4 tablets daily as needed (Patient not taking: Reported on 08/31/2018), Disp: 100 tablet, Rfl: 5  Review of Systems  Constitutional: Negative for appetite change, chills, fever and weight loss.  HENT: Positive for congestion, ear pain, postnasal drip and rhinorrhea. Negative for sore throat.   Respiratory: Positive for cough, chest tightness, shortness of breath and wheezing. Negative for hemoptysis.   Cardiovascular: Negative for chest pain and palpitations.  Gastrointestinal: Negative for abdominal pain, heartburn, nausea and vomiting.  Musculoskeletal: Negative for myalgias.  Skin: Negative for rash.  Neurological: Negative for headaches.   Social History   Tobacco Use  . Smoking status: Never Smoker  . Smokeless tobacco: Never Used  Substance Use Topics  . Alcohol use: No     Objective:   BP 128/60 (BP Location: Right Arm, Patient Position: Sitting, Cuff Size: Large)   Pulse 67   Temp  98.1 F (36.7 C) (Oral)   Resp 16   Wt (!) 349 lb (158.3 kg)   SpO2 98%   BMI 47.33 kg/m    Wt Readings from Last 3 Encounters:  08/31/18 (!) 349 lb (158.3 kg)  07/26/18 (!) 319 lb (144.7 kg)  07/25/18 (!) 317 lb 12.8 oz (144.2 kg)   Vitals:   08/31/18 1427  BP: 128/60  Pulse: 67  Resp: 16  Temp: 98.1 F (36.7 C)  TempSrc: Oral  SpO2: 98%  Weight: (!) 349 lb (158.3 kg)     Physical Exam Constitutional:      General: He is not in acute distress.    Appearance: He is well-developed.  HENT:     Head: Normocephalic and atraumatic.     Right Ear: Hearing and tympanic membrane normal.     Left Ear: Hearing and tympanic membrane normal.     Nose: Nose normal.     Mouth/Throat:  Pharynx: Oropharynx is clear.  Eyes:     General: Lids are normal. No scleral icterus.       Right eye: No discharge.        Left eye: No discharge.     Conjunctiva/sclera: Conjunctivae normal.  Neck:     Musculoskeletal: Neck supple.  Cardiovascular:     Heart sounds: Normal heart sounds.     Comments: Slight irregularity (questionable PVC's). Pulmonary:     Effort: Pulmonary effort is normal. No respiratory distress.  Abdominal:     General: Bowel sounds are normal.     Palpations: There is no mass.     Tenderness: There is no abdominal tenderness. There is no guarding or rebound.     Comments: Tense abdomen.  Musculoskeletal: Normal range of motion.        General: Swelling present.     Right lower leg: Edema present.     Left lower leg: Edema present.     Comments: Tense edema of legs up to abdomen. Edema in scrotum.  Skin:    Findings: No lesion or rash.  Neurological:     Mental Status: He is alert and oriented to person, place, and time.  Psychiatric:        Speech: Speech normal.        Behavior: Behavior normal.        Thought Content: Thought content normal.       Assessment & Plan    1. Cough Intermittent ticklish cough with rhinorrhea over the past month. Suspect  secondary to allergic rhinitis. May continue Claritin or Zyrtec prn and add a little Delsym or Robitussin-DM prn. No fever or wheezing today.  2. Cirrhosis of liver with ascites, unspecified hepatic cirrhosis type ([place]) Long history of liver cirrhosis with large amount of ascites in legs up through abdomen causing fatigue and some dyspnea at times. Not much response to the Furosemide 20 mg 1-2 tablets qd. No nausea or vomiting. Has had some esophageal varices in the past (ho hemoptysis or hematemesis recently). Add Spironolactone 25 mg BID today to help with ascites reduction. May need to add Metolazone if not much response in the next 3 days. Will check weight daily at home and call us if continuing to gain (weight up 30 lbs in the past month). - spironolactone (ALDACTONE) 25 MG tablet; Take 1 tablet (25 mg total) by mouth daily.  Dispense: 60 tablet; Refill: 3  3. Portal hypertension (HCC) Has had some hepatic encephalopathy in the past with portal hypertension and ascites secondary to nonalcoholic liver cirrhosis. No confusion or "brain fog" recently. Keep appointment for follow up with Dr. Rosanna Randy on 09-06-18 at 3:00 pm.     Vernie Murders, [facility]

## 2018-09-06 ENCOUNTER — Encounter: Payer: Self-pay | Admitting: Family Medicine

## 2018-09-06 ENCOUNTER — Other Ambulatory Visit: Payer: Self-pay

## 2018-09-06 ENCOUNTER — Ambulatory Visit (INDEPENDENT_AMBULATORY_CARE_PROVIDER_SITE_OTHER): Payer: BLUE CROSS/BLUE SHIELD | Admitting: Family Medicine

## 2018-09-06 VITALS — BP 110/64 | HR 58 | Temp 98.0°F | Resp 16 | Ht 72.0 in | Wt 354.0 lb

## 2018-09-06 DIAGNOSIS — E669 Obesity, unspecified: Secondary | ICD-10-CM | POA: Diagnosis not present

## 2018-09-06 DIAGNOSIS — R609 Edema, unspecified: Secondary | ICD-10-CM | POA: Diagnosis not present

## 2018-09-06 DIAGNOSIS — E1169 Type 2 diabetes mellitus with other specified complication: Secondary | ICD-10-CM

## 2018-09-06 DIAGNOSIS — I509 Heart failure, unspecified: Secondary | ICD-10-CM

## 2018-09-06 DIAGNOSIS — R635 Abnormal weight gain: Secondary | ICD-10-CM

## 2018-09-06 DIAGNOSIS — K7460 Unspecified cirrhosis of liver: Secondary | ICD-10-CM

## 2018-09-06 DIAGNOSIS — R05 Cough: Secondary | ICD-10-CM

## 2018-09-06 DIAGNOSIS — M791 Myalgia, unspecified site: Secondary | ICD-10-CM

## 2018-09-06 DIAGNOSIS — R600 Localized edema: Secondary | ICD-10-CM | POA: Diagnosis not present

## 2018-09-06 DIAGNOSIS — R188 Other ascites: Secondary | ICD-10-CM

## 2018-09-06 DIAGNOSIS — R059 Cough, unspecified: Secondary | ICD-10-CM

## 2018-09-06 LAB — POCT GLYCOSYLATED HEMOGLOBIN (HGB A1C): Hemoglobin A1C: 6.7 % — AB (ref 4.0–5.6)

## 2018-09-06 MED ORDER — FUROSEMIDE 40 MG PO TABS: 40.0000 mg | ORAL_TABLET | Freq: Every day | ORAL | 3 refills | Status: DC

## 2018-09-06 MED ORDER — CANAGLIFLOZIN-METFORMIN HCL ER 50-1000 MG PO TB24: 1.0000 | ORAL_TABLET | Freq: Every day | ORAL | 3 refills | Status: DC

## 2018-09-06 MED ORDER — SPIRONOLACTONE 25 MG PO TABS: 25.0000 mg | ORAL_TABLET | Freq: Two times a day (BID) | ORAL | 3 refills | Status: DC

## 2018-09-06 NOTE — Progress Notes (Addendum)
Patient: Jesse Zimmerman Male    DOB: [birth year]   58 y.o.   MRN: [id number] Visit Date: 09/06/2018  Today's Provider: Wilhemena Durie, MD   Chief Complaint  Patient presents with  . Follow-up  . Diabetes  . Leg Swelling   Subjective:     HPI    Diabetes Mellitus Type II, Follow-up:   Lab Results  Component Value Date   HGBA1C 6.7 (A) 09/06/2018   HGBA1C 8.1 (H) 05/16/2018   HGBA1C 9.7 (H) 12/23/2017  Last seen for diabetes 4 months ago.  Management since then includes no changes. He reports good compliance with treatment. He is not having side effects.  Current symptoms include none and have been stable. Home blood sugar records: trend: stable  Episodes of hypoglycemia? no  Most Recent Eye Exam: due Weight trend: increasing steadily Prior visit with dietician: No Current exercise: no regular exercise  Pertinent Labs:    Component Value Date/Time   CHOL 124 12/23/2017 0943   TRIG 129 12/23/2017 0943   HDL 45 12/23/2017 0943   LDLCALC 53 12/23/2017 0943   CREATININE 1.09 07/25/2018 1446   CREATININE 1.14 06/11/2013 0902    Edema Patient was seen in the office by Vernie Murders, PA on 08/21/2018 and it was noted that the patient had gained 30lbs in 1 month. He was started on Spironolactone 38m daily. Patient reports that he has been tolerating med changes well. However, patient reports that he is still gaining fluid. He does mention that he is more short of breath than usual, only with exertion.  He feels swollen and abdomen and his legs feel tight all the way down. He has a mild cough.  No PND or orthopnea.  Overall he does not feel sick.  Of note he is gained 35 pounds in 6 weeks. Wt Readings from Last 3 Encounters:  09/06/18 (!) 354 lb (160.6 kg)  08/31/18 (!) 349 lb (158.3 kg)  07/26/18 (!) 319 lb (144.7 kg)     Allergies  Allergen Reactions  . Latex     LATEX TAPE  . Pineapple Hives     Current Outpatient Medications:  .   acetaminophen (TYLENOL) 325 MG tablet, Take 650 mg by mouth every 6 (six) hours as needed for mild pain or fever., Disp: , Rfl:  .  furosemide (LASIX) 20 MG tablet, 1-2 pills daily, Disp: 180 tablet, Rfl: 1 .  ibuprofen (ADVIL,MOTRIN) 200 MG tablet, Take 200 mg by mouth every 6 (six) hours as needed for moderate pain., Disp: , Rfl:  .  INVOKAMET XR 50-1000 MG TB24, TAKE 1 TABLET BY MOUTH  DAILY, Disp: 120 tablet, Rfl: 3 .  ondansetron (ZOFRAN) 4 MG tablet, Take 1 tablet (4 mg total) by mouth every 8 (eight) hours as needed for nausea or vomiting., Disp: 45 tablet, Rfl: 1 .  oxyCODONE (OXY IR/ROXICODONE) 5 MG immediate release tablet, Take 5-10 mg by mouth every 3 (three) hours as needed for severe pain., Disp: , Rfl:  .  propranolol (INDERAL) 20 MG tablet, Take 20 mg by mouth 2 (two) times daily. , Disp: , Rfl:  .  spironolactone (ALDACTONE) 25 MG tablet, Take 1 tablet (25 mg total) by mouth daily., Disp: 60 tablet, Rfl: 3 .  tamsulosin (FLOMAX) 0.4 MG CAPS capsule, Take 1 capsule (0.4 mg total) by mouth daily., Disp: 90 capsule, Rfl: 3 .  ALPRAZolam (XANAX) 0.5 MG tablet, Take 1 tablet (0.5 mg total) by mouth at bedtime  as needed for anxiety. (Patient not taking: Reported on 07/17/2018), Disp: 30 tablet, Rfl: 1 .  hydrocortisone (ANUSOL-HC) 25 MG suppository, Place 1 suppository (25 mg total) rectally at bedtime as needed for hemorrhoids or anal itching. (Patient not taking: Reported on 09/06/2018), Disp: 12 suppository, Rfl: 5 .  pantoprazole (PROTONIX) 40 MG tablet, Take 1 tablet (40 mg total) by mouth 2 (two) times daily. (Patient not taking: Reported on 08/31/2018), Disp: 60 tablet, Rfl: 0 .  sildenafil (REVATIO) 20 MG tablet, 2 to 4 tablets daily as needed (Patient not taking: Reported on 08/31/2018), Disp: 100 tablet, Rfl: 5  Review of Systems  Constitutional: Positive for activity change, appetite change and fatigue.  HENT: Negative.   Eyes: Negative.   Respiratory: Positive for shortness of  breath.   Cardiovascular: Positive for leg swelling. Negative for chest pain and palpitations.  Endocrine: Negative for cold intolerance, heat intolerance, polydipsia, polyphagia and polyuria.  Genitourinary: Negative.   Musculoskeletal: Positive for arthralgias. Negative for myalgias.  Skin: Negative for color change, pallor, rash and wound.  Allergic/Immunologic: Negative.   Neurological: Negative for dizziness, light-headedness and headaches.  Hematological: Negative.   Psychiatric/Behavioral: Negative for agitation, self-injury, sleep disturbance and suicidal ideas. The patient is not nervous/anxious.   ECG reveals this rhythm with a rate of 60.  Poor R wave progression.  No ischemic changes noted.  Social History   Tobacco Use  . Smoking status: Never Smoker  . Smokeless tobacco: Never Used  Substance Use Topics  . Alcohol use: No      Objective:   BP 110/64 (BP Location: Right Arm, Patient Position: Sitting, Cuff Size: Large)   Pulse (!) 58   Temp 98 F (36.7 C)   Resp 16   Ht 6' (1.829 m)   Wt (!) 354 lb (160.6 kg)   SpO2 100%   BMI 48.01 kg/m  Vitals:   09/06/18 1536  BP: 110/64  Pulse: (!) 58  Resp: 16  Temp: 98 F (36.7 C)  SpO2: 100%  Weight: (!) 354 lb (160.6 kg)  Height: 6' (1.829 m)     Physical Exam Vitals signs reviewed.  Constitutional:      Appearance: Normal appearance. He is obese.  HENT:     Head: Normocephalic and atraumatic.     Right Ear: External ear normal.     Left Ear: External ear normal.     Nose: Nose normal.     Mouth/Throat:     Pharynx: Oropharynx is clear.  Eyes:     General: No scleral icterus.    Conjunctiva/sclera: Conjunctivae normal.  Neck:     Comments: No JVD noted. Cardiovascular:     Rate and Rhythm: Normal rate and regular rhythm.     Pulses: Normal pulses.     Heart sounds: Normal heart sounds.  Pulmonary:     Effort: Pulmonary effort is normal.  Abdominal:     General: There is distension.      Palpations: Abdomen is soft.     Tenderness: There is no abdominal tenderness.     Comments: Possible ascites noted.  Musculoskeletal:     Right lower leg: Edema present.     Left lower leg: Edema present.     Comments: 1+ lower extremity edema.  Skin:    General: Skin is warm and dry.  Neurological:     General: No focal deficit present.     Mental Status: He is alert and oriented to person, place, and time. Mental status  is at baseline.  Psychiatric:        Mood and Affect: Mood normal.        Behavior: Behavior normal.        Thought Content: Thought content normal.        Judgment: Judgment normal.         Assessment & Plan    1. Diabetes mellitus type 2 in obese (HCC) 6.7 today.  Good control. - POCT glycosylated hemoglobin (Hb A1C) - Canagliflozin-metFORMIN HCl ER (INVOKAMET XR) 50-1000 MG TB24; Take 1 tablet by mouth daily.  Dispense: 120 tablet; Refill: 3  2. Cirrhosis of liver with ascites, unspecified hepatic cirrhosis type [facility]) Advised patient is set up GI follow-up.  I am concerned that his ascites is a big issue with his recent weight gain.  He may need paracentesis. - spironolactone (ALDACTONE) 25 MG tablet; Take 1 tablet (25 mg total) by mouth 2 (two) times daily.  Dispense: 60 tablet; Refill: 3 - CBC with Differential/Platelet  3. Edema, unspecified type  - EKG 12-Lead - TSH  4. Pedal edema  - furosemide (LASIX) 40 MG tablet; Take 1 tablet (40 mg total) by mouth daily.  Dispense: 90 tablet; Refill: 3  5. Congestive heart failure, unspecified HF chronicity, unspecified heart failure type ([place]) Clinically I think he must have a little heart failure.  At this time increase Lasix from 20 to 40 mg every morning.  Aldactone is doubled to 50 mg twice daily.  Return to clinic 1 week.  I think it is also time to refer to cardiology for evaluation.  He has been consistently mildly bradycardic but I do not think this is the issue in this 58 year old. - Comprehensive  metabolic panel - Pro b natriuretic peptide (BNP)[facility]) - Ambulatory referral to Cardiology  6. Myalgia  - CK  7. Cough  - DG Chest 2 View; Future  8. Weight gain As above.     Richard Cranford Mon, MD  [facility]

## 2018-09-07 DIAGNOSIS — K7460 Unspecified cirrhosis of liver: Secondary | ICD-10-CM | POA: Diagnosis not present

## 2018-09-07 DIAGNOSIS — I509 Heart failure, unspecified: Secondary | ICD-10-CM | POA: Diagnosis not present

## 2018-09-07 DIAGNOSIS — R188 Other ascites: Secondary | ICD-10-CM | POA: Diagnosis not present

## 2018-09-07 DIAGNOSIS — M791 Myalgia, unspecified site: Secondary | ICD-10-CM | POA: Diagnosis not present

## 2018-09-08 ENCOUNTER — Other Ambulatory Visit (HOSPITAL_COMMUNITY): Payer: Self-pay | Admitting: Rehabilitative and Restorative Service Providers"

## 2018-09-08 ENCOUNTER — Telehealth: Payer: Self-pay | Admitting: Family Medicine

## 2018-09-08 ENCOUNTER — Other Ambulatory Visit: Payer: Self-pay | Admitting: Rehabilitative and Restorative Service Providers"

## 2018-09-08 ENCOUNTER — Telehealth: Payer: Self-pay

## 2018-09-08 DIAGNOSIS — R0602 Shortness of breath: Secondary | ICD-10-CM | POA: Diagnosis not present

## 2018-09-08 DIAGNOSIS — K7581 Nonalcoholic steatohepatitis (NASH): Secondary | ICD-10-CM | POA: Diagnosis not present

## 2018-09-08 DIAGNOSIS — E785 Hyperlipidemia, unspecified: Secondary | ICD-10-CM | POA: Diagnosis not present

## 2018-09-08 DIAGNOSIS — K766 Portal hypertension: Secondary | ICD-10-CM | POA: Diagnosis not present

## 2018-09-08 LAB — CBC WITH DIFFERENTIAL/PLATELET
Basophils Absolute: 0.1 10*3/uL (ref 0.0–0.2)
Basos: 1 %
EOS (ABSOLUTE): 0.4 10*3/uL (ref 0.0–0.4)
Eos: 7 %
Hematocrit: 32.7 % — ABNORMAL LOW (ref 37.5–51.0)
Hemoglobin: 11.6 g/dL — ABNORMAL LOW (ref 13.0–17.7)
Immature Grans (Abs): 0 10*3/uL (ref 0.0–0.1)
Immature Granulocytes: 0 %
Lymphocytes Absolute: 1.3 10*3/uL (ref 0.7–3.1)
Lymphs: 24 %
MCH: 33.4 pg — ABNORMAL HIGH (ref 26.6–33.0)
MCHC: 35.5 g/dL (ref 31.5–35.7)
MCV: 94 fL (ref 79–97)
Monocytes Absolute: 0.5 10*3/uL (ref 0.1–0.9)
Monocytes: 10 %
Neutrophils Absolute: 3.1 10*3/uL (ref 1.4–7.0)
Neutrophils: 58 %
Platelets: 55 10*3/uL — CL (ref 150–450)
RBC: 3.47 x10E6/uL — ABNORMAL LOW (ref 4.14–5.80)
RDW: 13.9 % (ref 11.6–15.4)
WBC: 5.3 10*3/uL (ref 3.4–10.8)

## 2018-09-08 LAB — COMPREHENSIVE METABOLIC PANEL
ALT: 25 IU/L (ref 0–44)
AST: 48 IU/L — ABNORMAL HIGH (ref 0–40)
Albumin/Globulin Ratio: 0.9 — ABNORMAL LOW (ref 1.2–2.2)
Albumin: 3 g/dL — ABNORMAL LOW (ref 3.8–4.9)
Alkaline Phosphatase: 112 IU/L (ref 39–117)
BUN/Creatinine Ratio: 17 (ref 9–20)
BUN: 19 mg/dL (ref 6–24)
Bilirubin Total: 2.2 mg/dL — ABNORMAL HIGH (ref 0.0–1.2)
CO2: 23 mmol/L (ref 20–29)
Calcium: 8.4 mg/dL — ABNORMAL LOW (ref 8.7–10.2)
Chloride: 104 mmol/L (ref 96–106)
Creatinine, Ser: 1.13 mg/dL (ref 0.76–1.27)
GFR calc Af Amer: 83 mL/min/{1.73_m2} (ref >59)
GFR calc non Af Amer: 72 mL/min/{1.73_m2} (ref >59)
Globulin, Total: 3.5 g/dL (ref 1.5–4.5)
Glucose: 153 mg/dL — ABNORMAL HIGH (ref 65–99)
Potassium: 4.6 mmol/L (ref 3.5–5.2)
Sodium: 139 mmol/L (ref 134–144)
Total Protein: 6.5 g/dL (ref 6.0–8.5)

## 2018-09-08 LAB — TSH: TSH: 2.36 u[IU]/mL (ref 0.450–4.500)

## 2018-09-08 LAB — PRO B NATRIURETIC PEPTIDE: NT-Pro BNP: 1033 pg/mL — ABNORMAL HIGH (ref 0–210)

## 2018-09-08 LAB — CK: Total CK: 403 U/L — ABNORMAL HIGH (ref 24–204)

## 2018-09-08 NOTE — Telephone Encounter (Signed)
-----   Message from Jerrol Banana., MD sent at 09/08/2018  8:19 AM EDT ----- Mild CHF as discussed with patient.  Take medications and keep referral appointments.  Let us know if he feels worse.

## 2018-09-08 NOTE — Telephone Encounter (Signed)
[facility] w/ [facility] Fax [phone]  Needing the EKG done on pt on the 3-18.  Thanks, [employer]

## 2018-09-08 NOTE — Telephone Encounter (Signed)
LMTCB 09/08/2018  Thanks,   -Mickel Baas

## 2018-09-08 NOTE — Telephone Encounter (Signed)
Printed, awaiting faxing

## 2018-09-14 ENCOUNTER — Ambulatory Visit (INDEPENDENT_AMBULATORY_CARE_PROVIDER_SITE_OTHER): Payer: BLUE CROSS/BLUE SHIELD | Admitting: Family Medicine

## 2018-09-14 ENCOUNTER — Other Ambulatory Visit: Payer: Self-pay

## 2018-09-14 ENCOUNTER — Encounter: Payer: Self-pay | Admitting: Family Medicine

## 2018-09-14 VITALS — BP 128/78 | HR 48 | Temp 97.9°F | Resp 16 | Ht 72.0 in | Wt 355.0 lb

## 2018-09-14 DIAGNOSIS — R600 Localized edema: Secondary | ICD-10-CM | POA: Diagnosis not present

## 2018-09-14 DIAGNOSIS — E785 Hyperlipidemia, unspecified: Secondary | ICD-10-CM

## 2018-09-14 DIAGNOSIS — R188 Other ascites: Secondary | ICD-10-CM

## 2018-09-14 DIAGNOSIS — Z6841 Body Mass Index (BMI) 40.0 and over, adult: Secondary | ICD-10-CM

## 2018-09-14 DIAGNOSIS — K766 Portal hypertension: Secondary | ICD-10-CM | POA: Diagnosis not present

## 2018-09-14 DIAGNOSIS — K7460 Unspecified cirrhosis of liver: Secondary | ICD-10-CM

## 2018-09-14 MED ORDER — FUROSEMIDE 40 MG PO TABS: 40.0000 mg | ORAL_TABLET | Freq: Two times a day (BID) | ORAL | 3 refills | Status: DC

## 2018-09-14 MED ORDER — SPIRONOLACTONE 25 MG PO TABS: 25.0000 mg | ORAL_TABLET | Freq: Two times a day (BID) | ORAL | 3 refills | Status: DC

## 2018-09-14 NOTE — Telephone Encounter (Signed)
Patient has an appt today. Will advise then.

## 2018-09-14 NOTE — Progress Notes (Signed)
Patient: Jesse Zimmerman Male    DOB: [birth year]   58 y.o.   MRN: [id number] Visit Date: 09/14/2018  Today's Provider: Wilhemena Durie, MD   Chief Complaint  Patient presents with  . Follow-up   Subjective:     HPI Patient comes in today for a follow up. He was last seen in the office 1 week ago. He was advised to increase lasix to 27m daily and double spironolactone dose. Patient reports that he has not noticed much change in his weight. He has a echocardiogram set up through cardiology next week.  He saw Dr. FUbaldo Glassinglast week who did an abdominal ultrasound followed by an echocardiogram.  He has not heard the results of these test.  He has not been able to get through to GI and he think this is because of the coronavirus issue that is causing problems throughout the [place].  Patient has no PND orthopnea.  He is getting uncomfortable with all the swelling in his legs.  His weight has stabilized. Wt Readings from Last 3 Encounters:  09/14/18 (!) 355 lb (161 kg)  09/06/18 (!) 354 lb (160.6 kg)  08/31/18 (!) 349 lb (158.3 kg)   BP Readings from Last 3 Encounters:  09/14/18 128/78  09/06/18 110/64  08/31/18 128/60    Allergies  Allergen Reactions  . Latex     LATEX TAPE  . Pineapple Hives     Current Outpatient Medications:  .  acetaminophen (TYLENOL) 325 MG tablet, Take 650 mg by mouth every 6 (six) hours as needed for mild pain or fever., Disp: , Rfl:  .  Canagliflozin-metFORMIN HCl ER (INVOKAMET XR) 50-1000 MG TB24, Take 1 tablet by mouth daily., Disp: 120 tablet, Rfl: 3 .  furosemide (LASIX) 40 MG tablet, Take 1 tablet (40 mg total) by mouth daily., Disp: 90 tablet, Rfl: 3 .  ibuprofen (ADVIL,MOTRIN) 200 MG tablet, Take 200 mg by mouth every 6 (six) hours as needed for moderate pain., Disp: , Rfl:  .  ondansetron (ZOFRAN) 4 MG tablet, Take 1 tablet (4 mg total) by mouth every 8 (eight) hours as needed for nausea or vomiting., Disp: 45 tablet, Rfl: 1 .  oxyCODONE  (OXY IR/ROXICODONE) 5 MG immediate release tablet, Take 5-10 mg by mouth every 3 (three) hours as needed for severe pain., Disp: , Rfl:  .  propranolol (INDERAL) 20 MG tablet, Take 20 mg by mouth 2 (two) times daily. , Disp: , Rfl:  .  spironolactone (ALDACTONE) 25 MG tablet, Take 1 tablet (25 mg total) by mouth 2 (two) times daily., Disp: 60 tablet, Rfl: 3 .  tamsulosin (FLOMAX) 0.4 MG CAPS capsule, Take 1 capsule (0.4 mg total) by mouth daily., Disp: 90 capsule, Rfl: 3 .  ALPRAZolam (XANAX) 0.5 MG tablet, Take 1 tablet (0.5 mg total) by mouth at bedtime as needed for anxiety. (Patient not taking: Reported on 07/17/2018), Disp: 30 tablet, Rfl: 1 .  hydrocortisone (ANUSOL-HC) 25 MG suppository, Place 1 suppository (25 mg total) rectally at bedtime as needed for hemorrhoids or anal itching. (Patient not taking: Reported on 09/06/2018), Disp: 12 suppository, Rfl: 5 .  pantoprazole (PROTONIX) 40 MG tablet, Take 1 tablet (40 mg total) by mouth 2 (two) times daily. (Patient not taking: Reported on 08/31/2018), Disp: 60 tablet, Rfl: 0 .  sildenafil (REVATIO) 20 MG tablet, 2 to 4 tablets daily as needed (Patient not taking: Reported on 08/31/2018), Disp: 100 tablet, Rfl: 5  Review of Systems  Constitutional: Negative for activity change, appetite change, chills, diaphoresis, fatigue, fever and unexpected weight change.  Eyes: Negative.   Respiratory: Negative for cough.   Cardiovascular: Negative for chest pain, palpitations and leg swelling.  Genitourinary: Negative.   Allergic/Immunologic: Negative.   Neurological: Negative for dizziness and headaches.  Hematological: Negative.   Psychiatric/Behavioral: Negative.     Social History   Tobacco Use  . Smoking status: Never Smoker  . Smokeless tobacco: Never Used  Substance Use Topics  . Alcohol use: No      Objective:   BP 128/78 (BP Location: Right Arm, Patient Position: Sitting, Cuff Size: Large)   Pulse (!) 48   Temp 97.9 F (36.6 C)    Resp 16   Ht 6' (1.829 m)   Wt (!) 355 lb (161 kg)   SpO2 100%   BMI 48.15 kg/m  Vitals:   09/14/18 1601  BP: 128/78  Pulse: (!) 48  Resp: 16  Temp: 97.9 F (36.6 C)  SpO2: 100%  Weight: (!) 355 lb (161 kg)  Height: 6' (1.829 m)     Physical Exam Vitals signs reviewed.  Constitutional:      Appearance: Normal appearance. He is obese.  HENT:     Head: Normocephalic and atraumatic.     Right Ear: External ear normal.     Left Ear: External ear normal.     Nose: Nose normal.     Mouth/Throat:     Pharynx: Oropharynx is clear.  Eyes:     General: No scleral icterus.    Conjunctiva/sclera: Conjunctivae normal.  Neck:     Comments: No JVD noted. Cardiovascular:     Rate and Rhythm: Normal rate and regular rhythm.     Pulses: Normal pulses.     Heart sounds: Normal heart sounds.  Pulmonary:     Effort: Pulmonary effort is normal.  Abdominal:     General: There is distension.     Palpations: Abdomen is soft.     Tenderness: There is no abdominal tenderness.     Comments: Possible ascites noted.  Musculoskeletal:     Right lower leg: Edema present.     Left lower leg: Edema present.     Comments: 2+ lower extremity edema.  Skin:    General: Skin is warm and dry.  Neurological:     General: No focal deficit present.     Mental Status: He is alert and oriented to person, place, and time. Mental status is at baseline.  Psychiatric:        Mood and Affect: Mood normal.        Behavior: Behavior normal.        Thought Content: Thought content normal.        Judgment: Judgment normal.         Assessment & Plan    1. Portal hypertension (HCC) Patient undergoing cardiac work-up for possible CHF.  I will double his Lasix to 40 mg 2 every morning and see him back in 1 week.  Will obtain Mitzie then.  Also obtain proBNP.  He appears fairly comfortable today.  2. Cirrhosis of liver with ascites, unspecified hepatic cirrhosis type ([place]) We will try to get the patient  in touch with GI.  I think he needs to be seen in the next month despite the coronavirus issue.  This is not emergent but I am concerned about it becoming urgent.  Patient in agreement with this plan.  Weight is up 1 pound  from his previous visit. - spironolactone (ALDACTONE) 25 MG tablet; Take 1 tablet (25 mg total) by mouth 2 (two) times daily.  Dispense: 180 tablet; Refill: 3  3. Pedal edema  - furosemide (LASIX) 40 MG tablet; Take 1 tablet (40 mg total) by mouth 2 (two) times daily.  Dispense: 180 tablet; Refill: 3  4. Class 3 severe obesity due to excess calories with serious comorbidity and body mass index (BMI) of 40.0 to 44.9 in adult ([place])   5. Hyperlipidemia, unspecified hyperlipidemia type     I have done the exam and reviewed the above chart and it is accurate to the best of my knowledge. Development worker, community has been used in this note in any air is in the dictation or transcription are unintentional.  Wilhemena Durie, MD  [facility]

## 2018-09-20 ENCOUNTER — Other Ambulatory Visit: Payer: Self-pay

## 2018-09-20 ENCOUNTER — Encounter: Payer: Self-pay | Admitting: Family Medicine

## 2018-09-20 ENCOUNTER — Ambulatory Visit (INDEPENDENT_AMBULATORY_CARE_PROVIDER_SITE_OTHER): Payer: BLUE CROSS/BLUE SHIELD | Admitting: Family Medicine

## 2018-09-20 VITALS — BP 122/70 | HR 67 | Temp 97.5°F | Resp 16 | Wt 357.0 lb

## 2018-09-20 DIAGNOSIS — D696 Thrombocytopenia, unspecified: Secondary | ICD-10-CM | POA: Diagnosis not present

## 2018-09-20 DIAGNOSIS — R188 Other ascites: Secondary | ICD-10-CM

## 2018-09-20 DIAGNOSIS — R600 Localized edema: Secondary | ICD-10-CM | POA: Diagnosis not present

## 2018-09-20 DIAGNOSIS — K766 Portal hypertension: Secondary | ICD-10-CM | POA: Diagnosis not present

## 2018-09-20 DIAGNOSIS — I509 Heart failure, unspecified: Secondary | ICD-10-CM

## 2018-09-20 DIAGNOSIS — K7460 Unspecified cirrhosis of liver: Secondary | ICD-10-CM

## 2018-09-20 DIAGNOSIS — J309 Allergic rhinitis, unspecified: Secondary | ICD-10-CM

## 2018-09-20 MED ORDER — METOLAZONE 2.5 MG PO TABS: 2.5000 mg | ORAL_TABLET | Freq: Every day | ORAL | 5 refills | Status: DC

## 2018-09-20 MED ORDER — LORATADINE 10 MG PO TABS: 10.0000 mg | ORAL_TABLET | Freq: Every day | ORAL | 11 refills | Status: DC

## 2018-09-20 NOTE — Progress Notes (Signed)
Patient: Jesse Zimmerman Male    DOB: May 18, 1961   58 y.o.   MRN: [id number] Visit Date: 09/20/2018  Today's Provider: Wilhemena Durie, MD   Chief Complaint  Patient presents with  . Follow-up   Subjective:     HPI Patient comes in today for a follow up. He was seen in the office 1 week ago. Patient was advised to increase Lasix to 66m daily. He reports that he is still retaining fluid. He also mentions that his feet are more swollen than they were last week. He has no dyspnea but feels bloated in his abdomen.[facility] BP Readings from Last 3 Encounters:  09/20/18 122/70  09/14/18 128/78  09/06/18 110/64   Wt Readings from Last 3 Encounters:  09/20/18 (!) 357 lb (161.9 kg)  09/14/18 (!) 355 lb (161 kg)  09/06/18 (!) 354 lb (160.6 kg)    Allergies  Allergen Reactions  . Latex     LATEX TAPE  . Pineapple Hives     Current Outpatient Medications:  .  acetaminophen (TYLENOL) 325 MG tablet, Take 650 mg by mouth every 6 (six) hours as needed for mild pain or fever., Disp: , Rfl:  .  ALPRAZolam (XANAX) 0.5 MG tablet, Take 1 tablet (0.5 mg total) by mouth at bedtime as needed for anxiety. (Patient not taking: Reported on 07/17/2018), Disp: 30 tablet, Rfl: 1 .  Canagliflozin-metFORMIN HCl ER (INVOKAMET XR) 50-1000 MG TB24, Take 1 tablet by mouth daily., Disp: 120 tablet, Rfl: 3 .  furosemide (LASIX) 40 MG tablet, Take 1 tablet (40 mg total) by mouth 2 (two) times daily., Disp: 180 tablet, Rfl: 3 .  hydrocortisone (ANUSOL-HC) 25 MG suppository, Place 1 suppository (25 mg total) rectally at bedtime as needed for hemorrhoids or anal itching. (Patient not taking: Reported on 09/06/2018), Disp: 12 suppository, Rfl: 5 .  ibuprofen (ADVIL,MOTRIN) 200 MG tablet, Take 200 mg by mouth every 6 (six) hours as needed for moderate pain., Disp: , Rfl:  .  ondansetron (ZOFRAN) 4 MG tablet, Take 1 tablet (4 mg  total) by mouth every 8 (eight) hours as needed for nausea or vomiting., Disp: 45 tablet, Rfl: 1 .  oxyCODONE (OXY IR/ROXICODONE) 5 MG immediate release tablet, Take 5-10 mg by mouth every 3 (three) hours as needed for severe pain., Disp: , Rfl:  .  pantoprazole (PROTONIX) 40 MG tablet, Take 1 tablet (40 mg total) by mouth 2 (two) times daily. (Patient not taking: Reported on 08/31/2018), Disp: 60 tablet, Rfl: 0 .  propranolol (INDERAL) 20 MG tablet, Take 20 mg by mouth 2 (two) times daily. , Disp: , Rfl:  .  sildenafil (REVATIO) 20 MG tablet, 2 to 4 tablets daily as needed (Patient not taking: Reported on 08/31/2018), Disp: 100 tablet, Rfl: 5 .  spironolactone (ALDACTONE) 25 MG tablet, Take 1 tablet (25 mg total) by mouth 2 (two) times daily., Disp: 180 tablet, Rfl: 3 .  tamsulosin (FLOMAX) 0.4 MG CAPS capsule, Take 1 capsule (0.4 mg total) by mouth daily., Disp: 90 capsule, Rfl: 3  Review of Systems  Constitutional: Negative for activity change and fatigue.  HENT: Negative.   Eyes: Negative.   Respiratory: Negative for cough and shortness of breath.   Cardiovascular: Negative for chest pain, palpitations and leg swelling.  Gastrointestinal: Positive for abdominal distention.  Endocrine: Negative.   Musculoskeletal: Positive for myalgias.  Skin: Negative  for color change, pallor and rash.  Allergic/Immunologic: Negative.   Neurological: Negative for dizziness, light-headedness and headaches.  Psychiatric/Behavioral: Negative for agitation. The patient is not nervous/anxious.     Social History   Tobacco Use  . Smoking status: Never Smoker  . Smokeless tobacco: Never Used  Substance Use Topics  . Alcohol use: No      Objective:   BP 122/70 (BP Location: Right Arm, Patient Position: Sitting, Cuff Size: Large)   Pulse 67   Temp (!) 97.5 F (36.4 C)   Resp 16   Wt (!) 357 lb (161.9 kg)   BMI 48.42 kg/m  Vitals:   09/20/18 1527  BP: 122/70  Pulse: 67  Resp: 16  Temp: (!) 97.5  F (36.4 C)  Weight: (!) 357 lb (161.9 kg)   O2 sat is 98%  Physical Exam Vitals signs reviewed.  Constitutional:      Appearance: Normal appearance. He is obese.  HENT:     Head: Normocephalic and atraumatic.     Right Ear: External ear normal.     Left Ear: External ear normal.     Nose: Nose normal.     Mouth/Throat:     Pharynx: Oropharynx is clear.  Eyes:     General: No scleral icterus.    Conjunctiva/sclera: Conjunctivae normal.  Neck:     Comments: No JVD noted. Cardiovascular:     Rate and Rhythm: Normal rate and regular rhythm.     Pulses: Normal pulses.     Heart sounds: Normal heart sounds.  Pulmonary:     Effort: Pulmonary effort is normal.  Abdominal:     General: There is distension.     Palpations: Abdomen is soft.     Tenderness: There is no abdominal tenderness.     Comments: Possible ascites noted.  Musculoskeletal:     Right lower leg: Edema present.     Left lower leg: Edema present.     Comments: 2+ lower extremity edema which extends well above the knees.  Skin:    General: Skin is warm and dry.  Neurological:     General: No focal deficit present.     Mental Status: He is alert and oriented to person, place, and time. Mental status is at baseline.  Psychiatric:        Mood and Affect: Mood normal.        Behavior: Behavior normal.        Thought Content: Thought content normal.        Judgment: Judgment normal.         Assessment & Plan    1. Portal hypertension (HCC) Will attempt to reach [facility]. - Comprehensive metabolic panel  2. Cirrhosis of liver with ascites, unspecified hepatic cirrhosis type (HCC)  - metolazone (ZAROXOLYN) 2.5 MG tablet; Take 1 tablet (2.5 mg total) by mouth daily.  Dispense: 30 tablet; Refill: 5 - Comprehensive metabolic panel  3. Pedal edema  - metolazone (ZAROXOLYN) 2.5 MG tablet; Take 1 tablet (2.5 mg total) by mouth daily.  Dispense: 30 tablet; Refill: 5  4. Thrombocytopenia (HCC)  - CBC with  Differential/Platelet  5. Allergic rhinitis, unspecified seasonality, unspecified trigger Add antihistamine. - loratadine (CLARITIN) 10 MG tablet; Take 1 tablet (10 mg total) by mouth daily.  Dispense: 30 tablet; Refill: 11  6. Congestive heart failure, unspecified HF chronicity, unspecified heart failure type ([place]) Pt did not get CXR that was ordered--will do that now--has Echo next week.2lb weight gain since  last visit. Add Metolazone 2.5 to present meds. RTC 2 weeks and will check Met C and pro BNP.     Angelos Wasco Cranford Mon, MD  [facility]

## 2018-09-21 ENCOUNTER — Ambulatory Visit: Payer: Self-pay | Admitting: Family Medicine

## 2018-09-21 ENCOUNTER — Telehealth: Payer: Self-pay

## 2018-09-21 NOTE — Telephone Encounter (Signed)
Spoke to pt and advised that you had ordered a chest xray and some lab work.  Pt advised that he did a chest xray last week for dr Zella Richer.  Pt is asking if he can also get a liver panel ordered with his labs.  He said that he may come in tomorrow and get the labs done and would like to have the liver panel done then also.  Please let me know what you would like to do.  dbs

## 2018-09-21 NOTE — Addendum Note (Signed)
Addended by: Edd Arbour B on: 09/21/2018 01:42 PM   Modules accepted: Orders

## 2018-09-21 NOTE — Addendum Note (Signed)
Addended by: Edd Arbour B on: 09/21/2018 02:01 PM   Modules accepted: Orders

## 2018-09-27 ENCOUNTER — Other Ambulatory Visit: Payer: Self-pay | Admitting: Rehabilitative and Restorative Service Providers"

## 2018-09-27 ENCOUNTER — Other Ambulatory Visit: Payer: Self-pay | Admitting: Family Medicine

## 2018-09-27 DIAGNOSIS — K7581 Nonalcoholic steatohepatitis (NASH): Principal | ICD-10-CM

## 2018-09-27 DIAGNOSIS — K7460 Unspecified cirrhosis of liver: Secondary | ICD-10-CM

## 2018-09-27 DIAGNOSIS — K766 Portal hypertension: Secondary | ICD-10-CM | POA: Diagnosis not present

## 2018-09-27 DIAGNOSIS — R0602 Shortness of breath: Secondary | ICD-10-CM | POA: Diagnosis not present

## 2018-09-27 DIAGNOSIS — I509 Heart failure, unspecified: Secondary | ICD-10-CM | POA: Diagnosis not present

## 2018-09-27 DIAGNOSIS — D696 Thrombocytopenia, unspecified: Secondary | ICD-10-CM | POA: Diagnosis not present

## 2018-09-27 DIAGNOSIS — R188 Other ascites: Secondary | ICD-10-CM | POA: Diagnosis not present

## 2018-09-27 DIAGNOSIS — E119 Type 2 diabetes mellitus without complications: Secondary | ICD-10-CM | POA: Diagnosis not present

## 2018-09-28 ENCOUNTER — Other Ambulatory Visit: Payer: Self-pay

## 2018-09-28 ENCOUNTER — Ambulatory Visit
Admission: RE | Admit: 2018-09-28 | Discharge: 2018-09-28 | Disposition: A | Discharge status: Home / Self Care | Payer: BLUE CROSS/BLUE SHIELD | Source: Ambulatory Visit | Attending: Rehabilitative and Restorative Service Providers" | Admitting: Rehabilitative and Restorative Service Providers"

## 2018-09-28 DIAGNOSIS — K7581 Nonalcoholic steatohepatitis (NASH): Secondary | ICD-10-CM | POA: Insufficient documentation

## 2018-09-28 DIAGNOSIS — R188 Other ascites: Secondary | ICD-10-CM | POA: Diagnosis not present

## 2018-09-28 DIAGNOSIS — K7460 Unspecified cirrhosis of liver: Secondary | ICD-10-CM | POA: Diagnosis not present

## 2018-09-28 LAB — CBC WITH DIFFERENTIAL/PLATELET
Basophils Absolute: 0 10*3/uL (ref 0.0–0.2)
Basos: 1 %
EOS (ABSOLUTE): 0.4 10*3/uL (ref 0.0–0.4)
Eos: 7 %
Hematocrit: 27.5 % — ABNORMAL LOW (ref 37.5–51.0)
Hemoglobin: 10.1 g/dL — ABNORMAL LOW (ref 13.0–17.7)
Immature Grans (Abs): 0 10*3/uL (ref 0.0–0.1)
Immature Granulocytes: 0 %
Lymphocytes Absolute: 1.1 10*3/uL (ref 0.7–3.1)
Lymphs: 21 %
MCH: 33.1 pg — ABNORMAL HIGH (ref 26.6–33.0)
MCHC: 36.7 g/dL — ABNORMAL HIGH (ref 31.5–35.7)
MCV: 90 fL (ref 79–97)
Monocytes Absolute: 0.5 10*3/uL (ref 0.1–0.9)
Monocytes: 9 %
Neutrophils Absolute: 3.2 10*3/uL (ref 1.4–7.0)
Neutrophils: 62 %
Platelets: 48 10*3/uL — CL (ref 150–450)
RBC: 3.05 x10E6/uL — ABNORMAL LOW (ref 4.14–5.80)
RDW: 13.8 % (ref 11.6–15.4)
WBC: 5.2 10*3/uL (ref 3.4–10.8)

## 2018-09-28 LAB — COMPREHENSIVE METABOLIC PANEL
ALT: 21 IU/L (ref 0–44)
AST: 47 IU/L — ABNORMAL HIGH (ref 0–40)
Albumin/Globulin Ratio: 0.7 — ABNORMAL LOW (ref 1.2–2.2)
Albumin: 2.7 g/dL — ABNORMAL LOW (ref 3.8–4.9)
Alkaline Phosphatase: 118 IU/L — ABNORMAL HIGH (ref 39–117)
BUN/Creatinine Ratio: 16 (ref 9–20)
BUN: 17 mg/dL (ref 6–24)
Bilirubin Total: 2.2 mg/dL — ABNORMAL HIGH (ref 0.0–1.2)
CO2: 25 mmol/L (ref 20–29)
Calcium: 8.3 mg/dL — ABNORMAL LOW (ref 8.7–10.2)
Chloride: 104 mmol/L (ref 96–106)
Creatinine, Ser: 1.07 mg/dL (ref 0.76–1.27)
GFR calc Af Amer: 89 mL/min/{1.73_m2} (ref >59)
GFR calc non Af Amer: 77 mL/min/{1.73_m2} (ref >59)
Globulin, Total: 3.8 g/dL (ref 1.5–4.5)
Glucose: 148 mg/dL — ABNORMAL HIGH (ref 65–99)
Potassium: 4.2 mmol/L (ref 3.5–5.2)
Sodium: 140 mmol/L (ref 134–144)
Total Protein: 6.5 g/dL (ref 6.0–8.5)

## 2018-09-28 LAB — PRO B NATRIURETIC PEPTIDE: NT-Pro BNP: 612 pg/mL — ABNORMAL HIGH (ref 0–210)

## 2018-09-28 MED ORDER — ALBUMIN HUMAN 25 % IV SOLN
INTRAVENOUS | Status: AC
  Filled 2018-09-28: qty 100

## 2018-09-28 MED ORDER — ALBUMIN HUMAN 25 % IV SOLN
25.0000 g | Freq: Once | INTRAVENOUS | Status: AC
  Administered 2018-09-28: 25 g via INTRAVENOUS

## 2018-09-29 NOTE — Telephone Encounter (Signed)
Left message for patient to call back  

## 2018-09-29 NOTE — Telephone Encounter (Signed)
-----   Message from Jerrol Banana., MD sent at 09/28/2018  1:10 PM EDT ----- Labs stable.  Dr. Loletha Grayer, his GI doctor is supposed to call me either this afternoon or tomorrow.

## 2018-10-04 NOTE — Telephone Encounter (Signed)
Patient has already been advised of results.

## 2018-10-13 ENCOUNTER — Ambulatory Visit: Payer: BLUE CROSS/BLUE SHIELD

## 2018-10-19 ENCOUNTER — Ambulatory Visit (INDEPENDENT_AMBULATORY_CARE_PROVIDER_SITE_OTHER): Payer: BLUE CROSS/BLUE SHIELD | Admitting: Family Medicine

## 2018-10-19 ENCOUNTER — Encounter: Payer: Self-pay | Admitting: Family Medicine

## 2018-10-19 ENCOUNTER — Other Ambulatory Visit: Payer: Self-pay

## 2018-10-19 VITALS — BP 120/58 | HR 61 | Temp 98.0°F | Ht 72.0 in | Wt 356.6 lb

## 2018-10-19 DIAGNOSIS — E1169 Type 2 diabetes mellitus with other specified complication: Secondary | ICD-10-CM | POA: Diagnosis not present

## 2018-10-19 DIAGNOSIS — E785 Hyperlipidemia, unspecified: Secondary | ICD-10-CM

## 2018-10-19 DIAGNOSIS — R5383 Other fatigue: Secondary | ICD-10-CM | POA: Diagnosis not present

## 2018-10-19 DIAGNOSIS — K7460 Unspecified cirrhosis of liver: Secondary | ICD-10-CM

## 2018-10-19 DIAGNOSIS — R188 Other ascites: Secondary | ICD-10-CM

## 2018-10-19 DIAGNOSIS — K766 Portal hypertension: Secondary | ICD-10-CM

## 2018-10-19 DIAGNOSIS — Z6841 Body Mass Index (BMI) 40.0 and over, adult: Secondary | ICD-10-CM

## 2018-10-19 DIAGNOSIS — E669 Obesity, unspecified: Secondary | ICD-10-CM

## 2018-10-19 MED ORDER — TRAMADOL HCL 50 MG PO TABS: ORAL_TABLET | ORAL | 1 refills | Status: DC

## 2018-10-19 MED ORDER — SPIRONOLACTONE 100 MG PO TABS: ORAL_TABLET | ORAL | 3 refills | Status: DC

## 2018-10-19 NOTE — Progress Notes (Signed)
Patient: Jesse Zimmerman Male    DOB: 1960/11/28   58 y.o.   MRN: [id number] Visit Date: 10/19/2018  Today's Provider: Wilhemena Durie, MD   Chief Complaint  Patient presents with  . Follow-up    3 weeks   Subjective:     HPI   Follow up for CHF  The patient was last seen for this 3 weeks ago. Changes made at last visit include added Metolazone 2.5 to present meds  He reports good compliance with treatment. He feels that condition is Unchanged. He is not having side effects.   Pt reports he is still having inflammation and pressure on his diaphragm all the way down to his feet.  He reports that his feet are better but they still have some swelling at times.  Pt also states that he has tried the Claritin but he is still coughing and gets wheezing at night time.     He has had Echocardiogram by cardiology which apparently is normal. After that evidently had paracentesis of "4-5 ' pints" of fluid. He felt better for a short time. ------------------------------------------------------------------------------------   Allergies  Allergen Reactions  . Latex     LATEX TAPE  . Pineapple Hives     Current Outpatient Medications:  .  acetaminophen (TYLENOL) 325 MG tablet, Take 650 mg by mouth every 6 (six) hours as needed for mild pain or fever., Disp: , Rfl:  .  Canagliflozin-metFORMIN HCl ER (INVOKAMET XR) 50-1000 MG TB24, Take 1 tablet by mouth daily., Disp: 120 tablet, Rfl: 3 .  furosemide (LASIX) 40 MG tablet, Take 1 tablet (40 mg total) by mouth 2 (two) times daily., Disp: 180 tablet, Rfl: 3 .  hydrocortisone (ANUSOL-HC) 25 MG suppository, Place 1 suppository (25 mg total) rectally at bedtime as needed for hemorrhoids or anal itching., Disp: 12 suppository, Rfl: 5 .  ibuprofen (ADVIL,MOTRIN) 200 MG tablet, Take 200 mg by mouth every 6 (six) hours as needed for moderate pain., Disp: , Rfl:  .  loratadine (CLARITIN) 10 MG tablet, Take 1 tablet (10 mg total) by mouth  daily., Disp: 30 tablet, Rfl: 11 .  metolazone (ZAROXOLYN) 2.5 MG tablet, Take 1 tablet (2.5 mg total) by mouth daily., Disp: 30 tablet, Rfl: 5 .  ondansetron (ZOFRAN) 4 MG tablet, Take 1 tablet (4 mg total) by mouth every 8 (eight) hours as needed for nausea or vomiting., Disp: 45 tablet, Rfl: 1 .  propranolol (INDERAL) 20 MG tablet, Take 20 mg by mouth 2 (two) times daily. , Disp: , Rfl:  .  spironolactone (ALDACTONE) 25 MG tablet, Take 1 tablet (25 mg total) by mouth 2 (two) times daily., Disp: 180 tablet, Rfl: 3 .  tamsulosin (FLOMAX) 0.4 MG CAPS capsule, Take 1 capsule (0.4 mg total) by mouth daily., Disp: 90 capsule, Rfl: 3  Review of Systems  Constitutional: Negative.   HENT: Negative.   Eyes: Negative.   Respiratory: Positive for cough (claritin not working well and not resting well at night because of it) and wheezing (at night).   Cardiovascular: Positive for leg swelling (still having at times).  Gastrointestinal: Negative.   Endocrine: Negative.   Genitourinary: Negative.   Musculoskeletal: Negative.   Skin: Negative.   Allergic/Immunologic: Negative.   Neurological: Negative.   Hematological: Negative.   Psychiatric/Behavioral: Negative.     Social History   Tobacco Use  . Smoking status: Never Smoker  . Smokeless tobacco: Never Used  Substance Use Topics  .  Alcohol use: No      Objective:   BP (!) 120/58 (BP Location: Right Arm, Patient Position: Sitting, Cuff Size: Normal)   Pulse 61   Temp 98 F (36.7 C) (Oral)   Ht 6' (1.829 m)   Wt (!) 356 lb 9.6 oz (161.8 kg)   SpO2 98%   BMI 48.36 kg/m  Vitals:   10/19/18 1534  BP: (!) 120/58  Pulse: 61  Temp: 98 F (36.7 C)  TempSrc: Oral  SpO2: 98%  Weight: (!) 356 lb 9.6 oz (161.8 kg)  Height: 6' (1.829 m)     Physical Exam Vitals signs reviewed.  Constitutional:      Appearance: Normal appearance. He is obese.  HENT:     Head: Normocephalic and atraumatic.     Right Ear: External ear normal.      Left Ear: External ear normal.     Nose: Nose normal.     Mouth/Throat:     Pharynx: Oropharynx is clear.  Eyes:     General: No scleral icterus.    Conjunctiva/sclera: Conjunctivae normal.  Neck:     Comments: No JVD noted. Cardiovascular:     Rate and Rhythm: Normal rate and regular rhythm.     Pulses: Normal pulses.     Heart sounds: Normal heart sounds.  Pulmonary:     Effort: Pulmonary effort is normal.  Abdominal:     General: There is distension.     Palpations: Abdomen is soft.     Tenderness: There is no abdominal tenderness.  Musculoskeletal:     Right lower leg: Edema present.     Left lower leg: Edema present.     Comments: 2+ lower extremity edema which extends well above the knees.  Skin:    General: Skin is warm and dry.  Neurological:     General: No focal deficit present.     Mental Status: He is alert and oriented to person, place, and time. Mental status is at baseline.  Psychiatric:        Mood and Affect: Mood normal.        Behavior: Behavior normal.        Thought Content: Thought content normal.        Judgment: Judgment normal.         Assessment & Plan    1. Cirrhosis of liver with ascites, unspecified hepatic cirrhosis type ([place]) Pt never took metolazone. Will continue lasix and increase Aldactone from 25 mg BID to 107m BID for 1 week then 1043mBID. - CBC with Differential/Platelet - Comprehensive metabolic panel - TSH - Testosterone - VITAMIN D 25 Hydroxy (Vit-D Deficiency, Fractures)  2. Fatigue, unspecified type F/u 1 month. - CBC with Differential/Platelet - Comprehensive metabolic panel - TSH - Testosterone - VITAMIN D 25 Hydroxy (Vit-D Deficiency, Fractures)  3. Diabetes mellitus type 2 in obese (HCC)  - CBC with Differential/Platelet - Comprehensive metabolic panel - TSH - Testosterone - VITAMIN D 25 Hydroxy (Vit-D Deficiency, Fractures)  4. Hyperlipidemia, unspecified hyperlipidemia type  - CBC with  Differential/Platelet - Comprehensive metabolic panel - TSH - Testosterone - VITAMIN D 25 Hydroxy (Vit-D Deficiency, Fractures)  5. Portal hypertension (HCC) W/u per GI. - CBC with Differential/Platelet - Comprehensive metabolic panel - TSH - Testosterone - VITAMIN D 25 Hydroxy (Vit-D Deficiency, Fractures)  6. Class 3 severe obesity due to excess calories with serious comorbidity and body mass index (BMI) of 40.0 to 44.9 in adult ([facility]  Richard Cranford Mon, MD  [facility]

## 2018-10-30 ENCOUNTER — Telehealth: Payer: Self-pay

## 2018-10-30 DIAGNOSIS — E1169 Type 2 diabetes mellitus with other specified complication: Secondary | ICD-10-CM | POA: Diagnosis not present

## 2018-10-30 DIAGNOSIS — R188 Other ascites: Secondary | ICD-10-CM | POA: Diagnosis not present

## 2018-10-30 DIAGNOSIS — K7460 Unspecified cirrhosis of liver: Secondary | ICD-10-CM | POA: Diagnosis not present

## 2018-10-30 DIAGNOSIS — R5383 Other fatigue: Secondary | ICD-10-CM | POA: Diagnosis not present

## 2018-10-30 NOTE — Telephone Encounter (Signed)
Morene from the liver clinic at [facility] had called because patient has upcoming appointment scheduled at there office and they are requesting that we send patients most recent office note and lab results. Morene ask that we fax this to [phone]. KW

## 2018-10-31 DIAGNOSIS — K766 Portal hypertension: Secondary | ICD-10-CM | POA: Diagnosis not present

## 2018-10-31 DIAGNOSIS — R600 Localized edema: Secondary | ICD-10-CM | POA: Diagnosis not present

## 2018-10-31 DIAGNOSIS — R188 Other ascites: Secondary | ICD-10-CM | POA: Diagnosis not present

## 2018-10-31 DIAGNOSIS — K7581 Nonalcoholic steatohepatitis (NASH): Secondary | ICD-10-CM | POA: Diagnosis not present

## 2018-10-31 LAB — CBC WITH DIFFERENTIAL/PLATELET
Basophils Absolute: 0 10*3/uL (ref 0.0–0.2)
Basos: 1 %
EOS (ABSOLUTE): 0.3 10*3/uL (ref 0.0–0.4)
Eos: 6 %
Hematocrit: 29.7 % — ABNORMAL LOW (ref 37.5–51.0)
Hemoglobin: 10.1 g/dL — ABNORMAL LOW (ref 13.0–17.7)
Immature Grans (Abs): 0 10*3/uL (ref 0.0–0.1)
Immature Granulocytes: 0 %
Lymphocytes Absolute: 1.1 10*3/uL (ref 0.7–3.1)
Lymphs: 24 %
MCH: 32.6 pg (ref 26.6–33.0)
MCHC: 34 g/dL (ref 31.5–35.7)
MCV: 96 fL (ref 79–97)
Monocytes Absolute: 0.4 10*3/uL (ref 0.1–0.9)
Monocytes: 8 %
Neutrophils Absolute: 2.9 10*3/uL (ref 1.4–7.0)
Neutrophils: 61 %
Platelets: 55 10*3/uL — CL (ref 150–450)
RBC: 3.1 x10E6/uL — ABNORMAL LOW (ref 4.14–5.80)
RDW: 14.9 % (ref 11.6–15.4)
WBC: 4.7 10*3/uL (ref 3.4–10.8)

## 2018-10-31 LAB — VITAMIN D 25 HYDROXY (VIT D DEFICIENCY, FRACTURES): Vit D, 25-Hydroxy: 16.1 ng/mL — ABNORMAL LOW (ref 30.0–100.0)

## 2018-10-31 LAB — TSH: TSH: 3.4 u[IU]/mL (ref 0.450–4.500)

## 2018-10-31 LAB — COMPREHENSIVE METABOLIC PANEL
ALT: 23 IU/L (ref 0–44)
AST: 43 IU/L — ABNORMAL HIGH (ref 0–40)
Albumin/Globulin Ratio: 0.7 — ABNORMAL LOW (ref 1.2–2.2)
Albumin: 2.6 g/dL — ABNORMAL LOW (ref 3.8–4.9)
Alkaline Phosphatase: 112 IU/L (ref 39–117)
BUN/Creatinine Ratio: 13 (ref 9–20)
BUN: 17 mg/dL (ref 6–24)
Bilirubin Total: 2 mg/dL — ABNORMAL HIGH (ref 0.0–1.2)
CO2: 27 mmol/L (ref 20–29)
Calcium: 8.6 mg/dL — ABNORMAL LOW (ref 8.7–10.2)
Chloride: 103 mmol/L (ref 96–106)
Creatinine, Ser: 1.29 mg/dL — ABNORMAL HIGH (ref 0.76–1.27)
GFR calc Af Amer: 71 mL/min/{1.73_m2} (ref >59)
GFR calc non Af Amer: 61 mL/min/{1.73_m2} (ref >59)
Globulin, Total: 3.9 g/dL (ref 1.5–4.5)
Glucose: 120 mg/dL — ABNORMAL HIGH (ref 65–99)
Potassium: 4 mmol/L (ref 3.5–5.2)
Sodium: 140 mmol/L (ref 134–144)
Total Protein: 6.5 g/dL (ref 6.0–8.5)

## 2018-10-31 LAB — TESTOSTERONE: Testosterone: 108 ng/dL — ABNORMAL LOW (ref 264–916)

## 2018-10-31 NOTE — Telephone Encounter (Signed)
Faxed info to number below.

## 2018-11-02 ENCOUNTER — Ambulatory Visit: Payer: Self-pay | Admitting: Family Medicine

## 2018-11-08 ENCOUNTER — Ambulatory Visit: Payer: BLUE CROSS/BLUE SHIELD | Admitting: Family Medicine

## 2018-11-09 DIAGNOSIS — K7460 Unspecified cirrhosis of liver: Secondary | ICD-10-CM | POA: Diagnosis not present

## 2018-11-09 DIAGNOSIS — K7581 Nonalcoholic steatohepatitis (NASH): Secondary | ICD-10-CM | POA: Diagnosis not present

## 2018-11-20 ENCOUNTER — Other Ambulatory Visit: Payer: Self-pay

## 2018-11-20 ENCOUNTER — Emergency Department
Admission: EM | Admit: 2018-11-20 | Discharge: 2018-11-20 | Disposition: A | Discharge status: Home / Self Care | Payer: BLUE CROSS/BLUE SHIELD | Attending: Emergency Medicine | Admitting: Emergency Medicine

## 2018-11-20 ENCOUNTER — Emergency Department: Payer: BLUE CROSS/BLUE SHIELD

## 2018-11-20 ENCOUNTER — Encounter: Payer: Self-pay | Admitting: Emergency Medicine

## 2018-11-20 DIAGNOSIS — Z20828 Contact with and (suspected) exposure to other viral communicable diseases: Secondary | ICD-10-CM | POA: Diagnosis not present

## 2018-11-20 DIAGNOSIS — E785 Hyperlipidemia, unspecified: Secondary | ICD-10-CM | POA: Insufficient documentation

## 2018-11-20 DIAGNOSIS — Z79899 Other long term (current) drug therapy: Secondary | ICD-10-CM | POA: Diagnosis not present

## 2018-11-20 DIAGNOSIS — R509 Fever, unspecified: Secondary | ICD-10-CM | POA: Diagnosis not present

## 2018-11-20 DIAGNOSIS — J029 Acute pharyngitis, unspecified: Secondary | ICD-10-CM | POA: Diagnosis not present

## 2018-11-20 DIAGNOSIS — R112 Nausea with vomiting, unspecified: Secondary | ICD-10-CM | POA: Diagnosis not present

## 2018-11-20 DIAGNOSIS — R059 Cough, unspecified: Secondary | ICD-10-CM

## 2018-11-20 DIAGNOSIS — R05 Cough: Secondary | ICD-10-CM | POA: Diagnosis not present

## 2018-11-20 DIAGNOSIS — E119 Type 2 diabetes mellitus without complications: Secondary | ICD-10-CM | POA: Diagnosis not present

## 2018-11-20 LAB — CBC WITH DIFFERENTIAL/PLATELET
Abs Immature Granulocytes: 0.07 10*3/uL (ref 0.00–0.07)
Basophils Absolute: 0.1 10*3/uL (ref 0.0–0.1)
Basophils Relative: 0 %
Eosinophils Absolute: 0.1 10*3/uL (ref 0.0–0.5)
Eosinophils Relative: 1 %
HCT: 28 % — ABNORMAL LOW (ref 39.0–52.0)
Hemoglobin: 10 g/dL — ABNORMAL LOW (ref 13.0–17.0)
Immature Granulocytes: 1 %
Lymphocytes Relative: 4 %
Lymphs Abs: 0.5 10*3/uL — ABNORMAL LOW (ref 0.7–4.0)
MCH: 33.3 pg (ref 26.0–34.0)
MCHC: 35.7 g/dL (ref 30.0–36.0)
MCV: 93.3 fL (ref 80.0–100.0)
Monocytes Absolute: 1 10*3/uL (ref 0.1–1.0)
Monocytes Relative: 7 %
Neutro Abs: 12.2 10*3/uL — ABNORMAL HIGH (ref 1.7–7.7)
Neutrophils Relative %: 87 %
Platelets: 55 10*3/uL — ABNORMAL LOW (ref 150–400)
RBC: 3 MIL/uL — ABNORMAL LOW (ref 4.22–5.81)
RDW: 14.4 % (ref 11.5–15.5)
WBC: 14 10*3/uL — ABNORMAL HIGH (ref 4.0–10.5)
nRBC: 0 % (ref 0.0–0.2)

## 2018-11-20 LAB — COMPREHENSIVE METABOLIC PANEL
ALT: 28 U/L (ref 0–44)
AST: 47 U/L — ABNORMAL HIGH (ref 15–41)
Albumin: 3 g/dL — ABNORMAL LOW (ref 3.5–5.0)
Alkaline Phosphatase: 143 U/L — ABNORMAL HIGH (ref 38–126)
Anion gap: 9 (ref 5–15)
BUN: 24 mg/dL — ABNORMAL HIGH (ref 6–20)
CO2: 23 mmol/L (ref 22–32)
Calcium: 8.3 mg/dL — ABNORMAL LOW (ref 8.9–10.3)
Chloride: 100 mmol/L (ref 98–111)
Creatinine, Ser: 1.36 mg/dL — ABNORMAL HIGH (ref 0.61–1.24)
GFR calc Af Amer: >60 mL/min (ref >60)
GFR calc non Af Amer: 57 mL/min — ABNORMAL LOW (ref >60)
Glucose, Bld: 182 mg/dL — ABNORMAL HIGH (ref 70–99)
Potassium: 4.4 mmol/L (ref 3.5–5.1)
Sodium: 132 mmol/L — ABNORMAL LOW (ref 135–145)
Total Bilirubin: 3.8 mg/dL — ABNORMAL HIGH (ref 0.3–1.2)
Total Protein: 7.2 g/dL (ref 6.5–8.1)

## 2018-11-20 LAB — TROPONIN I: Troponin I: <0.03 ng/mL (ref <0.03)

## 2018-11-20 LAB — SARS CORONAVIRUS 2 BY RT PCR (HOSPITAL ORDER, PERFORMED IN ~~LOC~~ HOSPITAL LAB): SARS Coronavirus 2: NEGATIVE

## 2018-11-20 MED ORDER — SODIUM CHLORIDE 0.9 % IV BOLUS
1000.0000 mL | Freq: Once | INTRAVENOUS | Status: AC
  Administered 2018-11-20: 1000 mL via INTRAVENOUS

## 2018-11-20 MED ORDER — BENZONATATE 100 MG PO CAPS: 100.0000 mg | ORAL_CAPSULE | Freq: Three times a day (TID) | ORAL | 0 refills | Status: DC | PRN

## 2018-11-20 MED ORDER — ONDANSETRON 4 MG PO TBDP: 4.0000 mg | ORAL_TABLET | Freq: Three times a day (TID) | ORAL | 0 refills | Status: DC | PRN

## 2018-11-20 MED ORDER — ONDANSETRON HCL 4 MG/2ML IJ SOLN
4.0000 mg | Freq: Once | INTRAMUSCULAR | Status: AC
  Administered 2018-11-20: 4 mg via INTRAVENOUS
  Filled 2018-11-20: qty 2

## 2018-11-20 MED ORDER — PENICILLIN V POTASSIUM 500 MG PO TABS: 500.0000 mg | ORAL_TABLET | Freq: Once | ORAL | Status: DC

## 2018-11-20 MED ORDER — PENICILLIN V POTASSIUM 500 MG PO TABS: 500.0000 mg | ORAL_TABLET | Freq: Two times a day (BID) | ORAL | 0 refills | Status: DC

## 2018-11-20 MED ORDER — BENZONATATE 100 MG PO CAPS: 100.0000 mg | ORAL_CAPSULE | Freq: Once | ORAL | Status: DC

## 2018-11-20 MED ORDER — ONDANSETRON HCL 4 MG/2ML IJ SOLN: 4.0000 mg | Freq: Once | INTRAMUSCULAR | Status: DC

## 2018-11-20 NOTE — ED Notes (Signed)
Pt given water to drink. 

## 2018-11-20 NOTE — ED Notes (Addendum)
Pt c/o cough since this am - c/o runny nose and sore throat - no Covid exposure - pt has cirrhosis of the liver

## 2018-11-20 NOTE — Discharge Instructions (Signed)
Use the Zofran melt on your tongue wafers 1 3 times a day as needed for nausea and swallow 1 Tessalon Perles 3 times a day as needed for cough.  Please return for worsening or inability to keep down fluids.  Also return for higher fever or shortness of breath or feeling sicker at all.  Please follow-up with your regular doctor in the next few days.  Since you have a mild fever and the sore throat I will give you some penicillin just in case.  Take 1 pill twice a day.

## 2018-11-20 NOTE — ED Triage Notes (Signed)
Cough, fever, nausea with vomiting since this am.

## 2018-11-20 NOTE — ED Provider Notes (Addendum)
[facility] Emergency Department Provider Note   ____________________________________________   First MD Initiated Contact with Patient 11/20/18 1545     (approximate)  I have reviewed the triage vital signs and the nursing notes.   HISTORY  Chief Complaint Cough and Emesis   HPI Jesse Zimmerman is a 58 y.o. male patient reports he was fine until this morning when he started vomiting and began coughing.  He is vomiting intractably.  He denies any chest pain belly pain or shortness of breath.  He does have a low-grade fever of 100.7.  He has a history of cirrhosis of the liver secondary to nonalcoholic steatosis he also has a history of gastric ulcer and ascites and leg edema with portal hypertension and esophageal varices        Past Medical History:  Diagnosis Date  . Chronic back pain   . Cirrhosis ([place])    NASH  . Diabetes mellitus without complication ([place])   . Esophageal varices ([place])   . Hyperlipidemia   . NASH (nonalcoholic steatohepatitis)     Patient Active Problem List   Diagnosis Date Noted  . Melena   . Acute gastric ulcer with hemorrhage   . Acute gastritis without hemorrhage   . GI bleed 07/17/2018  . Blood in stool   . Secondary esophageal varices without bleeding ([place])   . Portal hypertension ([place])   . Upper GI bleed 04/17/2017  . Contusion, back 12/26/2014  . Bruising 12/26/2014  . Back pain, chronic 11/22/2014  . Diabetes mellitus type 2 in obese ([place]) 11/22/2014  . 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 Acid reflux 11/22/2014  . HLD (hyperlipidemia) 11/22/2014  . BP (high blood pressure) 11/22/2014  . 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 Cannot sleep 11/22/2014  . Cirrhosis ([place]) 11/22/2014  . Adiposity 11/22/2014  . Change in blood platelet count 11/22/2014  . Avitaminosis D 11/22/2014  . Encephalopathy, hepatic ([place]) 05/23/2012  . 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 COLONOSCOPY WITH PROPOFOL N/A 08/12/2017   Procedure: COLONOSCOPY WITH PROPOFOL;  Surgeon: Jonathon Bellows, MD;  Location: [facility] ENDOSCOPY;  Service: Gastroenterology;  Laterality: N/A;  . ESOPHAGOGASTRODUODENOSCOPY (EGD) WITH PROPOFOL N/A 04/18/2017   Procedure: ESOPHAGOGASTRODUODENOSCOPY (EGD) WITH PROPOFOL;  Surgeon: [name], MD;  Location: [facility] ENDOSCOPY;  Service: Endoscopy;  Laterality: N/A;  . ESOPHAGOGASTRODUODENOSCOPY (EGD) WITH PROPOFOL N/A 07/18/2018   Procedure: ESOPHAGOGASTRODUODENOSCOPY (EGD) WITH PROPOFOL;  Surgeon: [name], MD;  Location[facility] END[facility]COPY;  Service: Endoscopy;  Laterality: N/A;  . EXCESSIVE THIGH / HIP / BUTTOCK / FLANK SKIN EXCISION     PART OF INNER THIGH/DUE TO SEPSIS INFECTION REMOVED  . KIDNEY STONE SURGERY     removed  . TONSILLECTOMY AND ADENOIDECTOMY      Prior to Admission medications   Medication Sig Start Date End Date Taking? Authorizing Provider  acetaminophen (TYLENOL) 325 MG tablet Take 650 mg by mouth every 6 (six) hours as needed for mild pain or fever.    [provider]  benzonatate (TESSALON PERLES) 100 MG capsule Take 1 capsule (100 mg total) by mouth 3 (three) times daily as needed for cough. 11/20/18 11/20/19  Nena Polio, MD  Canagliflozin-metFORMIN HCl ER (INVOKAMET XR) 50-1000 MG TB24 Take 1 tablet by mouth daily. 09/06/18   Jerrol Banana., MD  furosemide (LASIX) 40 MG tablet Take 1 tablet (40 mg total) by mouth 2 (two)  times daily. 09/14/18   Jerrol Banana., MD  hydrocortisone (ANUSOL-HC) 25 MG suppository Place 1 suppository (25 mg total) rectally at bedtime as needed for hemorrhoids or anal itching. 12/21/17   Jerrol Banana., MD  ibuprofen (ADVIL,MOTRIN) 200 MG tablet Take 200 mg by mouth every 6 (six) hours as needed for moderate pain.    [provider]  loratadine (CLARITIN) 10 MG tablet Take 1 tablet (10 mg total) by mouth daily. 09/20/18    Jerrol Banana., MD  metolazone (ZAROXOLYN) 2.5 MG tablet Take 1 tablet (2.5 mg total) by mouth daily. 09/20/18   Jerrol Banana., MD  ondansetron (ZOFRAN ODT) 4 MG disintegrating tablet Take 1 tablet (4 mg total) by mouth every 8 (eight) hours as needed for nausea or vomiting. 11/20/18   Nena Polio, MD  ondansetron (ZOFRAN) 4 MG tablet Take 1 tablet (4 mg total) by mouth every 8 (eight) hours as needed for nausea or vomiting. 12/21/17   Jerrol Banana., MD  penicillin v potassium (VEETID) 500 MG tablet Take 1 tablet (500 mg total) by mouth 2 (two) times a day. 11/20/18   Nena Polio, MD  propranolol (INDERAL) 20 MG tablet Take 20 mg by mouth 2 (two) times daily.     [provider]  spironolactone (ALDACTONE) 100 MG tablet Take 50 mg BID for 1 week and then increase to 100 mg BID 10/19/18   Jerrol Banana., MD  spironolactone (ALDACTONE) 25 MG tablet Take 1 tablet (25 mg total) by mouth 2 (two) times daily. 09/14/18   Jerrol Banana., MD  tamsulosin (FLOMAX) 0.4 MG CAPS capsule Take 1 capsule (0.4 mg total) by mouth daily. 05/11/18   Jerrol Banana., MD  traMADol Veatrice [place]bon) 50 MG tablet Take 1 to 2 tablets every 8 hours and as needed for pain 10/19/18   Jerrol Banana., MD    Allergies Latex and Pineapple  Family History  Problem Relation Age of Onset  . Hypertension Mother   . Breast cancer Mother   . Dementia Mother   . Heart attack Father   . Gout Father   . Hypertension Father   . Hypertension Sister   . Hypertension Sister   . Colon cancer Maternal Aunt   . Colon cancer Maternal Grandmother   . Colon cancer Paternal Grandmother     Social History Social History   Tobacco Use  . Smoking status: Never Smoker  . Smokeless tobacco: Never Used  Substance Use Topics  . Alcohol use: No  . Drug use: No    Review of Systems  Constitutional:  Fever no chills Eyes: No visual changes. ENT: No sore throat. Cardiovascular:  Denies chest pain. Respiratory: Denies shortness of breath. Gastrointestinal: No abdominal pain.  No nausea, no vomiting.  No diarrhea.  No constipation. Genitourinary: Negative for dysuria. Musculoskeletal: Negative for back pain. Skin: Negative for rash. Neurological: Negative for headaches, focal weakness   ____________________________________________   PHYSICAL EXAM:  VITAL SIGNS: ED Triage Vitals [11/20/18 1530]  Enc Vitals Group     BP (!) 144/104     Pulse Rate 77     Resp 20     Temp (!) 100.7 F (38.2 C)     Temp Source Oral     SpO2 97 %     Weight (!) 314 lb (142.4 kg)     Height 6' (1.829 m)     Head Circumference  Peak Flow      Pain Score 0     Pain Loc      Pain Edu?      Excl. in [place]?     Constitutional: Alert and oriented. Well appearing and in no acute distress. Eyes: Conjunctivae are normal.  Head: Atraumatic. Nose: No congestion/rhinnorhea. Mouth/Throat: Mucous membranes are moist.  Oropharynx non-erythematous. Neck: No stridor.  Cardiovascular: Normal rate, regular rhythm. Grossly normal heart sounds.  Good peripheral circulation. Respiratory: Normal respiratory effort.  No retractions. Lungs CTAB. Gastrointestinal: Soft and nontender. No distention. No abdominal bruits. No CVA tenderness. Musculoskeletal: No lower extremity tenderness nor edema.   Neurologic:  Normal speech and language. No gross focal neurologic deficits are appreciated. Skin:  Skin is warm, dry and intact. No rash noted.  ____________________________________________   LABS (all labs ordered are listed, but only abnormal results are displayed)  Labs Reviewed  COMPREHENSIVE METABOLIC PANEL - Abnormal; Notable for the following components:      Result Value   Sodium 132 (*)    Glucose, Bld 182 (*)    BUN 24 (*)    Creatinine, Ser 1.36 (*)    Calcium 8.3 (*)    Albumin 3.0 (*)    AST 47 (*)    Alkaline Phosphatase 143 (*)    Total Bilirubin 3.8 (*)    GFR calc non  Af Amer 57 (*)    All other components within normal limits  CBC WITH DIFFERENTIAL/PLATELET - Abnormal; Notable for the following components:   WBC 14.0 (*)    RBC 3.00 (*)    Hemoglobin 10.0 (*)    HCT 28.0 (*)    Platelets 55 (*)    Neutro Abs 12.2 (*)    Lymphs Abs 0.5 (*)    All other components within normal limits  SARS CORONAVIRUS 2 (HOSPITAL ORDER, [place] [facility])  TROPONIN I   ____________________________________________  EKG EKG read and interpreted by me shows normal sinus rhythm rate of 80 left axis no acute ST-T wave changes decreased amplitude in the chest leads with 1 PVC.  ____________________________________________  RADIOLOGY  ED MD interpretation: Acute abdominal series read by radiology reviewed by me shows no acute pathology  Official radiology report(s): Dg Abdomen Acute W/chest  Result Date: 11/20/2018 CLINICAL DATA:  Cough this morning with runny nose and sore throat. History of cirrhosis. EXAM: DG ABDOMEN ACUTE W/ 1V CHEST COMPARISON:  Chest radiographs 11/03/2016.  Abdominal CT 06/13/2013. FINDINGS: Low lung volumes. The heart size and mediastinal contours are stable. The lungs are clear. There is no pleural effusion or pneumothorax. The abdomen is relatively gasless. There is no evidence of bowel obstruction or free intraperitoneal air. Previously demonstrated bilateral renal calculi are not well visualized. No acute osseous findings. IMPRESSION: No evidence of acute cardiopulmonary or abdominal process. Electronically Signed   By: Richardean Sale M.D.   On: 11/20/2018 16:59    ____________________________________________   PROCEDURES  Procedure(s) performed (including Critical Care):  Procedures   ____________________________________________   INITIAL IMPRESSION / ASSESSMENT AND PLAN / ED COURSE  Patient's coughing and vomiting have resolved.  He feels better.  I will give him some Zofran and Tessalon Perles to to use at  home just in case.  He will return for any further problems.  He did have a sore throat as well.  Because he has cirrhosis I will give him some penicillin in case there is any strep.  ____________________________________________   FINAL CLINICAL IMPRESSION(S) / ED DIAGNOSES  Final diagnoses:  Cough  Non-intractable vomiting with nausea, unspecified vomiting type     ED Discharge Orders         Ordered    ondansetron (ZOFRAN ODT) 4 MG disintegrating tablet  Every 8 hours PRN     11/20/18 2033    benzonatate (TESSALON PERLES) 100 MG capsule  3 times daily PRN     11/20/18 2033    penicillin v potassium (VEETID) 500 MG tablet  2 times daily     11/20/18 2036           Note:  This document was prepared using Dragon voice recognition software and may include unintentional dictation errors.    Nena Polio, MD 11/20/18 2152    Nena Polio, MD 11/20/18 2152

## 2018-11-20 NOTE — ED Notes (Signed)
ED notified of lab results

## 2018-11-23 ENCOUNTER — Other Ambulatory Visit: Payer: Self-pay

## 2018-11-23 ENCOUNTER — Ambulatory Visit (INDEPENDENT_AMBULATORY_CARE_PROVIDER_SITE_OTHER): Payer: BC Managed Care – PPO | Admitting: Family Medicine

## 2018-11-23 ENCOUNTER — Encounter: Payer: Self-pay | Admitting: Family Medicine

## 2018-11-23 VITALS — BP 116/70 | HR 70 | Temp 97.3°F | Resp 16 | Wt 311.2 lb

## 2018-11-23 DIAGNOSIS — E669 Obesity, unspecified: Secondary | ICD-10-CM

## 2018-11-23 DIAGNOSIS — K7469 Other cirrhosis of liver: Secondary | ICD-10-CM | POA: Diagnosis not present

## 2018-11-23 DIAGNOSIS — K766 Portal hypertension: Secondary | ICD-10-CM

## 2018-11-23 DIAGNOSIS — Z6841 Body Mass Index (BMI) 40.0 and over, adult: Secondary | ICD-10-CM

## 2018-11-23 DIAGNOSIS — E1169 Type 2 diabetes mellitus with other specified complication: Secondary | ICD-10-CM | POA: Diagnosis not present

## 2018-11-23 DIAGNOSIS — I493 Ventricular premature depolarization: Secondary | ICD-10-CM

## 2018-11-23 NOTE — Progress Notes (Addendum)
Patient: Jesse Zimmerman Male    DOB: 1961/04/14   58 y.o.   MRN: [id number] Visit Date: 11/23/2018  Today's Provider: Wilhemena Durie, MD   Chief Complaint  Patient presents with  . Cirrhosis  . Hospitalization Follow-up   Subjective:     HPI   Follow up for Cirrhosis of liver with ascites  The patient was last seen for this 2 months ago. Changes made at last visit include continuing Lasix and increasing Aldactone to 31m BID for one week then 1057mBID.  He reports excellent compliance with treatment. He feels that condition is Unchanged. He is not having side effects.   Pt has lost 40lbs since his last visit. ------------------------------------------------------------------------------------  Follow up ER visit  Patient was seen in ER for nausea and vomiting on 11/20/2018. He was treated for cough and non intractable vomiting with nausea. Treatment for this included. Zofran, Tessalon and Veetid were prescribed He reports good compliance with treatment. He reports this condition is Unchanged.  ------------------------------------------------------------------------------------ He is feeling a lot better. He has noted and occasional but not infrequent irregular heart beat recently.No syncope or presyncope.   Allergies  Allergen Reactions  . Latex     LATEX TAPE  . Pineapple Hives     Current Outpatient Medications:  .  acetaminophen (TYLENOL) 325 MG tablet, Take 650 mg by mouth every 6 (six) hours as needed for mild pain or fever., Disp: , Rfl:  .  benzonatate (TESSALON PERLES) 100 MG capsule, Take 1 capsule (100 mg total) by mouth 3 (three) times daily as needed for cough., Disp: 30 capsule, Rfl: 0 .  Canagliflozin-metFORMIN HCl ER (INVOKAMET XR) 50-1000 MG TB24, Take 1 tablet by mouth daily., Disp: 120 tablet, Rfl: 3 .  furosemide (LASIX) 40 MG tablet, Take 1 tablet (40 mg total) by mouth 2 (two) times daily., Disp: 180 tablet, Rfl: 3 .   hydrocortisone (ANUSOL-HC) 25 MG suppository, Place 1 suppository (25 mg total) rectally at bedtime as needed for hemorrhoids or anal itching., Disp: 12 suppository, Rfl: 5 .  ibuprofen (ADVIL,MOTRIN) 200 MG tablet, Take 200 mg by mouth every 6 (six) hours as needed for moderate pain., Disp: , Rfl:  .  loratadine (CLARITIN) 10 MG tablet, Take 1 tablet (10 mg total) by mouth daily., Disp: 30 tablet, Rfl: 11 .  metolazone (ZAROXOLYN) 2.5 MG tablet, Take 1 tablet (2.5 mg total) by mouth daily., Disp: 30 tablet, Rfl: 5 .  ondansetron (ZOFRAN ODT) 4 MG disintegrating tablet, Take 1 tablet (4 mg total) by mouth every 8 (eight) hours as needed for nausea or vomiting., Disp: 20 tablet, Rfl: 0 .  ondansetron (ZOFRAN) 4 MG tablet, Take 1 tablet (4 mg total) by mouth every 8 (eight) hours as needed for nausea or vomiting., Disp: 45 tablet, Rfl: 1 .  penicillin v potassium (VEETID) 500 MG tablet, Take 1 tablet (500 mg total) by mouth 2 (two) times a day., Disp: 20 tablet, Rfl: 0 .  propranolol (INDERAL) 20 MG tablet, Take 20 mg by mouth 2 (two) times daily. , Disp: , Rfl:  .  spironolactone (ALDACTONE) 100 MG tablet, Take 50 mg BID for 1 week and then increase to 100 mg BID, Disp: 180 tablet, Rfl: 3 .  spironolactone (ALDACTONE) 25 MG tablet, Take 1 tablet (25 mg total) by mouth 2 (two) times daily., Disp: 180 tablet, Rfl: 3 .  tamsulosin (FLOMAX) 0.4 MG CAPS capsule, Take 1 capsule (0.4 mg total) by mouth  daily., Disp: 90 capsule, Rfl: 3 .  traMADol (ULTRAM) 50 MG tablet, Take 1 to 2 tablets every 8 hours and as needed for pain, Disp: 100 tablet, Rfl: 1  Review of Systems  Constitutional: Positive for appetite change, fatigue and unexpected weight change.  HENT: Positive for ear pain (patient desribes as a pressure more so on the left side than right), postnasal drip, rhinorrhea, sinus pressure and sinus pain.   Cardiovascular: Positive for leg swelling. Negative for chest pain and palpitations.   Gastrointestinal: Positive for diarrhea, nausea and vomiting.  Genitourinary: Positive for frequency.  Musculoskeletal: Positive for arthralgias and joint swelling.  Allergic/Immunologic: Negative.   Neurological: Positive for dizziness and light-headedness.  Hematological: Negative.   Psychiatric/Behavioral: Negative.     Social History   Tobacco Use  . Smoking status: Never Smoker  . Smokeless tobacco: Never Used  Substance Use Topics  . Alcohol use: No      Objective:   BP 116/70   Pulse 70   Temp (!) 97.3 F (36.3 C) (Oral)   Resp 16   Wt (!) 311 lb 3.2 oz (141.2 kg)   BMI 42.21 kg/m  Vitals:   11/23/18 1615  BP: 116/70  Pulse: 70  Resp: 16  Temp: (!) 97.3 F (36.3 C)  TempSrc: Oral  Weight: (!) 311 lb 3.2 oz (141.2 kg)     Physical Exam Vitals signs reviewed.  Constitutional:      Appearance: Normal appearance. He is obese.  HENT:     Head: Normocephalic and atraumatic.     Right Ear: External ear normal.     Left Ear: External ear normal.     Nose: Nose normal.     Mouth/Throat:     Pharynx: Oropharynx is clear.  Eyes:     General: No scleral icterus.    Conjunctiva/sclera: Conjunctivae normal.  Neck:     Comments: No JVD noted. Cardiovascular:     Rate and Rhythm: Normal rate and regular rhythm.     Pulses: Normal pulses.     Heart sounds: Normal heart sounds.  Pulmonary:     Effort: Pulmonary effort is normal.  Abdominal:     Palpations: Abdomen is soft.     Tenderness: There is no abdominal tenderness.  Musculoskeletal:     Comments: Trace edema.  Skin:    General: Skin is warm and dry.  Neurological:     General: No focal deficit present.     Mental Status: He is alert and oriented to person, place, and time. Mental status is at baseline.  Psychiatric:        Mood and Affect: Mood normal.        Behavior: Behavior normal.        Thought Content: Thought content normal.        Judgment: Judgment normal.   ECG reveals occasional  PVC.      Assessment & Plan    1. Portal hypertension (HCC) RTC 2-3 months. Now on Propanalol and Aldactone along with lasix. Cut lasix back some. 2. Other cirrhosis of liver (HCC) NASH  3. Diabetes mellitus type 2 in obese (HCC)   4. Class 3 severe obesity due to excess calories with serious comorbidity and body mass index (BMI) of 40.0 to 44.9 in adult [facility]) With NASH/DM 5.PVC     Cranford Mon, MD  [employer] Fritzi Mandes [place] as a scribe for Wilhemena Durie, MD.,have documented all relevant documentation on  the behalf of Wilhemena Durie, MD,as directed by  Wilhemena Durie, MD while in the presence of Wilhemena Durie, MD.

## 2018-11-29 DIAGNOSIS — K766 Portal hypertension: Secondary | ICD-10-CM | POA: Diagnosis not present

## 2018-11-29 DIAGNOSIS — N200 Calculus of kidney: Secondary | ICD-10-CM | POA: Diagnosis not present

## 2018-11-29 DIAGNOSIS — R161 Splenomegaly, not elsewhere classified: Secondary | ICD-10-CM | POA: Diagnosis not present

## 2018-11-29 DIAGNOSIS — K7469 Other cirrhosis of liver: Secondary | ICD-10-CM | POA: Diagnosis not present

## 2018-12-07 DIAGNOSIS — K7460 Unspecified cirrhosis of liver: Secondary | ICD-10-CM | POA: Diagnosis not present

## 2018-12-07 DIAGNOSIS — K7581 Nonalcoholic steatohepatitis (NASH): Secondary | ICD-10-CM | POA: Diagnosis not present

## 2018-12-21 ENCOUNTER — Ambulatory Visit: Payer: Self-pay | Admitting: Family Medicine

## 2019-01-04 DIAGNOSIS — E119 Type 2 diabetes mellitus without complications: Secondary | ICD-10-CM | POA: Diagnosis not present

## 2019-01-04 DIAGNOSIS — E785 Hyperlipidemia, unspecified: Secondary | ICD-10-CM | POA: Diagnosis not present

## 2019-01-04 DIAGNOSIS — K766 Portal hypertension: Secondary | ICD-10-CM | POA: Diagnosis not present

## 2019-01-24 ENCOUNTER — Other Ambulatory Visit: Payer: Self-pay

## 2019-01-24 ENCOUNTER — Ambulatory Visit (INDEPENDENT_AMBULATORY_CARE_PROVIDER_SITE_OTHER): Payer: BC Managed Care – PPO | Admitting: Family Medicine

## 2019-01-24 DIAGNOSIS — R5383 Other fatigue: Secondary | ICD-10-CM | POA: Diagnosis not present

## 2019-01-24 DIAGNOSIS — R05 Cough: Secondary | ICD-10-CM

## 2019-01-24 DIAGNOSIS — R059 Cough, unspecified: Secondary | ICD-10-CM

## 2019-01-24 NOTE — Progress Notes (Signed)
Virtual Visit via Telephone Note  I connected with Jesse Zimmerman on 01/24/19 at  1:20 PM EDT by telephone and verified that I am speaking with the correct person using two identifiers.  Location: Patient: Work Provider: Office   I discussed the limitations, risks, security and privacy concerns of performing an evaluation and management service by telephone and the availability of in person appointments. I also discussed with the patient that there may be a patient responsible charge related to this service. The patient expressed understanding and agreed to proceed.   History of Present Illness: Pt has 3 days of cough and fatigue. 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   Assessment and Plan: 1. Cough Pt to let us know if he worsens. - Novel Coronavirus, NAA ([facility])  2. Fatigue, unspecified type  - Novel Coronavirus, NAA ([facility])   Follow Up Instructions:    I discussed the assessment and treatment plan with the patient. The patient was provided an opportunity to ask questions and all were answered. The patient agreed with the plan and demonstrated an understanding of the instructions.   The patient was advised to call back or seek an in-person evaluation if the symptoms worsen or if the condition fails to improve as anticipated.  I provided 4 minutes of non-face-to-face time during this encounter.   Wilhemena Durie, MD

## 2019-01-27 DIAGNOSIS — Z20828 Contact with and (suspected) exposure to other viral communicable diseases: Secondary | ICD-10-CM | POA: Diagnosis not present

## 2019-02-13 DIAGNOSIS — K7581 Nonalcoholic steatohepatitis (NASH): Secondary | ICD-10-CM | POA: Diagnosis not present

## 2019-02-13 DIAGNOSIS — K7460 Unspecified cirrhosis of liver: Secondary | ICD-10-CM | POA: Diagnosis not present

## 2019-02-19 DIAGNOSIS — R7989 Other specified abnormal findings of blood chemistry: Secondary | ICD-10-CM | POA: Diagnosis not present

## 2019-02-19 DIAGNOSIS — K7460 Unspecified cirrhosis of liver: Secondary | ICD-10-CM | POA: Diagnosis not present

## 2019-02-19 DIAGNOSIS — K766 Portal hypertension: Secondary | ICD-10-CM | POA: Diagnosis not present

## 2019-02-19 DIAGNOSIS — I8500 Esophageal varices without bleeding: Secondary | ICD-10-CM | POA: Diagnosis not present

## 2019-02-19 DIAGNOSIS — K7581 Nonalcoholic steatohepatitis (NASH): Secondary | ICD-10-CM | POA: Diagnosis not present

## 2019-02-19 DIAGNOSIS — Z01812 Encounter for preprocedural laboratory examination: Secondary | ICD-10-CM | POA: Diagnosis not present

## 2019-03-06 ENCOUNTER — Encounter: Payer: Self-pay | Admitting: Family Medicine

## 2019-03-06 ENCOUNTER — Other Ambulatory Visit: Payer: Self-pay | Admitting: *Deleted

## 2019-03-06 ENCOUNTER — Other Ambulatory Visit: Payer: Self-pay

## 2019-03-06 ENCOUNTER — Ambulatory Visit (INDEPENDENT_AMBULATORY_CARE_PROVIDER_SITE_OTHER): Payer: BC Managed Care – PPO | Admitting: Family Medicine

## 2019-03-06 VITALS — BP 131/78 | HR 76 | Temp 97.6°F | Resp 18 | Wt 307.0 lb

## 2019-03-06 DIAGNOSIS — R531 Weakness: Secondary | ICD-10-CM

## 2019-03-06 DIAGNOSIS — K7460 Unspecified cirrhosis of liver: Secondary | ICD-10-CM

## 2019-03-06 DIAGNOSIS — R059 Cough, unspecified: Secondary | ICD-10-CM

## 2019-03-06 DIAGNOSIS — Z20822 Contact with and (suspected) exposure to covid-19: Secondary | ICD-10-CM

## 2019-03-06 DIAGNOSIS — J40 Bronchitis, not specified as acute or chronic: Secondary | ICD-10-CM

## 2019-03-06 DIAGNOSIS — R05 Cough: Secondary | ICD-10-CM

## 2019-03-06 DIAGNOSIS — I10 Essential (primary) hypertension: Secondary | ICD-10-CM | POA: Diagnosis not present

## 2019-03-06 DIAGNOSIS — J440 Chronic obstructive pulmonary disease with acute lower respiratory infection: Secondary | ICD-10-CM | POA: Diagnosis not present

## 2019-03-06 DIAGNOSIS — R6889 Other general symptoms and signs: Secondary | ICD-10-CM | POA: Diagnosis not present

## 2019-03-06 DIAGNOSIS — R188 Other ascites: Secondary | ICD-10-CM

## 2019-03-06 DIAGNOSIS — R5383 Other fatigue: Secondary | ICD-10-CM

## 2019-03-06 DIAGNOSIS — R42 Dizziness and giddiness: Secondary | ICD-10-CM

## 2019-03-06 DIAGNOSIS — E669 Obesity, unspecified: Secondary | ICD-10-CM

## 2019-03-06 DIAGNOSIS — E1169 Type 2 diabetes mellitus with other specified complication: Secondary | ICD-10-CM

## 2019-03-06 DIAGNOSIS — J209 Acute bronchitis, unspecified: Secondary | ICD-10-CM

## 2019-03-06 MED ORDER — AZITHROMYCIN 250 MG PO TABS: 250.0000 mg | ORAL_TABLET | Freq: Every day | ORAL | 0 refills | Status: DC

## 2019-03-06 MED ORDER — BLOOD GLUCOSE METER KIT: PACK | 0 refills | Status: DC

## 2019-03-06 MED ORDER — MECLIZINE HCL 25 MG PO TABS: 25.0000 mg | ORAL_TABLET | Freq: Three times a day (TID) | ORAL | 1 refills | Status: DC | PRN

## 2019-03-06 MED ORDER — PROMETHAZINE HCL 25 MG PO TABS: 25.0000 mg | ORAL_TABLET | Freq: Three times a day (TID) | ORAL | 1 refills | Status: DC | PRN

## 2019-03-06 NOTE — Progress Notes (Signed)
Patient: Jesse Zimmerman Male    DOB: [birth year]   59 y.o.   MRN: [id number] Visit Date: 03/06/2019  Today's Provider: Wilhemena Durie, MD   Chief Complaint  Patient presents with  . Cough   Subjective:     HPI  Patient has been experiencing ear fullness in the left ear, fever, fatigue, nausea, and dizziness that started a few weeks ago. Dry cough, drainage, and sinus congestion for 2 to 3 months. Medication refill on Ondansetron.  Allergies  Allergen Reactions  . Latex     LATEX TAPE  . Pineapple Hives     Current Outpatient Medications:  .  acetaminophen (TYLENOL) 325 MG tablet, Take 650 mg by mouth every 6 (six) hours as needed for mild pain or fever., Disp: , Rfl:  .  benzonatate (TESSALON PERLES) 100 MG capsule, Take 1 capsule (100 mg total) by mouth 3 (three) times daily as needed for cough., Disp: 30 capsule, Rfl: 0 .  Canagliflozin-metFORMIN HCl ER (INVOKAMET XR) 50-1000 MG TB24, Take 1 tablet by mouth daily., Disp: 120 tablet, Rfl: 3 .  furosemide (LASIX) 40 MG tablet, Take 1 tablet (40 mg total) by mouth 2 (two) times daily., Disp: 180 tablet, Rfl: 3 .  hydrocortisone (ANUSOL-HC) 25 MG suppository, Place 1 suppository (25 mg total) rectally at bedtime as needed for hemorrhoids or anal itching., Disp: 12 suppository, Rfl: 5 .  ibuprofen (ADVIL,MOTRIN) 200 MG tablet, Take 200 mg by mouth every 6 (six) hours as needed for moderate pain., Disp: , Rfl:  .  loratadine (CLARITIN) 10 MG tablet, Take 1 tablet (10 mg total) by mouth daily., Disp: 30 tablet, Rfl: 11 .  metolazone (ZAROXOLYN) 2.5 MG tablet, Take 1 tablet (2.5 mg total) by mouth daily., Disp: 30 tablet, Rfl: 5 .  propranolol (INDERAL) 20 MG tablet, Take 20 mg by mouth 2 (two) times daily. , Disp: , Rfl:  .  spironolactone (ALDACTONE) 100 MG tablet, Take 50 mg BID for 1 week and then increase to 100 mg BID, Disp: 180 tablet, Rfl: 3 .  spironolactone (ALDACTONE) 25 MG tablet, Take 1 tablet (25 mg total) by  mouth 2 (two) times daily., Disp: 180 tablet, Rfl: 3 .  tamsulosin (FLOMAX) 0.4 MG CAPS capsule, Take 1 capsule (0.4 mg total) by mouth daily., Disp: 90 capsule, Rfl: 3 .  traMADol (ULTRAM) 50 MG tablet, Take 1 to 2 tablets every 8 hours and as needed for pain, Disp: 100 tablet, Rfl: 1 .  ondansetron (ZOFRAN ODT) 4 MG disintegrating tablet, Take 1 tablet (4 mg total) by mouth every 8 (eight) hours as needed for nausea or vomiting. (Patient not taking: Reported on 03/06/2019), Disp: 20 tablet, Rfl: 0 .  ondansetron (ZOFRAN) 4 MG tablet, Take 1 tablet (4 mg total) by mouth every 8 (eight) hours as needed for nausea or vomiting. (Patient not taking: Reported on 03/06/2019), Disp: 45 tablet, Rfl: 1  Review of Systems  Constitutional: Positive for chills and fatigue.  HENT: Positive for ear pain, postnasal drip and sinus pressure.   Eyes: Negative.   Respiratory: Positive for cough.   Cardiovascular: Negative.   Gastrointestinal: Positive for abdominal distention.  Endocrine: Negative.   Neurological: Positive for dizziness and headaches.  Hematological: Negative.   Psychiatric/Behavioral: Negative.     Social History   Tobacco Use  . Smoking status: Never Smoker  . Smokeless tobacco: Never Used  Substance Use Topics  . Alcohol use: No  Objective:   BP 131/78 (BP Location: Left Arm, Patient Position: Sitting, Cuff Size: Large)   Pulse 76   Temp 97.6 F (36.4 C) (Temporal)   Resp 18   Wt (!) 307 lb (139.3 kg)   SpO2 99%   BMI 41.64 kg/m  Vitals:   03/06/19 1334  BP: 131/78  Pulse: 76  Resp: 18  Temp: 97.6 F (36.4 C)  TempSrc: Temporal  SpO2: 99%  Weight: (!) 307 lb (139.3 kg)  Body mass index is 41.64 kg/m.   Physical Exam Vitals signs reviewed.  Constitutional:      General: He is not in acute distress.    Appearance: He is obese. He is not ill-appearing or toxic-appearing.  HENT:     Head: Normocephalic and atraumatic.     Right Ear: External ear normal.      Left Ear: External ear normal.     Nose: Nose normal.     Mouth/Throat:     Pharynx: Oropharynx is clear.  Eyes:     General: No scleral icterus.    Conjunctiva/sclera: Conjunctivae normal.  Neck:     Comments: No JVD noted. Cardiovascular:     Rate and Rhythm: Normal rate and regular rhythm.     Pulses: Normal pulses.     Heart sounds: Normal heart sounds.  Pulmonary:     Effort: Pulmonary effort is normal.  Abdominal:     Palpations: Abdomen is soft.     Tenderness: There is no abdominal tenderness.  Musculoskeletal:     Comments: Trace edema.  Skin:    General: Skin is warm and dry.  Neurological:     General: No focal deficit present.     Mental Status: He is alert and oriented to person, place, and time. Mental status is at baseline.  Psychiatric:        Mood and Affect: Mood normal.        Behavior: Behavior normal.        Thought Content: Thought content normal.        Judgment: Judgment normal.      No results found for any visits on 03/06/19.     Assessment & Plan    1. Suspected Covid-19 Virus Infection R/o covid.  2. Acute bronchitis with COPD ([place])  - azithromycin (ZITHROMAX) 250 MG tablet; Take 1 tablet (250 mg total) by mouth daily.  Dispense: 6 tablet; Refill: 0  3. Bronchitis  - DG Chest 2 View; Future  4. Essential hypertension  - Pro b natriuretic peptide (BNP)9[facility])  5. Fatigue, unspecified type  - HgB A1c - Pro b natriuretic peptide (BNP)[facility] [facility]) - Comp. Metabolic Panel (12)  6. Cirrhosis of liver with ascites, unspecified hepatic cirrhosis type ([place])  - Pro b natriuretic peptide (BNP)[facility] [facility]) - CBC w/Diff/Platelet  7. Vertigo  - Comp. Metabolic Panel (12) - TSH  8. Cough  - Comp. Metabolic Panel (12)  9. Weakness  - Comp. Metabolic Panel (12) - TSH  10. Diabetes mellitus type 2 in obese ([place])  11.Portal hypertension Improved on present  diuretics/meds.    Khira Cudmore Cranford Mon, MD  [facility]

## 2019-03-07 ENCOUNTER — Ambulatory Visit
Admission: RE | Admit: 2019-03-07 | Discharge: 2019-03-07 | Disposition: A | Discharge status: Home / Self Care | Payer: BC Managed Care – PPO | Source: Ambulatory Visit | Attending: Family Medicine | Admitting: Family Medicine

## 2019-03-07 ENCOUNTER — Other Ambulatory Visit: Payer: Self-pay

## 2019-03-07 DIAGNOSIS — J40 Bronchitis, not specified as acute or chronic: Secondary | ICD-10-CM | POA: Insufficient documentation

## 2019-03-07 DIAGNOSIS — I10 Essential (primary) hypertension: Secondary | ICD-10-CM | POA: Diagnosis not present

## 2019-03-07 DIAGNOSIS — K7581 Nonalcoholic steatohepatitis (NASH): Secondary | ICD-10-CM | POA: Diagnosis not present

## 2019-03-07 DIAGNOSIS — R42 Dizziness and giddiness: Secondary | ICD-10-CM | POA: Diagnosis not present

## 2019-03-07 DIAGNOSIS — J42 Unspecified chronic bronchitis: Secondary | ICD-10-CM | POA: Diagnosis not present

## 2019-03-07 DIAGNOSIS — K7460 Unspecified cirrhosis of liver: Secondary | ICD-10-CM | POA: Diagnosis not present

## 2019-03-07 DIAGNOSIS — R531 Weakness: Secondary | ICD-10-CM | POA: Diagnosis not present

## 2019-03-07 DIAGNOSIS — R5383 Other fatigue: Secondary | ICD-10-CM | POA: Diagnosis not present

## 2019-03-07 DIAGNOSIS — R188 Other ascites: Secondary | ICD-10-CM | POA: Diagnosis not present

## 2019-03-08 LAB — COMP. METABOLIC PANEL (12)
AST: 46 IU/L — ABNORMAL HIGH (ref 0–40)
Albumin/Globulin Ratio: 0.9 — ABNORMAL LOW (ref 1.2–2.2)
Albumin: 2.8 g/dL — ABNORMAL LOW (ref 3.8–4.9)
Alkaline Phosphatase: 148 IU/L — ABNORMAL HIGH (ref 39–117)
BUN/Creatinine Ratio: 15 (ref 9–20)
BUN: 19 mg/dL (ref 6–24)
Bilirubin Total: 2.1 mg/dL — ABNORMAL HIGH (ref 0.0–1.2)
Calcium: 8.6 mg/dL — ABNORMAL LOW (ref 8.7–10.2)
Chloride: 103 mmol/L (ref 96–106)
Creatinine, Ser: 1.25 mg/dL (ref 0.76–1.27)
GFR calc Af Amer: 73 mL/min/{1.73_m2} (ref >59)
GFR calc non Af Amer: 64 mL/min/{1.73_m2} (ref >59)
Globulin, Total: 3.2 g/dL (ref 1.5–4.5)
Glucose: 238 mg/dL — ABNORMAL HIGH (ref 65–99)
Potassium: 4.1 mmol/L (ref 3.5–5.2)
Sodium: 136 mmol/L (ref 134–144)
Total Protein: 6 g/dL (ref 6.0–8.5)

## 2019-03-08 LAB — CBC WITH DIFFERENTIAL/PLATELET
Basophils Absolute: 0.1 10*3/uL (ref 0.0–0.2)
Basos: 1 %
EOS (ABSOLUTE): 0.2 10*3/uL (ref 0.0–0.4)
Eos: 5 %
Hematocrit: 28.2 % — ABNORMAL LOW (ref 37.5–51.0)
Hemoglobin: 10.2 g/dL — ABNORMAL LOW (ref 13.0–17.7)
Immature Grans (Abs): 0 10*3/uL (ref 0.0–0.1)
Immature Granulocytes: 0 %
Lymphocytes Absolute: 0.9 10*3/uL (ref 0.7–3.1)
Lymphs: 21 %
MCH: 34.1 pg — ABNORMAL HIGH (ref 26.6–33.0)
MCHC: 36.2 g/dL — ABNORMAL HIGH (ref 31.5–35.7)
MCV: 94 fL (ref 79–97)
Monocytes Absolute: 0.3 10*3/uL (ref 0.1–0.9)
Monocytes: 8 %
Neutrophils Absolute: 2.8 10*3/uL (ref 1.4–7.0)
Neutrophils: 65 %
Platelets: 40 10*3/uL — CL (ref 150–450)
RBC: 2.99 x10E6/uL — ABNORMAL LOW (ref 4.14–5.80)
RDW: 14 % (ref 11.6–15.4)
WBC: 4.4 10*3/uL (ref 3.4–10.8)

## 2019-03-08 LAB — NOVEL CORONAVIRUS, NAA: SARS-CoV-2, NAA: NOT DETECTED

## 2019-03-08 LAB — HEMOGLOBIN A1C
Est. average glucose Bld gHb Est-mCnc: 166 mg/dL
Hgb A1c MFr Bld: 7.4 % — ABNORMAL HIGH (ref 4.8–5.6)

## 2019-03-08 LAB — TSH: TSH: 1.65 u[IU]/mL (ref 0.450–4.500)

## 2019-03-08 LAB — PRO B NATRIURETIC PEPTIDE: NT-Pro BNP: 290 pg/mL — ABNORMAL HIGH (ref 0–210)

## 2019-03-09 ENCOUNTER — Telehealth: Payer: Self-pay

## 2019-03-09 NOTE — Telephone Encounter (Signed)
Advised 

## 2019-03-09 NOTE — Telephone Encounter (Signed)
LMTCB

## 2019-03-09 NOTE — Telephone Encounter (Signed)
-----   Message from Jerrol Banana., MD sent at 03/08/2019  9:59 AM EDT ----- Labs stable with what patient has described that were obtained through GI.  No heart failure.  Diabetes stable.

## 2019-03-15 ENCOUNTER — Telehealth: Payer: Self-pay

## 2019-03-15 ENCOUNTER — Emergency Department
Admission: EM | Admit: 2019-03-15 | Discharge: 2019-03-15 | Disposition: A | Discharge status: Home / Self Care | Payer: BC Managed Care – PPO | Attending: Emergency Medicine | Admitting: Emergency Medicine

## 2019-03-15 ENCOUNTER — Emergency Department: Payer: BC Managed Care – PPO

## 2019-03-15 ENCOUNTER — Encounter: Payer: Self-pay | Admitting: Emergency Medicine

## 2019-03-15 ENCOUNTER — Other Ambulatory Visit: Payer: Self-pay

## 2019-03-15 DIAGNOSIS — J9811 Atelectasis: Secondary | ICD-10-CM | POA: Diagnosis not present

## 2019-03-15 DIAGNOSIS — E119 Type 2 diabetes mellitus without complications: Secondary | ICD-10-CM | POA: Insufficient documentation

## 2019-03-15 DIAGNOSIS — R531 Weakness: Secondary | ICD-10-CM | POA: Insufficient documentation

## 2019-03-15 DIAGNOSIS — R799 Abnormal finding of blood chemistry, unspecified: Secondary | ICD-10-CM | POA: Diagnosis not present

## 2019-03-15 DIAGNOSIS — I10 Essential (primary) hypertension: Secondary | ICD-10-CM | POA: Insufficient documentation

## 2019-03-15 DIAGNOSIS — E7220 Disorder of urea cycle metabolism, unspecified: Secondary | ICD-10-CM

## 2019-03-15 LAB — CBC
HCT: 30.7 % — ABNORMAL LOW (ref 39.0–52.0)
Hemoglobin: 10.7 g/dL — ABNORMAL LOW (ref 13.0–17.0)
MCH: 33.3 pg (ref 26.0–34.0)
MCHC: 34.9 g/dL (ref 30.0–36.0)
MCV: 95.6 fL (ref 80.0–100.0)
Platelets: 49 10*3/uL — ABNORMAL LOW (ref 150–400)
RBC: 3.21 MIL/uL — ABNORMAL LOW (ref 4.22–5.81)
RDW: 14.6 % (ref 11.5–15.5)
WBC: 4.9 10*3/uL (ref 4.0–10.5)
nRBC: 0 % (ref 0.0–0.2)

## 2019-03-15 LAB — BASIC METABOLIC PANEL
Anion gap: 5 (ref 5–15)
BUN: 19 mg/dL (ref 6–20)
CO2: 26 mmol/L (ref 22–32)
Calcium: 8.2 mg/dL — ABNORMAL LOW (ref 8.9–10.3)
Chloride: 107 mmol/L (ref 98–111)
Creatinine, Ser: 1.02 mg/dL (ref 0.61–1.24)
GFR calc Af Amer: >60 mL/min (ref >60)
GFR calc non Af Amer: >60 mL/min (ref >60)
Glucose, Bld: 284 mg/dL — ABNORMAL HIGH (ref 70–99)
Potassium: 4.3 mmol/L (ref 3.5–5.1)
Sodium: 138 mmol/L (ref 135–145)

## 2019-03-15 LAB — PROTIME-INR
INR: 1.4 — ABNORMAL HIGH (ref 0.8–1.2)
Prothrombin Time: 16.8 seconds — ABNORMAL HIGH (ref 11.4–15.2)

## 2019-03-15 LAB — TROPONIN I (HIGH SENSITIVITY): Troponin I (High Sensitivity): 11 ng/L (ref <18)

## 2019-03-15 LAB — AMMONIA: Ammonia: 92 umol/L — ABNORMAL HIGH (ref 9–35)

## 2019-03-15 MED ORDER — LACTULOSE 10 GM/15ML PO SOLN
30.0000 g | Freq: Once | ORAL | Status: AC
  Administered 2019-03-15: 30 g via ORAL
  Filled 2019-03-15: qty 60

## 2019-03-15 MED ORDER — LACTULOSE 10 GM/15ML PO SOLN: 10.0000 g | Freq: Three times a day (TID) | ORAL | 0 refills | Status: DC

## 2019-03-15 MED ORDER — ONDANSETRON HCL 4 MG/2ML IJ SOLN
4.0000 mg | Freq: Once | INTRAMUSCULAR | Status: AC
  Administered 2019-03-15: 4 mg via INTRAVENOUS
  Filled 2019-03-15: qty 2

## 2019-03-15 NOTE — ED Notes (Signed)
Pt gathered all personal belongings from room and removed them prior to ED departure

## 2019-03-15 NOTE — ED Provider Notes (Signed)
[facility] Emergency Department Provider Note       Time seen: ----------------------------------------- 11:17 AM on 03/15/2019 -----------------------------------------   I have reviewed the triage vital signs and the nursing notes.  HISTORY   Chief Complaint Abnormal Lab    HPI Jesse Zimmerman is a 58 y.o. male with a history of chronic back pain, cirrhosis, diabetes, esophageal varices, hyperlipidemia who presents to the ED for weakness.  Patient states he was told he had abnormal blood counts.  He called his doctor who told him to come to the ER for further evaluation.  He has had dizziness and weakness with nausea for the past 12 hours.  He denies near syncope.  Past Medical History:  Diagnosis Date  . Chronic back pain   . Cirrhosis ([place])    NASH  . Diabetes mellitus without complication ([place])   . Esophageal varices ([place])   . Hyperlipidemia   . NASH (nonalcoholic steatohepatitis)     Patient Active Problem List   Diagnosis Date Noted  . Melena   . Acute gastric ulcer with hemorrhage   . Acute gastritis without hemorrhage   . GI bleed 07/17/2018  . Blood in stool   . Secondary esophageal varices without bleeding ([place])   . Portal hypertension ([place])   . Upper GI bleed 04/17/2017  . Contusion, back 12/26/2014  . Bruising 12/26/2014  . Back pain, chronic 11/22/2014  . Diabetes mellitus type 2 in obese ([place]) 11/22/2014  . 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 Acid reflux 11/22/2014  . HLD (hyperlipidemia) 11/22/2014  . BP (high blood pressure) 11/22/2014  . 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 Cannot sleep 11/22/2014  . Cirrhosis ([place]) 11/22/2014  . Adiposity 11/22/2014  . Change in blood platelet count 11/22/2014  . Avitaminosis D 11/22/2014  . Encephalopathy, hepatic ([place]) 05/23/2012  . 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 COLONOSCOPY WITH  PROPOFOL N/A 08/12/2017   Procedure: COLONOSCOPY WITH PROPOFOL;  Surgeon: Jonathon Bellows, MD;  Location: [facility] ENDOSCOPY;  Service: Gastroenterology;  Laterality: N/A;  . ESOPHAGOGASTRODUODENOSCOPY (EGD) WITH PROPOFOL N/A 04/18/2017   Procedure: ESOPHAGOGASTRODUODENOSCOPY (EGD) WITH PROPOFOL;  Surgeon: [name], MD;  Location: [facility] ENDOSCOPY;  Service: Endoscopy;  Laterality: N/A;  . ESOPHAGOGASTRODUODENOSCOPY (EGD) WITH PROPOFOL N/A 07/18/2018   Procedure: ESOPHAGOGASTRODUODENOSCOPY (EGD) WITH PROPOFOL;  Surgeon: [name], MD;  Location: [facility] ENDOSCOPY;  Service: Endoscopy;  Laterality: N/A;  . EXCESSIVE THIGH / HIP / BUTTOCK / FLANK SKIN EXCISION     PART OF INNER THIGH/DUE TO SEPSIS INFECTION REMOVED  . KIDNEY STONE SURGERY     removed  . TONSILLECTOMY AND ADENOIDECTOMY      Allergies Latex and Pineapple  Social History Social History   Tobacco Use  . Smoking status: Never Smoker  . Smokeless tobacco: Never Used  Substance Use Topics  . Alcohol use: No  . Drug use: No   Review of Systems Constitutional: Negative for fever. Cardiovascular: Negative for chest pain. Respiratory: Negative for shortness of breath. Gastrointestinal: Negative for abdominal pain, vomiting and diarrhea.  Negative for GI bleeding Musculoskeletal: Negative for back pain. Skin: Negative for rash. Neurological: Positive for weakness  All systems negative/normal/unremarkable except as stated in the HPI  ____________________________________________   PHYSICAL EXAM:  VITAL SIGNS: ED Triage Vitals [03/15/19 1102]  Enc Vitals Group     BP (!) 118/54     Pulse Rate 62  Resp 20     Temp 98.7 F (37.1 C)     Temp Source Oral     SpO2 99 %     Weight (!) 307 lb (139.3 kg)     Height 6' (1.829 m)     Head Circumference      Peak Flow      Pain Score 0     Pain Loc      Pain Edu?      Excl. in [place]?    Constitutional: Alert and oriented. Well appearing and in no distress. Eyes:  Conjunctivae are normal. Normal extraocular movements. ENT      Head: Normocephalic and atraumatic.      Nose: No congestion/rhinnorhea.      Mouth/Throat: Mucous membranes are moist.      Neck: No stridor. Cardiovascular: Normal rate, regular rhythm. No murmurs, rubs, or gallops. Respiratory: Normal respiratory effort without tachypnea nor retractions. Breath sounds are clear and equal bilaterally. No wheezes/rales/rhonchi. Gastrointestinal: Soft and nontender. Normal bowel sounds Musculoskeletal: Nontender with normal range of motion in extremities. No lower extremity tenderness nor edema. Neurologic:  Normal speech and language. No gross focal neurologic deficits are appreciated.  Skin:  Skin is warm, dry and intact. No rash noted. Psychiatric: Mood and affect are normal. Speech and behavior are normal.  ____________________________________________  EKG: Interpreted by me.  Sinus rhythm with a rate of 63 bpm, LVH, leftward axis, normal QT  ____________________________________________  ED COURSE:  As part of my medical decision making, I reviewed the following data within the [place] History obtained from family if available, nursing notes, old chart and ekg, as well as notes from prior ED visits. Patient presented for weakness, we will assess with labs and imaging as indicated at this time.   Procedures  Jesse Zimmerman was evaluated in Emergency Department on 03/15/2019 for the symptoms described in the history of present illness. He was evaluated in the context of the global COVID-19 pandemic, which necessitated consideration that the patient might be at risk for infection with the SARS-CoV-2 virus that causes COVID-19. Institutional protocols and algorithms that pertain to the evaluation of patients at risk for COVID-19 are in a state of rapid change based on information released by regulatory bodies including the CDC and federal and state organizations. These  policies and algorithms were followed during the patient's care in the ED.  ____________________________________________   LABS (pertinent positives/negatives)  Labs Reviewed  BASIC METABOLIC PANEL - Abnormal; Notable for the following components:      Result Value   Glucose, Bld 284 (*)    Calcium 8.2 (*)    All other components within normal limits  CBC - Abnormal; Notable for the following components:   RBC 3.21 (*)    Hemoglobin 10.7 (*)    HCT 30.7 (*)    Platelets 49 (*)    All other components within normal limits  PROTIME-INR - Abnormal; Notable for the following components:   Prothrombin Time 16.8 (*)    INR 1.4 (*)    All other components within normal limits  AMMONIA - Abnormal; Notable for the following components:   Ammonia 92 (*)    All other components within normal limits  BLOOD GAS, VENOUS - Abnormal; Notable for the following components:   Bicarbonate 28.4 (*)    Acid-Base Excess 2.5 (*)    All other components within normal limits  URINALYSIS, COMPLETE (UACMP) WITH MICROSCOPIC  TROPONIN I (HIGH SENSITIVITY)  RADIOLOGY Images were viewed by me  Chest x-ray IMPRESSION:  Low lung volumes with vascular crowding and streaky atelectasis. No  definite infiltrates or effusions.  ____________________________________________   DIFFERENTIAL DIAGNOSIS   Anemia, occult GI bleeding, dehydration, electrolyte abnormality, hyperammonemia  FINAL ASSESSMENT AND PLAN  Weakness, hyperammonemia   Plan: The patient had presented for weakness. Patient's labs indicated an elevated ammonia level without any other acute process.  This seems to be cirrhosis related but the rest of his labs were within normal limits.  He was started on lactulose here and is otherwise alert and oriented.  Patient's imaging has not revealed any acute process.  He will go home on lactulose and is otherwise cleared for outpatient follow-up.   Laurence Aly, MD    Note: This note was  generated in part or whole with voice recognition software. Voice recognition is usually quite accurate but there are transcription errors that can and very often do occur. I apologize for any typographical errors that were not detected and corrected.     Earleen Newport, MD 03/15/19 [phone]

## 2019-03-15 NOTE — Telephone Encounter (Signed)
Patient called the office c/o severe weakness, nausea, headache, and lightheaded for the last 24hrs. He reports that he can barely place one foot in front of the other. He reports that he recently had labs done, and his electrolytes, Vit B12 and VitD levels were very low. He also mentions that his platelets were low. Patient has not eaten or had anything to drink in the last 12-24hrs due to severe nausea.   Per Dr. Rosanna Randy, it was recommended that her go to the ER for evaluation. He may be dehydrated. He also has liver disease which could be contributing to his symptoms. Patient was advised.

## 2019-03-15 NOTE — ED Triage Notes (Signed)
Pt reports had abnormal blood count labs and called his MD who said to come to the ED> pt c/o dizziness, weakness and nausea for the last 12 hours.

## 2019-03-18 ENCOUNTER — Emergency Department: Payer: BC Managed Care – PPO

## 2019-03-18 ENCOUNTER — Emergency Department
Admission: EM | Admit: 2019-03-18 | Discharge: 2019-03-19 | Disposition: A | Discharge status: Home / Self Care | Payer: BC Managed Care – PPO | Attending: Emergency Medicine | Admitting: Emergency Medicine

## 2019-03-18 ENCOUNTER — Other Ambulatory Visit: Payer: Self-pay

## 2019-03-18 DIAGNOSIS — R110 Nausea: Secondary | ICD-10-CM | POA: Diagnosis not present

## 2019-03-18 DIAGNOSIS — Z79899 Other long term (current) drug therapy: Secondary | ICD-10-CM | POA: Insufficient documentation

## 2019-03-18 DIAGNOSIS — N390 Urinary tract infection, site not specified: Secondary | ICD-10-CM | POA: Insufficient documentation

## 2019-03-18 DIAGNOSIS — E119 Type 2 diabetes mellitus without complications: Secondary | ICD-10-CM | POA: Insufficient documentation

## 2019-03-18 DIAGNOSIS — Z20828 Contact with and (suspected) exposure to other viral communicable diseases: Secondary | ICD-10-CM | POA: Diagnosis not present

## 2019-03-18 DIAGNOSIS — R509 Fever, unspecified: Secondary | ICD-10-CM

## 2019-03-18 DIAGNOSIS — F509 Eating disorder, unspecified: Secondary | ICD-10-CM | POA: Insufficient documentation

## 2019-03-18 DIAGNOSIS — R05 Cough: Secondary | ICD-10-CM | POA: Diagnosis not present

## 2019-03-18 LAB — COMPREHENSIVE METABOLIC PANEL
ALT: 27 U/L (ref 0–44)
AST: 49 U/L — ABNORMAL HIGH (ref 15–41)
Albumin: 2.9 g/dL — ABNORMAL LOW (ref 3.5–5.0)
Alkaline Phosphatase: 160 U/L — ABNORMAL HIGH (ref 38–126)
Anion gap: 11 (ref 5–15)
BUN: 19 mg/dL (ref 6–20)
CO2: 20 mmol/L — ABNORMAL LOW (ref 22–32)
Calcium: 8.3 mg/dL — ABNORMAL LOW (ref 8.9–10.3)
Chloride: 104 mmol/L (ref 98–111)
Creatinine, Ser: 1.36 mg/dL — ABNORMAL HIGH (ref 0.61–1.24)
GFR calc Af Amer: >60 mL/min (ref >60)
GFR calc non Af Amer: 57 mL/min — ABNORMAL LOW (ref >60)
Glucose, Bld: 231 mg/dL — ABNORMAL HIGH (ref 70–99)
Potassium: 4 mmol/L (ref 3.5–5.1)
Sodium: 135 mmol/L (ref 135–145)
Total Bilirubin: 3.2 mg/dL — ABNORMAL HIGH (ref 0.3–1.2)
Total Protein: 6.7 g/dL (ref 6.5–8.1)

## 2019-03-18 LAB — CBC
HCT: 30 % — ABNORMAL LOW (ref 39.0–52.0)
Hemoglobin: 10.5 g/dL — ABNORMAL LOW (ref 13.0–17.0)
MCH: 33.2 pg (ref 26.0–34.0)
MCHC: 35 g/dL (ref 30.0–36.0)
MCV: 94.9 fL (ref 80.0–100.0)
Platelets: 57 10*3/uL — ABNORMAL LOW (ref 150–400)
RBC: 3.16 MIL/uL — ABNORMAL LOW (ref 4.22–5.81)
RDW: 14.4 % (ref 11.5–15.5)
WBC: 10.6 10*3/uL — ABNORMAL HIGH (ref 4.0–10.5)
nRBC: 0 % (ref 0.0–0.2)

## 2019-03-18 LAB — URINALYSIS, COMPLETE (UACMP) WITH MICROSCOPIC
Bilirubin Urine: NEGATIVE
Glucose, UA: >=500 mg/dL — AB
Ketones, ur: NEGATIVE mg/dL
Nitrite: NEGATIVE
Protein, ur: NEGATIVE mg/dL
Specific Gravity, Urine: 1.023 (ref 1.005–1.030)
pH: 5 (ref 5.0–8.0)

## 2019-03-18 LAB — SARS CORONAVIRUS 2 BY RT PCR (HOSPITAL ORDER, PERFORMED IN ~~LOC~~ HOSPITAL LAB): SARS Coronavirus 2: NEGATIVE

## 2019-03-18 LAB — LIPASE, BLOOD: Lipase: 41 U/L (ref 11–51)

## 2019-03-18 MED ORDER — ACETAMINOPHEN 325 MG PO TABS
650.0000 mg | ORAL_TABLET | Freq: Once | ORAL | Status: AC
  Administered 2019-03-18: 650 mg via ORAL
  Filled 2019-03-18: qty 2

## 2019-03-18 MED ORDER — SODIUM CHLORIDE 0.9% FLUSH: 3.0000 mL | Freq: Once | INTRAVENOUS | Status: DC

## 2019-03-18 MED ORDER — ONDANSETRON 4 MG PO TBDP
4.0000 mg | ORAL_TABLET | Freq: Once | ORAL | Status: AC | PRN
  Administered 2019-03-18: 17:00:00 4 mg via ORAL
  Filled 2019-03-18: qty 1

## 2019-03-18 MED ORDER — ACETAMINOPHEN 500 MG PO TABS
1000.0000 mg | ORAL_TABLET | Freq: Once | ORAL | Status: DC
  Filled 2019-03-18: qty 2

## 2019-03-18 MED ORDER — ONDANSETRON HCL 4 MG/2ML IJ SOLN
4.0000 mg | Freq: Once | INTRAMUSCULAR | Status: AC
  Administered 2019-03-18: 4 mg via INTRAVENOUS
  Filled 2019-03-18: qty 2

## 2019-03-18 MED ORDER — IBUPROFEN 600 MG PO TABS
600.0000 mg | ORAL_TABLET | Freq: Once | ORAL | Status: AC
  Administered 2019-03-18: 600 mg via ORAL
  Filled 2019-03-18: qty 1

## 2019-03-18 MED ORDER — SODIUM CHLORIDE 0.9 % IV BOLUS
1000.0000 mL | Freq: Once | INTRAVENOUS | Status: AC
  Administered 2019-03-18: 1000 mL via INTRAVENOUS

## 2019-03-18 MED ORDER — SODIUM CHLORIDE 0.9 % IV SOLN
1.0000 g | Freq: Once | INTRAVENOUS | Status: AC
  Administered 2019-03-18: 1 g via INTRAVENOUS
  Filled 2019-03-18: qty 10

## 2019-03-18 MED ORDER — CEPHALEXIN 500 MG PO CAPS: 500.0000 mg | ORAL_CAPSULE | Freq: Four times a day (QID) | ORAL | 0 refills | Status: AC

## 2019-03-18 NOTE — ED Notes (Signed)
ED Provider at bedside. 

## 2019-03-18 NOTE — ED Triage Notes (Signed)
Pt presents via POV c/o fever, cough, and emesis starting today at apx 1530.

## 2019-03-18 NOTE — Discharge Instructions (Addendum)
Please seek medical attention for any high fevers, chest pain, shortness of breath, change in behavior, persistent vomiting, bloody stool or any other new or concerning symptoms.  

## 2019-03-18 NOTE — ED Notes (Signed)
This Rn attempted to establish a PIV 1x unsuccessful attempt. RN Maudie Mercury made aware.

## 2019-03-18 NOTE — ED Provider Notes (Signed)
[facility] Emergency Department Provider Note    ____________________________________________   I have reviewed the triage vital signs and the nursing notes.   HISTORY  Chief Complaint Emesis and Fever   History limited by: Not Limited   HPI Jesse Zimmerman is a 58 y.o. male who presents to the emergency department today because of concern for nausea, fever and feeling unwell. The patient states that he was recently seen in the ed and was found to have elevated ammonia.  He states he has been taking the medication prescribed.  States today he started developing some nausea.  He then had multiple episodes of nonbloody emesis.  Denies any associated abdominal pain.  He does state that he then started having fevers today.  Additionally patient is complaining of cough.  He states he has had this cough for about 10 weeks.   Records reviewed. Per medical record review patient has a history of recent ER visit with finding of elevated ammonia.   Past Medical History:  Diagnosis Date  . Chronic back pain   . 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 Diabetes mellitus without complication ([place])   . Esophageal varices ([place])   . Hyperlipidemia   . NASH (nonalcoholic steatohepatitis)     Patient Active Problem List   Diagnosis Date Noted  . Melena   . Acute gastric ulcer with hemorrhage   . Acute gastritis without hemorrhage   . GI bleed 07/17/2018  . Blood in stool   . Secondary esophageal varices without bleeding ([place])   . Portal hypertension ([place])   . Upper GI bleed 04/17/2017  . Contusion, back 12/26/2014  . Bruising 12/26/2014  . Back pain, chronic 11/22/2014  . Diabetes mellitus type 2 in obese ([place]) 11/22/2014  . 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 Acid reflux 11/22/2014  . HLD (hyperlipidemia) 11/22/2014  . BP (high blood pressure) 11/22/2014  . 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 Cannot sleep 11/22/2014  . Cirrhosis ([place]) 11/22/2014  . Adiposity 11/22/2014   . Change in blood platelet count 11/22/2014  . Avitaminosis D 11/22/2014  . Encephalopathy, hepatic ([place]) 05/23/2012  . 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 COLONOSCOPY WITH PROPOFOL N/A 08/12/2017   Procedure: COLONOSCOPY WITH PROPOFOL;  Surgeon: Jonathon Bellows, MD;  Location[facility] ENDOSCOPY;  Service: Gastroenterology;  Laterality: N/A;  . ESOPHAGOGASTRODUODENOSCOPY (EGD) WITH PROPOFOL N/A 04/18/2017   Procedure: ESOPHAGOGASTRODUODENOSCOPY (EGD) WITH PROPOFOL;  Surgeon: [name], MD;  Location: [facility] ENDOSCOPY;  Service: Endoscopy;  Laterality: N/A;  . ESOPHAGOGASTRODUODENOSCOPY (EGD) WITH PROPOFOL N/A 07/18/2018   Procedure: ESOPHAGOGASTRODUODENOSCOPY (EGD) WITH PROPOFOL;  Surgeon: [name], MD;  Location: [facility] ENDOSCOPY;  Service: Endoscopy;  Laterality: N/A;  . EXCESSIVE THIGH / HIP / BUTTOCK / FLANK SKIN EXCISION     PART OF INNER THIGH/DUE TO SEPSIS INFECTION REMOVED  . KIDNEY STONE SURGERY     removed  . TONSILLECTOMY AND ADENOIDECTOMY      Prior to Admission medications   Medication Sig Start Date End Date Taking? Authorizing Provider  acetaminophen (TYLENOL) 325 MG tablet Take 650 mg by mouth every 6 (six) hours as needed for mild pain or fever.    [provider]  azithromycin (ZITHROMAX) 250 MG tablet Take 1 tablet (250 mg total) by mouth daily. 03/06/19   Jerrol Banana., MD  benzonatate (TESSALON PERLES) 100 MG capsule Take 1 capsule (100 mg total) by  mouth 3 (three) times daily as needed for cough. Patient not taking: Reported on 03/15/2019 11/20/18 11/20/19  Nena Polio, MD  Canagliflozin-metFORMIN HCl ER (INVOKAMET XR) 50-1000 MG TB24 Take 1 tablet by mouth daily. 09/06/18   Jerrol Banana., MD  furosemide (LASIX) 40 MG tablet Take 1 tablet (40 mg total) by mouth 2 (two) times daily. 09/14/18   Jerrol Banana., MD  hydrocortisone (ANUSOL-HC) 25 MG  suppository Place 1 suppository (25 mg total) rectally at bedtime as needed for hemorrhoids or anal itching. 12/21/17   Jerrol Banana., MD  ibuprofen (ADVIL,MOTRIN) 200 MG tablet Take 200 mg by mouth every 6 (six) hours as needed for moderate pain.    [provider]  lactulose (CONSTULOSE) 10 GM/15ML solution Take 15 mLs (10 g total) by mouth 3 (three) times daily. 03/15/19   Earleen Newport, MD  loratadine (CLARITIN) 10 MG tablet Take 1 tablet (10 mg total) by mouth daily. 09/20/18   Jerrol Banana., MD  meclizine (ANTIVERT) 25 MG tablet Take 1 tablet (25 mg total) by mouth 3 (three) times daily as needed for dizziness. 03/06/19   Jerrol Banana., MD  metolazone (ZAROXOLYN) 2.5 MG tablet Take 1 tablet (2.5 mg total) by mouth daily. 09/20/18   Jerrol Banana., MD  ondansetron (ZOFRAN ODT) 4 MG disintegrating tablet Take 1 tablet (4 mg total) by mouth every 8 (eight) hours as needed for nausea or vomiting. Patient not taking: Reported on 03/06/2019 11/20/18   Nena Polio, MD  ondansetron (ZOFRAN) 4 MG tablet Take 1 tablet (4 mg total) by mouth every 8 (eight) hours as needed for nausea or vomiting. Patient not taking: Reported on 03/06/2019 12/21/17   Jerrol Banana., MD  promethazine (PHENERGAN) 25 MG tablet Take 1 tablet (25 mg total) by mouth every 8 (eight) hours as needed for nausea or vomiting. 03/06/19   Jerrol Banana., MD  propranolol (INDERAL) 20 MG tablet Take 20 mg by mouth 2 (two) times daily.     [provider]  spironolactone (ALDACTONE) 100 MG tablet Take 50 mg BID for 1 week and then increase to 100 mg BID 10/19/18   Jerrol Banana., MD  tamsulosin (FLOMAX) 0.4 MG CAPS capsule Take 1 capsule (0.4 mg total) by mouth daily. 05/11/18   Jerrol Banana., MD  traMADol Veatrice Bourbon) 50 MG tablet Take 1 to 2 tablets every 8 hours and as needed for pain Patient not taking: Reported on 03/15/2019 10/19/18   Jerrol Banana., MD   blood glucose meter kit and supplies Dispense based on patient and insurance preference. Use up to four times daily as directed. (FOR ICD-10 E10.9, E11.9). 03/06/19 03/06/19  Jerrol Banana., MD    Allergies Latex and Pineapple  Family History  Problem Relation Age of Onset  . Hypertension Mother   . Breast cancer Mother   . Dementia Mother   . Heart attack Father   . Gout Father   . Hypertension Father   . Hypertension Sister   . Hypertension Sister   . Colon cancer Maternal Aunt   . Colon cancer Maternal Grandmother   . Colon cancer Paternal Grandmother     Social History Social History   Tobacco Use  . Smoking status: Never Smoker  . Smokeless tobacco: Never Used  Substance Use Topics  . Alcohol use: No  . Drug use: No    Review  of Systems Constitutional: Positive for fever. Eyes: No visual changes. ENT: No sore throat. Cardiovascular: Denies chest pain. Respiratory: Denies shortness of breath. Positive for cough. Gastrointestinal: No abdominal pain.  Positive for nausea and vomiting. Genitourinary: Negative for dysuria. Musculoskeletal: Negative for back pain. Skin: Negative for rash. Neurological: Negative for headaches, focal weakness or numbness.  ____________________________________________   PHYSICAL EXAM:  VITAL SIGNS: ED Triage Vitals  Enc Vitals Group     BP 03/18/19 1709 (!) 172/59     Pulse Rate 03/18/19 1709 92     Resp 03/18/19 1709 20     Temp 03/18/19 1709 (!) 101.2 F (38.4 C)     Temp Source 03/18/19 1709 Oral     SpO2 03/18/19 1709 96 %     Weight 03/18/19 1714 (!) 307 lb (139.3 kg)     Height --      Head Circumference --      Peak Flow --      Pain Score 03/18/19 1714 0    Constitutional: Alert and oriented.  Eyes: Conjunctivae are normal.  ENT      Head: Normocephalic and atraumatic.      Nose: No congestion/rhinnorhea.      Mouth/Throat: Mucous membranes are moist.      Neck: No  stridor. Hematological/Lymphatic/Immunilogical: No cervical lymphadenopathy. Cardiovascular: Normal rate, regular rhythm.  No murmurs, rubs, or gallops Respiratory: Normal respiratory effort without tachypnea nor retractions. Breath sounds are clear and equal bilaterally. No wheezes/rales/rhonchi. Frequent dry cough. Gastrointestinal: Soft and non tender. No rebound. No guarding.  Genitourinary: Deferred Musculoskeletal: Normal range of motion in all extremities. No lower extremity edema. Neurologic:  Normal speech and language. No gross focal neurologic deficits are appreciated.  Skin:  Skin is warm, dry and intact. No rash noted. Psychiatric: Mood and affect are normal. Speech and behavior are normal. Patient exhibits appropriate insight and judgment.  ____________________________________________    LABS (pertinent positives/negatives)  UA moderate hgb dipstick, moderate leukocytes, 11-20 wbc and rbc, rare bacteria Lipase 41 CBC wbc 10.6, hgb 10.5, plt 57 CMP na 135, k 4.0, glu 231, cr 1.36 COVID negative ____________________________________________   EKG  None  ____________________________________________    RADIOLOGY  CXR No pneumonia, some vascular congestion  ____________________________________________   PROCEDURES  Procedures  ____________________________________________   INITIAL IMPRESSION / ASSESSMENT AND PLAN / ED COURSE  Pertinent labs & imaging results that were available during my care of the patient were reviewed by me and considered in my medical decision making (see chart for details).   Patient presented to the emergency department today because of concerns for fevers and feeling unwell.  Broad work-up was initiated.  Terms of infection urine is concerning for infection.  Patient without significantly elevated leukocytes.  Patient will be given dose of IV antibiotics here.  Will plan on discharge with further antibiotics.  Discussed findings  work-up with patient family.  ____________________________________________   FINAL CLINICAL IMPRESSION(S) / ED DIAGNOSES  Final diagnoses:  Lower urinary tract infectious disease  Fever, unspecified fever cause     Note: This dictation was prepared with Dragon dictation. Any transcriptional errors that result from this process are unintentional     Nance Pear, MD 03/18/19 2337

## 2019-03-18 NOTE — ED Notes (Signed)
Per patient he has vomiting earlier in the day and was unable to stop. Patient states he was been coughing and having SOB for months and has had multiple COVID test come back negative.

## 2019-03-21 ENCOUNTER — Encounter: Payer: Self-pay | Admitting: Family Medicine

## 2019-03-21 ENCOUNTER — Ambulatory Visit (INDEPENDENT_AMBULATORY_CARE_PROVIDER_SITE_OTHER): Payer: BC Managed Care – PPO | Admitting: Family Medicine

## 2019-03-21 ENCOUNTER — Other Ambulatory Visit: Payer: Self-pay

## 2019-03-21 VITALS — BP 122/62 | HR 72 | Temp 97.1°F | Resp 16 | Wt 334.0 lb

## 2019-03-21 DIAGNOSIS — K7460 Unspecified cirrhosis of liver: Secondary | ICD-10-CM | POA: Diagnosis not present

## 2019-03-21 DIAGNOSIS — N390 Urinary tract infection, site not specified: Secondary | ICD-10-CM | POA: Diagnosis not present

## 2019-03-21 DIAGNOSIS — R188 Other ascites: Secondary | ICD-10-CM

## 2019-03-21 DIAGNOSIS — L03116 Cellulitis of left lower limb: Secondary | ICD-10-CM

## 2019-03-21 DIAGNOSIS — K7682 Hepatic encephalopathy: Secondary | ICD-10-CM

## 2019-03-21 DIAGNOSIS — K7290 Hepatic failure, unspecified without coma: Secondary | ICD-10-CM

## 2019-03-21 DIAGNOSIS — K760 Fatty (change of) liver, not elsewhere classified: Secondary | ICD-10-CM

## 2019-03-21 DIAGNOSIS — R600 Localized edema: Secondary | ICD-10-CM

## 2019-03-21 DIAGNOSIS — M7989 Other specified soft tissue disorders: Secondary | ICD-10-CM | POA: Diagnosis not present

## 2019-03-21 DIAGNOSIS — E669 Obesity, unspecified: Secondary | ICD-10-CM

## 2019-03-21 DIAGNOSIS — Z6841 Body Mass Index (BMI) 40.0 and over, adult: Secondary | ICD-10-CM

## 2019-03-21 DIAGNOSIS — E1169 Type 2 diabetes mellitus with other specified complication: Secondary | ICD-10-CM

## 2019-03-21 LAB — POCT URINALYSIS DIPSTICK
Bilirubin, UA: NEGATIVE
Glucose, UA: POSITIVE — AB
Ketones, UA: NEGATIVE
Nitrite, UA: NEGATIVE
Protein, UA: NEGATIVE
Spec Grav, UA: 1.01 (ref 1.010–1.025)
Urobilinogen, UA: 0.2 E.U./dL
pH, UA: 6 (ref 5.0–8.0)

## 2019-03-21 MED ORDER — AMOXICILLIN-POT CLAVULANATE 875-125 MG PO TABS: 1.0000 | ORAL_TABLET | Freq: Two times a day (BID) | ORAL | 0 refills | Status: DC

## 2019-03-21 MED ORDER — SPIRONOLACTONE 100 MG PO TABS: 100.0000 mg | ORAL_TABLET | Freq: Two times a day (BID) | ORAL | 3 refills | Status: DC

## 2019-03-21 MED ORDER — FUROSEMIDE 40 MG PO TABS: 40.0000 mg | ORAL_TABLET | Freq: Two times a day (BID) | ORAL | 3 refills | Status: DC

## 2019-03-21 NOTE — Progress Notes (Signed)
Patient: Jesse Zimmerman Male    DOB: 1960/11/20   58 y.o.   MRN: [id number] Visit Date: 03/21/2019  Today's Provider: Wilhemena Durie, MD   Chief Complaint  Patient presents with  . Hospitalization Follow-up   Subjective:   HPI  Follow Up ER Visit  Patient is here for ER follow up.  He was recently seen at [facility] for weakness on 03/15/2019 and for fever and UTI on 03/18/2019. Treatment for this included starting lactulose 10g TID for elevated ammonia levels and cephalexin 541m QID for urinary infection.  He reports good compliance with treatment. He reports this condition is Improved.  He brings in a friend from work for the visit today and has another friend on the phone during his visit.  They are concerned about his health.  As mentioned in his note he had a ammonia levels that were elevated in the ED.  He is tolerating lactulose well and feels better from this.  He does not think he has a urinary tract infection but rather leg infection /cellulitis.  He admits that he ran out of his spironolactone and Lasix about a day ago.  Allergies  Allergen Reactions  . Latex     LATEX TAPE  . Pineapple Hives     Current Outpatient Medications:  .  acetaminophen (TYLENOL) 325 MG tablet, Take 650 mg by mouth every 6 (six) hours as needed for mild pain or fever., Disp: , Rfl:  .  benzonatate (TESSALON PERLES) 100 MG capsule, Take 1 capsule (100 mg total) by mouth 3 (three) times daily as needed for cough., Disp: 30 capsule, Rfl: 0 .  Canagliflozin-metFORMIN HCl ER (INVOKAMET XR) 50-1000 MG TB24, Take 1 tablet by mouth daily., Disp: 120 tablet, Rfl: 3 .  cephALEXin (KEFLEX) 500 MG capsule, Take 1 capsule (500 mg total) by mouth 4 (four) times daily for 10 days., Disp: 40 capsule, Rfl: 0 .  furosemide (LASIX) 40 MG tablet, Take 1 tablet (40 mg total) by mouth 2 (two) times daily., Disp: 180 tablet, Rfl: 3 .  hydrocortisone (ANUSOL-HC) 25 MG suppository, Place 1 suppository (25 mg  total) rectally at bedtime as needed for hemorrhoids or anal itching., Disp: 12 suppository, Rfl: 5 .  ibuprofen (ADVIL,MOTRIN) 200 MG tablet, Take 200 mg by mouth every 6 (six) hours as needed for moderate pain., Disp: , Rfl:  .  lactulose (CONSTULOSE) 10 GM/15ML solution, Take 15 mLs (10 g total) by mouth 3 (three) times daily., Disp: 236 mL, Rfl: 0 .  loratadine (CLARITIN) 10 MG tablet, Take 1 tablet (10 mg total) by mouth daily., Disp: 30 tablet, Rfl: 11 .  meclizine (ANTIVERT) 25 MG tablet, Take 1 tablet (25 mg total) by mouth 3 (three) times daily as needed for dizziness., Disp: 60 tablet, Rfl: 1 .  metolazone (ZAROXOLYN) 2.5 MG tablet, Take 1 tablet (2.5 mg total) by mouth daily., Disp: 30 tablet, Rfl: 5 .  ondansetron (ZOFRAN ODT) 4 MG disintegrating tablet, Take 1 tablet (4 mg total) by mouth every 8 (eight) hours as needed for nausea or vomiting., Disp: 20 tablet, Rfl: 0 .  ondansetron (ZOFRAN) 4 MG tablet, Take 1 tablet (4 mg total) by mouth every 8 (eight) hours as needed for nausea or vomiting., Disp: 45 tablet, Rfl: 1 .  promethazine (PHENERGAN) 25 MG tablet, Take 1 tablet (25 mg total) by mouth every 8 (eight) hours as needed for nausea or vomiting., Disp: 60 tablet, Rfl: 1 .  propranolol (  INDERAL) 20 MG tablet, Take 20 mg by mouth 2 (two) times daily. , Disp: , Rfl:  .  spironolactone (ALDACTONE) 100 MG tablet, Take 50 mg BID for 1 week and then increase to 100 mg BID, Disp: 180 tablet, Rfl: 3 .  tamsulosin (FLOMAX) 0.4 MG CAPS capsule, Take 1 capsule (0.4 mg total) by mouth daily., Disp: 90 capsule, Rfl: 3 .  traMADol (ULTRAM) 50 MG tablet, Take 1 to 2 tablets every 8 hours and as needed for pain, Disp: 100 tablet, Rfl: 1 .  azithromycin (ZITHROMAX) 250 MG tablet, Take 1 tablet (250 mg total) by mouth daily. (Patient not taking: Reported on 03/21/2019), Disp: 6 tablet, Rfl: 0  Review of Systems  Constitutional: Negative for appetite change, chills and fever.  HENT: Negative.   Eyes:  Negative.   Respiratory: Positive for cough. Negative for chest tightness, shortness of breath and wheezing.   Cardiovascular: Positive for leg swelling. Negative for chest pain and palpitations.  Gastrointestinal: Positive for abdominal distention. Negative for abdominal pain, nausea and vomiting.  Endocrine: Negative.   Skin: Positive for color change (redness in left leg).  Allergic/Immunologic: Negative.   Hematological: Negative.   Psychiatric/Behavioral: Negative.     Social History   Tobacco Use  . Smoking status: Never Smoker  . Smokeless tobacco: Never Used  Substance Use Topics  . Alcohol use: No      Objective:   BP 122/62 (BP Location: Right Arm, Patient Position: Sitting, Cuff Size: Large)   Pulse 72   Temp (!) 97.1 F (36.2 C) (Temporal)   Resp 16   Wt (!) 334 lb (151.5 kg)   SpO2 99% Comment: room air  BMI 45.30 kg/m  Vitals:   03/21/19 1441  BP: 122/62  Pulse: 72  Resp: 16  Temp: (!) 97.1 F (36.2 C)  TempSrc: Temporal  SpO2: 99%  Weight: (!) 334 lb (151.5 kg)  Body mass index is 45.3 kg/m.   Physical Exam Vitals signs reviewed.  Constitutional:      Appearance: Normal appearance. He is obese.  HENT:     Head: Normocephalic and atraumatic.     Right Ear: External ear normal.     Left Ear: External ear normal.     Nose: Nose normal.     Mouth/Throat:     Pharynx: Oropharynx is clear.  Eyes:     General: No scleral icterus.    Conjunctiva/sclera: Conjunctivae normal.  Neck:     Comments: No JVD noted. Cardiovascular:     Rate and Rhythm: Normal rate and regular rhythm.     Pulses: Normal pulses.     Heart sounds: Normal heart sounds.  Pulmonary:     Effort: Pulmonary effort is normal.  Abdominal:     Palpations: Abdomen is soft.     Tenderness: There is no abdominal tenderness.  Musculoskeletal:     Left lower leg: Edema present.     Comments: 1+  edema.  Skin:    General: Skin is warm and dry.     Comments: Erythema of upper  inner thigh with mild induration and warmth.  No real tenderness.  Neurological:     General: No focal deficit present.     Mental Status: He is alert and oriented to person, place, and time. Mental status is at baseline.  Psychiatric:        Mood and Affect: Mood normal.        Behavior: Behavior normal.  Thought Content: Thought content normal.        Judgment: Judgment normal.      No results found for any visits on 03/21/19.     Assessment & Plan    1. Cirrhosis of liver with ascites, unspecified hepatic cirrhosis type (HCC) Restart spironolactone 100 mg twice daily and Lasix 40 mg twice daily.  Return to clinic 1 week.  His weight was 307 so he is up 27 pounds from 3 weeks ago. - Ambulatory referral to Gastroenterology  2. Urinary tract infection without hematuria, site unspecified  - POCT Urinalysis Dipstick - Urine Culture - amoxicillin-clavulanate (AUGMENTIN) 875-125 MG tablet; Take 1 tablet by mouth 2 (two) times daily.  Dispense: 30 tablet; Refill: 0  3. Swelling of lower leg I think this extra edema says some of her infection. - spironolactone (ALDACTONE) 100 MG tablet; Take 1 tablet (100 mg total) by mouth 2 (two) times daily. Take 50 mg BID for 1 week and then increase to 100 mg BID  Dispense: 60 tablet; Refill: 3 - US Venous Img Lower Bilateral; Future  4. Pedal edema  - furosemide (LASIX) 40 MG tablet; Take 1 tablet (40 mg total) by mouth 2 (two) times daily.  Dispense: 60 tablet; Refill: 3  5. Cellulitis of leg, left Try Augmentin 3 times a day and return to clinic 1 week.  Recheck leg in 1 week.  6. Diabetes mellitus type 2 in obese ([place])   7. Encephalopathy, hepatic (HCC) Improved on lactulose.  8. Class 3 severe obesity due to excess calories with serious comorbidity and body mass index (BMI) of 40.0 to 44.9 in adult [facility]) More than 50% of 45-minute visit spent in counseling and/or coordination of care.  9. NAFLD (nonalcoholic fatty liver  disease) Etiology of many of the above problems.     Richard Cranford Mon, MD  [facility]

## 2019-03-21 NOTE — Patient Instructions (Signed)
Stop Keflex.

## 2019-03-22 ENCOUNTER — Other Ambulatory Visit: Payer: Self-pay

## 2019-03-22 ENCOUNTER — Telehealth: Payer: Self-pay

## 2019-03-22 MED ORDER — VITAMIN D (ERGOCALCIFEROL) 1.25 MG (50000 UNIT) PO CAPS: 50000.0000 [IU] | ORAL_CAPSULE | ORAL | 3 refills | Status: DC

## 2019-03-22 NOTE — Telephone Encounter (Signed)
Ok to rfill--1 year--thanks

## 2019-03-22 NOTE — Telephone Encounter (Signed)
Patient called stating that he forgot to mention yesterday in the office that he needs a refill on Vitamin D 50,000 units. This medication is not listed in patients chart. Please advise.[facility]. [address].

## 2019-03-22 NOTE — Telephone Encounter (Signed)
Please advise 

## 2019-03-23 ENCOUNTER — Other Ambulatory Visit: Payer: Self-pay

## 2019-03-23 ENCOUNTER — Ambulatory Visit
Admission: RE | Admit: 2019-03-23 | Discharge: 2019-03-23 | Disposition: A | Discharge status: Home / Self Care | Payer: BC Managed Care – PPO | Source: Ambulatory Visit | Attending: Family Medicine | Admitting: Family Medicine

## 2019-03-23 DIAGNOSIS — R600 Localized edema: Secondary | ICD-10-CM | POA: Diagnosis not present

## 2019-03-23 DIAGNOSIS — M7989 Other specified soft tissue disorders: Secondary | ICD-10-CM | POA: Diagnosis not present

## 2019-03-23 LAB — URINE CULTURE: Organism ID, Bacteria: NO GROWTH

## 2019-03-26 ENCOUNTER — Telehealth: Payer: Self-pay

## 2019-03-26 NOTE — Telephone Encounter (Signed)
lmtcb

## 2019-03-26 NOTE — Telephone Encounter (Signed)
Patient advised as below.  

## 2019-03-26 NOTE — Telephone Encounter (Signed)
-----   Message from Jerrol Banana., MD sent at 03/25/2019  9:30 AM EDT ----- No DVT

## 2019-03-26 NOTE — Telephone Encounter (Signed)
-----   Message from Jerrol Banana., MD sent at 03/23/2019 10:13 AM EDT ----- Urine ok

## 2019-03-28 ENCOUNTER — Ambulatory Visit (INDEPENDENT_AMBULATORY_CARE_PROVIDER_SITE_OTHER): Payer: BC Managed Care – PPO | Admitting: Family Medicine

## 2019-03-28 ENCOUNTER — Encounter: Payer: Self-pay | Admitting: Family Medicine

## 2019-03-28 ENCOUNTER — Other Ambulatory Visit: Payer: Self-pay

## 2019-03-28 VITALS — BP 112/69 | HR 67 | Temp 97.5°F | Resp 16 | Wt 327.0 lb

## 2019-03-28 DIAGNOSIS — L03116 Cellulitis of left lower limb: Secondary | ICD-10-CM | POA: Diagnosis not present

## 2019-03-28 DIAGNOSIS — M791 Myalgia, unspecified site: Secondary | ICD-10-CM

## 2019-03-28 DIAGNOSIS — D696 Thrombocytopenia, unspecified: Secondary | ICD-10-CM

## 2019-03-28 DIAGNOSIS — K760 Fatty (change of) liver, not elsewhere classified: Secondary | ICD-10-CM

## 2019-03-28 DIAGNOSIS — K7460 Unspecified cirrhosis of liver: Secondary | ICD-10-CM

## 2019-03-28 DIAGNOSIS — Z23 Encounter for immunization: Secondary | ICD-10-CM

## 2019-03-28 DIAGNOSIS — R188 Other ascites: Secondary | ICD-10-CM

## 2019-03-28 DIAGNOSIS — Z6841 Body Mass Index (BMI) 40.0 and over, adult: Secondary | ICD-10-CM

## 2019-03-28 DIAGNOSIS — N390 Urinary tract infection, site not specified: Secondary | ICD-10-CM | POA: Diagnosis not present

## 2019-03-28 MED ORDER — AMOXICILLIN-POT CLAVULANATE 875-125 MG PO TABS: 1.0000 | ORAL_TABLET | Freq: Two times a day (BID) | ORAL | 0 refills | Status: DC

## 2019-03-28 NOTE — Patient Instructions (Addendum)
Stop Metolazone.

## 2019-03-28 NOTE — Progress Notes (Signed)
Patient: Jesse Zimmerman Male    DOB: 05-25-1961   58 y.o.   MRN: [id number] Visit Date: 03/28/2019  Today's Provider: Wilhemena Durie, MD   Chief Complaint  Patient presents with  . Follow-up   Subjective:     HPI  Follow up for Cellulitis of left leg:   The patient was last seen for this 1 weeks ago. Changes made at last visit include starting Augmentin.  He reports good compliance with treatment. He feels that condition is Improved. He is not having side effects.   ------------------------------------------------------------------------------------   Allergies  Allergen Reactions  . Latex     LATEX TAPE  . Pineapple Hives     Current Outpatient Medications:  .  acetaminophen (TYLENOL) 325 MG tablet, Take 650 mg by mouth every 6 (six) hours as needed for mild pain or fever., Disp: , Rfl:  .  amoxicillin-clavulanate (AUGMENTIN) 875-125 MG tablet, Take 1 tablet by mouth 2 (two) times daily., Disp: 30 tablet, Rfl: 0 .  azithromycin (ZITHROMAX) 250 MG tablet, Take 1 tablet (250 mg total) by mouth daily., Disp: 6 tablet, Rfl: 0 .  benzonatate (TESSALON PERLES) 100 MG capsule, Take 1 capsule (100 mg total) by mouth 3 (three) times daily as needed for cough., Disp: 30 capsule, Rfl: 0 .  Canagliflozin-metFORMIN HCl ER (INVOKAMET XR) 50-1000 MG TB24, Take 1 tablet by mouth daily., Disp: 120 tablet, Rfl: 3 .  cephALEXin (KEFLEX) 500 MG capsule, Take 1 capsule (500 mg total) by mouth 4 (four) times daily for 10 days., Disp: 40 capsule, Rfl: 0 .  furosemide (LASIX) 40 MG tablet, Take 1 tablet (40 mg total) by mouth 2 (two) times daily., Disp: 60 tablet, Rfl: 3 .  hydrocortisone (ANUSOL-HC) 25 MG suppository, Place 1 suppository (25 mg total) rectally at bedtime as needed for hemorrhoids or anal itching., Disp: 12 suppository, Rfl: 5 .  ibuprofen (ADVIL,MOTRIN) 200 MG tablet, Take 200 mg by mouth every 6 (six) hours as needed for moderate pain., Disp: , Rfl:  .   lactulose (CONSTULOSE) 10 GM/15ML solution, Take 15 mLs (10 g total) by mouth 3 (three) times daily., Disp: 236 mL, Rfl: 0 .  loratadine (CLARITIN) 10 MG tablet, Take 1 tablet (10 mg total) by mouth daily., Disp: 30 tablet, Rfl: 11 .  meclizine (ANTIVERT) 25 MG tablet, Take 1 tablet (25 mg total) by mouth 3 (three) times daily as needed for dizziness., Disp: 60 tablet, Rfl: 1 .  metolazone (ZAROXOLYN) 2.5 MG tablet, Take 1 tablet (2.5 mg total) by mouth daily., Disp: 30 tablet, Rfl: 5 .  ondansetron (ZOFRAN ODT) 4 MG disintegrating tablet, Take 1 tablet (4 mg total) by mouth every 8 (eight) hours as needed for nausea or vomiting., Disp: 20 tablet, Rfl: 0 .  ondansetron (ZOFRAN) 4 MG tablet, Take 1 tablet (4 mg total) by mouth every 8 (eight) hours as needed for nausea or vomiting., Disp: 45 tablet, Rfl: 1 .  promethazine (PHENERGAN) 25 MG tablet, Take 1 tablet (25 mg total) by mouth every 8 (eight) hours as needed for nausea or vomiting., Disp: 60 tablet, Rfl: 1 .  propranolol (INDERAL) 20 MG tablet, Take 20 mg by mouth 2 (two) times daily. , Disp: , Rfl:  .  spironolactone (ALDACTONE) 100 MG tablet, Take 1 tablet (100 mg total) by mouth 2 (two) times daily. Take 50 mg BID for 1 week and then increase to 100 mg BID, Disp: 60 tablet, Rfl: 3 .  tamsulosin (FLOMAX) 0.4 MG CAPS capsule, Take 1 capsule (0.4 mg total) by mouth daily., Disp: 90 capsule, Rfl: 3 .  traMADol (ULTRAM) 50 MG tablet, Take 1 to 2 tablets every 8 hours and as needed for pain, Disp: 100 tablet, Rfl: 1 .  Vitamin D, Ergocalciferol, (DRISDOL) 1.25 MG (50000 UT) CAPS capsule, Take 1 capsule (50,000 Units total) by mouth every 7 (seven) days., Disp: 12 capsule, Rfl: 3  Review of Systems  Constitutional: Negative for appetite change, chills and fever.  Eyes: Negative.   Respiratory: Positive for cough. Negative for chest tightness, shortness of breath and wheezing.   Cardiovascular: Positive for leg swelling (upper left leg). Negative  for chest pain and palpitations.  Gastrointestinal: Negative.  Negative for abdominal pain, nausea and vomiting.  Endocrine: Negative.   Allergic/Immunologic: Negative.   Neurological: Positive for dizziness.  Psychiatric/Behavioral: Negative.     Social History   Tobacco Use  . Smoking status: Never Smoker  . Smokeless tobacco: Never Used  Substance Use Topics  . Alcohol use: No      Objective:   BP 112/69 (BP Location: Right Arm, Cuff Size: Normal)   Pulse 67   Temp (!) 97.5 F (36.4 C) (Temporal)   Resp 16   Wt (!) 327 lb (148.3 kg)   SpO2 96% Comment: room air  BMI 44.35 kg/m  Vitals:   03/28/19 1531 03/28/19 1534  BP: (!) 98/58 112/69  Pulse: 67   Resp: 16   Temp: (!) 97.5 F (36.4 C)   TempSrc: Temporal   SpO2: 96%   Weight: (!) 327 lb (148.3 kg)   Body mass index is 44.35 kg/m.   Physical Exam Vitals signs reviewed.  Constitutional:      Appearance: Normal appearance. He is obese.  HENT:     Head: Normocephalic and atraumatic.     Right Ear: External ear normal.     Left Ear: External ear normal.     Nose: Nose normal.     Mouth/Throat:     Pharynx: Oropharynx is clear.  Eyes:     General: No scleral icterus.    Conjunctiva/sclera: Conjunctivae normal.  Neck:     Comments: No JVD noted. Cardiovascular:     Rate and Rhythm: Normal rate and regular rhythm.     Pulses: Normal pulses.     Heart sounds: Normal heart sounds.  Pulmonary:     Effort: Pulmonary effort is normal.  Abdominal:     Palpations: Abdomen is soft.     Tenderness: There is no abdominal tenderness.  Musculoskeletal:     Comments: 1+  edema.  Skin:    General: Skin is warm and dry.     Comments:  upper inner thigh with mild induration and warmth.  No real tenderness.  Neurological:     General: No focal deficit present.     Mental Status: He is alert and oriented to person, place, and time. Mental status is at baseline.  Psychiatric:        Mood and Affect: Mood normal.         Behavior: Behavior normal.        Thought Content: Thought content normal.        Judgment: Judgment normal.      No results found for any visits on 03/28/19.     Assessment & Plan    1. Cellulitis of leg, left Resolving. - CBC with Differential/Platelet - Comprehensive metabolic panel  2. Myalgia  - CK  3. Flu vaccine need  - Flu Vaccine QUAD 6+ mos PF IM (Fluarix Quad PF)  4. Urinary tract infection without hematuria, site unspecified  - amoxicillin-clavulanate (AUGMENTIN) 875-125 MG tablet; Take 1 tablet by mouth 2 (two) times daily.  Dispense: 30 tablet; Refill: 0  5. Thrombocytopenia ([place]) Due to cirrhosis  6.  anemia ([place])   7. NAFLD (nonalcoholic fatty liver disease)   8. Class 3 severe obesity due to excess calories with serious comorbidity and body mass index (BMI) of 40.0 to 44.9 in adult ([place]) 9.Cirrhosis Pt followed by [facility] hepatology Not on transplant list at this time.     Adryel Wortmann Cranford Mon, MD  [facility]

## 2019-03-29 DIAGNOSIS — K7581 Nonalcoholic steatohepatitis (NASH): Secondary | ICD-10-CM | POA: Diagnosis not present

## 2019-03-29 DIAGNOSIS — K7460 Unspecified cirrhosis of liver: Secondary | ICD-10-CM | POA: Diagnosis not present

## 2019-03-29 DIAGNOSIS — L03116 Cellulitis of left lower limb: Secondary | ICD-10-CM | POA: Diagnosis not present

## 2019-03-29 DIAGNOSIS — M791 Myalgia, unspecified site: Secondary | ICD-10-CM | POA: Diagnosis not present

## 2019-03-30 LAB — COMPREHENSIVE METABOLIC PANEL
ALT: 21 IU/L (ref 0–44)
AST: 38 IU/L (ref 0–40)
Albumin/Globulin Ratio: 0.7 — ABNORMAL LOW (ref 1.2–2.2)
Albumin: 2.4 g/dL — ABNORMAL LOW (ref 3.8–4.9)
Alkaline Phosphatase: 120 IU/L — ABNORMAL HIGH (ref 39–117)
BUN/Creatinine Ratio: 9 (ref 9–20)
BUN: 11 mg/dL (ref 6–24)
Bilirubin Total: 1.7 mg/dL — ABNORMAL HIGH (ref 0.0–1.2)
CO2: 27 mmol/L (ref 20–29)
Calcium: 8.6 mg/dL — ABNORMAL LOW (ref 8.7–10.2)
Chloride: 99 mmol/L (ref 96–106)
Creatinine, Ser: 1.2 mg/dL (ref 0.76–1.27)
GFR calc Af Amer: 77 mL/min/{1.73_m2} (ref >59)
GFR calc non Af Amer: 67 mL/min/{1.73_m2} (ref >59)
Globulin, Total: 3.6 g/dL (ref 1.5–4.5)
Glucose: 318 mg/dL — ABNORMAL HIGH (ref 65–99)
Potassium: 4.1 mmol/L (ref 3.5–5.2)
Sodium: 134 mmol/L (ref 134–144)
Total Protein: 6 g/dL (ref 6.0–8.5)

## 2019-03-30 LAB — CBC WITH DIFFERENTIAL/PLATELET
Basophils Absolute: 0 10*3/uL (ref 0.0–0.2)
Basos: 1 %
EOS (ABSOLUTE): 0.2 10*3/uL (ref 0.0–0.4)
Eos: 5 %
Hematocrit: 27.7 % — ABNORMAL LOW (ref 37.5–51.0)
Hemoglobin: 9.3 g/dL — ABNORMAL LOW (ref 13.0–17.7)
Immature Grans (Abs): 0 10*3/uL (ref 0.0–0.1)
Immature Granulocytes: 0 %
Lymphocytes Absolute: 0.7 10*3/uL (ref 0.7–3.1)
Lymphs: 17 %
MCH: 32.6 pg (ref 26.6–33.0)
MCHC: 33.6 g/dL (ref 31.5–35.7)
MCV: 97 fL (ref 79–97)
Monocytes Absolute: 0.3 10*3/uL (ref 0.1–0.9)
Monocytes: 8 %
Neutrophils Absolute: 3 10*3/uL (ref 1.4–7.0)
Neutrophils: 69 %
Platelets: 50 10*3/uL — CL (ref 150–450)
RBC: 2.85 x10E6/uL — ABNORMAL LOW (ref 4.14–5.80)
RDW: 13.1 % (ref 11.6–15.4)
WBC: 4.2 10*3/uL (ref 3.4–10.8)

## 2019-03-30 LAB — CK: Total CK: 219 U/L (ref 41–331)

## 2019-04-02 ENCOUNTER — Telehealth: Payer: Self-pay

## 2019-04-02 NOTE — Telephone Encounter (Signed)
Patient was notified of results. Expressed understanding.

## 2019-04-02 NOTE — Telephone Encounter (Signed)
Left message to call back  

## 2019-04-02 NOTE — Telephone Encounter (Signed)
-----   Message from Jerrol Banana., MD sent at 04/02/2019 11:58 AM EDT ----- Labs stable.  Liver functions a little bit better.

## 2019-04-05 ENCOUNTER — Ambulatory Visit: Payer: Self-pay | Admitting: Family Medicine

## 2019-04-26 LAB — BLOOD GAS, VENOUS
Acid-Base Excess: 2.5 mmol/L — ABNORMAL HIGH (ref 0.0–2.0)
Bicarbonate: 28.4 mmol/L — ABNORMAL HIGH (ref 20.0–28.0)
O2 Saturation: 58 %
Patient temperature: 37
pCO2, Ven: 48 mmHg (ref 44.0–60.0)
pH, Ven: 7.38 (ref 7.250–7.430)

## 2019-05-24 DIAGNOSIS — K7581 Nonalcoholic steatohepatitis (NASH): Secondary | ICD-10-CM | POA: Diagnosis not present

## 2019-05-24 DIAGNOSIS — K7460 Unspecified cirrhosis of liver: Secondary | ICD-10-CM | POA: Diagnosis not present

## 2019-06-11 DIAGNOSIS — K7581 Nonalcoholic steatohepatitis (NASH): Secondary | ICD-10-CM | POA: Diagnosis not present

## 2019-06-11 DIAGNOSIS — K766 Portal hypertension: Secondary | ICD-10-CM | POA: Diagnosis not present

## 2019-06-11 DIAGNOSIS — K7460 Unspecified cirrhosis of liver: Secondary | ICD-10-CM | POA: Diagnosis not present

## 2019-06-11 DIAGNOSIS — R188 Other ascites: Secondary | ICD-10-CM | POA: Diagnosis not present

## 2019-06-11 DIAGNOSIS — I8500 Esophageal varices without bleeding: Secondary | ICD-10-CM | POA: Diagnosis not present

## 2019-08-15 ENCOUNTER — Encounter: Payer: Self-pay | Admitting: Family Medicine

## 2019-08-15 ENCOUNTER — Other Ambulatory Visit: Payer: Self-pay

## 2019-08-15 ENCOUNTER — Ambulatory Visit: Payer: BC Managed Care – PPO | Admitting: Family Medicine

## 2019-08-15 VITALS — BP 121/86 | HR 79 | Temp 96.9°F | Resp 16 | Ht 72.0 in | Wt 312.4 lb

## 2019-08-15 DIAGNOSIS — E1169 Type 2 diabetes mellitus with other specified complication: Secondary | ICD-10-CM | POA: Diagnosis not present

## 2019-08-15 DIAGNOSIS — R5383 Other fatigue: Secondary | ICD-10-CM

## 2019-08-15 DIAGNOSIS — E559 Vitamin D deficiency, unspecified: Secondary | ICD-10-CM

## 2019-08-15 DIAGNOSIS — D696 Thrombocytopenia, unspecified: Secondary | ICD-10-CM

## 2019-08-15 DIAGNOSIS — E669 Obesity, unspecified: Secondary | ICD-10-CM | POA: Diagnosis not present

## 2019-08-15 DIAGNOSIS — K7460 Unspecified cirrhosis of liver: Secondary | ICD-10-CM

## 2019-08-15 DIAGNOSIS — R188 Other ascites: Secondary | ICD-10-CM | POA: Diagnosis not present

## 2019-08-15 DIAGNOSIS — I10 Essential (primary) hypertension: Secondary | ICD-10-CM | POA: Diagnosis not present

## 2019-08-15 DIAGNOSIS — E119 Type 2 diabetes mellitus without complications: Secondary | ICD-10-CM

## 2019-08-15 DIAGNOSIS — M25561 Pain in right knee: Secondary | ICD-10-CM | POA: Diagnosis not present

## 2019-08-15 DIAGNOSIS — W19XXXA Unspecified fall, initial encounter: Secondary | ICD-10-CM

## 2019-08-15 MED ORDER — HYDROCODONE-ACETAMINOPHEN 5-325 MG PO TABS: 1.0000 | ORAL_TABLET | ORAL | 0 refills | Status: DC | PRN

## 2019-08-15 NOTE — Progress Notes (Signed)
Patient: Jesse Zimmerman Male    DOB: Oct 13, 1960   59 y.o.   MRN: [id number] Visit Date: 08/15/2019  Today's Provider: Wilhemena Durie, MD   Chief Complaint  Patient presents with  . Knee Pain   Subjective:    I [place] S. Justn Quale, CMA, am acting as scribe for Wilhemena Durie, MD.  Knee Pain  The incident occurred 3 to 5 days ago. The incident occurred at home. The injury mechanism was a direct blow and a fall. The pain is present in the right ankle, right knee and right leg. The quality of the pain is described as aching. The pain is at a severity of 6/10. The pain is moderate. The pain has been constant since onset. Associated symptoms include muscle weakness. He reports no foreign bodies present. The symptoms are aggravated by movement and weight bearing. He has tried ice and heat for the symptoms. The treatment provided mild relief.  He fell at work on 08/04/2019 and landed on metal steps directly on his right knee.  He has had pain there since then.  Mild amount of swelling.  It hurts to bear weight.  Follow up for blood pressure/Cirrhosis  The patient was last seen for this 2 months ago. Patient was seen by Dr. Loletha Grayer at [facility]. Changes made at last visit include increase furosemide from 66m to 630m Patient reports that he increased Spironolactone from 5067mo 100m65mily. Patient reports that now he is unsure of which medication he was to increase.  He reports fair compliance with treatment. He feels that condition is Unchanged. He is having side effects. Feeling very drained.  ------------------------------------------------------------------------------------   Allergies  Allergen Reactions  . Latex     LATEX TAPE  . Pineapple Hives     Current Outpatient Medications:  .  Canagliflozin-metFORMIN HCl ER (INVOKAMET XR) 50-1000 MG TB24, Take 1 tablet by mouth daily., Disp: 120 tablet, Rfl: 3 .  furosemide (LASIX) 40 MG tablet, Take 1 tablet (40 mg total) by  mouth 2 (two) times daily., Disp: 60 tablet, Rfl: 3 .  loratadine (CLARITIN) 10 MG tablet, Take 1 tablet (10 mg total) by mouth daily., Disp: 30 tablet, Rfl: 11 .  propranolol (INDERAL) 20 MG tablet, Take 20 mg by mouth 2 (two) times daily. , Disp: , Rfl:  .  spironolactone (ALDACTONE) 100 MG tablet, Take 1 tablet (100 mg total) by mouth 2 (two) times daily. Take 50 mg BID for 1 week and then increase to 100 mg BID (Patient taking differently: Take 100 mg by mouth daily. Take 50 mg BID for 1 week and then increase to 100 mg BID), Disp: 60 tablet, Rfl: 3 .  tamsulosin (FLOMAX) 0.4 MG CAPS capsule, Take 1 capsule (0.4 mg total) by mouth daily. (Patient taking differently: Take 0.4 mg by mouth as needed. ), Disp: 90 capsule, Rfl: 3 .  Vitamin D, Ergocalciferol, (DRISDOL) 1.25 MG (50000 UT) CAPS capsule, Take 1 capsule (50,000 Units total) by mouth every 7 (seven) days., Disp: 12 capsule, Rfl: 3 .  hydrocortisone (ANUSOL-HC) 25 MG suppository, Place 1 suppository (25 mg total) rectally at bedtime as needed for hemorrhoids or anal itching. (Patient not taking: Reported on 08/15/2019), Disp: 12 suppository, Rfl: 5 .  lactulose (CONSTULOSE) 10 GM/15ML solution, Take 15 mLs (10 g total) by mouth 3 (three) times daily. (Patient not taking: Reported on 08/15/2019), Disp: 236 mL, Rfl: 0 .  meclizine (ANTIVERT) 25 MG tablet, Take 1 tablet (  25 mg total) by mouth 3 (three) times daily as needed for dizziness. (Patient not taking: Reported on 08/15/2019), Disp: 60 tablet, Rfl: 1 .  metolazone (ZAROXOLYN) 2.5 MG tablet, Take 1 tablet (2.5 mg total) by mouth daily. (Patient not taking: Reported on 08/15/2019), Disp: 30 tablet, Rfl: 5 .  ondansetron (ZOFRAN ODT) 4 MG disintegrating tablet, Take 1 tablet (4 mg total) by mouth every 8 (eight) hours as needed for nausea or vomiting. (Patient not taking: Reported on 08/15/2019), Disp: 20 tablet, Rfl: 0 .  ondansetron (ZOFRAN) 4 MG tablet, Take 1 tablet (4 mg total) by mouth every 8  (eight) hours as needed for nausea or vomiting. (Patient not taking: Reported on 08/15/2019), Disp: 45 tablet, Rfl: 1 .  promethazine (PHENERGAN) 25 MG tablet, Take 1 tablet (25 mg total) by mouth every 8 (eight) hours as needed for nausea or vomiting. (Patient not taking: Reported on 08/15/2019), Disp: 60 tablet, Rfl: 1 .  traMADol (ULTRAM) 50 MG tablet, Take 1 to 2 tablets every 8 hours and as needed for pain (Patient not taking: Reported on 08/15/2019), Disp: 100 tablet, Rfl: 1  Review of Systems  Constitutional: Positive for fatigue and unexpected weight change. Negative for chills.  Eyes: Negative.   Respiratory: Negative for shortness of breath.   Cardiovascular: Positive for leg swelling. Negative for chest pain and palpitations.  Endocrine: Negative.   Musculoskeletal: Positive for myalgias.  Skin: Negative.   Allergic/Immunologic: Negative.   Hematological: Negative.   Psychiatric/Behavioral: Negative.     Social History   Tobacco Use  . Smoking status: Never Smoker  . Smokeless tobacco: Never Used  Substance Use Topics  . Alcohol use: No      Objective:   BP 121/86 (BP Location: Right Arm, Patient Position: Sitting, Cuff Size: Large)   Pulse 79   Temp (!) 96.9 F (36.1 C) (Temporal)   Resp 16   Ht 6' (1.829 m)   Wt (!) 312 lb 6.4 oz (141.7 kg)   BMI 42.37 kg/m  Vitals:   08/15/19 1404  BP: 121/86  Pulse: 79  Resp: 16  Temp: (!) 96.9 F (36.1 C)  TempSrc: Temporal  Weight: (!) 312 lb 6.4 oz (141.7 kg)  Height: 6' (1.829 m)  Body mass index is 42.37 kg/m.   Physical Exam Vitals reviewed.  Constitutional:      Appearance: Normal appearance. He is obese.  HENT:     Head: Normocephalic and atraumatic.     Right Ear: External ear normal.     Left Ear: External ear normal.     Nose: Nose normal.     Mouth/Throat:     Pharynx: Oropharynx is clear.  Eyes:     General: No scleral icterus.    Conjunctiva/sclera: Conjunctivae normal.  Neck:     Comments:  No JVD noted. Cardiovascular:     Rate and Rhythm: Normal rate and regular rhythm.     Pulses: Normal pulses.     Heart sounds: Normal heart sounds.  Pulmonary:     Effort: Pulmonary effort is normal.  Abdominal:     Palpations: Abdomen is soft.     Tenderness: There is no abdominal tenderness.  Musculoskeletal:        General: Tenderness present.     Comments: 1+  Edema. He has mild effusion of the knee.  The right patella appears to be nontender.  He is tender over the patellar tendon and the tibial tuberosity on the right.  The joint itself  appears to be stable.  Skin:    General: Skin is warm and dry.     Comments: Erythema of upper inner thigh with mild induration and warmth.  No real tenderness.  Neurological:     General: No focal deficit present.     Mental Status: He is alert and oriented to person, place, and time. Mental status is at baseline.  Psychiatric:        Mood and Affect: Mood normal.        Behavior: Behavior normal.        Thought Content: Thought content normal.        Judgment: Judgment normal.      No results found for any visits on 08/15/19.     Assessment & Plan    1. Fall, initial encounter X-ray knee.  Switch from ice to heat.  Need orthopedic referral if that does not resolve spontaneously over the next couple of weeks.  I think he has a significant contusion of the area from the fall. - DG Knee Complete 4 Views Right; Future - HYDROcodone-acetaminophen (NORCO) 5-325 MG tablet; Take 1 tablet by mouth every 4 (four) hours as needed for moderate pain.  Dispense: 25 tablet; Refill: 0  2. Acute pain of right knee See above. - DG Knee Complete 4 Views Right; Future - HYDROcodone-acetaminophen (NORCO) 5-325 MG tablet; Take 1 tablet by mouth every 4 (four) hours as needed for moderate pain.  Dispense: 25 tablet; Refill: 0  3. Cirrhosis of liver with ascites, unspecified hepatic cirrhosis type ([place]) [facility].  His weight is down 15  pounds since last visit here.  This is with the increase in diuretics per [facility].  We may have to go up on the diuretics in the future but he is clinically improving. Of note he is planning to retire 02/26/2020 to be able to take better care of himself. - CBC with Differential/Platelet - Comprehensive metabolic panel  4. Diabetes mellitus type 2 in obese (HCC)  - Hemoglobin A1c - TSH  5. Essential hypertension Blood pressure is good. - TSH  6. Fatigue, unspecified type Multifactorial. - TSH  7. Avitaminosis D  - VITAMIN D 25 Hydroxy (Vit-D Deficiency, Fractures)  8. Thrombocytopenia ([place])      Wilhemena Durie, MD  [facility]

## 2019-08-16 ENCOUNTER — Ambulatory Visit: Payer: BC Managed Care – PPO | Attending: Internal Medicine

## 2019-08-16 DIAGNOSIS — Z23 Encounter for immunization: Secondary | ICD-10-CM | POA: Insufficient documentation

## 2019-08-16 LAB — COMPREHENSIVE METABOLIC PANEL
ALT: 23 IU/L (ref 0–44)
AST: 36 IU/L (ref 0–40)
Albumin/Globulin Ratio: 0.9 — ABNORMAL LOW (ref 1.2–2.2)
Albumin: 3 g/dL — ABNORMAL LOW (ref 3.8–4.9)
Alkaline Phosphatase: 153 IU/L — ABNORMAL HIGH (ref 39–117)
BUN/Creatinine Ratio: 11 (ref 9–20)
BUN: 15 mg/dL (ref 6–24)
Bilirubin Total: 1.8 mg/dL — ABNORMAL HIGH (ref 0.0–1.2)
CO2: 22 mmol/L (ref 20–29)
Calcium: 8.4 mg/dL — ABNORMAL LOW (ref 8.7–10.2)
Chloride: 101 mmol/L (ref 96–106)
Creatinine, Ser: 1.37 mg/dL — ABNORMAL HIGH (ref 0.76–1.27)
GFR calc Af Amer: 65 mL/min/{1.73_m2} (ref >59)
GFR calc non Af Amer: 56 mL/min/{1.73_m2} — ABNORMAL LOW (ref >59)
Globulin, Total: 3.3 g/dL (ref 1.5–4.5)
Glucose: 391 mg/dL — ABNORMAL HIGH (ref 65–99)
Potassium: 3.9 mmol/L (ref 3.5–5.2)
Sodium: 133 mmol/L — ABNORMAL LOW (ref 134–144)
Total Protein: 6.3 g/dL (ref 6.0–8.5)

## 2019-08-16 LAB — CBC WITH DIFFERENTIAL/PLATELET
Basophils Absolute: 0.1 10*3/uL (ref 0.0–0.2)
Basos: 1 %
EOS (ABSOLUTE): 0.4 10*3/uL (ref 0.0–0.4)
Eos: 8 %
Hematocrit: 27.7 % — ABNORMAL LOW (ref 37.5–51.0)
Hemoglobin: 10 g/dL — ABNORMAL LOW (ref 13.0–17.7)
Immature Grans (Abs): 0 10*3/uL (ref 0.0–0.1)
Immature Granulocytes: 0 %
Lymphocytes Absolute: 0.7 10*3/uL (ref 0.7–3.1)
Lymphs: 16 %
MCH: 34 pg — ABNORMAL HIGH (ref 26.6–33.0)
MCHC: 36.1 g/dL — ABNORMAL HIGH (ref 31.5–35.7)
MCV: 94 fL (ref 79–97)
Monocytes Absolute: 0.4 10*3/uL (ref 0.1–0.9)
Monocytes: 8 %
Neutrophils Absolute: 3 10*3/uL (ref 1.4–7.0)
Neutrophils: 67 %
Platelets: 47 10*3/uL — CL (ref 150–450)
RBC: 2.94 x10E6/uL — ABNORMAL LOW (ref 4.14–5.80)
RDW: 13.6 % (ref 11.6–15.4)
WBC: 4.5 10*3/uL (ref 3.4–10.8)

## 2019-08-16 LAB — HEMOGLOBIN A1C
Est. average glucose Bld gHb Est-mCnc: 194 mg/dL
Hgb A1c MFr Bld: 8.4 % — ABNORMAL HIGH (ref 4.8–5.6)

## 2019-08-16 LAB — VITAMIN D 25 HYDROXY (VIT D DEFICIENCY, FRACTURES): Vit D, 25-Hydroxy: 15.2 ng/mL — ABNORMAL LOW (ref 30.0–100.0)

## 2019-08-16 LAB — TSH: TSH: 2.29 u[IU]/mL (ref 0.450–4.500)

## 2019-08-16 NOTE — Progress Notes (Signed)
   Covid-19 Vaccination Clinic  Name:  MICIAH COVELLI    MRN: [id number] DOB: 1961/05/05  08/16/2019  Mr. Battie was observed post Covid-19 immunization for 15 minutes without incidence. He was provided with Vaccine Information Sheet and instruction to access the V-Safe system.   Mr. Gonzalez was instructed to call 911 with any severe reactions post vaccine: Marland Kitchen Difficulty breathing  . Swelling of your face and throat  . A fast heartbeat  . A bad rash all over your body  . Dizziness and weakness    Immunizations Administered    Name Date Dose VIS Date Route   Pfizer COVID-19 Vaccine 08/16/2019 11:20 AM 0.3 mL 06/01/2019 Intramuscular   Manufacturer: [place]   Lot: [id number]   [place]: [id number]

## 2019-08-20 ENCOUNTER — Telehealth: Payer: Self-pay

## 2019-08-20 NOTE — Telephone Encounter (Signed)
Called to advise patient of lab results, LVMTCB.

## 2019-08-20 NOTE — Telephone Encounter (Signed)
-----   Message from Jerrol Banana., MD sent at 08/20/2019 12:48 PM EST ----- Labs stable, diabetes slightly worse.  Work on diet and exercise as discussed.

## 2019-08-24 ENCOUNTER — Other Ambulatory Visit: Payer: Self-pay | Admitting: Family Medicine

## 2019-08-24 DIAGNOSIS — E1169 Type 2 diabetes mellitus with other specified complication: Secondary | ICD-10-CM

## 2019-08-24 MED ORDER — INVOKAMET XR 50-1000 MG PO TB24: 1.0000 | ORAL_TABLET | Freq: Every day | ORAL | 3 refills | Status: DC

## 2019-08-24 NOTE — Telephone Encounter (Signed)
[facility] faxed refill request for the following medications:  INVOKAMET XR 50-1000 MG TB24  Last Rx: 05/02/2018 LOV: 08/15/2019 NOV: 11/14/2019 Please advise. Thanks TNP

## 2019-08-31 ENCOUNTER — Telehealth: Payer: Self-pay | Admitting: Family Medicine

## 2019-08-31 ENCOUNTER — Other Ambulatory Visit: Payer: Self-pay | Admitting: *Deleted

## 2019-08-31 DIAGNOSIS — E1169 Type 2 diabetes mellitus with other specified complication: Secondary | ICD-10-CM

## 2019-08-31 DIAGNOSIS — E669 Obesity, unspecified: Secondary | ICD-10-CM

## 2019-08-31 MED ORDER — SYNJARDY XR 10-1000 MG PO TB24: 1.0000 | ORAL_TABLET | Freq: Every day | ORAL | 3 refills | Status: DC

## 2019-08-31 NOTE — Telephone Encounter (Signed)
Pharmacy states they have two active older rx for spironolactone (ALDACTONE) 100 MG tablet  And 83m tabs  They need clarification on what the Pt should be taking/ they stated the Pt is asking for the older rx / please advise

## 2019-09-03 NOTE — Telephone Encounter (Signed)
Advised pharmacy.

## 2019-09-03 NOTE — Telephone Encounter (Signed)
Please advise rx?

## 2019-09-03 NOTE — Telephone Encounter (Signed)
156m dose is correct.

## 2019-09-12 ENCOUNTER — Ambulatory Visit: Payer: BC Managed Care – PPO | Attending: Internal Medicine

## 2019-09-12 DIAGNOSIS — Z23 Encounter for immunization: Secondary | ICD-10-CM

## 2019-09-12 NOTE — Progress Notes (Signed)
   Covid-19 Vaccination Clinic  Name:  Jesse Zimmerman    MRN: [id number] DOB: 03/20/1961  09/12/2019  Jesse Zimmerman was observed post Covid-19 immunization for 15 minutes without incident. He was provided with Vaccine Information Sheet and instruction to access the V-Safe system.   Jesse Zimmerman was instructed to call 911 with any severe reactions post vaccine: Marland Kitchen Difficulty breathing  . Swelling of face and throat  . A fast heartbeat  . A bad rash all over body  . Dizziness and weakness   Immunizations Administered    Name Date Dose VIS Date Route   Pfizer COVID-19 Vaccine 09/12/2019  8:44 AM 0.3 mL 06/01/2019 Intramuscular   Manufacturer: [place]   Lot: [id number]   [place]: [id number]

## 2019-09-19 DIAGNOSIS — K766 Portal hypertension: Secondary | ICD-10-CM | POA: Diagnosis not present

## 2019-09-19 DIAGNOSIS — K7460 Unspecified cirrhosis of liver: Secondary | ICD-10-CM | POA: Diagnosis not present

## 2019-09-19 DIAGNOSIS — K7581 Nonalcoholic steatohepatitis (NASH): Secondary | ICD-10-CM | POA: Diagnosis not present

## 2019-10-12 ENCOUNTER — Other Ambulatory Visit: Payer: Self-pay | Admitting: Family Medicine

## 2019-10-12 DIAGNOSIS — J309 Allergic rhinitis, unspecified: Secondary | ICD-10-CM

## 2019-10-17 ENCOUNTER — Ambulatory Visit: Payer: Self-pay | Admitting: Family Medicine

## 2019-10-17 DIAGNOSIS — S2241XA Multiple fractures of ribs, right side, initial encounter for closed fracture: Secondary | ICD-10-CM | POA: Diagnosis not present

## 2019-10-17 DIAGNOSIS — W19XXXA Unspecified fall, initial encounter: Secondary | ICD-10-CM

## 2019-10-17 DIAGNOSIS — K7460 Unspecified cirrhosis of liver: Secondary | ICD-10-CM | POA: Diagnosis not present

## 2019-10-17 DIAGNOSIS — K766 Portal hypertension: Secondary | ICD-10-CM | POA: Diagnosis not present

## 2019-10-17 DIAGNOSIS — K7581 Nonalcoholic steatohepatitis (NASH): Secondary | ICD-10-CM | POA: Diagnosis not present

## 2019-10-17 DIAGNOSIS — X58XXXA Exposure to other specified factors, initial encounter: Secondary | ICD-10-CM | POA: Diagnosis not present

## 2019-10-17 DIAGNOSIS — M549 Dorsalgia, unspecified: Secondary | ICD-10-CM | POA: Diagnosis not present

## 2019-10-17 DIAGNOSIS — I864 Gastric varices: Secondary | ICD-10-CM | POA: Diagnosis not present

## 2019-10-17 DIAGNOSIS — E119 Type 2 diabetes mellitus without complications: Secondary | ICD-10-CM | POA: Diagnosis not present

## 2019-10-17 DIAGNOSIS — I8500 Esophageal varices without bleeding: Secondary | ICD-10-CM | POA: Diagnosis not present

## 2019-10-17 DIAGNOSIS — R110 Nausea: Secondary | ICD-10-CM | POA: Diagnosis not present

## 2019-10-17 DIAGNOSIS — R1011 Right upper quadrant pain: Secondary | ICD-10-CM | POA: Diagnosis not present

## 2019-10-17 DIAGNOSIS — I10 Essential (primary) hypertension: Secondary | ICD-10-CM | POA: Diagnosis not present

## 2019-10-17 DIAGNOSIS — R918 Other nonspecific abnormal finding of lung field: Secondary | ICD-10-CM | POA: Diagnosis not present

## 2019-10-17 DIAGNOSIS — K8000 Calculus of gallbladder with acute cholecystitis without obstruction: Secondary | ICD-10-CM | POA: Diagnosis not present

## 2019-10-17 DIAGNOSIS — K8010 Calculus of gallbladder with chronic cholecystitis without obstruction: Secondary | ICD-10-CM | POA: Diagnosis not present

## 2019-10-17 DIAGNOSIS — M25561 Pain in right knee: Secondary | ICD-10-CM

## 2019-10-17 NOTE — Telephone Encounter (Signed)
Spoke to patient and he said that he can come in tomorrow at 2:40 for his rib pain and will send prescription in to pharmacy.

## 2019-10-17 NOTE — Telephone Encounter (Signed)
Pt reports fell "Getting out of hot tub last Wednesday, 1 week ago."  Bruising from rib area to waist, around to back. No lacerations. States severe right sided rib area pain since fall, no worsening. Pt states "Thought I could tough it out." States had just left [facility] for unrelated issue and CXR was done, pt was advised they would post results for him to view. Pt calling to question if PCP would call in either percocet or Vicodin for pain control. Pt made aware would most likely require appt, office closed presently for lunch. Pt requesting NT ask for med pending CXR results from [facility]. Assured pt NT would route to practice for PCP's review. Care advise given per protocol, advised ED for any SOB, symptoms worsen. CB# [phone]  Reason for Disposition . [1] Chest wall swelling or pain AND [2] present > 7 days  Answer Assessment - Initial Assessment Questions 1. MECHANISM: "How did the injury happen?"     Fall 2. ONSET: "When did the injury happen?" (Minutes or hours ago)    Last Wednesday 3. LOCATION: "Where on the chest is the injury located?"     Right sided, rib cage area 4. APPEARANCE: "What does the injury look like?"     "Bruising from ribs to waist, around to back 5. BLEEDING: "Is there any bleeding now? If so, ask: How long has it been bleeding?"     no 6. SEVERITY: "Any difficulty with breathing?"     "Painful to take a deep breath, no SOB" 7. SIZE: For cuts, bruises, or swelling, ask: "How large is it?" (e.g., inches or centimeters)     "Entire right side pretty much" 8. PAIN: "Is there pain?" If so, ask: "How bad is the pain?"   (e.g., Scale 1-10; or mild, moderate, severe)    Severe 9. TETANUS: For any breaks in the skin, ask: "When was the last tetanus booster?"     NA  Protocols used: CHEST INJURY-A-AH

## 2019-10-17 NOTE — Telephone Encounter (Signed)
About appointment with me tomorrow at 11 AM or at 2:40 PM.  We can send him in Vicodin 5/325 every 4 hours as needed #25.  I will need to sign that controlled substance.  Thanks

## 2019-10-18 ENCOUNTER — Encounter: Payer: Self-pay | Admitting: Family Medicine

## 2019-10-18 ENCOUNTER — Other Ambulatory Visit: Payer: Self-pay

## 2019-10-18 ENCOUNTER — Ambulatory Visit: Payer: BC Managed Care – PPO | Admitting: Family Medicine

## 2019-10-18 VITALS — BP 108/67 | HR 65 | Temp 97.3°F | Resp 18 | Ht 72.0 in | Wt 313.0 lb

## 2019-10-18 DIAGNOSIS — W19XXXA Unspecified fall, initial encounter: Secondary | ICD-10-CM | POA: Diagnosis not present

## 2019-10-18 DIAGNOSIS — R059 Cough, unspecified: Secondary | ICD-10-CM

## 2019-10-18 DIAGNOSIS — R110 Nausea: Secondary | ICD-10-CM

## 2019-10-18 DIAGNOSIS — R0781 Pleurodynia: Secondary | ICD-10-CM

## 2019-10-18 DIAGNOSIS — R05 Cough: Secondary | ICD-10-CM

## 2019-10-18 DIAGNOSIS — K8000 Calculus of gallbladder with acute cholecystitis without obstruction: Secondary | ICD-10-CM | POA: Insufficient documentation

## 2019-10-18 DIAGNOSIS — M25561 Pain in right knee: Secondary | ICD-10-CM

## 2019-10-18 MED ORDER — PROMETHAZINE HCL 25 MG PO TABS: 25.0000 mg | ORAL_TABLET | Freq: Three times a day (TID) | ORAL | 1 refills | Status: DC | PRN

## 2019-10-18 MED ORDER — ALBUTEROL SULFATE HFA 108 (90 BASE) MCG/ACT IN AERS: 2.0000 | INHALATION_SPRAY | Freq: Four times a day (QID) | RESPIRATORY_TRACT | 1 refills | Status: DC | PRN

## 2019-10-18 MED ORDER — HYDROCODONE-ACETAMINOPHEN 5-325 MG PO TABS: 1.0000 | ORAL_TABLET | ORAL | 0 refills | Status: DC | PRN

## 2019-10-18 NOTE — Progress Notes (Signed)
Established patient visit  I,April Miller,acting as a scribe for Wilhemena Durie, MD.,have documented all relevant documentation on the behalf of Wilhemena Durie, MD,as directed by  Wilhemena Durie, MD while in the presence of Wilhemena Durie, MD.   Patient: Jesse Zimmerman   DOB: 11-06-1960   59 y.o. Male  MRN: [id number] Visit Date: 10/18/2019  Today's healthcare provider: Wilhemena Durie, MD   Chief Complaint  Patient presents with  . Rib Injury   Subjective    HPI   Patient had a fall 8 days ago in the hot tub. Patient hit his right side on the edge of the hot tub when he fell. Patient had an chest x-ray done at [facility] yesterday. Patient has a large bruise on right side.  It is in the right anterior lateral rib and the right upper quadrant of the abdomen and flank.  He had no syncope or presyncope.  It was a mechanical fall.  He had chest x-ray which was negative.  This was done at [facility] yesterday. Cirrhosis is followed by [facility] and he has seen his transplant specialist now.  He is having chronic nausea that they think is from dysfunctional gallbladder.  They are considering cholecystectomy. He does have a chronic dry cough in recent months.  No PND orthopnea.  Might of gotten worse with the spring and pollen.  He has no shortness of breath and no Covid symptoms.     Medications: Outpatient Medications Prior to Visit  Medication Sig  . Empagliflozin-metFORMIN HCl ER (SYNJARDY XR) 03-999 MG TB24 Take 1 tablet by mouth daily.  . furosemide (LASIX) 40 MG tablet Take 1 tablet (40 mg total) by mouth 2 (two) times daily.  Marland Kitchen HYDROcodone-acetaminophen (NORCO) 5-325 MG tablet Take 1 tablet by mouth every 4 (four) hours as needed for moderate pain.  Marland Kitchen loratadine (CLARITIN) 10 MG tablet TAKE 1 TABLET BY MOUTH EVERY DAY  . promethazine (PHENERGAN) 25 MG tablet Take 1 tablet (25 mg total) by mouth every 8 (eight) hours as needed for nausea or vomiting.  . propranolol  (INDERAL) 20 MG tablet Take 20 mg by mouth 2 (two) times daily.   Marland Kitchen spironolactone (ALDACTONE) 100 MG tablet Take 1 tablet (100 mg total) by mouth 2 (two) times daily. Take 50 mg BID for 1 week and then increase to 100 mg BID  . tamsulosin (FLOMAX) 0.4 MG CAPS capsule Take 1 capsule (0.4 mg total) by mouth daily. (Patient taking differently: Take 0.4 mg by mouth as needed. )  . Canagliflozin-metFORMIN HCl ER (INVOKAMET XR) 50-1000 MG TB24 Take 1 tablet by mouth daily. (Patient not taking: Reported on 10/18/2019)  . hydrocortisone (ANUSOL-HC) 25 MG suppository Place 1 suppository (25 mg total) rectally at bedtime as needed for hemorrhoids or anal itching. (Patient not taking: Reported on 08/15/2019)  . lactulose (CONSTULOSE) 10 GM/15ML solution Take 15 mLs (10 g total) by mouth 3 (three) times daily. (Patient not taking: Reported on 08/15/2019)  . meclizine (ANTIVERT) 25 MG tablet Take 1 tablet (25 mg total) by mouth 3 (three) times daily as needed for dizziness. (Patient not taking: Reported on 08/15/2019)  . metolazone (ZAROXOLYN) 2.5 MG tablet Take 1 tablet (2.5 mg total) by mouth daily. (Patient not taking: Reported on 08/15/2019)  . ondansetron (ZOFRAN ODT) 4 MG disintegrating tablet Take 1 tablet (4 mg total) by mouth every 8 (eight) hours as needed for nausea or vomiting. (Patient not taking: Reported on 08/15/2019)  .  ondansetron (ZOFRAN) 4 MG tablet Take 1 tablet (4 mg total) by mouth every 8 (eight) hours as needed for nausea or vomiting. (Patient not taking: Reported on 08/15/2019)  . traMADol (ULTRAM) 50 MG tablet Take 1 to 2 tablets every 8 hours and as needed for pain (Patient not taking: Reported on 08/15/2019)  . Vitamin D, Ergocalciferol, (DRISDOL) 1.25 MG (50000 UT) CAPS capsule Take 1 capsule (50,000 Units total) by mouth every 7 (seven) days. (Patient not taking: Reported on 10/18/2019)   No facility-administered medications prior to visit.    Review of Systems  Constitutional: Negative  for appetite change, chills and fever.  Eyes: Negative.   Respiratory: Positive for cough. Negative for chest tightness, shortness of breath and wheezing.   Cardiovascular: Negative for chest pain and palpitations.  Gastrointestinal: Negative for abdominal pain, nausea and vomiting.  Endocrine: Negative.   Musculoskeletal:       See HPI  Allergic/Immunologic: Negative.   Neurological: Negative.   Hematological: Bruises/bleeds easily.  Psychiatric/Behavioral: Negative.     Last hemoglobin A1c Lab Results  Component Value Date   HGBA1C 8.4 (H) 08/15/2019      Objective    BP 108/67 (BP Location: Right Arm, Patient Position: Sitting, Cuff Size: Large)   Pulse 65   Temp (!) 97.3 F (36.3 C) (Other (Comment))   Resp 18   Ht 6' (1.829 m)   Wt (!) 313 lb (142 kg)   SpO2 99%   BMI 42.45 kg/m  BP Readings from Last 3 Encounters:  10/18/19 108/67  08/15/19 121/86  03/28/19 112/69   Wt Readings from Last 3 Encounters:  10/18/19 (!) 313 lb (142 kg)  08/15/19 (!) 312 lb 6.4 oz (141.7 kg)  03/28/19 (!) 327 lb (148.3 kg)      Physical Exam Vitals and nursing note reviewed.  Constitutional:      Appearance: Normal appearance. He is normal weight.  Eyes:     Conjunctiva/sclera: Conjunctivae normal.  Cardiovascular:     Rate and Rhythm: Normal rate and regular rhythm.     Pulses: Normal pulses.     Heart sounds: Normal heart sounds.  Pulmonary:     Effort: Pulmonary effort is normal.     Breath sounds: Normal breath sounds.  Abdominal:     Palpations: Abdomen is soft.  Musculoskeletal:        General: Normal range of motion.     Cervical back: Normal range of motion and neck supple.     Right lower leg: Edema present.     Left lower leg: Edema present.     Comments: 1+  Neurological:     Mental Status: He is alert.  Psychiatric:        Mood and Affect: Mood normal.        Behavior: Behavior normal.        Thought Content: Thought content normal.        Judgment:  Judgment normal.       No results found for any visits on 10/18/19.  Assessment & Plan     1. Rib pain on right side Due to trauma.  There is no crepitance but he may have a hairline rib fracture.  Have given him 25 Norco 5/325 to use for pain. - DG Ribs Unilateral Right  2. Nausea Very possibly due to gallbladder disease.  Work-up for this is underway at [facility]. - promethazine (PHENERGAN) 25 MG tablet; Take 1 tablet (25 mg total) by mouth every 8 (eight)  hours as needed for nausea or vomiting.  Dispense: 60 tablet; Refill: 1  3. Fall, initial encounter Mechanical fall. - DG Ribs Unilateral Right  4. Acute pain of right knee Probably arthritic pain.  5. Cough Could be multiple things in this patient.  Mild allergies and asthma the most likely cause.  At this time try albuterol inhaler and I will see him back in few weeks.    No follow-ups on file.      I, Wilhemena Durie, MD, have reviewed all documentation for this visit. The documentation on 10/21/19 for the exam, diagnosis, procedures, and orders are all accurate and complete.    Teonna Coonan Cranford Mon, MD  [facility] [phone] (phone) [phone] (fax)  [place]

## 2019-10-19 ENCOUNTER — Ambulatory Visit
Admission: RE | Admit: 2019-10-19 | Discharge: 2019-10-19 | Disposition: A | Discharge status: Home / Self Care | Payer: BC Managed Care – PPO | Attending: Family Medicine | Admitting: Family Medicine

## 2019-10-19 ENCOUNTER — Ambulatory Visit
Admission: RE | Admit: 2019-10-19 | Discharge: 2019-10-19 | Disposition: A | Discharge status: Home / Self Care | Payer: BC Managed Care – PPO | Source: Ambulatory Visit | Attending: Family Medicine | Admitting: Family Medicine

## 2019-10-19 ENCOUNTER — Other Ambulatory Visit: Payer: Self-pay

## 2019-10-19 DIAGNOSIS — R0781 Pleurodynia: Secondary | ICD-10-CM | POA: Insufficient documentation

## 2019-10-19 DIAGNOSIS — W19XXXA Unspecified fall, initial encounter: Secondary | ICD-10-CM | POA: Diagnosis not present

## 2019-10-19 DIAGNOSIS — S299XXA Unspecified injury of thorax, initial encounter: Secondary | ICD-10-CM | POA: Diagnosis not present

## 2019-10-22 ENCOUNTER — Ambulatory Visit: Payer: Self-pay | Admitting: Family Medicine

## 2019-10-22 ENCOUNTER — Telehealth: Payer: Self-pay

## 2019-10-22 NOTE — Telephone Encounter (Signed)
-----   Message from Jerrol Banana., MD sent at 10/22/2019  1:23 PM EDT ----- Nondisplaced rib fracture and lower rib.  It will heal on his own but the pain may last another couple of weeks.  Remind patient several times a day to take deep breaths

## 2019-10-22 NOTE — Telephone Encounter (Signed)
Patient notified of results and PCP recommendations.

## 2019-10-22 NOTE — Telephone Encounter (Signed)
Left message for patient to call back okay for [facility] triage to advise patient. KW

## 2019-10-27 DIAGNOSIS — B9689 Other specified bacterial agents as the cause of diseases classified elsewhere: Secondary | ICD-10-CM | POA: Diagnosis not present

## 2019-10-27 DIAGNOSIS — J019 Acute sinusitis, unspecified: Secondary | ICD-10-CM | POA: Diagnosis not present

## 2019-10-27 DIAGNOSIS — H66003 Acute suppurative otitis media without spontaneous rupture of ear drum, bilateral: Secondary | ICD-10-CM | POA: Diagnosis not present

## 2019-10-27 DIAGNOSIS — J302 Other seasonal allergic rhinitis: Secondary | ICD-10-CM | POA: Diagnosis not present

## 2019-11-02 NOTE — Progress Notes (Signed)
Established patient visit  I,April Miller,acting as a scribe for Wilhemena Durie, MD.,have documented all relevant documentation on the behalf of Wilhemena Durie, MD,as directed by  Wilhemena Durie, MD while in the presence of Wilhemena Durie, MD.   Patient: Jesse Zimmerman   DOB: 03-Apr-1961   59 y.o. Male  MRN: [id number] Visit Date: 11/08/2019  Today's healthcare provider: Wilhemena Durie, MD   Chief Complaint  Patient presents with  . Follow-up  . Chest Pain  . Nausea  . Cough   Subjective    HPI  Rib pain from his fall is essentially resolved.  Overall he is feeling fairly well recently.  He plans to retire in the fall. He has appointment next week with [facility] about possibly removing his gallbladder for his chronic ongoing nausea. His cough has improved. Rib pain on right side From 10/18/2019-Due to trauma. There is no crepitance but he may have a hairline rib fracture.  Have given him 25 Norco 5/325 to use for pain. X-ray obtained showing-Nondisplaced rib fracture and lower rib. advised it will heal on his own but the pain may last another couple of weeks. Reminded patient several times a day to take deep breaths.  Patient states his rib pain has improved.   Nausea From 10/18/2019-Very possibly due to gallbladder disease.  Work-up for this is underway at [facility]. Given rx for promethazine (PHENERGAN) 25 mg.  Cough From 10/18/2019-Could be multiple things in this patient. Mild allergies and asthma the most likely cause. At this time try albuterol inhaler and I will see him back in few weeks.  Patient also would like for provider to check his right ear. Patient states he has been unable to hear out of his right ear for about 11 days. Patient did see someone at the [facility] clinic, who said patient has an ear infection. Patient was given an antibiotic, which he has completed. Patient states his ear is still not right.       Medications: Outpatient  Medications Prior to Visit  Medication Sig  . albuterol (VENTOLIN HFA) 108 (90 Base) MCG/ACT inhaler Inhale 2 puffs into the lungs every 6 (six) hours as needed for wheezing or shortness of breath.  . Empagliflozin-metFORMIN HCl ER (SYNJARDY XR) 03-999 MG TB24 Take 1 tablet by mouth daily.  . furosemide (LASIX) 40 MG tablet Take 1 tablet (40 mg total) by mouth 2 (two) times daily.  Marland Kitchen loratadine (CLARITIN) 10 MG tablet TAKE 1 TABLET BY MOUTH EVERY DAY  . promethazine (PHENERGAN) 25 MG tablet Take 1 tablet (25 mg total) by mouth every 8 (eight) hours as needed for nausea or vomiting.  . propranolol (INDERAL) 20 MG tablet Take 20 mg by mouth 2 (two) times daily.   Marland Kitchen spironolactone (ALDACTONE) 100 MG tablet Take 1 tablet (100 mg total) by mouth 2 (two) times daily. Take 50 mg BID for 1 week and then increase to 100 mg BID  . tamsulosin (FLOMAX) 0.4 MG CAPS capsule Take 1 capsule (0.4 mg total) by mouth daily.  . Canagliflozin-metFORMIN HCl ER (INVOKAMET XR) 50-1000 MG TB24 Take 1 tablet by mouth daily. (Patient not taking: Reported on 10/18/2019)  . HYDROcodone-acetaminophen (NORCO) 5-325 MG tablet Take 1 tablet by mouth every 4 (four) hours as needed for moderate pain. (Patient not taking: Reported on 11/08/2019)  . hydrocortisone (ANUSOL-HC) 25 MG suppository Place 1 suppository (25 mg total) rectally at bedtime as needed for hemorrhoids or anal itching. (Patient  not taking: Reported on 08/15/2019)  . lactulose (CONSTULOSE) 10 GM/15ML solution Take 15 mLs (10 g total) by mouth 3 (three) times daily. (Patient not taking: Reported on 08/15/2019)  . meclizine (ANTIVERT) 25 MG tablet Take 1 tablet (25 mg total) by mouth 3 (three) times daily as needed for dizziness. (Patient not taking: Reported on 08/15/2019)  . metolazone (ZAROXOLYN) 2.5 MG tablet Take 1 tablet (2.5 mg total) by mouth daily. (Patient not taking: Reported on 08/15/2019)  . ondansetron (ZOFRAN ODT) 4 MG disintegrating tablet Take 1 tablet (4 mg  total) by mouth every 8 (eight) hours as needed for nausea or vomiting. (Patient not taking: Reported on 11/08/2019)  . ondansetron (ZOFRAN) 4 MG tablet Take 1 tablet (4 mg total) by mouth every 8 (eight) hours as needed for nausea or vomiting. (Patient not taking: Reported on 11/08/2019)  . traMADol (ULTRAM) 50 MG tablet Take 1 to 2 tablets every 8 hours and as needed for pain (Patient not taking: Reported on 08/15/2019)  . Vitamin D, Ergocalciferol, (DRISDOL) 1.25 MG (50000 UT) CAPS capsule Take 1 capsule (50,000 Units total) by mouth every 7 (seven) days. (Patient not taking: Reported on 10/18/2019)   No facility-administered medications prior to visit.    Review of Systems  Constitutional: Positive for fatigue. Negative for appetite change, chills and fever.  Eyes: Negative.   Respiratory: Negative for chest tightness, shortness of breath and wheezing.   Cardiovascular: Positive for leg swelling. Negative for chest pain and palpitations.  Gastrointestinal: Positive for nausea. Negative for abdominal pain and vomiting.  Endocrine: Negative.   Musculoskeletal: Positive for myalgias.  Skin: Negative.   Allergic/Immunologic: Negative.   Neurological: Negative.   Hematological: Negative.   Psychiatric/Behavioral: Negative.        Objective    BP 126/61 (BP Location: Right Arm, Patient Position: Sitting, Cuff Size: Large)   Pulse (!) 57   Temp (!) 96.9 F (36.1 C) (Other (Comment))   Resp 18   Ht 6' (1.829 m)   Wt (!) 301 lb (136.5 kg)   SpO2 98%   BMI 40.82 kg/m  Wt Readings from Last 3 Encounters:  11/08/19 (!) 301 lb (136.5 kg)  10/18/19 (!) 313 lb (142 kg)  08/15/19 (!) 312 lb 6.4 oz (141.7 kg)      Depression screen [facility] 2/9 11/08/2019 10/19/2018 06/30/2018  Decreased Interest 0 1 0  Down, Depressed, Hopeless 0 0 0  PHQ - 2 Score 0 1 0  Altered sleeping 2 0 -  Tired, decreased energy 3 3 -  Change in appetite 1 0 -  Feeling bad or failure about yourself  0 0 -  Trouble  concentrating 0 0 -  Moving slowly or fidgety/restless 0 0 -  Suicidal thoughts 0 0 -  PHQ-9 Score 6 4 -  Difficult doing work/chores Somewhat difficult Not difficult at all -     Physical Exam Vitals reviewed.  Constitutional:      Appearance: Normal appearance. He is obese.  HENT:     Head: Normocephalic and atraumatic.     Right Ear: External ear normal.     Left Ear: External ear normal.     Nose: Nose normal.     Mouth/Throat:     Pharynx: Oropharynx is clear.  Eyes:     General: No scleral icterus.    Conjunctiva/sclera: Conjunctivae normal.  Neck:     Comments: No JVD noted. Cardiovascular:     Rate and Rhythm: Normal rate and regular rhythm.  Pulses: Normal pulses.     Heart sounds: Normal heart sounds.  Pulmonary:     Effort: Pulmonary effort is normal.  Abdominal:     Palpations: Abdomen is soft.     Tenderness: There is no abdominal tenderness.  Musculoskeletal:     Comments: 1+  edema.  Skin:    General: Skin is warm and dry.  Neurological:     General: No focal deficit present.     Mental Status: He is alert and oriented to person, place, and time. Mental status is at baseline.  Psychiatric:        Mood and Affect: Mood normal.        Behavior: Behavior normal.        Thought Content: Thought content normal.        Judgment: Judgment normal.       No results found for any visits on 11/08/19.  Assessment & Plan     1. 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 2. Diabetes mellitus type 2 in obese (HCC)   3. Cholecystitis, chronic Per surgery.  4. Mixed hyperlipidemia   5. Class 3 severe obesity due to excess calories with serious comorbidity and body mass index (BMI) of 40.0 to 44.9 in adult [facility])    No follow-ups on file.      I, Wilhemena Durie, MD, have reviewed all documentation for this visit. The documentation on 11/11/19 for the exam, diagnosis, procedures, and  orders are all accurate and complete.    Jenniefer Salak Cranford Mon, MD  [facility] [phone] (phone) [phone] (fax)  [place]

## 2019-11-08 ENCOUNTER — Other Ambulatory Visit: Payer: Self-pay

## 2019-11-08 ENCOUNTER — Encounter: Payer: Self-pay | Admitting: Family Medicine

## 2019-11-08 ENCOUNTER — Ambulatory Visit (INDEPENDENT_AMBULATORY_CARE_PROVIDER_SITE_OTHER): Payer: BC Managed Care – PPO | Admitting: Family Medicine

## 2019-11-08 VITALS — BP 126/61 | HR 57 | Temp 96.9°F | Resp 18 | Ht 72.0 in | Wt 301.0 lb

## 2019-11-08 DIAGNOSIS — Z6841 Body Mass Index (BMI) 40.0 and over, adult: Secondary | ICD-10-CM

## 2019-11-08 DIAGNOSIS — E1169 Type 2 diabetes mellitus with other specified complication: Secondary | ICD-10-CM | POA: Diagnosis not present

## 2019-11-08 DIAGNOSIS — E782 Mixed hyperlipidemia: Secondary | ICD-10-CM | POA: Diagnosis not present

## 2019-11-08 DIAGNOSIS — K7469 Other cirrhosis of liver: Secondary | ICD-10-CM

## 2019-11-08 DIAGNOSIS — K811 Chronic cholecystitis: Secondary | ICD-10-CM | POA: Diagnosis not present

## 2019-11-08 DIAGNOSIS — E669 Obesity, unspecified: Secondary | ICD-10-CM

## 2019-11-09 DIAGNOSIS — K766 Portal hypertension: Secondary | ICD-10-CM | POA: Diagnosis not present

## 2019-11-09 DIAGNOSIS — K7581 Nonalcoholic steatohepatitis (NASH): Secondary | ICD-10-CM | POA: Diagnosis not present

## 2019-11-09 DIAGNOSIS — K7460 Unspecified cirrhosis of liver: Secondary | ICD-10-CM | POA: Diagnosis not present

## 2019-11-14 ENCOUNTER — Ambulatory Visit: Payer: Self-pay | Admitting: Family Medicine

## 2019-11-15 ENCOUNTER — Telehealth: Payer: Self-pay

## 2019-11-15 DIAGNOSIS — H903 Sensorineural hearing loss, bilateral: Secondary | ICD-10-CM | POA: Diagnosis not present

## 2019-11-15 NOTE — Telephone Encounter (Signed)
Please advise 

## 2019-11-15 NOTE — Telephone Encounter (Signed)
Copied from [place] [id number] [id number]. Topic: General - Other >> Nov 15, 2019  4:21 PM Oneta Rack wrote: Osvaldo Human name: Kathlee Nations from [facility]  Call back number: [phone] Ext [phone]    Reason for call:  Would like to prescribe prednisone 10 mg dose pack due to patient experiencing asymmetrical hearing loss, patient was seen today at specialist office and would like a follow up call

## 2019-11-16 ENCOUNTER — Other Ambulatory Visit: Payer: Self-pay | Admitting: Otolaryngology

## 2019-11-16 ENCOUNTER — Other Ambulatory Visit (HOSPITAL_COMMUNITY): Payer: Self-pay | Admitting: Otolaryngology

## 2019-11-16 DIAGNOSIS — IMO0001 Reserved for inherently not codable concepts without codable children: Secondary | ICD-10-CM

## 2019-11-16 NOTE — Telephone Encounter (Signed)
Kathlee Nations calling again and is requesting to have an update before the weekend. Please advise.

## 2019-11-16 NOTE — Telephone Encounter (Signed)
[facility] wanted update of patient's liver cirrhosis from NASH. Labs from February 2021 only showed increase in Alk. Phosphatase without other liver enzyme elevations. Has been stable for long time now. It should be OK for them to use a short 6 day taper of prednisone to try to help hearing loss.

## 2019-11-17 NOTE — Telephone Encounter (Signed)
yes

## 2019-11-20 NOTE — Telephone Encounter (Signed)
Returned call to Kathlee Nations at [facility]. Left detailed message on vm giving okay to prescribed prednisone.

## 2019-11-29 ENCOUNTER — Telehealth: Payer: Self-pay | Admitting: Family Medicine

## 2019-11-29 DIAGNOSIS — E559 Vitamin D deficiency, unspecified: Secondary | ICD-10-CM

## 2019-11-29 MED ORDER — VITAMIN D (ERGOCALCIFEROL) 1.25 MG (50000 UNIT) PO CAPS: 50000.0000 [IU] | ORAL_CAPSULE | ORAL | 3 refills | Status: DC

## 2019-11-29 NOTE — Telephone Encounter (Signed)
Medication Refill - Medication:  Vitamin D, Ergocalciferol, (DRISDOL) 1.25 MG (50000 UT) CAPS capsule   Has the patient contacted their pharmacy?  Yes advised to call office. Employer no longer using [facility].   Preferred Pharmacy (with phone number or street name):  [address], [place], [place] [address] Phone: [phone]  Agent: Please be advised that RX refills may take up to 3 business days. We ask that you follow-up with your pharmacy.

## 2019-11-29 NOTE — Telephone Encounter (Signed)
Medication refill sent to pharmacy  

## 2019-12-03 ENCOUNTER — Ambulatory Visit
Admission: RE | Admit: 2019-12-03 | Discharge: 2019-12-03 | Disposition: A | Discharge status: Home / Self Care | Payer: BC Managed Care – PPO | Source: Ambulatory Visit | Attending: Otolaryngology | Admitting: Otolaryngology

## 2019-12-03 ENCOUNTER — Other Ambulatory Visit: Payer: Self-pay

## 2019-12-03 DIAGNOSIS — IMO0001 Reserved for inherently not codable concepts without codable children: Secondary | ICD-10-CM

## 2019-12-03 DIAGNOSIS — H9042 Sensorineural hearing loss, unilateral, left ear, with unrestricted hearing on the contralateral side: Secondary | ICD-10-CM | POA: Diagnosis not present

## 2019-12-03 DIAGNOSIS — H9193 Unspecified hearing loss, bilateral: Secondary | ICD-10-CM | POA: Diagnosis not present

## 2019-12-03 MED ORDER — GADOBUTROL 1 MMOL/ML IV SOLN
10.0000 mL | Freq: Once | INTRAVENOUS | Status: AC | PRN
  Administered 2019-12-03: 10 mL via INTRAVENOUS

## 2019-12-07 DIAGNOSIS — R42 Dizziness and giddiness: Secondary | ICD-10-CM | POA: Diagnosis not present

## 2019-12-07 DIAGNOSIS — H903 Sensorineural hearing loss, bilateral: Secondary | ICD-10-CM | POA: Diagnosis not present

## 2019-12-07 DIAGNOSIS — J301 Allergic rhinitis due to pollen: Secondary | ICD-10-CM | POA: Diagnosis not present

## 2019-12-17 DIAGNOSIS — K8020 Calculus of gallbladder without cholecystitis without obstruction: Secondary | ICD-10-CM | POA: Diagnosis not present

## 2019-12-17 DIAGNOSIS — K7460 Unspecified cirrhosis of liver: Secondary | ICD-10-CM | POA: Diagnosis not present

## 2019-12-17 DIAGNOSIS — R188 Other ascites: Secondary | ICD-10-CM | POA: Diagnosis not present

## 2019-12-17 DIAGNOSIS — K7581 Nonalcoholic steatohepatitis (NASH): Secondary | ICD-10-CM | POA: Diagnosis not present

## 2019-12-21 ENCOUNTER — Other Ambulatory Visit: Payer: Self-pay | Admitting: Family Medicine

## 2019-12-21 DIAGNOSIS — R110 Nausea: Secondary | ICD-10-CM

## 2019-12-21 DIAGNOSIS — E559 Vitamin D deficiency, unspecified: Secondary | ICD-10-CM

## 2019-12-21 NOTE — Telephone Encounter (Signed)
[facility] faxed refill request for the following medications:  promethazine (PHENERGAN) 25 MG tablet  Vitamin D, Ergocalciferol, (DRISDOL) 1.25 MG (50000 UNIT) CAPS capsule   Please advise.

## 2019-12-25 ENCOUNTER — Telehealth: Payer: Self-pay | Admitting: Family Medicine

## 2019-12-25 MED ORDER — VITAMIN D (ERGOCALCIFEROL) 1.25 MG (50000 UNIT) PO CAPS: 50000.0000 [IU] | ORAL_CAPSULE | ORAL | 1 refills | Status: DC

## 2019-12-25 MED ORDER — PROMETHAZINE HCL 25 MG PO TABS: 25.0000 mg | ORAL_TABLET | Freq: Three times a day (TID) | ORAL | 1 refills | Status: DC | PRN

## 2019-12-25 NOTE — Telephone Encounter (Signed)
[facility] is requesting refills on Vita D and Promethazine

## 2019-12-27 DIAGNOSIS — H903 Sensorineural hearing loss, bilateral: Secondary | ICD-10-CM | POA: Diagnosis not present

## 2020-01-02 NOTE — Progress Notes (Signed)
Established patient visit  I,April Miller,acting as a scribe for Wilhemena Durie, MD.,have documented all relevant documentation on the behalf of Wilhemena Durie, MD,as directed by  Wilhemena Durie, MD while in the presence of Wilhemena Durie, MD.    Patient: Jesse Zimmerman   DOB: 1961/01/27   59 y.o. Male  MRN: [id number] Visit Date: 01/03/2020  Today's healthcare provider: Wilhemena Durie, MD   Chief Complaint  Patient presents with  . Diabetes  . Follow-up  . Hypertension   Subjective    HPI  Cirrhosis is worsened in the past year and [facility] is working on getting him on the transplant list.  Patient plans to retire in September but may work until next March if the company makes it for this while financially.  He states he "feels lousy" lately and has had up a lot of problems with dizziness and due to Mnire's.  He has actually fallen some.  He also complains of recent problems with constipation. Diabetes Mellitus Type II, follow-up  Lab Results  Component Value Date   HGBA1C 9.7 (A) 01/03/2020   HGBA1C 8.4 (H) 08/15/2019   HGBA1C 7.4 (H) 03/07/2019   Last seen for diabetes 5 months ago.  Management since then includes continuing the same treatment. He reports good compliance with treatment. He is not having side effects. none  Home blood sugar records: fasting range: not checking  Episodes of hypoglycemia? No none   Current insulin regiment: n/a Most Recent Eye Exam: due  --------------------------------------------------------------------  Hypertension, follow-up  BP Readings from Last 3 Encounters:  01/03/20 106/73  11/08/19 126/61  10/18/19 108/67   Wt Readings from Last 3 Encounters:  01/03/20 (!) 301 lb (136.5 kg)  11/08/19 (!) 301 lb (136.5 kg)  10/18/19 (!) 313 lb (142 kg)     He was last seen for hypertension 2 months ago.  BP at that visit was 126/61. Management since that visit includes; no changes. He reports good compliance  with treatment. He is not having side effects. none He is exercising. He is adherent to low salt diet.   Outside blood pressures are not checking.  He does not smoke.  Use of agents associated with hypertension: none.   --------------------------------------------------------------------       Medications: Outpatient Medications Prior to Visit  Medication Sig  . albuterol (VENTOLIN HFA) 108 (90 Base) MCG/ACT inhaler Inhale 2 puffs into the lungs every 6 (six) hours as needed for wheezing or shortness of breath.  . Empagliflozin-metFORMIN HCl ER (SYNJARDY XR) 03-999 MG TB24 Take 1 tablet by mouth daily.  . furosemide (LASIX) 40 MG tablet Take 1 tablet (40 mg total) by mouth 2 (two) times daily.  Marland Kitchen HYDROcodone-acetaminophen (NORCO) 5-325 MG tablet Take 1 tablet by mouth every 4 (four) hours as needed for moderate pain.  Marland Kitchen loratadine (CLARITIN) 10 MG tablet TAKE 1 TABLET BY MOUTH EVERY DAY  . ondansetron (ZOFRAN ODT) 4 MG disintegrating tablet Take 1 tablet (4 mg total) by mouth every 8 (eight) hours as needed for nausea or vomiting.  . promethazine (PHENERGAN) 25 MG tablet Take 1 tablet (25 mg total) by mouth every 8 (eight) hours as needed for nausea or vomiting.  . propranolol (INDERAL) 20 MG tablet Take 20 mg by mouth 2 (two) times daily.   Marland Kitchen spironolactone (ALDACTONE) 100 MG tablet Take 1 tablet (100 mg total) by mouth 2 (two) times daily. Take 50 mg BID for 1 week and then increase to  100 mg BID  . tamsulosin (FLOMAX) 0.4 MG CAPS capsule Take 1 capsule (0.4 mg total) by mouth daily.  . traMADol (ULTRAM) 50 MG tablet Take 1 to 2 tablets every 8 hours and as needed for pain  . Canagliflozin-metFORMIN HCl ER (INVOKAMET XR) 50-1000 MG TB24 Take 1 tablet by mouth daily. (Patient not taking: Reported on 10/18/2019)  . hydrocortisone (ANUSOL-HC) 25 MG suppository Place 1 suppository (25 mg total) rectally at bedtime as needed for hemorrhoids or anal itching. (Patient not taking: Reported on  08/15/2019)  . lactulose (CONSTULOSE) 10 GM/15ML solution Take 15 mLs (10 g total) by mouth 3 (three) times daily. (Patient not taking: Reported on 08/15/2019)  . meclizine (ANTIVERT) 25 MG tablet Take 1 tablet (25 mg total) by mouth 3 (three) times daily as needed for dizziness. (Patient not taking: Reported on 08/15/2019)  . metolazone (ZAROXOLYN) 2.5 MG tablet Take 1 tablet (2.5 mg total) by mouth daily. (Patient not taking: Reported on 01/03/2020)  . ondansetron (ZOFRAN) 4 MG tablet Take 1 tablet (4 mg total) by mouth every 8 (eight) hours as needed for nausea or vomiting. (Patient not taking: Reported on 11/08/2019)  . Vitamin D, Ergocalciferol, (DRISDOL) 1.25 MG (50000 UNIT) CAPS capsule Take 1 capsule (50,000 Units total) by mouth every 7 (seven) days. (Patient not taking: Reported on 01/03/2020)   No facility-administered medications prior to visit.    Review of Systems  Constitutional: Negative for appetite change, chills and fever.  Eyes: Negative.   Respiratory: Negative for chest tightness, shortness of breath and wheezing.   Cardiovascular: Negative for chest pain and palpitations.  Gastrointestinal: Positive for constipation. Negative for abdominal pain, nausea and vomiting.  Endocrine: Negative.   Allergic/Immunologic: Negative.   Neurological: Positive for dizziness.  Hematological: Negative.   Psychiatric/Behavioral: Negative.     Last hemoglobin A1c Lab Results  Component Value Date   HGBA1C 9.7 (A) 01/03/2020      Objective    BP 106/73 (BP Location: Left Arm, Patient Position: Sitting, Cuff Size: Large)   Pulse 60   Temp 97.7 F (36.5 C) (Other (Comment))   Resp 16   Ht 6' (1.829 m)   Wt (!) 301 lb (136.5 kg)   SpO2 97%   BMI 40.82 kg/m  Wt Readings from Last 3 Encounters:  01/03/20 (!) 301 lb (136.5 kg)  11/08/19 (!) 301 lb (136.5 kg)  10/18/19 (!) 313 lb (142 kg)      Physical Exam Vitals reviewed.  Constitutional:      Appearance: Normal appearance.  He is obese.  HENT:     Head: Normocephalic and atraumatic.     Right Ear: External ear normal.     Left Ear: External ear normal.     Nose: Nose normal.     Mouth/Throat:     Pharynx: Oropharynx is clear.  Eyes:     General: No scleral icterus.    Conjunctiva/sclera: Conjunctivae normal.  Neck:     Comments: No JVD noted. Cardiovascular:     Rate and Rhythm: Normal rate and regular rhythm.     Pulses: Normal pulses.     Heart sounds: Normal heart sounds.  Pulmonary:     Effort: Pulmonary effort is normal.  Abdominal:     Palpations: Abdomen is soft.     Tenderness: There is no abdominal tenderness.  Musculoskeletal:     Comments: 1+  edema.  Skin:    General: Skin is warm and dry.  Neurological:  General: No focal deficit present.     Mental Status: He is alert and oriented to person, place, and time. Mental status is at baseline.  Psychiatric:        Mood and Affect: Mood normal.        Behavior: Behavior normal.        Thought Content: Thought content normal.        Judgment: Judgment normal.       Results for orders placed or performed in visit on 01/03/20  POCT glycosylated hemoglobin (Hb A1C)  Result Value Ref Range   Hemoglobin A1C 9.7 (A) 4.0 - 5.6 %   Est. average glucose Bld gHb Est-mCnc 232     Assessment & Plan     1. Diabetes mellitus type 2 in obese (HCC) A1c 9.7 today.  Patient to work on diet and hopefully some exercise.  Consider adding Ozempic. - POCT glycosylated hemoglobin (Hb A1C) - blood glucose meter kit and supplies; Dispense based on patient and insurance preference. Use once daily as directed. (FOR ICD-10 E11.69).  Dispense: 1 each; Refill: 0 - Comprehensive Metabolic Panel (CMET) - Lipid panel - CK (Creatine Kinase) - Vitamin B12  2. Essential hypertension  - Comprehensive Metabolic Panel (CMET) - Lipid panel - CK (Creatine Kinase) - Vitamin B12  3. Meniere's disease, unspecified laterality Patient has had an MRI set up for  this.  Followed by specialty - Comprehensive Metabolic Panel (CMET) - Lipid panel - CK (Creatine Kinase) - Vitamin B12  4. Cirrhosis of liver with ascites, unspecified hepatic cirrhosis type ([place]) Possibly getting on transplant list soon.  Increase lactulose to twice a day - Comprehensive Metabolic Panel (CMET) - Lipid panel - CK (Creatine Kinase) - Vitamin B12  5. Constipation, unspecified constipation type  - Comprehensive Metabolic Panel (CMET) - Lipid panel - CK (Creatine Kinase) - Vitamin B12  6. Class 3 severe obesity due to excess calories with serious comorbidity and body mass index (BMI) of 40.0 to 44.9 in adult [facility])  - Comprehensive Metabolic Panel (CMET) - Lipid panel - CK (Creatine Kinase) - Vitamin B12  7. Myalgia  - Comprehensive Metabolic Panel (CMET) - Lipid panel - CK (Creatine Kinase) - Vitamin B12  8. Avitaminosis D  - VITAMIN D 25 Hydroxy (Vit-D Deficiency, Fractures) - Comprehensive Metabolic Panel (CMET) - Lipid panel - CK (Creatine Kinase) - Vitamin B12  9. Encounter for screening for COVID-19  - SARS-CoV-2 Semi-Quantitative Total Antibody, Spike 10.  Constipation Increase lactulose to twice a day Return in about 3 months (around 04/04/2020).      I, Wilhemena Durie, MD, have reviewed all documentation for this visit. The documentation on 01/06/20 for the exam, diagnosis, procedures, and orders are all accurate and complete.    Richard Cranford Mon, MD  [facility] [phone] (phone) [phone] (fax)  [place]

## 2020-01-03 ENCOUNTER — Encounter: Payer: Self-pay | Admitting: Family Medicine

## 2020-01-03 ENCOUNTER — Other Ambulatory Visit: Payer: Self-pay

## 2020-01-03 ENCOUNTER — Ambulatory Visit: Payer: BC Managed Care – PPO | Admitting: Family Medicine

## 2020-01-03 VITALS — BP 106/73 | HR 60 | Temp 97.7°F | Resp 16 | Ht 72.0 in | Wt 301.0 lb

## 2020-01-03 DIAGNOSIS — H8109 Meniere's disease, unspecified ear: Secondary | ICD-10-CM

## 2020-01-03 DIAGNOSIS — E669 Obesity, unspecified: Secondary | ICD-10-CM | POA: Diagnosis not present

## 2020-01-03 DIAGNOSIS — E559 Vitamin D deficiency, unspecified: Secondary | ICD-10-CM

## 2020-01-03 DIAGNOSIS — E1169 Type 2 diabetes mellitus with other specified complication: Secondary | ICD-10-CM

## 2020-01-03 DIAGNOSIS — K7460 Unspecified cirrhosis of liver: Secondary | ICD-10-CM

## 2020-01-03 DIAGNOSIS — R188 Other ascites: Secondary | ICD-10-CM

## 2020-01-03 DIAGNOSIS — I10 Essential (primary) hypertension: Secondary | ICD-10-CM

## 2020-01-03 DIAGNOSIS — K5900 Constipation, unspecified: Secondary | ICD-10-CM

## 2020-01-03 DIAGNOSIS — M791 Myalgia, unspecified site: Secondary | ICD-10-CM

## 2020-01-03 DIAGNOSIS — Z1152 Encounter for screening for COVID-19: Secondary | ICD-10-CM

## 2020-01-03 DIAGNOSIS — Z6841 Body Mass Index (BMI) 40.0 and over, adult: Secondary | ICD-10-CM

## 2020-01-03 LAB — POCT GLYCOSYLATED HEMOGLOBIN (HGB A1C)
Est. average glucose Bld gHb Est-mCnc: 232
Hemoglobin A1C: 9.7 % — AB (ref 4.0–5.6)

## 2020-01-03 MED ORDER — BLOOD GLUCOSE METER KIT: PACK | 0 refills | Status: DC

## 2020-01-04 DIAGNOSIS — I10 Essential (primary) hypertension: Secondary | ICD-10-CM | POA: Diagnosis not present

## 2020-01-04 DIAGNOSIS — H903 Sensorineural hearing loss, bilateral: Secondary | ICD-10-CM | POA: Diagnosis not present

## 2020-01-04 DIAGNOSIS — E1169 Type 2 diabetes mellitus with other specified complication: Secondary | ICD-10-CM | POA: Diagnosis not present

## 2020-01-04 DIAGNOSIS — E669 Obesity, unspecified: Secondary | ICD-10-CM | POA: Diagnosis not present

## 2020-01-04 DIAGNOSIS — E559 Vitamin D deficiency, unspecified: Secondary | ICD-10-CM | POA: Diagnosis not present

## 2020-01-05 LAB — COMPREHENSIVE METABOLIC PANEL
ALT: 25 IU/L (ref 0–44)
AST: 35 IU/L (ref 0–40)
Albumin/Globulin Ratio: 0.8 — ABNORMAL LOW (ref 1.2–2.2)
Albumin: 2.7 g/dL — ABNORMAL LOW (ref 3.8–4.9)
Alkaline Phosphatase: 269 IU/L — ABNORMAL HIGH (ref 48–121)
BUN/Creatinine Ratio: 11 (ref 9–20)
BUN: 16 mg/dL (ref 6–24)
Bilirubin Total: 1.5 mg/dL — ABNORMAL HIGH (ref 0.0–1.2)
CO2: 21 mmol/L (ref 20–29)
Calcium: 8.4 mg/dL — ABNORMAL LOW (ref 8.7–10.2)
Chloride: 100 mmol/L (ref 96–106)
Creatinine, Ser: 1.46 mg/dL — ABNORMAL HIGH (ref 0.76–1.27)
GFR calc Af Amer: 60 mL/min/{1.73_m2} (ref >59)
GFR calc non Af Amer: 52 mL/min/{1.73_m2} — ABNORMAL LOW (ref >59)
Globulin, Total: 3.5 g/dL (ref 1.5–4.5)
Glucose: 385 mg/dL — ABNORMAL HIGH (ref 65–99)
Potassium: 4.3 mmol/L (ref 3.5–5.2)
Sodium: 135 mmol/L (ref 134–144)
Total Protein: 6.2 g/dL (ref 6.0–8.5)

## 2020-01-05 LAB — VITAMIN D 25 HYDROXY (VIT D DEFICIENCY, FRACTURES): Vit D, 25-Hydroxy: 18.6 ng/mL — ABNORMAL LOW (ref 30.0–100.0)

## 2020-01-05 LAB — VITAMIN B12: Vitamin B-12: 677 pg/mL (ref 232–1245)

## 2020-01-05 LAB — CK: Total CK: 214 U/L (ref 41–331)

## 2020-01-05 LAB — LIPID PANEL
Chol/HDL Ratio: 2.8 ratio (ref 0.0–5.0)
Cholesterol, Total: 105 mg/dL (ref 100–199)
HDL: 38 mg/dL — ABNORMAL LOW (ref >39)
LDL Chol Calc (NIH): 48 mg/dL (ref 0–99)
Triglycerides: 97 mg/dL (ref 0–149)
VLDL Cholesterol Cal: 19 mg/dL (ref 5–40)

## 2020-01-05 LAB — SARS-COV-2 SEMI-QUANTITATIVE TOTAL ANTIBODY, SPIKE
SARS-CoV-2 Semi-Quant Total Ab: 684.5 U/mL (ref <0.8)
SARS-CoV-2 Spike Ab Interp: POSITIVE

## 2020-01-14 ENCOUNTER — Telehealth: Payer: Self-pay

## 2020-01-14 NOTE — Telephone Encounter (Signed)
Copied from [place] [id number]. Topic: General - Call Back - No Documentation >> Jan 14, 2020  9:39 AM Jesse Zimmerman D wrote: Reason for CRM: Patient wants to make sure paperwork has been sent in to verify that he has completed his annual physical for insurance. Please call patient back with resolution. Patient advise to leave voicemail on home phone.

## 2020-01-14 NOTE — Telephone Encounter (Signed)
LMOVM advising pt form was faxed last week.

## 2020-01-16 ENCOUNTER — Telehealth: Payer: Self-pay

## 2020-01-16 NOTE — Telephone Encounter (Signed)
Copied from [place] [phone]. Topic: General - Other >> Jan 16, 2020  2:06 PM Rainey Pines A wrote: Patient stated that [facility] is requesting a copy of patients most recent lab work on 7/16 to be faxed to [facility] at [phone].

## 2020-01-21 DIAGNOSIS — K7469 Other cirrhosis of liver: Secondary | ICD-10-CM | POA: Diagnosis not present

## 2020-01-21 DIAGNOSIS — Z01818 Encounter for other preprocedural examination: Secondary | ICD-10-CM | POA: Diagnosis not present

## 2020-01-22 ENCOUNTER — Other Ambulatory Visit: Payer: Self-pay

## 2020-01-22 ENCOUNTER — Inpatient Hospital Stay
Admission: EM | Admit: 2020-01-22 | Discharge: 2020-01-25 | DRG: 872 | Disposition: A | Discharge status: Home / Self Care | Payer: BC Managed Care – PPO | Attending: Internal Medicine | Admitting: Internal Medicine

## 2020-01-22 ENCOUNTER — Emergency Department: Payer: BC Managed Care – PPO

## 2020-01-22 DIAGNOSIS — R6520 Severe sepsis without septic shock: Secondary | ICD-10-CM | POA: Diagnosis present

## 2020-01-22 DIAGNOSIS — N390 Urinary tract infection, site not specified: Secondary | ICD-10-CM | POA: Diagnosis not present

## 2020-01-22 DIAGNOSIS — Z91040 Latex allergy status: Secondary | ICD-10-CM | POA: Diagnosis not present

## 2020-01-22 DIAGNOSIS — N179 Acute kidney failure, unspecified: Secondary | ICD-10-CM | POA: Diagnosis not present

## 2020-01-22 DIAGNOSIS — Z8249 Family history of ischemic heart disease and other diseases of the circulatory system: Secondary | ICD-10-CM

## 2020-01-22 DIAGNOSIS — E785 Hyperlipidemia, unspecified: Secondary | ICD-10-CM | POA: Diagnosis present

## 2020-01-22 DIAGNOSIS — D696 Thrombocytopenia, unspecified: Secondary | ICD-10-CM | POA: Diagnosis present

## 2020-01-22 DIAGNOSIS — K7460 Unspecified cirrhosis of liver: Secondary | ICD-10-CM | POA: Diagnosis present

## 2020-01-22 DIAGNOSIS — Z91018 Allergy to other foods: Secondary | ICD-10-CM

## 2020-01-22 DIAGNOSIS — Z20822 Contact with and (suspected) exposure to covid-19: Secondary | ICD-10-CM | POA: Diagnosis present

## 2020-01-22 DIAGNOSIS — R0682 Tachypnea, not elsewhere classified: Secondary | ICD-10-CM | POA: Diagnosis not present

## 2020-01-22 DIAGNOSIS — I8500 Esophageal varices without bleeding: Secondary | ICD-10-CM | POA: Diagnosis not present

## 2020-01-22 DIAGNOSIS — Z79891 Long term (current) use of opiate analgesic: Secondary | ICD-10-CM

## 2020-01-22 DIAGNOSIS — Z6841 Body Mass Index (BMI) 40.0 and over, adult: Secondary | ICD-10-CM

## 2020-01-22 DIAGNOSIS — A419 Sepsis, unspecified organism: Principal | ICD-10-CM | POA: Diagnosis present

## 2020-01-22 DIAGNOSIS — G8929 Other chronic pain: Secondary | ICD-10-CM | POA: Diagnosis present

## 2020-01-22 DIAGNOSIS — E782 Mixed hyperlipidemia: Secondary | ICD-10-CM

## 2020-01-22 DIAGNOSIS — J9811 Atelectasis: Secondary | ICD-10-CM | POA: Diagnosis not present

## 2020-01-22 DIAGNOSIS — Z800 Family history of malignant neoplasm of digestive organs: Secondary | ICD-10-CM

## 2020-01-22 DIAGNOSIS — E1169 Type 2 diabetes mellitus with other specified complication: Secondary | ICD-10-CM | POA: Diagnosis not present

## 2020-01-22 DIAGNOSIS — I517 Cardiomegaly: Secondary | ICD-10-CM | POA: Diagnosis not present

## 2020-01-22 DIAGNOSIS — Z87440 Personal history of urinary (tract) infections: Secondary | ICD-10-CM | POA: Diagnosis not present

## 2020-01-22 DIAGNOSIS — Z803 Family history of malignant neoplasm of breast: Secondary | ICD-10-CM

## 2020-01-22 DIAGNOSIS — E119 Type 2 diabetes mellitus without complications: Secondary | ICD-10-CM | POA: Diagnosis present

## 2020-01-22 DIAGNOSIS — E669 Obesity, unspecified: Secondary | ICD-10-CM | POA: Diagnosis not present

## 2020-01-22 DIAGNOSIS — R509 Fever, unspecified: Secondary | ICD-10-CM | POA: Diagnosis not present

## 2020-01-22 DIAGNOSIS — Z79899 Other long term (current) drug therapy: Secondary | ICD-10-CM | POA: Diagnosis not present

## 2020-01-22 DIAGNOSIS — I959 Hypotension, unspecified: Secondary | ICD-10-CM | POA: Diagnosis not present

## 2020-01-22 DIAGNOSIS — K7581 Nonalcoholic steatohepatitis (NASH): Secondary | ICD-10-CM | POA: Diagnosis not present

## 2020-01-22 DIAGNOSIS — R0902 Hypoxemia: Secondary | ICD-10-CM | POA: Diagnosis not present

## 2020-01-22 DIAGNOSIS — E1165 Type 2 diabetes mellitus with hyperglycemia: Secondary | ICD-10-CM | POA: Diagnosis not present

## 2020-01-22 DIAGNOSIS — I10 Essential (primary) hypertension: Secondary | ICD-10-CM | POA: Diagnosis not present

## 2020-01-22 LAB — COMPREHENSIVE METABOLIC PANEL
ALT: 23 U/L (ref 0–44)
AST: 39 U/L (ref 15–41)
Albumin: 2.4 g/dL — ABNORMAL LOW (ref 3.5–5.0)
Alkaline Phosphatase: 118 U/L (ref 38–126)
Anion gap: 10 (ref 5–15)
BUN: 22 mg/dL — ABNORMAL HIGH (ref 6–20)
CO2: 22 mmol/L (ref 22–32)
Calcium: 7.9 mg/dL — ABNORMAL LOW (ref 8.9–10.3)
Chloride: 101 mmol/L (ref 98–111)
Creatinine, Ser: 1.56 mg/dL — ABNORMAL HIGH (ref 0.61–1.24)
GFR calc Af Amer: 56 mL/min — ABNORMAL LOW (ref >60)
GFR calc non Af Amer: 48 mL/min — ABNORMAL LOW (ref >60)
Glucose, Bld: 307 mg/dL — ABNORMAL HIGH (ref 70–99)
Potassium: 3.5 mmol/L (ref 3.5–5.1)
Sodium: 133 mmol/L — ABNORMAL LOW (ref 135–145)
Total Bilirubin: 3.4 mg/dL — ABNORMAL HIGH (ref 0.3–1.2)
Total Protein: 5.9 g/dL — ABNORMAL LOW (ref 6.5–8.1)

## 2020-01-22 LAB — CBC WITH DIFFERENTIAL/PLATELET
Abs Immature Granulocytes: 0.02 10*3/uL (ref 0.00–0.07)
Basophils Absolute: 0 10*3/uL (ref 0.0–0.1)
Basophils Relative: 1 %
Eosinophils Absolute: 0.1 10*3/uL (ref 0.0–0.5)
Eosinophils Relative: 1 %
HCT: 24.9 % — ABNORMAL LOW (ref 39.0–52.0)
Hemoglobin: 8.8 g/dL — ABNORMAL LOW (ref 13.0–17.0)
Immature Granulocytes: 0 %
Lymphocytes Relative: 5 %
Lymphs Abs: 0.3 10*3/uL — ABNORMAL LOW (ref 0.7–4.0)
MCH: 34.1 pg — ABNORMAL HIGH (ref 26.0–34.0)
MCHC: 35.3 g/dL (ref 30.0–36.0)
MCV: 96.5 fL (ref 80.0–100.0)
Monocytes Absolute: 0.6 10*3/uL (ref 0.1–1.0)
Monocytes Relative: 8 %
Neutro Abs: 6.3 10*3/uL (ref 1.7–7.7)
Neutrophils Relative %: 85 %
Platelets: 45 10*3/uL — ABNORMAL LOW (ref 150–400)
RBC: 2.58 MIL/uL — ABNORMAL LOW (ref 4.22–5.81)
RDW: 14.6 % (ref 11.5–15.5)
Smear Review: NORMAL
WBC: 7.3 10*3/uL (ref 4.0–10.5)
nRBC: 0 % (ref 0.0–0.2)

## 2020-01-22 LAB — URINALYSIS, COMPLETE (UACMP) WITH MICROSCOPIC
Bilirubin Urine: NEGATIVE
Glucose, UA: >=500 mg/dL — AB
Ketones, ur: NEGATIVE mg/dL
Nitrite: NEGATIVE
Protein, ur: NEGATIVE mg/dL
Specific Gravity, Urine: 1.018 (ref 1.005–1.030)
pH: 5 (ref 5.0–8.0)

## 2020-01-22 LAB — PROTIME-INR
INR: 1.6 — ABNORMAL HIGH (ref 0.8–1.2)
Prothrombin Time: 18.2 seconds — ABNORMAL HIGH (ref 11.4–15.2)

## 2020-01-22 LAB — LACTIC ACID, PLASMA
Lactic Acid, Venous: 4 mmol/L (ref 0.5–1.9)
Lactic Acid, Venous: 4.1 mmol/L (ref 0.5–1.9)

## 2020-01-22 LAB — APTT: aPTT: 32 seconds (ref 24–36)

## 2020-01-22 LAB — GLUCOSE, CAPILLARY: Glucose-Capillary: 259 mg/dL — ABNORMAL HIGH (ref 70–99)

## 2020-01-22 LAB — SARS CORONAVIRUS 2 BY RT PCR (HOSPITAL ORDER, PERFORMED IN ~~LOC~~ HOSPITAL LAB): SARS Coronavirus 2: NEGATIVE

## 2020-01-22 MED ORDER — SODIUM CHLORIDE 0.9 % IV SOLN
2.0000 g | Freq: Once | INTRAVENOUS | Status: AC
  Administered 2020-01-22: 2 g via INTRAVENOUS
  Filled 2020-01-22: qty 2

## 2020-01-22 MED ORDER — SODIUM CHLORIDE 0.9 % IV BOLUS
1000.0000 mL | Freq: Once | INTRAVENOUS | Status: AC
  Administered 2020-01-22: 1000 mL via INTRAVENOUS

## 2020-01-22 MED ORDER — SODIUM CHLORIDE 0.9 % IV BOLUS (SEPSIS)
1000.0000 mL | Freq: Once | INTRAVENOUS | Status: AC
  Administered 2020-01-22: 1000 mL via INTRAVENOUS

## 2020-01-22 MED ORDER — INSULIN ASPART 100 UNIT/ML ~~LOC~~ SOLN
0.0000 [IU] | Freq: Every day | SUBCUTANEOUS | Status: DC
  Administered 2020-01-22: 3 [IU] via SUBCUTANEOUS
  Administered 2020-01-23: 2 [IU] via SUBCUTANEOUS
  Filled 2020-01-22 (×2): qty 1

## 2020-01-22 MED ORDER — ONDANSETRON HCL 4 MG/2ML IJ SOLN
4.0000 mg | Freq: Four times a day (QID) | INTRAMUSCULAR | Status: DC | PRN
  Filled 2020-01-22: qty 2

## 2020-01-22 MED ORDER — SODIUM CHLORIDE 0.9 % IV BOLUS
2200.0000 mL | Freq: Once | INTRAVENOUS | Status: AC
  Administered 2020-01-22: 2200 mL via INTRAVENOUS

## 2020-01-22 MED ORDER — METRONIDAZOLE IN NACL 5-0.79 MG/ML-% IV SOLN
500.0000 mg | Freq: Once | INTRAVENOUS | Status: AC
  Administered 2020-01-22: 500 mg via INTRAVENOUS
  Filled 2020-01-22: qty 100

## 2020-01-22 MED ORDER — INSULIN ASPART 100 UNIT/ML ~~LOC~~ SOLN
0.0000 [IU] | Freq: Three times a day (TID) | SUBCUTANEOUS | Status: DC
  Administered 2020-01-23 (×3): 5 [IU] via SUBCUTANEOUS
  Administered 2020-01-24 (×2): 3 [IU] via SUBCUTANEOUS
  Administered 2020-01-24: 2 [IU] via SUBCUTANEOUS
  Administered 2020-01-25: 3 [IU] via SUBCUTANEOUS
  Administered 2020-01-25: 5 [IU] via SUBCUTANEOUS
  Filled 2020-01-22 (×8): qty 1

## 2020-01-22 MED ORDER — ACETAMINOPHEN 650 MG RE SUPP: 650.0000 mg | Freq: Four times a day (QID) | RECTAL | Status: DC | PRN

## 2020-01-22 MED ORDER — ONDANSETRON HCL 4 MG PO TABS: 4.0000 mg | ORAL_TABLET | Freq: Four times a day (QID) | ORAL | Status: DC | PRN

## 2020-01-22 MED ORDER — ACETAMINOPHEN 325 MG PO TABS: 650.0000 mg | ORAL_TABLET | Freq: Four times a day (QID) | ORAL | Status: DC | PRN

## 2020-01-22 MED ORDER — LACTATED RINGERS IV SOLN: INTRAVENOUS | Status: DC

## 2020-01-22 MED ORDER — SODIUM CHLORIDE 0.9 % IV SOLN
2.0000 g | Freq: Three times a day (TID) | INTRAVENOUS | Status: DC
  Administered 2020-01-22 – 2020-01-25 (×8): 2 g via INTRAVENOUS
  Filled 2020-01-22 (×10): qty 2

## 2020-01-22 MED ORDER — ONDANSETRON HCL 4 MG/2ML IJ SOLN
4.0000 mg | Freq: Once | INTRAMUSCULAR | Status: AC
  Administered 2020-01-22: 4 mg via INTRAVENOUS
  Filled 2020-01-22: qty 2

## 2020-01-22 MED ORDER — VANCOMYCIN HCL IN DEXTROSE 1-5 GM/200ML-% IV SOLN
1000.0000 mg | Freq: Once | INTRAVENOUS | Status: AC
  Administered 2020-01-22: 1000 mg via INTRAVENOUS
  Filled 2020-01-22: qty 200

## 2020-01-22 NOTE — ED Notes (Signed)
Pt BP 79/30. MD Garba notified. See new orders. Saline bolus hung and given

## 2020-01-22 NOTE — Progress Notes (Signed)
Notified bedside nurse of need to draw repeat lactic acid  MD wants to use ideal body weight for fluids.

## 2020-01-22 NOTE — Progress Notes (Signed)
Notified bedside nurse of need to administer fluid bolus, pt needs 4014 cc.

## 2020-01-22 NOTE — Progress Notes (Signed)
CODE SEPSIS - PHARMACY COMMUNICATION  **Broad Spectrum Antibiotics should be administered within 1 hour of Sepsis diagnosis**  Time Code Sepsis Called/Page Received: 1531  Antibiotics Ordered: Vancomycin/cefepime/metronidazole  Time of 1st antibiotic administration: 1546 (cefepime)  Additional action taken by pharmacy: n/a  Benita Gutter 01/22/2020  3:51 PM

## 2020-01-22 NOTE — H&P (Signed)
History and Physical   Jesse Zimmerman:[id number] DOB: 1961/02/07 DOA: 01/22/2020  Referring MD/NP/PA: Dr Ponciano Ort  PCP: Jerrol Banana., MD   Outpatient Specialists: None   Patient coming from: Home  Chief Complaint: Fever  HPI: Jesse Zimmerman is a 59 y.o. male with medical history significant of diabetes, Jesse Zimmerman, hyperlipidemia, esophageal varices and chronic back pain who presented to the ER with fever chills and weakness.  Patient had a temperature 103.  Also shivering and confused.  Evaluation showed evidence of sepsis.  Source is urinary tract infection it appears.  Does not have indwelling catheter.  He has been relatively healthy.  Has had previous UTIs but not this bad.  Patient has elevated lactic acid and is hypotensive.  Is being admitted with severe sepsis secondary to urinary tract infection..  ED Course: Temperature 100.5 blood pressure 97/48 pulse 82 respirate of 19 oxygen sat 97% room air.  Sodium 133 potassium 3.5 chloride 104 CO2 22 glucose 307 BUN is 22 creatinine 1.56 lactic acid 4.0 and 4.1.  White count 7.3 hemoglobin 8.8 and platelet count of 45.  Urinalysis showed cloudy urine with moderate glucose large leukocytes WBC 21-50 and few bacteria.  Chest x-ray showed no acute findings.  Patient being admitted with sepsis due to UTI  Review of Systems: As per HPI otherwise 10 point review of systems negative.    Past Medical History:  Diagnosis Date  . Chronic back pain   . Cirrhosis ([place])    NASH  . Diabetes mellitus without complication ([place])   . Esophageal varices ([place])   . Hyperlipidemia   . NASH (nonalcoholic steatohepatitis)     Past Surgical History:  Procedure Laterality Date  . COLONOSCOPY WITH PROPOFOL N/A 08/12/2017   Procedure: COLONOSCOPY WITH PROPOFOL;  Surgeon: Jonathon Bellows, MD;  Location: [facility] ENDOSCOPY;  Service: Gastroenterology;  Laterality: N/A;  . ESOPHAGOGASTRODUODENOSCOPY (EGD) WITH PROPOFOL N/A 04/18/2017   Procedure:  ESOPHAGOGASTRODUODENOSCOPY (EGD) WITH PROPOFOL;  Surgeon: [name], MD;  Location: [facility] ENDOSCOPY;  Service: Endoscopy;  Laterality: N/A;  . ESOPHAGOGASTRODUODENOSCOPY (EGD) WITH PROPOFOL N/A 07/18/2018   Procedure: ESOPHAGOGASTRODUODENOSCOPY (EGD) WITH PROPOFOL;  Surgeon: [name], MD;  Location: [facility] ENDOSCOPY;  Service: Endoscopy;  Laterality: N/A;  . EXCESSIVE THIGH / HIP / BUTTOCK / FLANK SKIN EXCISION     PART OF INNER THIGH/DUE TO SEPSIS INFECTION REMOVED  . KIDNEY STONE SURGERY     removed  . TONSILLECTOMY AND ADENOIDECTOMY       reports that he has never smoked. He has never used smokeless tobacco. He reports that he does not drink alcohol and does not use drugs.  Allergies  Allergen Reactions  . Latex     LATEX TAPE  . Pineapple Hives    Family History  Problem Relation Age of Onset  . Hypertension Mother   . Breast cancer Mother   . Dementia Mother   . Heart attack Father   . Gout Father   . Hypertension Father   . Hypertension Sister   . Hypertension Sister   . Colon cancer Maternal Aunt   . Colon cancer Maternal Grandmother   . Colon cancer Paternal Grandmother      Prior to Admission medications   Medication Sig Start Date End Date Taking? Authorizing Provider  Empagliflozin-metFORMIN HCl ER (SYNJARDY XR) 03-999 MG TB24 Take 1 tablet by mouth daily. 08/31/19  Yes Jerrol Banana., MD  propranolol (INDERAL) 20 MG tablet Take 20 mg by mouth 2 (two) times  daily.    Yes [provider]  spironolactone (ALDACTONE) 100 MG tablet Take 1 tablet (100 mg total) by mouth 2 (two) times daily. Take 50 mg BID for 1 week and then increase to 100 mg BID 03/21/19  Yes Jerrol Banana., MD  tamsulosin [facility]) 0.4 MG CAPS capsule Take 1 capsule (0.4 mg total) by mouth daily. 05/11/18  Yes Jerrol Banana., MD  albuterol (VENTOLIN HFA) [id number] Base) MCG/ACT inhaler Inhale 2 puffs into the lungs every 6 (six) hours as needed for wheezing or  shortness of breath. 10/18/19   Jerrol Banana., MD  blood glucose meter kit and supplies Dispense based on patient and insurance preference. Use once daily as directed. (FOR ICD-10 E11.69). 01/03/20   Jerrol Banana., MD  furosemide (LASIX) 20 MG tablet Take 40 mg by mouth 2 (two) times daily. 11/26/19   [provider]  ondansetron (ZOFRAN ODT) 4 MG disintegrating tablet Take 1 tablet (4 mg total) by mouth every 8 (eight) hours as needed for nausea or vomiting. Patient not taking: Reported on 01/22/2020 11/20/18   Nena Polio, MD  promethazine (PHENERGAN) 25 MG tablet Take 1 tablet (25 mg total) by mouth every 8 (eight) hours as needed for nausea or vomiting. 12/25/19   Jerrol Banana., MD  traMADol Veatrice Bourbon) 50 MG tablet Take 1 to 2 tablets every 8 hours and as needed for pain Patient not taking: Reported on 01/22/2020 10/19/18   Jerrol Banana., MD  ursodiol (ACTIGALL) 300 MG capsule Take 300 mg by mouth 2 (two) times daily. 12/18/19   [provider]    Physical Exam: Vitals:   01/22/20 1527 01/22/20 1528 01/22/20 1600 01/22/20 1750  BP: (!) 104/46  (!) 97/48   Pulse: 82  82   Resp: 19  15   Temp:  (!) 100.5 F (38.1 C)  99.8 F (37.7 C)  TempSrc:  Oral  Oral  SpO2: 97%  99%   Weight:  133.8 kg    Height:  6' (1.829 m)        Constitutional: Acutely ill-looking, shivering, in obvious distress Vitals:   01/22/20 1527 01/22/20 1528 01/22/20 1600 01/22/20 1750  BP: (!) 104/46  (!) 97/48   Pulse: 82  82   Resp: 19  15   Temp:  (!) 100.5 F (38.1 C)  99.8 F (37.7 C)  TempSrc:  Oral  Oral  SpO2: 97%  99%   Weight:  133.8 kg    Height:  6' (1.829 m)     Eyes: PERRL, lids and conjunctivae normal ENMT: Mucous membranes are moist. Posterior pharynx clear of any exudate or lesions.Normal dentition.  Neck: normal, supple, no masses, no thyromegaly Respiratory: clear to auscultation bilaterally, no wheezing, no crackles. Normal respiratory  effort. No accessory muscle use.  Cardiovascular: Sinus tachycardia, no murmurs / rubs / gallops. No extremity edema. 2+ pedal pulses. No carotid bruits.  Abdomen: no tenderness, no masses palpated. No hepatosplenomegaly. Bowel sounds positive.  Musculoskeletal: no clubbing / cyanosis. No joint deformity upper and lower extremities. Good ROM, no contractures. Normal muscle tone.  Skin: Flushed skin, no rashes, lesions, ulcers. No induration Neurologic: CN 2-12 grossly intact. Sensation intact, DTR normal. Strength 5/5 in all 4.  Psychiatric: Normal judgment and insight. Alert and oriented x 3.  Depressed mood.     Labs on Admission: I have personally reviewed following labs and imaging studies  CBC: Recent Labs  Lab 01/22/20 1544  WBC 7.3  NEUTROABS 6.3  HGB 8.8*  HCT 24.9*  MCV 96.5  PLT 45*   Basic Metabolic Panel: Recent Labs  Lab 01/22/20 1544  NA 133*  K 3.5  CL 101  CO2 22  GLUCOSE 307*  BUN 22*  CREATININE 1.56*  CALCIUM 7.9*   GFR: Estimated Creatinine Clearance: 73.1 mL/min (A) (by C-G formula based on SCr of 1.56 mg/dL (H)). Liver Function Tests: Recent Labs  Lab 01/22/20 1544  AST 39  ALT 23  ALKPHOS 118  BILITOT 3.4*  PROT 5.9*  ALBUMIN 2.4*   No results for input(s): LIPASE, AMYLASE in the last 168 hours. No results for input(s): AMMONIA in the last 168 hours. Coagulation Profile: Recent Labs  Lab 01/22/20 1544  INR 1.6*   Cardiac Enzymes: No results for input(s): CKTOTAL, CKMB, CKMBINDEX, TROPONINI in the last 168 hours. BNP (last 3 results) Recent Labs    03/07/19 1520  PROBNP 290*   HbA1C: No results for input(s): HGBA1C in the last 72 hours. CBG: No results for input(s): GLUCAP in the last 168 hours. Lipid Profile: No results for input(s): CHOL, HDL, LDLCALC, TRIG, CHOLHDL, LDLDIRECT in the last 72 hours. Thyroid Function Tests: No results for input(s): TSH, T4TOTAL, FREET4, T3FREE, THYROIDAB in the last 72 hours. Anemia  Panel: No results for input(s): VITAMINB12, FOLATE, FERRITIN, TIBC, IRON, RETICCTPCT in the last 72 hours. Urine analysis:    Component Value Date/Time   COLORURINE AMBER (A) 01/22/2020 1805   APPEARANCEUR CLOUDY (A) 01/22/2020 1805   APPEARANCEUR Clear 06/11/2013 0902   LABSPEC 1.018 01/22/2020 1805   LABSPEC 1.031 06/11/2013 0902   PHURINE 5.0 01/22/2020 1805   GLUCOSEU >=500 (A) 01/22/2020 1805   GLUCOSEU >=500 06/11/2013 0902   HGBUR MODERATE (A) 01/22/2020 1805   BILIRUBINUR NEGATIVE 01/22/2020 1805   BILIRUBINUR negative 03/21/2019 1459   BILIRUBINUR Negative 06/11/2013 0902   KETONESUR NEGATIVE 01/22/2020 1805   PROTEINUR NEGATIVE 01/22/2020 1805   UROBILINOGEN 0.2 03/21/2019 1459   NITRITE NEGATIVE 01/22/2020 1805   LEUKOCYTESUR LARGE (A) 01/22/2020 1805   LEUKOCYTESUR 1+ 06/11/2013 0902   Sepsis Labs: _0 (procalcitonin:4,lacticidven:4) ) Recent Results (from the past 240 hour(s))  SARS Coronavirus 2 by RT PCR (hospital order, performed in [place] hospital lab) Nasopharyngeal Nasopharyngeal Swab     Status: None   Collection Time: 01/22/20  3:44 PM   Specimen: Nasopharyngeal Swab  Result Value Ref Range Status   SARS Coronavirus 2 NEGATIVE NEGATIVE Final    Comment: (NOTE) SARS-CoV-2 target nucleic acids are NOT DETECTED.  The SARS-CoV-2 RNA is generally detectable in upper and lower respiratory specimens during the acute phase of infection. The lowest concentration of SARS-CoV-2 viral copies this assay can detect is 250 copies / mL. A negative result does not preclude SARS-CoV-2 infection and should not be used as the sole basis for treatment or other patient management decisions.  A negative result may occur with improper specimen collection / handling, submission of specimen other than nasopharyngeal swab, presence of viral mutation(s) within the areas targeted by this assay, and inadequate number of viral copies (<250 copies / mL). A negative result  must be combined with clinical observations, patient history, and epidemiological information.  Fact Sheet for Patients:   StrictlyIdeas.no  Fact Sheet for Healthcare Providers: BankingDealers.co.za  This test is not yet approved or  cleared by the [place] FDA and has been authorized for detection and/or diagnosis of SARS-CoV-2 by FDA under an Emergency Use  Authorization (EUA).  This EUA will remain in effect (meaning this test can be used) for the duration of the COVID-19 declaration under Section 564(b)(1) of the Act, 21 U.S.C. section 360bbb-3(b)(1), unless the authorization is terminated or revoked sooner.  Performed at [facility], [address] [place], [place] [address]      Radiological Exams on Admission: DG Chest [facility] 1 View  Result Date: 01/22/2020 CLINICAL DATA:  Fever.  Questionable sepsis. EXAM: PORTABLE CHEST 1 VIEW COMPARISON:  03/18/2019. FINDINGS: Stable cardiomegaly and pulmonary venous congestion. Low lung volumes with bibasilar atelectasis. Similar findings noted on prior exam. No pleural effusion or pneumothorax. IMPRESSION: 1.  Stable cardiomegaly and pulmonary venous congestion. 2. Low lung volumes with bibasilar atelectasis. Similar findings noted on prior exam. Electronically Signed   By: [place]   On: 01/22/2020 15:42    EKG: Independently reviewed.  Sinus tachycardia no acute findings  Assessment/Plan Principal Problem:   Sepsis ([place]) Active Problems:   Diabetes mellitus type 2 in obese ([place])   HLD (hyperlipidemia)   ARF (acute renal failure) ([facility])   Thrombocytopenia ([facility])     #1 sepsis: Secondary to UTI.  Patient will be admitted to stepdown unit.  IV fluids following the sepsis protocol.  He received boluses with continuous IV fluids.  IV cefepime.  Cultures obtained both blood and urine and will follow results.  #2 diabetes: Patient will have sliding scale insulin with  Accu-Cheks.  Continue home regimen  #3 acute kidney injury: Most likely prerenal.  Hydrate and monitor renal function  #4 thrombocytopenia: Avoid heparin products.  Platelets very low.  Probably secondary to liver cirrhosis  #5 NASH cirrhosis: Patient will be monitored for bleed.   DVT prophylaxis: SCD Code Status: Full code Family Communication: Wife at bedside Disposition Plan: To be determined Consults called: None Admission status: Inpatient  Severity of Illness: The appropriate patient status for this patient is INPATIENT. Inpatient status is judged to be reasonable and necessary in order to provide the required intensity of service to ensure the patient's safety. The patient's presenting symptoms, physical exam findings, and initial radiographic and laboratory data in the context of their chronic comorbidities is felt to place them at high risk for further clinical deterioration. Furthermore, it is not anticipated that the patient will be medically stable for discharge from the hospital within 2 midnights of admission. The following factors support the patient status of inpatient.   " The patient's presenting symptoms include fever and chills. " The worrisome physical exam findings include mild hypotension. " The initial radiographic and laboratory data are worrisome because of evidence of sepsis. " The chronic co-morbidities include diabetes.   * I certify that at the point of admission it is my clinical judgment that the patient will require inpatient hospital care spanning beyond 2 midnights from the point of admission due to high intensity of service, high risk for further deterioration and high frequency of surveillance required.Barbette Merino MD [facility] Pager [phone]  If 7PM-7AM, please contact night-coverage [email or web address] Password [facility]  01/22/2020, 7:24 PM

## 2020-01-22 NOTE — Progress Notes (Signed)
Notified bedside nurse of need to draw blood cultures.

## 2020-01-22 NOTE — ED Provider Notes (Signed)
[facility] Emergency Department Provider Note  Time seen: 3:28 PM  I have reviewed the triage vital signs and the nursing notes.   HISTORY  Chief Complaint Fever   HPI Jesse Zimmerman is a 59 y.o. male with a past medical history of chronic pain, diabetes, hyperlipidemia, cirrhosis, presents to the emergency department  for generalized fatigue weakness and fever.  According to the patient since this morning around 10:00 he has had a fever as high as 102 per EMS.  Patient states he is coughing but states the cough is fairly chronic for him.  Denies any abdominal pain or chest pain.  Denies any dysuria but has noted dark urine.  Patient states he recently completed a course of prednisone which is caused his blood sugar to run very high.  Patient has received both Covid vaccinations a second which was in March 2021.  Past Medical History:  Diagnosis Date  . Chronic back pain   . Cirrhosis ([place])    NASH  . Diabetes mellitus without complication ([place])   . Esophageal varices ([place])   . Hyperlipidemia   . NASH (nonalcoholic steatohepatitis)     Patient Active Problem List   Diagnosis Date Noted  . Melena   . Acute gastric ulcer with hemorrhage   . Acute gastritis without hemorrhage   . GI bleed 07/17/2018  . Blood in stool   . Secondary esophageal varices without bleeding ([place])   . Portal hypertension ([place])   . Upper GI bleed 04/17/2017  . Contusion, back 12/26/2014  . Bruising 12/26/2014  . Back pain, chronic 11/22/2014  . Diabetes mellitus type 2 in obese ([place]) 11/22/2014  . 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 Acid reflux 11/22/2014  . HLD (hyperlipidemia) 11/22/2014  . BP (high blood pressure) 11/22/2014  . 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 Cannot sleep 11/22/2014  . Cirrhosis ([place]) 11/22/2014  . Adiposity 11/22/2014  . Change in blood platelet count 11/22/2014  . Avitaminosis D 11/22/2014  . 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 Esophageal and gastric  varices ([place]) 05/08/2012    Past Surgical History:  Procedure Laterality Date  . COLONOSCOPY WITH PROPOFOL N/A 08/12/2017   Procedure: COLONOSCOPY WITH PROPOFOL;  Surgeon: Jonathon Bellows, MD;  Location: [facility] ENDOSCOPY;  Service: Gastroenterology;  Laterality: N/A;  . ESOPHAGOGASTRODUODENOSCOPY (EGD) WITH PROPOFOL N/A 04/18/2017   Procedure: ESOPHAGOGASTRODUODENOSCOPY (EGD) WITH PROPOFOL;  Surgeon: [name], MD;  Location: [facility] ENDOSCOPY;  Service: Endoscopy;  Laterality: N/A;  . ESOPHAGOGASTRODUODENOSCOPY (EGD) WITH PROPOFOL N/A 07/18/2018   Procedure: ESOPHAGOGASTRODUODENOSCOPY (EGD) WITH PROPOFOL;  Surgeon: [name], MD;  Location: [facility] ENDOSCOPY;  Service: Endoscopy;  Laterality: N/A;  . EXCESSIVE THIGH / HIP / BUTTOCK / FLANK SKIN EXCISION     PART OF INNER THIGH/DUE TO SEPSIS INFECTION REMOVED  . KIDNEY STONE SURGERY     removed  . TONSILLECTOMY AND ADENOIDECTOMY      Prior to Admission medications   Medication Sig Start Date End Date Taking? Authorizing Provider  albuterol (VENTOLIN HFA) 108 (90 Base) MCG/ACT inhaler Inhale 2 puffs into the lungs every 6 (six) hours as needed for wheezing or shortness of breath. 10/18/19   Jerrol Banana., MD  blood glucose meter kit and supplies Dispense based on patient and insurance preference. Use once daily as directed. (FOR ICD-10 E11.69). 01/03/20   Jerrol Banana., MD  Canagliflozin-metFORMIN HCl ER (INVOKAMET XR) 50-1000 MG TB24 Take 1 tablet by mouth daily. Patient not taking: Reported on 10/18/2019 08/24/19  Jerrol Banana., MD  Empagliflozin-metFORMIN HCl ER (SYNJARDY XR) 03-999 MG TB24 Take 1 tablet by mouth daily. 08/31/19   Jerrol Banana., MD  furosemide (LASIX) 40 MG tablet Take 1 tablet (40 mg total) by mouth 2 (two) times daily. 03/21/19   Jerrol Banana., MD  HYDROcodone-acetaminophen (NORCO) 5-325 MG tablet Take 1 tablet by mouth every 4 (four) hours as needed for moderate pain. 10/18/19   Jerrol Banana., MD  hydrocortisone (ANUSOL-HC) 25 MG suppository Place 1 suppository (25 mg total) rectally at bedtime as needed for hemorrhoids or anal itching. Patient not taking: Reported on 08/15/2019 12/21/17   Jerrol Banana., MD  lactulose (CONSTULOSE) 10 GM/15ML solution Take 15 mLs (10 g total) by mouth 3 (three) times daily. Patient not taking: Reported on 08/15/2019 03/15/19   Earleen Newport, MD  loratadine (CLARITIN) 10 MG tablet TAKE 1 TABLET BY MOUTH EVERY DAY 10/12/19   Jerrol Banana., MD  meclizine (ANTIVERT) 25 MG tablet Take 1 tablet (25 mg total) by mouth 3 (three) times daily as needed for dizziness. Patient not taking: Reported on 08/15/2019 03/06/19   Jerrol Banana., MD  metolazone (ZAROXOLYN) 2.5 MG tablet Take 1 tablet (2.5 mg total) by mouth daily. Patient not taking: Reported on 01/03/2020 09/20/18   Jerrol Banana., MD  ondansetron (ZOFRAN ODT) 4 MG disintegrating tablet Take 1 tablet (4 mg total) by mouth every 8 (eight) hours as needed for nausea or vomiting. 11/20/18   Nena Polio, MD  ondansetron (ZOFRAN) 4 MG tablet Take 1 tablet (4 mg total) by mouth every 8 (eight) hours as needed for nausea or vomiting. Patient not taking: Reported on 11/08/2019 12/21/17   Jerrol Banana., MD  promethazine (PHENERGAN) 25 MG tablet Take 1 tablet (25 mg total) by mouth every 8 (eight) hours as needed for nausea or vomiting. 12/25/19   Jerrol Banana., MD  propranolol (INDERAL) 20 MG tablet Take 20 mg by mouth 2 (two) times daily.     [provider]  spironolactone (ALDACTONE) 100 MG tablet Take 1 tablet (100 mg total) by mouth 2 (two) times daily. Take 50 mg BID for 1 week and then increase to 100 mg BID 03/21/19   Jerrol Banana., MD  tamsulosin [facility]) 0.4 MG CAPS capsule Take 1 capsule (0.4 mg total) by mouth daily. 05/11/18   Jerrol Banana., MD  traMADol Veat[facility]) 50 MG tablet Take 1 to 2 tablets every 8 hours and as  needed for pain 10/19/18   Jerrol Banana., MD  Vitamin D, Ergocalciferol, (DRISDOL) 1.25 MG (50000 UNIT) CAPS capsule Take 1 capsule (50,000 Units total) by mouth every 7 (seven) days. Patient not taking: Reported on 01/03/2020 12/25/19   Jerrol Banana., MD    Allergies  Allergen Reactions  . Latex     LATEX TAPE  . Pineapple Hives    Family History  Problem Relation Age of Onset  . Hypertension Mother   . Breast cancer Mother   . Dementia Mother   . Heart attack Father   . Gout Father   . Hypertension Father   . Hypertension Sister   . Hypertension Sister   . Colon cancer Maternal Aunt   . Colon cancer Maternal Grandmother   . Colon cancer Paternal Grandmother     Social History Social History   Tobacco Use  . Smoking status: Never  Smoker  . Smokeless tobacco: Never Used  Substance Use Topics  . Alcohol use: No  . Drug use: No    Review of Systems Constitutional: Positive for fever.  Positive for generalized weakness. Cardiovascular: Negative for chest pain. Respiratory: Negative for shortness of breath.  Positive for cough which she states is chronic. Gastrointestinal: Negative for abdominal pain, vomiting Genitourinary: Dark urine without dysuria. Musculoskeletal: Negative for musculoskeletal complaints Neurological: Negative for headache All other ROS negative  ____________________________________________   PHYSICAL EXAM:  VITAL SIGNS: ED Triage Vitals [01/22/20 1528]  Enc Vitals Group     BP      Pulse      Resp      Temp (!) 100.5 F (38.1 C)     Temp Source Oral     SpO2      Weight      Height      Head Circumference      Peak Flow      Pain Score 0     Pain Loc      Pain Edu?      Excl. in [place]?    Constitutional: Alert and oriented. Well appearing and in no distress. Eyes: Normal exam ENT      Head: Normocephalic and atraumatic.      Mouth/Throat: Mucous membranes are moist. Cardiovascular: Normal rate, regular  rhythm. Respiratory: Normal respiratory effort without tachypnea nor retractions. Breath sounds are clear, with frequent cough. Gastrointestinal: Soft and nontender. No distention.  Obese. Musculoskeletal: Nontender with normal range of motion in all extremities. Neurologic:  Normal speech and language. No gross focal neurologic deficits  Skin:  Skin is warm, dry and intact.  Psychiatric: Mood and affect are normal.   ____________________________________________    EKG  EKG viewed and interpreted by myself shows a normal sinus rhythm 83 bpm with a narrow QRS, normal axis, normal intervals, nonspecific ST changes.  ____________________________________________    RADIOLOGY  Chest x-ray shows stable cardiomegaly.  ____________________________________________   INITIAL IMPRESSION / ASSESSMENT AND PLAN / ED COURSE  Pertinent labs & imaging results that were available during my care of the patient were reviewed by me and considered in my medical decision making (see chart for details).   Patient presents to the emergency department via emergency traffic as a code sepsis.  Patient presents with generalized fatigue weakness cough found to be febrile to 102 by EMS.  Upon arrival patient is awake alert oriented, only complains of generalized fatigue and dark urine.  Given the patient's fever to 102 generalized weakness we will check labs including cultures start broad-spectrum antibiotics.  Patient received 1 L of normal saline prior to arrival, we will dose additional liter of normal saline while awaiting results.  We will also obtain a Covid swab and chest x-ray.  Patient's work-up shows an elevated lactate of 4.0, patient receiving 30 mils per kilogram of IV fluids including the 1 L received prior to the emergency department by EMS.  The remainder the patient's work-up is largely at his baseline including anemia.  Urine is pending.  We will continue the broad-spectrum antibiotics as the  patient meets sepsis criteria based on his initially elevated respiratory rate temperature.  Also has borderline hypotension.  Patient will be admitted to the hospital service for further work-up and treatment.  Jesse Zimmerman was evaluated in Emergency Department on 01/22/2020 for the symptoms described in the history of present illness. He was evaluated in the context of the global COVID-19 pandemic,  which necessitated consideration that the patient might be at risk for infection with the SARS-CoV-2 virus that causes COVID-19. Institutional protocols and algorithms that pertain to the evaluation of patients at risk for COVID-19 are in a state of rapid change based on information released by regulatory bodies including the CDC and federal and state organizations. These policies and algorithms were followed during the patient's care in the ED.  CRITICAL CARE Performed by: Harvest Dark   Total critical care time: 30 minutes  Critical care time was exclusive of separately billable procedures and treating other patients.  Critical care was necessary to treat or prevent imminent or life-threatening deterioration.  Critical care was time spent personally by me on the following activities: development of treatment plan with patient and/or surrogate as well as nursing, discussions with consultants, evaluation of patient's response to treatment, examination of patient, obtaining history from patient or surrogate, ordering and performing treatments and interventions, ordering and review of laboratory studies, ordering and review of radiographic studies, pulse oximetry and re-evaluation of patient's condition.  ____________________________________________   FINAL CLINICAL IMPRESSION(S) / ED DIAGNOSES  Sepsis   Harvest Dark, MD 01/22/20 1821

## 2020-01-22 NOTE — ED Triage Notes (Signed)
Pt here with a fever that started this morning. PT has hx of DM, htn, and Meinier's disease. Pt NAD on arrival to ED.

## 2020-01-22 NOTE — Consult Note (Signed)
Pharmacy Antibiotic Note  Jesse Zimmerman is a 59 y.o. male with medical history including diabetes, cirrhosis c/b history of esophageal and gastric varices and thrombocytopenia admitted on 01/22/2020 with sepsis / UTI.  Pharmacy has been consulted for cefepime dosing.  Plan: Cefepime 2 g IV q8h  Continue to monitor renal function and adjust antibiotics as indicated.  Height: 6' (182.9 cm) Weight: 133.8 kg (295 lb) IBW/kg (Calculated) : 77.6  Temp (24hrs), Avg:100.2 F (37.9 C), Min:99.8 F (37.7 C), Max:100.5 F (38.1 C)  Recent Labs  Lab 01/22/20 1544 01/22/20 1806  WBC 7.3  --   CREATININE 1.56*  --   LATICACIDVEN 4.0* 4.1*    Estimated Creatinine Clearance: 73.1 mL/min (A) (by C-G formula based on SCr of 1.56 mg/dL (H)).    Allergies  Allergen Reactions  . Latex     LATEX TAPE  . Pineapple Hives    Antimicrobials this admission: Vancomycin, cefepime, and metronidazole x 1 in the ED on 8/3  Cefepime 8/3 >>   Dose adjustments this admission: n/a  Microbiology results: 8/3 BCx: pending 8/3 UCx: pending   Thank you for allowing pharmacy to be a part of this patient's care.  Benita Gutter 01/22/2020 7:25 PM

## 2020-01-22 NOTE — Consult Note (Signed)
PHARMACY -  BRIEF ANTIBIOTIC NOTE   Pharmacy has received consult(s) for cefepime and vancomycin from an ED provider. The patient's profile has been reviewed for ht/wt/allergies/indication/available labs.    One time order(s) placed for  --Cefepime 2 g (already ordered) --Vancomycin 1 g (already ordered)  Further antibiotics/pharmacy consults should be ordered by admitting physician if indicated.                   Thank you, Benita Gutter 01/22/2020  3:40 PM

## 2020-01-22 NOTE — ED Notes (Signed)
Temperature rechecked per family request. 99 oral

## 2020-01-22 NOTE — ED Notes (Signed)
Temperature rechecked per family request. 99.8 oral, extra blanket removed from pt.

## 2020-01-22 NOTE — Progress Notes (Signed)
Notified bedside nurse of need to order repeat lactic acid.  

## 2020-01-22 NOTE — Progress Notes (Signed)
Notified provider of need to administer fluid bolus, pt needs 4014 cc.

## 2020-01-23 LAB — GLUCOSE, CAPILLARY
Glucose-Capillary: 226 mg/dL — ABNORMAL HIGH (ref 70–99)
Glucose-Capillary: 206 mg/dL — ABNORMAL HIGH (ref 70–99)
Glucose-Capillary: 212 mg/dL — ABNORMAL HIGH (ref 70–99)
Glucose-Capillary: 214 mg/dL — ABNORMAL HIGH (ref 70–99)

## 2020-01-23 LAB — HIV ANTIBODY (ROUTINE TESTING W REFLEX): HIV Screen 4th Generation wRfx: NONREACTIVE

## 2020-01-23 LAB — CBC
HCT: 25.4 % — ABNORMAL LOW (ref 39.0–52.0)
Hemoglobin: 8.5 g/dL — ABNORMAL LOW (ref 13.0–17.0)
MCH: 34 pg (ref 26.0–34.0)
MCHC: 33.5 g/dL (ref 30.0–36.0)
MCV: 101.6 fL — ABNORMAL HIGH (ref 80.0–100.0)
Platelets: 39 10*3/uL — ABNORMAL LOW (ref 150–400)
RBC: 2.5 MIL/uL — ABNORMAL LOW (ref 4.22–5.81)
RDW: 15.3 % (ref 11.5–15.5)
WBC: 13.8 10*3/uL — ABNORMAL HIGH (ref 4.0–10.5)
nRBC: 0 % (ref 0.0–0.2)

## 2020-01-23 LAB — COMPREHENSIVE METABOLIC PANEL
ALT: 22 U/L (ref 0–44)
AST: 37 U/L (ref 15–41)
Albumin: 2.3 g/dL — ABNORMAL LOW (ref 3.5–5.0)
Alkaline Phosphatase: 78 U/L (ref 38–126)
Anion gap: 9 (ref 5–15)
BUN: 27 mg/dL — ABNORMAL HIGH (ref 6–20)
CO2: 21 mmol/L — ABNORMAL LOW (ref 22–32)
Calcium: 7.5 mg/dL — ABNORMAL LOW (ref 8.9–10.3)
Chloride: 104 mmol/L (ref 98–111)
Creatinine, Ser: 1.77 mg/dL — ABNORMAL HIGH (ref 0.61–1.24)
GFR calc Af Amer: 48 mL/min — ABNORMAL LOW (ref >60)
GFR calc non Af Amer: 41 mL/min — ABNORMAL LOW (ref >60)
Glucose, Bld: 265 mg/dL — ABNORMAL HIGH (ref 70–99)
Potassium: 3.9 mmol/L (ref 3.5–5.1)
Sodium: 134 mmol/L — ABNORMAL LOW (ref 135–145)
Total Bilirubin: 3 mg/dL — ABNORMAL HIGH (ref 0.3–1.2)
Total Protein: 5.6 g/dL — ABNORMAL LOW (ref 6.5–8.1)

## 2020-01-23 LAB — LACTIC ACID, PLASMA: Lactic Acid, Venous: 2.8 mmol/L (ref 0.5–1.9)

## 2020-01-23 LAB — CORTISOL-AM, BLOOD: Cortisol - AM: 24.9 ug/dL — ABNORMAL HIGH (ref 6.7–22.6)

## 2020-01-23 LAB — PROTIME-INR
INR: 1.8 — ABNORMAL HIGH (ref 0.8–1.2)
Prothrombin Time: 19.8 seconds — ABNORMAL HIGH (ref 11.4–15.2)

## 2020-01-23 LAB — URINE CULTURE

## 2020-01-23 LAB — PROCALCITONIN: Procalcitonin: 19.63 ng/mL

## 2020-01-23 NOTE — H&P (Signed)
Progress note   LANCELOT ALYEA PZW:[id number] DOB: 1961/01/29 DOA: 01/22/2020  Referring MD/NP/PA: Dr Ponciano Ort  PCP: Jerrol Banana., MD   Outpatient Specialists: None   Patient coming from: Home  Chief Complaint: Fever  HPI: Jesse Zimmerman is a 59 y.o. male with medical history significant of diabetes, [name], hyperlipidemia, esophageal varices and chronic back pain who presented to the ER with fever chills and weakness.  Patient had a temperature 103.  Also shivering and confused.  Evaluation showed evidence of sepsis.  Source is urinary tract infection it appears.  Does not have indwelling catheter.  He has been relatively healthy.  Has had previous UTIs but not this bad.  Patient has elevated lactic acid and is hypotensive.  Is being admitted with severe sepsis secondary to urinary tract infection..  ED Course: Temperature 100.5 blood pressure 97/48 pulse 82 respirate of 19 oxygen sat 97% room air.  Sodium 133 potassium 3.5 chloride 104 CO2 22 glucose 307 BUN is 22 creatinine 1.56 lactic acid 4.0 and 4.1.  White count 7.3 hemoglobin 8.8 and platelet count of 45.  Urinalysis showed cloudy urine with moderate glucose large leukocytes WBC 21-50 and few bacteria.  Chest x-ray showed no acute findings.  Patient being admitted with sepsis due to UTI  Review of Systems: As per HPI otherwise 10 point review of systems negative.    Past Medical History:  Diagnosis Date  . Chronic back pain   . 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 Diabetes mellitus without complication ([place])   . Esophageal varices ([place])   . Hyperlipidemia   . NASH (nonalcoholic steatohepatitis)     Past Surgical History:  Procedure Laterality Date  . COLONOSCOPY WITH PROPOFOL N/A 08/12/2017   Procedure: COLONOSCOPY WITH PROPOFOL;  Surgeon: Jonathon Bellows, MD;  Location: [facility] ENDOSCOPY;  Service: Gastroenterology;  Laterality: N/A;  . ESOPHAGOGASTRODUODENOSCOPY (EGD) WITH PROPOFOL N/A 04/18/2017   Procedure:  ESOPHAGOGASTRODUODENOSCOPY (EGD) WITH PROPOFOL;  Surgeon: [name], MD;  Location: [facility] ENDOSCOPY;  Service: Endoscopy;  Laterality: N/A;  . ESOPHAGOGASTRODUODENOSCOPY (EGD) WITH PROPOFOL N/A 07/18/2018   Procedure: ESOPHAGOGASTRODUODENOSCOPY (EGD) WITH PROPOFOL;  Surgeon: [name], MD;  [facility];  Service: Endoscopy;  Laterality: N/A;  . EXCESSIVE THIGH / HIP / BUTTOCK / FLANK SKIN EXCISION     PART OF INNER THIGH/DUE TO SEPSIS INFECTION REMOVED  . KIDNEY STONE SURGERY     removed  . TONSILLECTOMY AND ADENOIDECTOMY       reports that he has never smoked. He has never used smokeless tobacco. He reports that he does not drink alcohol and does not use drugs.  Allergies  Allergen Reactions  . Latex     LATEX TAPE  . Pineapple Hives    Family History  Problem Relation Age of Onset  . Hypertension Mother   . Breast cancer Mother   . Dementia Mother   . Heart attack Father   . Gout Father   . Hypertension Father   . Hypertension Sister   . Hypertension Sister   . Colon cancer Maternal Aunt   . Colon cancer Maternal Grandmother   . Colon cancer Paternal Grandmother      Prior to Admission medications   Medication Sig Start Date End Date Taking? Authorizing Provider  Empagliflozin-metFORMIN HCl ER (SYNJARDY XR) 03-999 MG TB24 Take 1 tablet by mouth daily. 08/31/19  Yes Jerrol Banana., MD  propranolol (INDERAL) 20 MG tablet Take 20 mg by mouth 2 (two) times daily.  Yes [provider]  spironolactone (ALDACTONE) 100 MG tablet Take 1 tablet (100 mg total) by mouth 2 (two) times daily. Take 50 mg BID for 1 week and then increase to 100 mg BID 03/21/19  Yes Jerrol Banana., MD  tamsulosin [facility]) 0.4 MG CAPS capsule Take 1 capsule (0.4 mg total) by mouth daily. 05/11/18  Yes Jerrol Banana., MD  albuterol (VENTOLIN HFA) 108 [phone] Base) MCG/ACT inhaler Inhale 2 puffs into the lungs every 6 (six) hours as needed for wheezing or  shortness of breath. 10/18/19   Jerrol Banana., MD  blood glucose meter kit and supplies Dispense based on patient and insurance preference. Use once daily as directed. (FOR ICD-10 E11.69). 01/03/20   Jerrol Banana., MD  furosemide (LASIX) 20 MG tablet Take 40 mg by mouth 2 (two) times daily. 11/26/19   [provider]  ondansetron (ZOFRAN ODT) 4 MG disintegrating tablet Take 1 tablet (4 mg total) by mouth every 8 (eight) hours as needed for nausea or vomiting. Patient not taking: Reported on 01/22/2020 11/20/18   Nena Polio, MD  promethazine (PHENERGAN) 25 MG tablet Take 1 tablet (25 mg total) by mouth every 8 (eight) hours as needed for nausea or vomiting. 12/25/19   Jerrol Banana., MD  traMADol [facility]at[facility]) 50 MG tablet Take 1 to 2 tablets every 8 hours and as needed for pain Patient not taking: Reported on 01/22/2020 10/19/18   Jerrol Banana., MD  ursodiol (ACTIGALL) 300 MG capsule Take 300 mg by mouth 2 (two) times daily. 12/18/19   [provider]    Physical Exam: Vitals:   01/23/20 1532 01/23/20 1550 01/23/20 1620 01/23/20 1650  BP:   (!) 86/46   Pulse: 69 66 68 70  Resp: 18 16 16 14   Temp:      TempSrc:      SpO2: 96% 98% 97% 100%  Weight:      Height:          Constitutional: Acutely ill-looking, shivering, in obvious distress Vitals:   01/23/20 1532 01/23/20 1550 01/23/20 1620 01/23/20 1650  BP:   (!) 86/46   Pulse: 69 66 68 70  Resp: 18 16 16 14   Temp:      TempSrc:      SpO2: 96% 98% 97% 100%  Weight:      Height:       Eyes: PERRL, lids and conjunctivae normal ENMT: Mucous membranes are moist. Posterior pharynx clear of any exudate or lesions.Normal dentition.  Neck: normal, supple, no masses, no thyromegaly Respiratory: clear to auscultation bilaterally, no wheezing, no crackles. Normal respiratory effort. No accessory muscle use.  Cardiovascular: Sinus tachycardia, no murmurs / rubs / gallops. No extremity edema. 2+ pedal  pulses. No carotid bruits.  Abdomen: no tenderness, no masses palpated. No hepatosplenomegaly. Bowel sounds positive.  Musculoskeletal: no clubbing / cyanosis. No joint deformity upper and lower extremities. Good ROM, no contractures. Normal muscle tone.  Skin:  no rashes, lesions, ulcers. No induration Neurologic: CN 2-12 grossly intact. Strength 5/5 in all 4.  Psychiatric: Normal judgment and insight. Alert and oriented x 3.      Labs on Admission: I have personally reviewed following labs and imaging studies  CBC: Recent Labs  Lab 01/22/20 1544 01/23/20 0244  WBC 7.3 13.8*  NEUTROABS 6.3  --   HGB 8.8* 8.5*  HCT 24.9* 25.4*  MCV 96.5 101.6*  PLT 45* 39*  Basic Metabolic Panel: Recent Labs  Lab 01/22/20 1544 01/23/20 0244  NA 133* 134*  K 3.5 3.9  CL 101 104  CO2 22 21*  GLUCOSE 307* 265*  BUN 22* 27*  CREATININE 1.56* 1.77*  CALCIUM 7.9* 7.5*   GFR: Estimated Creatinine Clearance: 64.4 mL/min (A) (by C-G formula based on SCr of 1.77 mg/dL (H)). Liver Function Tests: Recent Labs  Lab 01/22/20 1544 01/23/20 0244  AST 39 37  ALT 23 22  ALKPHOS 118 78  BILITOT 3.4* 3.0*  PROT 5.9* 5.6*  ALBUMIN 2.4* 2.3*   No results for input(s): LIPASE, AMYLASE in the last 168 hours. No results for input(s): AMMONIA in the last 168 hours. Coagulation Profile: Recent Labs  Lab 01/22/20 1544 01/23/20 0244  INR 1.6* 1.8*   Cardiac Enzymes: No results for input(s): CKTOTAL, CKMB, CKMBINDEX, TROPONINI in the last 168 hours. BNP (last 3 results) Recent Labs    03/07/19 1520  PROBNP 290*   HbA1C: No results for input(s): HGBA1C in the last 72 hours. CBG: Recent Labs  Lab 01/22/20 2355 01/23/20 0756 01/23/20 1351 01/23/20 1645  GLUCAP 259* 226* 206* 212*   Lipid Profile: No results for input(s): CHOL, HDL, LDLCALC, TRIG, CHOLHDL, LDLDIRECT in the last 72 hours. Thyroid Function Tests: No results for input(s): TSH, T4TOTAL, FREET4, T3FREE, THYROIDAB in the  last 72 hours. Anemia Panel: No results for input(s): VITAMINB12, FOLATE, FERRITIN, TIBC, IRON, RETICCTPCT in the last 72 hours. Urine analysis:    Component Value Date/Time   COLORURINE AMBER (A) 01/22/2020 1805   APPEARANCEUR CLOUDY (A) 01/22/2020 1805   APPEARANCEUR Clear 06/11/2013 0902   LABSPEC 1.018 01/22/2020 1805   LABSPEC 1.031 06/11/2013 0902   PHURINE 5.0 01/22/2020 1805   GLUCOSEU >=500 (A) 01/22/2020 1805   GLUCOSEU >=500 06/11/2013 0902   HGBUR MODERATE (A) 01/22/2020 1805   BILIRUBINUR NEGATIVE 01/22/2020 1805   BILIRUBINUR negative 03/21/2019 1459   BILIRUBINUR Negative 06/11/2013 0902   KETONESUR NEGATIVE 01/22/2020 1805   PROTEINUR NEGATIVE 01/22/2020 1805   UROBILINOGEN 0.2 03/21/2019 1459   NITRITE NEGATIVE 01/22/2020 1805   LEUKOCYTESUR LARGE (A) 01/22/2020 1805   LEUKOCYTESUR 1+ 06/11/2013 0902    Recent Results (from the past 240 hour(s))  Blood Culture (routine x 2)     Status: None (Preliminary result)   Collection Time: 01/22/20  3:44 PM   Specimen: BLOOD  Result Value Ref Range Status   Specimen Description BLOOD  Final   Special Requests NONE  Final   Culture   Final    NO GROWTH < 24 HOURS Performed at [facility], [address], [place], [place] [address]    Report Status PENDING  Incomplete  Blood Culture (routine x 2)     Status: None (Preliminary result)   Collection Time: 01/22/20  3:44 PM   Specimen: BLOOD  Result Value Ref Range Status   Specimen Description BLOOD  Final   Special Requests NONE  Final   Culture   Final    NO GROWTH < 24 HOURS Performed at [facility], [address], [place],  [address]    Report Status PENDING  Incomplete  SARS Coronavirus 2 by RT PCR (hospital order, performed in [place] hospital lab) Nasopharyngeal Nasopharyngeal Swab     Status: None   Collection Time: 01/22/20  3:44 PM   Specimen: Nasopharyngeal Swab  Result Value Ref Range Status   SARS Coronavirus 2  NEGATIVE NEGATIVE Final    Comment: (NOTE) SARS-CoV-2  target nucleic acids are NOT DETECTED.  The SARS-CoV-2 RNA is generally detectable in upper and lower respiratory specimens during the acute phase of infection. The lowest concentration of SARS-CoV-2 viral copies this assay can detect is 250 copies / mL. A negative result does not preclude SARS-CoV-2 infection and should not be used as the sole basis for treatment or other patient management decisions.  A negative result may occur with improper specimen collection / handling, submission of specimen other than nasopharyngeal swab, presence of viral mutation(s) within the areas targeted by this assay, and inadequate number of viral copies (<250 copies / mL). A negative result must be combined with clinical observations, patient history, and epidemiological information.  Fact Sheet for Patients:   StrictlyIdeas.no  Fact Sheet for Healthcare Providers: BankingDealers.co.za  This test is not yet approved or  cleared by the [place] FDA and has been authorized for detection and/or diagnosis of SARS-CoV-2 by FDA under an Emergency Use Authorization (EUA).  This EUA will remain in effect (meaning this test can be used) for the duration of the COVID-19 declaration under Section 564(b)(1) of the Act, 21 U.S.C. section 360bbb-3(b)(1), unless the authorization is terminated or revoked sooner.  Performed at [facility], [address], [place], [place] [address]      Radiological Exams on Admission: DG Chest [facility] 1 View  Result Date: 01/22/2020 CLINICAL DATA:  Fever.  Questionable sepsis. EXAM: PORTABLE CHEST 1 VIEW COMPARISON:  03/18/2019. FINDINGS: Stable cardiomegaly and pulmonary venous congestion. Low lung volumes with bibasilar atelectasis. Similar findings noted on prior exam. No pleural effusion or pneumothorax. IMPRESSION: 1.  Stable cardiomegaly and pulmonary venous  congestion. 2. Low lung volumes with bibasilar atelectasis. Similar findings noted on prior exam. Electronically Signed   By: Colo   On: 01/22/2020 15:42    EKG: Independently reviewed.  Sinus tachycardia no acute findings  Assessment/Plan Principal Problem:   Sepsis ([place]) Active Problems:   Diabetes mellitus type 2 in obese ([place])   HLD (hyperlipidemia)   ARF (acute renal failure) ([facility])   Thrombocytopenia ([facility])    #1 sepsis: Secondary to UTI.  Patient will be admitted to stepdown unit.  IV fluids following the sepsis protocol.  He received boluses with continuous IV fluids.  IV cefepime.  Cultures obtained both blood and urine and will follow results.  #2 diabetes: Patient will have sliding scale insulin with Accu-Cheks.  Continue home regimen  #3 acute kidney injury: Most likely prerenal.  Hydrate and monitor renal function  #4 thrombocytopenia: Avoid heparin products.  Platelets very low.  Probably secondary to liver cirrhosis  #5 NASH cirrhosis: Patient will be monitored for bleed.   DVT prophylaxis: SCD Code Status: Full code Family Communication: Wife at bedside Disposition Plan: To be determined Consults called: None Admission status: Inpatient  Severity of Illness: The appropriate patient status for this patient is INPATIENT. Inpatient status is judged to be reasonable and necessary in order to provide the required intensity of service to ensure the patient's safety. The patient's presenting symptoms, physical exam findings, and initial radiographic and laboratory data in the context of their chronic comorbidities is felt to place them at high risk for further clinical deterioration. Furthermore, it is not anticipated that the patient will be medically stable for discharge from the hospital within 2 midnights of admission. The following factors support the patient status of inpatient.   " The patient's presenting symptoms include fever and chills. " The  worrisome physical exam findings include  mild hypotension. " The initial radiographic and laboratory data are worrisome because of evidence of sepsis. " The chronic co-morbidities include diabetes.    I certify that at the point of admission it is my clinical judgment that the patient will require inpatient hospital care spanning beyond 2 midnights from the point of admission due to high intensity of service, high risk for further deterioration and high frequency of surveillance required.  Para Skeans MD [facility] Pager [phone] If 7PM-7AM, please contact night-coverage [email or web address] Password [email or web address]  01/23/2020, 5:13 PM

## 2020-01-23 NOTE — ED Notes (Signed)
Pt awake  meds given.  Iv fluids infusing.

## 2020-01-23 NOTE — ED Notes (Signed)
Resumed care from amber rn.  Pt sleeping.  Iv fluids infusing.  nsr on monitor.  Pt waiting on admission.

## 2020-01-23 NOTE — ED Notes (Signed)
Eating dinner tray

## 2020-01-23 NOTE — ED Notes (Signed)
Family with pt

## 2020-01-23 NOTE — ED Notes (Signed)
Pt resting quietly.  Iv fluids infusing.   Family with pt

## 2020-01-23 NOTE — ED Notes (Signed)
fsbs 212

## 2020-01-23 NOTE — ED Notes (Signed)
Pt given meal tray and assisted with sanitizing hands.

## 2020-01-23 NOTE — ED Notes (Signed)
Report off to tom rn

## 2020-01-23 NOTE — ED Notes (Signed)
Patient placed in hospital bed

## 2020-01-23 NOTE — ED Notes (Signed)
This RN at bedside. Pt BP taken twice with repeat BP 89/42 (57). This RN also requested hospital bed for pt.

## 2020-01-24 ENCOUNTER — Other Ambulatory Visit: Payer: Self-pay

## 2020-01-24 ENCOUNTER — Encounter: Payer: Self-pay | Admitting: Internal Medicine

## 2020-01-24 LAB — COMPREHENSIVE METABOLIC PANEL
ALT: 19 U/L (ref 0–44)
AST: 33 U/L (ref 15–41)
Albumin: 2.1 g/dL — ABNORMAL LOW (ref 3.5–5.0)
Alkaline Phosphatase: 61 U/L (ref 38–126)
Anion gap: 8 (ref 5–15)
BUN: 36 mg/dL — ABNORMAL HIGH (ref 6–20)
CO2: 21 mmol/L — ABNORMAL LOW (ref 22–32)
Calcium: 7.8 mg/dL — ABNORMAL LOW (ref 8.9–10.3)
Chloride: 105 mmol/L (ref 98–111)
Creatinine, Ser: 1.7 mg/dL — ABNORMAL HIGH (ref 0.61–1.24)
GFR calc Af Amer: 50 mL/min — ABNORMAL LOW (ref >60)
GFR calc non Af Amer: 43 mL/min — ABNORMAL LOW (ref >60)
Glucose, Bld: 195 mg/dL — ABNORMAL HIGH (ref 70–99)
Potassium: 3.6 mmol/L (ref 3.5–5.1)
Sodium: 134 mmol/L — ABNORMAL LOW (ref 135–145)
Total Bilirubin: 2 mg/dL — ABNORMAL HIGH (ref 0.3–1.2)
Total Protein: 5.4 g/dL — ABNORMAL LOW (ref 6.5–8.1)

## 2020-01-24 LAB — CBC WITH DIFFERENTIAL/PLATELET
Abs Immature Granulocytes: 0.06 10*3/uL (ref 0.00–0.07)
Basophils Absolute: 0 10*3/uL (ref 0.0–0.1)
Basophils Relative: 0 %
Eosinophils Absolute: 0.3 10*3/uL (ref 0.0–0.5)
Eosinophils Relative: 4 %
HCT: 22.7 % — ABNORMAL LOW (ref 39.0–52.0)
Hemoglobin: 7.7 g/dL — ABNORMAL LOW (ref 13.0–17.0)
Immature Granulocytes: 1 %
Lymphocytes Relative: 10 %
Lymphs Abs: 0.8 10*3/uL (ref 0.7–4.0)
MCH: 33.8 pg (ref 26.0–34.0)
MCHC: 33.9 g/dL (ref 30.0–36.0)
MCV: 99.6 fL (ref 80.0–100.0)
Monocytes Absolute: 0.6 10*3/uL (ref 0.1–1.0)
Monocytes Relative: 8 %
Neutro Abs: 5.7 10*3/uL (ref 1.7–7.7)
Neutrophils Relative %: 77 %
Platelets: 34 10*3/uL — ABNORMAL LOW (ref 150–400)
RBC: 2.28 MIL/uL — ABNORMAL LOW (ref 4.22–5.81)
RDW: 15.7 % — ABNORMAL HIGH (ref 11.5–15.5)
WBC: 7.5 10*3/uL (ref 4.0–10.5)
nRBC: 0 % (ref 0.0–0.2)

## 2020-01-24 LAB — GLUCOSE, CAPILLARY
Glucose-Capillary: 176 mg/dL — ABNORMAL HIGH (ref 70–99)
Glucose-Capillary: 199 mg/dL — ABNORMAL HIGH (ref 70–99)
Glucose-Capillary: 187 mg/dL — ABNORMAL HIGH (ref 70–99)
Glucose-Capillary: 199 mg/dL — ABNORMAL HIGH (ref 70–99)

## 2020-01-24 LAB — PREPARE RBC (CROSSMATCH)

## 2020-01-24 LAB — MAGNESIUM: Magnesium: 1.7 mg/dL (ref 1.7–2.4)

## 2020-01-24 LAB — HEMOGLOBIN AND HEMATOCRIT, BLOOD
HCT: 24.7 % — ABNORMAL LOW (ref 39.0–52.0)
Hemoglobin: 8.5 g/dL — ABNORMAL LOW (ref 13.0–17.0)

## 2020-01-24 MED ORDER — SODIUM CHLORIDE 0.9% IV SOLUTION
Freq: Once | INTRAVENOUS | Status: DC
  Filled 2020-01-24: qty 250

## 2020-01-24 NOTE — H&P (Signed)
Progress note   Jesse Zimmerman:[id number] DOB: 08/20/1960 DOA: 01/22/2020  Referring MD/NP/PA: Dr Ponciano Ort  PCP: Jerrol Banana., MD   Outpatient Specialists: None   Patient coming from: Home  Chief Complaint: Fever  HPI: Jesse Zimmerman is a 59 y.o. male with medical history significant of diabetes, Jesse Zimmerman, hyperlipidemia, esophageal varices and chronic back pain who presented to the ER with fever chills and weakness.  Patient had a temperature 103.  Also shivering and confused.  Evaluation showed evidence of sepsis.  Source is urinary tract infection it appears.  Does not have indwelling catheter.  He has been relatively healthy.  Has had previous UTIs but not this bad.  Patient has elevated lactic acid and is hypotensive.  Is being admitted with severe sepsis secondary to urinary tract infection..  ED Course: Temperature 100.5 blood pressure 97/48 pulse 82 respirate of 19 oxygen sat 97% room air.  Sodium 133 potassium 3.5 chloride 104 CO2 22 glucose 307 BUN is 22 creatinine 1.56 lactic acid 4.0 and 4.1.  White count 7.3 hemoglobin 8.8 and platelet count of 45.  Urinalysis showed cloudy urine with moderate glucose large leukocytes WBC 21-50 and few bacteria.  Chest x-ray showed no acute findings.  Patient being admitted with sepsis due to UTI  Review of Systems: As per HPI otherwise 10 point review of systems negative.    Past Medical History:  Diagnosis Date  . Chronic back pain   . 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 Diabetes mellitus without complication ([place])   . Esophageal varices ([place])   . Hyperlipidemia   . NASH (nonalcoholic steatohepatitis)     Past Surgical History:  Procedure Laterality Date  . COLONOSCOPY WITH PROPOFOL N/A 08/12/2017   Procedure: COLONOSCOPY WITH PROPOFOL;  Surgeon: Jonathon Bellows, MD;  Location: [facility] ENDOSCOPY;  Service: Gastroenterology;  Laterality: N/A;  . ESOPHAGOGASTRODUODENOSCOPY (EGD) WITH PROPOFOL N/A 04/18/2017   Procedure:  ESOPHAGOGASTRODUODENOSCOPY (EGD) WITH PROPOFOL;  Surgeon: [name], MD;  Location: [facility] ENDOSCOPY;  Service: Endoscopy;  Laterality: N/A;  . ESOPHAGOGASTRODUODENOSCOPY (EGD) WITH PROPOFOL N/A 07/18/2018   Procedure: ESOPHAGOGASTRODUODENOSCOPY (EGD) WITH PROPOFOL;  Surgeon: [name], MD;  Location: [facility] ENDOSCOPY;  Service: Endoscopy;  Laterality: N/A;  . EXCESSIVE THIGH / HIP / BUTTOCK / FLANK SKIN EXCISION     PART OF INNER THIGH/DUE TO SEPSIS INFECTION REMOVED  . KIDNEY STONE SURGERY     removed  . TONSILLECTOMY AND ADENOIDECTOMY       reports that he has never smoked. He has never used smokeless tobacco. He reports that he does not drink alcohol and does not use drugs.  Allergies  Allergen Reactions  . Latex     LATEX TAPE  . Pineapple Hives    Family History  Problem Relation Age of Onset  . Hypertension Mother   . Breast cancer Mother   . Dementia Mother   . Heart attack Father   . Gout Father   . Hypertension Father   . Hypertension Sister   . Hypertension Sister   . Colon cancer Maternal Aunt   . Colon cancer Maternal Grandmother   . Colon cancer Paternal Grandmother      Prior to Admission medications   Medication Sig Start Date End Date Taking? Authorizing Provider  Empagliflozin-metFORMIN HCl ER (SYNJARDY XR) 03-999 MG TB24 Take 1 tablet by mouth daily. 08/31/19  Yes Jerrol Banana., MD  propranolol (INDERAL) 20 MG tablet Take 20 mg by mouth 2 (two) times daily.  Yes [provider]  spironolactone (ALDACTONE) 100 MG tablet Take 1 tablet (100 mg total) by mouth 2 (two) times daily. Take 50 mg BID for 1 week and then increase to 100 mg BID 03/21/19  Yes Jerrol Banana., MD  tamsulosin [facility]) 0.4 MG CAPS capsule Take 1 capsule (0.4 mg total) by mouth daily. 05/11/18  Yes Jerrol Banana., MD  albuterol (VENTOLIN HFA) 108 [phone] Base) MCG/ACT inhaler Inhale 2 puffs into the lungs every 6 (six) hours as needed for wheezing or  shortness of breath. 10/18/19   Jerrol Banana., MD  blood glucose meter kit and supplies Dispense based on patient and insurance preference. Use once daily as directed. (FOR ICD-10 E11.69). 01/03/20   Jerrol Banana., MD  furosemide (LASIX) 20 MG tablet Take 40 mg by mouth 2 (two) times daily. 11/26/19   [provider]  ondansetron (ZOFRAN ODT) 4 MG disintegrating tablet Take 1 tablet (4 mg total) by mouth every 8 (eight) hours as needed for nausea or vomiting. Patient not taking: Reported on 01/22/2020 11/20/18   Nena Polio, MD  promethazine (PHENERGAN) 25 MG tablet Take 1 tablet (25 mg total) by mouth every 8 (eight) hours as needed for nausea or vomiting. 12/25/19   Jerrol Banana., MD  traMADol Veatrice Bourbon) 50 MG tablet Take 1 to 2 tablets every 8 hours and as needed for pain Patient not taking: Reported on 01/22/2020 10/19/18   Jerrol Banana., MD  ursodiol (ACTIGALL) 300 MG capsule Take 300 mg by mouth 2 (two) times daily. 12/18/19   [provider]    Physical Exam: Vitals:   01/24/20 1612 01/24/20 1640 01/24/20 1714 01/24/20 1753  BP: (!) 122/50 (!) 107/47  (!) 114/54  Pulse: 70 73  72  Resp: 18 20  16   Temp:    97.7 F (36.5 C)  TempSrc:    Oral  SpO2: 98%  95% 100%  Weight:      Height:          Constitutional: Acutely ill-looking, shivering, in obvious distress Vitals:   01/24/20 1612 01/24/20 1640 01/24/20 1714 01/24/20 1753  BP: (!) 122/50 (!) 107/47  (!) 114/54  Pulse: 70 73  72  Resp: 18 20  16   Temp:    97.7 F (36.5 C)  TempSrc:    Oral  SpO2: 98%  95% 100%  Weight:      Height:       Eyes: PERRL, lids and conjunctivae normal ENMT: Mucous membranes are moist. Posterior pharynx clear of any exudate or lesions.Normal dentition.  Neck: normal, supple, no masses, no thyromegaly Respiratory: clear to auscultation bilaterally, no wheezing, no crackles. Normal respiratory effort. No accessory muscle use.  Cardiovascular: Sinus  tachycardia, no murmurs / rubs / gallops. No extremity edema. 2+ pedal pulses. No carotid bruits.  Abdomen: no tenderness, no masses palpated. No hepatosplenomegaly. Bowel sounds positive.  Musculoskeletal: no clubbing / cyanosis. No joint deformity upper and lower extremities. Good ROM, no contractures. Normal muscle tone.  Skin:  no rashes, lesions, ulcers. No induration Neurologic: CN 2-12 grossly intact. Strength 5/5 in all 4.  Psychiatric: Normal judgment and insight. Alert and oriented x 3.      Labs on Admission: I have personally reviewed following labs and imaging studies  CBC: Recent Labs  Lab 01/22/20 1544 01/23/20 0244 01/24/20 0701 01/24/20 1503  WBC 7.3 13.8* 7.5  --   NEUTROABS 6.3  --  5.7  --   HGB 8.8* 8.5* 7.7* 8.5*  HCT 24.9* 25.4* 22.7* 24.7*  MCV 96.5 101.6* 99.6  --   PLT 45* 39* 34*  --    Basic Metabolic Panel: Recent Labs  Lab 01/22/20 1544 01/23/20 0244 01/24/20 0701  NA 133* 134* 134*  K 3.5 3.9 3.6  CL 101 104 105  CO2 22 21* 21*  GLUCOSE 307* 265* 195*  BUN 22* 27* 36*  CREATININE 1.56* 1.77* 1.70*  CALCIUM 7.9* 7.5* 7.8*  MG  --   --  1.7   GFR: Estimated Creatinine Clearance: 67.1 mL/min (A) (by C-G formula based on SCr of 1.7 mg/dL (H)). Liver Function Tests: Recent Labs  Lab 01/22/20 1544 01/23/20 0244 01/24/20 0701  AST 39 37 33  ALT 23 22 19   ALKPHOS 118 78 61  BILITOT 3.4* 3.0* 2.0*  PROT 5.9* 5.6* 5.4*  ALBUMIN 2.4* 2.3* 2.1*   No results for input(s): LIPASE, AMYLASE in the last 168 hours. No results for input(s): AMMONIA in the last 168 hours. Coagulation Profile: Recent Labs  Lab 01/22/20 1544 01/23/20 0244  INR 1.6* 1.8*   Cardiac Enzymes: No results for input(s): CKTOTAL, CKMB, CKMBINDEX, TROPONINI in the last 168 hours. BNP (last 3 results) Recent Labs    03/07/19 1520  PROBNP 290*   HbA1C: No results for input(s): HGBA1C in the last 72 hours. CBG: Recent Labs  Lab 01/23/20 1645 01/23/20 2155  01/24/20 0712 01/24/20 1320 01/24/20 1755  GLUCAP 212* 214* 176* 199* 187*   Lipid Profile: No results for input(s): CHOL, HDL, LDLCALC, TRIG, CHOLHDL, LDLDIRECT in the last 72 hours. Thyroid Function Tests: No results for input(s): TSH, T4TOTAL, FREET4, T3FREE, THYROIDAB in the last 72 hours. Anemia Panel: No results for input(s): VITAMINB12, FOLATE, FERRITIN, TIBC, IRON, RETICCTPCT in the last 72 hours. Urine analysis:    Component Value Date/Time   COLORURINE AMBER (A) 01/22/2020 1805   APPEARANCEUR CLOUDY (A) 01/22/2020 1805   APPEARANCEUR Clear 06/11/2013 0902   LABSPEC 1.018 01/22/2020 1805   LABSPEC 1.031 06/11/2013 0902   PHURINE 5.0 01/22/2020 1805   GLUCOSEU >=500 (A) 01/22/2020 1805   GLUCOSEU >=500 06/11/2013 0902   HGBUR MODERATE (A) 01/22/2020 1805   BILIRUBINUR NEGATIVE 01/22/2020 1805   BILIRUBINUR negative 03/21/2019 1459   BILIRUBINUR Negative 06/11/2013 0902   KETONESUR NEGATIVE 01/22/2020 1805   PROTEINUR NEGATIVE 01/22/2020 1805   UROBILINOGEN 0.2 03/21/2019 1459   NITRITE NEGATIVE 01/22/2020 1805   LEUKOCYTESUR LARGE (A) 01/22/2020 1805   LEUKOCYTESUR 1+ 06/11/2013 0902    Recent Results (from the past 240 hour(s))  Blood Culture (routine x 2)     Status: None (Preliminary result)   Collection Time: 01/22/20  3:44 PM   Specimen: BLOOD  Result Value Ref Range Status   Specimen Description BLOOD  Final   Special Requests NONE  Final   Culture   Final    NO GROWTH 2 DAYS Performed at [facility], [address], [place], [place] [address]    Report Status PENDING  Incomplete  Blood Culture (routine x 2)     Status: None (Preliminary result)   Collection Time: 01/22/20  3:44 PM   Specimen: BLOOD  Result Value Ref Range Status   Specimen Description BLOOD  Final   Special Requests NONE  Final   Culture   Final    NO GROWTH 2 DAYS Performed at [facility], [address] [place], [place] [address]    Report  Status PENDING   Incomplete  SARS Coronavirus 2 by RT PCR (hospital order, performed in [facility] hospital lab) Nasopharyngeal Nasopharyngeal Swab     Status: None   Collection Time: 01/22/20  3:44 PM   Specimen: Nasopharyngeal Swab  Result Value Ref Range Status   SARS Coronavirus 2 NEGATIVE NEGATIVE Final    Comment: (NOTE) SARS-CoV-2 target nucleic acids are NOT DETECTED.  The SARS-CoV-2 RNA is generally detectable in upper and lower respiratory specimens during the acute phase of infection. The lowest concentration of SARS-CoV-2 viral copies this assay can detect is 250 copies / mL. A negative result does not preclude SARS-CoV-2 infection and should not be used as the sole basis for treatment or other patient management decisions.  A negative result may occur with improper specimen collection / handling, submission of specimen other than nasopharyngeal swab, presence of viral mutation(s) within the areas targeted by this assay, and inadequate number of viral copies (<250 copies / mL). A negative result must be combined with clinical observations, patient history, and epidemiological information.  Fact Sheet for Patients:   StrictlyIdeas.no  Fact Sheet for Healthcare Providers: BankingDealers.co.za  This test is not yet approved or  cleared by the [place] FDA and has been authorized for detection and/or diagnosis of SARS-CoV-2 by FDA under an Emergency Use Authorization (EUA).  This EUA will remain in effect (meaning this test can be used) for the duration of the COVID-19 declaration under Section 564(b)(1) of the Act, 21 U.S.C. section 360bbb-3(b)(1), unless the authorization is terminated or revoked sooner.  Performed at [facility], [address], [place], [place] [address]   Urine culture     Status: Abnormal   Collection Time: 01/22/20  6:06 PM   Specimen: In/Out Cath Urine  Result Value Ref Range Status   Specimen  Description   Final    IN/OUT CATH URINE Performed at [facility], [address], [place], [place] [address]    Special Requests   Final    NONE Performed at [facility], [address] [place], [place] [address]    Culture MULTIPLE SPECIES PRESENT, SUGGEST RECOLLECTION (A)  Final   Report Status 01/23/2020 FINAL  Final     Radiological Exams on Admission: No results found.  EKG: Independently reviewed.  Sinus tachycardia no acute findings  Assessment/Plan Principal Problem:   Sepsis ([place]) Active Problems:   Diabetes mellitus type 2 in obese ([place])   HLD (hyperlipidemia)   ARF (acute renal failure) ([facility])   Thrombocytopenia ([facility])    #1 sepsis: Secondary to UTI.  Patient will be admitted to stepdown unit.  IV fluids following the sepsis protocol.  He received boluses with continuous IV fluids.  IV cefepime.  Cultures obtained both blood and urine and will follow results, negative to date but I suspect pt has uti related sepsis.He is responding well to iv abx clinically. Anticipate d/c tomorrow.   #2 diabetes: Patient will have sliding scale insulin with Accu-Cheks.  Continue home regimen  #3 acute kidney injury: Most likely prerenal.  Hydrate and monitor renal function  #4 thrombocytopenia: Avoid heparin products.  Platelets very low.  Probably secondary to liver cirrhosis  #5 NASH cirrhosis: Patient will be monitored for bleed.   DVT prophylaxis: SCD Code Status: Full code Family Communication: Wife at bedside Disposition Plan: To be determined Consults called: None Admission status: Inpatient  Severity of Illness: The appropriate patient status for this patient is INPATIENT. Inpatient status is judged to  be reasonable and necessary in order to provide the required intensity of service to ensure the patient's safety. The patient's presenting symptoms, physical exam findings, and initial radiographic and laboratory data in the context of their  chronic comorbidities is felt to place them at high risk for further clinical deterioration. Furthermore, it is not anticipated that the patient will be medically stable for discharge from the hospital within 2 midnights of admission. The following factors support the patient status of inpatient.   " The patient's presenting symptoms include fever and chills. " The worrisome physical exam findings include mild hypotension. " The initial radiographic and laboratory data are worrisome because of evidence of sepsis. " The chronic co-morbidities include diabetes.    I certify that at the point of admission it is my clinical judgment that the patient will require inpatient hospital care spanning beyond 2 midnights from the point of admission due to high intensity of service, high risk for further deterioration and high frequency of surveillance required.  Para Skeans MD [facility] Pager [phone] If 7PM-7AM, please contact night-coverage [email or web address] Password [email or web address]  01/24/2020, 7:11 PM

## 2020-01-24 NOTE — ED Notes (Signed)
ED TO INPATIENT HANDOFF REPORT  ED Nurse Name and Phone #: [phone]  S Name/Age/Gender Jesse Zimmerman 59 y.o. male Room/Bed: ED26A/ED26A  Code Status   Code Status: Full Code  Home/SNF/Other Home Patient oriented to: self, place, time and situation Is this baseline? Yes   Triage Complete: Triage complete  Chief Complaint Sepsis [facility]) [A41.9]  Triage Note Pt here with a fever that started this morning. PT has hx of DM, htn, and Meinier's disease. Pt NAD on arrival to ED.    Allergies Allergies  Allergen Reactions  . Latex     LATEX TAPE  . Pineapple Hives    Level of Care/Admitting Diagnosis ED Disposition    ED Disposition Condition [facility]: [place] [[id number]]  Level of Care: Stepdown [14]  Covid Evaluation: Asymptomatic Screening Protocol (No Symptoms)  Diagnosis: Sepsis ([place]) [[id number]]  Admitting Physician: Elwyn Reach [[id number]]  Attending Physician: Elwyn Reach [[id number]]  Estimated length of stay: past midnight tomorrow  Certification:: I certify this patient will need inpatient services for at least 2 midnights       B Medical/Surgery History Past Medical History:  Diagnosis Date  . Chronic back pain   . 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 Diabetes mellitus without complication ([place])   . Esophageal varices ([place])   . Hyperlipidemia   . NASH (nonalcoholic steatohepatitis)    Past Surgical History:  Procedure Laterality Date  . COLONOSCOPY WITH PROPOFOL N/A 08/12/2017   Procedure: COLONOSCOPY WITH PROPOFOL;  Surgeon: Jonathon Bellows, MD;  Location: [facility] ENDOSCOPY;  Service: Gastroenterology;  Laterality: N/A;  . ESOPHAGOGASTRODUODENOSCOPY (EGD) WITH PROPOFOL N/A 04/18/2017   Procedure: ESOPHAGOGASTRODUODENOSCOPY (EGD) WITH PROPOFOL;  Surgeon: [name], MD;  Location: [facility] ENDOSCOPY;  Service: Endoscopy;  Laterality: N/A;  . ESOPHAGOGASTRODUODENOSCOPY (EGD) WITH PROPOFOL N/A 07/18/2018   Procedure:  ESOPHAGOGASTRODUODENOSCOPY (EGD) WITH PROPOFOL;  Surgeon: [name], MD;  Location[facility] ENDOSCOPY;  Service: Endoscopy;  Laterality: N/A;  . EXCESSIVE THIGH / HIP / BUTTOCK / FLANK SKIN EXCISION     PART OF INNER THIGH/DUE TO SEPSIS INFECTION REMOVED  . KIDNEY STONE SURGERY     removed  . TONSILLECTOMY AND ADENOIDECTOMY       A IV Location/Drains/Wounds Patient Lines/Drains/Airways Status    Active Line/Drains/Airways    Name Placement date Placement time Site Days   Peripheral IV 03/18/19 Right;Anterior Forearm 03/18/19  2059  Forearm  312   Peripheral IV 01/22/20 Anterior;Distal;Right Forearm 01/22/20  1546  Forearm  2          Intake/Output Last 24 hours  Intake/Output Summary (Last 24 hours) at 01/24/2020 1637 Last data filed at 01/24/2020 0125 Gross per 24 hour  Intake 100 ml  Output --  Net 100 ml    Labs/Imaging Results for orders placed or performed during the hospital encounter of 01/22/20 (from the past 48 hour(s))  Urinalysis, Complete w Microscopic     Status: Abnormal   Collection Time: 01/22/20  6:05 PM  Result Value Ref Range   Color, Urine AMBER (A) YELLOW    Comment: BIOCHEMICALS MAY BE AFFECTED BY COLOR   APPearance CLOUDY (A) CLEAR   Specific Gravity, Urine 1.018 1.005 - 1.030   pH 5.0 5.0 - 8.0   Glucose, UA >=500 (A) NEGATIVE mg/dL   Hgb urine dipstick MODERATE (A) NEGATIVE   Bilirubin Urine NEGATIVE NEGATIVE   Ketones, ur NEGATIVE NEGATIVE mg/dL   Protein, ur NEGATIVE NEGATIVE mg/dL  Nitrite NEGATIVE NEGATIVE   Leukocytes,Ua LARGE (A) NEGATIVE   RBC / HPF 21-50 0 - 5 RBC/hpf   WBC, UA 21-50 0 - 5 WBC/hpf   Bacteria, UA FEW (A) NONE SEEN   Squamous Epithelial / LPF 6-10 0 - 5   Mucus PRESENT    Hyaline Casts, UA PRESENT     Comment: Performed at [facility], [address] [place], [place] [address]  Lactic acid, plasma     Status: Abnormal   Collection Time: 01/22/20  6:06 PM  Result Value Ref Range   Lactic Acid, Venous 4.1  (HH) 0.5 - 1.9 mmol/L    Comment: CRITICAL VALUE NOTED. VALUE IS CONSISTENT WITH PREVIOUSLY REPORTED/CALLED VALUE SKL Performed at [facility], [address] [place], [place] [address]   Urine culture     Status: Abnormal   Collection Time: 01/22/20  6:06 PM   Specimen: In/Out Cath Urine  Result Value Ref Range   Specimen Description      IN/OUT CATH URINE Performed at [facility], [address] [place], [place] [address]    Special Requests      NONE Performed at [facility] [address] [place], [place] [address]    Culture MULTIPLE SPECIES PRESENT, SUGGEST RECOLLECTION (A)    Report Status 01/23/2020 FINAL   Glucose, capillary     Status: Abnormal   Collection Time: 01/22/20 11:55 PM  Result Value Ref Range   Glucose-Capillary 259 (H) 70 - 99 mg/dL    Comment: Glucose reference range applies only to samples taken after fasting for at least 8 hours.  Lactic acid, plasma     Status: Abnormal   Collection Time: 01/23/20  1:20 AM  Result Value Ref Range   Lactic Acid, Venous 2.8 (HH) 0.5 - 1.9 mmol/L    Comment: CRITICAL VALUE NOTED. VALUE IS CONSISTENT WITH PREVIOUSLY REPORTED/CALLED VALUE SKL Performed at [facility], [address] [place], [place] [address]   HIV Antibody (routine testing w rflx)     Status: None   Collection Time: 01/23/20  2:43 AM  Result Value Ref Range   HIV Screen 4th Generation wRfx Non Reactive Non Reactive    Comment: Performed at [facility], [place] [address] [place], [place] [address]  Protime-INR     Status: Abnormal   Collection Time: 01/23/20  2:44 AM  Result Value Ref Range   Prothrombin Time 19.8 (H) 11.4 - 15.2 seconds   INR 1.8 (H) 0.8 - 1.2    Comment: (NOTE) INR goal varies based on device and disease states. Performed at [facility], [address] [place], [place] [address]   Cortisol-am, blood     Status: Abnormal   Collection Time: 01/23/20  2:44 AM  Result  Value Ref Range   Cortisol - AM 24.9 (H) 6.7 - 22.6 ug/dL    Comment: Performed at [facility] [address] [place],  [address]  Procalcitonin     Status: None   Collection Time: 01/23/20  2:44 AM  Result Value Ref Range   Procalcitonin 19.63 ng/mL    Comment:        Interpretation: PCT >= 10 ng/mL: Important systemic inflammatory response, almost exclusively due to severe bacterial sepsis or septic shock. (NOTE)       Sepsis PCT Algorithm           Lower Respiratory Tract  Infection PCT Algorithm    ----------------------------     ----------------------------         PCT < 0.25 ng/mL                PCT < 0.10 ng/mL          Strongly encourage             Strongly discourage   discontinuation of antibiotics    initiation of antibiotics    ----------------------------     -----------------------------       PCT 0.25 - 0.50 ng/mL            PCT 0.10 - 0.25 ng/mL               OR       >80% decrease in PCT            Discourage initiation of                                            antibiotics      Encourage discontinuation           of antibiotics    ----------------------------     -----------------------------         PCT >= 0.50 ng/mL              PCT 0.26 - 0.50 ng/mL                AND       <80% decrease in PCT             Encourage initiation of                                             antibiotics       Encourage continuation           of antibiotics    ----------------------------     -----------------------------        PCT >= 0.50 ng/mL                  PCT > 0.50 ng/mL               AND         increase in PCT                  Strongly encourage                                      initiation of antibiotics    Strongly encourage escalation           of antibiotics                                     -----------------------------                                           PCT <= 0.25 ng/mL  OR                                        > 80% decrease in PCT                                      Discontinue / Do not initiate                                             antibiotics  Performed at [facility], [address] [place], [place] [address]   Comprehensive metabolic panel     Status: Abnormal   Collection Time: 01/23/20  2:44 AM  Result Value Ref Range   Sodium 134 (L) 135 - 145 mmol/L   Potassium 3.9 3.5 - 5.1 mmol/L   Chloride 104 98 - 111 mmol/L   CO2 21 (L) 22 - 32 mmol/L   Glucose, Bld 265 (H) 70 - 99 mg/dL    Comment: Glucose reference range applies only to samples taken after fasting for at least 8 hours.   BUN 27 (H) 6 - 20 mg/dL   Creatinine, Ser 1.77 (H) 0.61 - 1.24 mg/dL   Calcium 7.5 (L) 8.9 - 10.3 mg/dL   Total Protein 5.6 (L) 6.5 - 8.1 g/dL   Albumin 2.3 (L) 3.5 - 5.0 g/dL   AST 37 15 - 41 U/L   ALT 22 0 - 44 U/L   Alkaline Phosphatase 78 38 - 126 U/L   Total Bilirubin 3.0 (H) 0.3 - 1.2 mg/dL   GFR calc non Af Amer 41 (L) >60 mL/min   GFR calc Af Amer 48 (L) >60 mL/min   Anion gap 9 5 - 15    Comment: Performed at [facility], [address] [place], [place] [address]  CBC     Status: Abnormal   Collection Time: 01/23/20  2:44 AM  Result Value Ref Range   WBC 13.8 (H) 4.0 - 10.5 K/uL   RBC 2.50 (L) 4.22 - 5.81 MIL/uL   Hemoglobin 8.5 (L) 13.0 - 17.0 g/dL   HCT 25.4 (L) 39 - 52 %   MCV 101.6 (H) 80.0 - 100.0 fL   MCH 34.0 26.0 - 34.0 pg   MCHC 33.5 30.0 - 36.0 g/dL   RDW 15.3 11.5 - 15.5 %   Platelets 39 (L) 150 - 400 K/uL    Comment: Immature Platelet Fraction may be clinically indicated, consider ordering this additional test [id number]    nRBC 0.0 0.0 - 0.2 %    Comment: Performed at [facility], [address] [place], [place] [address]  Glucose, capillary     Status: Abnormal   Collection Time: 01/23/20  7:56 AM  Result Value Ref Range   Glucose-Capillary 226 (H) 70 -  99 mg/dL    Comment: Glucose reference range applies only to samples taken after fasting for at least 8 hours.   Comment 1 Notify RN    Comment 2 Document in Chart   Glucose, capillary     Status: Abnormal   Collection Time: 01/23/20  1:51 PM  Result Value Ref Range   Glucose-Capillary 206 (H) 70 - 99 mg/dL  Comment: Glucose reference range applies only to samples taken after fasting for at least 8 hours.   Comment 1 Notify RN    Comment 2 Document in Chart   Glucose, capillary     Status: Abnormal   Collection Time: 01/23/20  4:45 PM  Result Value Ref Range   Glucose-Capillary 212 (H) 70 - 99 mg/dL    Comment: Glucose reference range applies only to samples taken after fasting for at least 8 hours.   Comment 1 Notify RN    Comment 2 Document in Chart   Glucose, capillary     Status: Abnormal   Collection Time: 01/23/20  9:55 PM  Result Value Ref Range   Glucose-Capillary 214 (H) 70 - 99 mg/dL    Comment: Glucose reference range applies only to samples taken after fasting for at least 8 hours.  CBC with Differential/Platelet     Status: Abnormal   Collection Time: 01/24/20  7:01 AM  Result Value Ref Range   WBC 7.5 4.0 - 10.5 K/uL   RBC 2.28 (L) 4.22 - 5.81 MIL/uL   Hemoglobin 7.7 (L) 13.0 - 17.0 g/dL   HCT 22.7 (L) 39 - 52 %   MCV 99.6 80.0 - 100.0 fL   MCH 33.8 26.0 - 34.0 pg   MCHC 33.9 30.0 - 36.0 g/dL   RDW 15.7 (H) 11.5 - 15.5 %   Platelets 34 (L) 150 - 400 K/uL    Comment: Immature Platelet Fraction may be clinically indicated, consider ordering this additional test [id number]    nRBC 0.0 0.0 - 0.2 %   Neutrophils Relative % 77 %   Neutro Abs 5.7 1.7 - 7.7 K/uL   Lymphocytes Relative 10 %   Lymphs Abs 0.8 0.7 - 4.0 K/uL   Monocytes Relative 8 %   Monocytes Absolute 0.6 0 - 1 K/uL   Eosinophils Relative 4 %   Eosinophils Absolute 0.3 0 - 0 K/uL   Basophils Relative 0 %   Basophils Absolute 0.0 0 - 0 K/uL   Immature Granulocytes 1 %   Abs Immature Granulocytes  0.06 0.00 - 0.07 K/uL    Comment: Performed at [facility], [address] [place], [place] [address]  Comprehensive metabolic panel     Status: Abnormal   Collection Time: 01/24/20  7:01 AM  Result Value Ref Range   Sodium 134 (L) 135 - 145 mmol/L   Potassium 3.6 3.5 - 5.1 mmol/L   Chloride 105 98 - 111 mmol/L   CO2 21 (L) 22 - 32 mmol/L   Glucose, Bld 195 (H) 70 - 99 mg/dL    Comment: Glucose reference range applies only to samples taken after fasting for at least 8 hours.   BUN 36 (H) 6 - 20 mg/dL   Creatinine, Ser 1.70 (H) 0.61 - 1.24 mg/dL   Calcium 7.8 (L) 8.9 - 10.3 mg/dL   Total Protein 5.4 (L) 6.5 - 8.1 g/dL   Albumin 2.1 (L) 3.5 - 5.0 g/dL   AST 33 15 - 41 U/L   ALT 19 0 - 44 U/L   Alkaline Phosphatase 61 38 - 126 U/L   Total Bilirubin 2.0 (H) 0.3 - 1.2 mg/dL   GFR calc non Af Amer 43 (L) >60 mL/min   GFR calc Af Amer 50 (L) >60 mL/min   Anion gap 8 5 - 15    Comment: Performed at [facility], [address] [place], [place] [address]  Magnesium  Status: None   Collection Time: 01/24/20  7:01 AM  Result Value Ref Range   Magnesium 1.7 1.7 - 2.4 mg/dL    Comment: Performed at [facility], [address] [place], [place] [address]  Glucose, capillary     Status: Abnormal   Collection Time: 01/24/20  7:12 AM  Result Value Ref Range   Glucose-Capillary 176 (H) 70 - 99 mg/dL    Comment: Glucose reference range applies only to samples taken after fasting for at least 8 hours.  Prepare RBC (crossmatch)     Status: None   Collection Time: 01/24/20  9:00 AM  Result Value Ref Range   Order Confirmation      ORDER PROCESSED BY BLOOD BANK Performed at [facility], [address] [place], [place] [address]   Type and screen [place]     Status: None (Preliminary result)   Collection Time: 01/24/20  9:04 AM  Result Value Ref Range   ABO/RH(D) O POS    Antibody Screen NEG    Sample Expiration  01/27/2020,2359    Unit Number [id number]    Blood Component Type RED CELLS,LR    Unit division 00    Status of Unit ISSUED    Transfusion Status OK TO TRANSFUSE    Crossmatch Result      Compatible Performed at [facility], [address] [place], [place] [address]   Glucose, capillary     Status: Abnormal   Collection Time: 01/24/20  1:20 PM  Result Value Ref Range   Glucose-Capillary 199 (H) 70 - 99 mg/dL    Comment: Glucose reference range applies only to samples taken after fasting for at least 8 hours.  Hemoglobin and hematocrit, blood     Status: Abnormal   Collection Time: 01/24/20  3:03 PM  Result Value Ref Range   Hemoglobin 8.5 (L) 13.0 - 17.0 g/dL   HCT 24.7 (L) 39 - 52 %    Comment: Performed at [facility], [address] [place], [place] [address]   No results found.  Pending Labs [employer] (From admission, onward) Comment          Start     Ordered   01/24/20 0840  Urine Culture  Once,   STAT        01/24/20 0839          Vitals/Pain Today's Vitals   01/24/20 1020 01/24/20 1140 01/24/20 1148 01/24/20 1148  BP: 110/67 (!) 105/92 114/67 114/67  Pulse: 77 76 76 75  Resp:   12 16  Temp:    98.5 F (36.9 C)  TempSrc:    Oral  SpO2: 100% 100% 99% 97%  Weight:      Height:      PainSc:        Isolation Precautions No active isolations  Medications Medications  insulin aspart (novoLOG) injection 0-15 Units (3 Units Subcutaneous Given 01/24/20 1340)  insulin aspart (novoLOG) injection 0-5 Units (2 Units Subcutaneous Given 01/23/20 2215)  lactated ringers infusion ( Intravenous New Bag/Given 01/24/20 1505)  acetaminophen (TYLENOL) tablet 650 mg (has no administration in time range)    Or  acetaminophen (TYLENOL) suppository 650 mg (has no administration in time range)  ondansetron (ZOFRAN) tablet 4 mg (has no administration in time range)    Or  ondansetron (ZOFRAN) injection 4 mg (has no administration in time range)   ceFEPIme (MAXIPIME) 2 g in sodium chloride 0.9 % 100 mL IVPB (  0 g Intravenous Stopped 01/24/20 1542)  0.9 %  sodium chloride infusion (Manually program via Guardrails IV Fluids) (0 mLs Intravenous Hold 01/24/20 1151)  sodium chloride 0.9 % bolus 1,000 mL (0 mLs Intravenous Stopped 01/22/20 1650)  ceFEPIme (MAXIPIME) 2 g in sodium chloride 0.9 % 100 mL IVPB (0 g Intravenous Stopped 01/22/20 1642)  metroNIDAZOLE (FLAGYL) IVPB 500 mg (0 mg Intravenous Stopped 01/22/20 1910)  vancomycin (VANCOCIN) IVPB 1000 mg/200 mL premix (0 mg Intravenous Stopped 01/22/20 1956)  sodium chloride 0.9 % bolus 2,200 mL (0 mLs Intravenous Stopped 01/22/20 1910)  ondansetron (ZOFRAN) injection 4 mg (4 mg Intravenous Given 01/22/20 1837)  ceFEPIme (MAXIPIME) 2 g in sodium chloride 0.9 % 100 mL IVPB (0 g Intravenous Stopped 01/23/20 0043)  sodium chloride 0.9 % bolus 1,000 mL (0 mLs Intravenous Stopped 01/22/20 2358)    Mobility walks Low fall risk   Focused Assessments    R Recommendations: See Admitting Provider Note  Report given to:

## 2020-01-24 NOTE — Progress Notes (Signed)
Inpatient Diabetes Program Recommendations  AACE/ADA: New Consensus Statement on Inpatient Glycemic Control   Target Ranges:  Prepandial:   less than 140 mg/dL      Peak postprandial:   less than 180 mg/dL (1-2 hours)      Critically ill patients:  140 - 180 mg/dL  Results for SHIVAAN, TIERNO" (MRN [id number]) as of 01/24/2020 11:04  Ref. Range 01/23/2020 07:56 01/23/2020 13:51 01/23/2020 16:45 01/23/2020 21:55 01/24/2020 07:12  Glucose-Capillary Latest Ref Range: 70 - 99 mg/dL 226 (H) 206 (H) 212 (H) 214 (H) 176 (H)    Review of Glycemic Control  Diabetes history: DM2 Outpatient Diabetes medications: Synjardy XR 03-999 mg daily Current orders for Inpatient glycemic control: Novolog 0-15 units TID with meals, Novolog 0-5 units QHS  Inpatient Diabetes Program Recommendations:    Insulin-Basal: Please consider ordering Levemir 5 units Q24H.  Insulin-Meal Coverage: If patient is eating at least 50% of meals, please consider ordering Novolog 3 units TID with meals for meal coverage.  Thanks, Barnie Alderman, RN, MSN, CDE Diabetes Coordinator Inpatient Diabetes Program [phone] (Team Pager from 8am to 5pm)

## 2020-01-25 LAB — CBC WITH DIFFERENTIAL/PLATELET
Abs Immature Granulocytes: 0.03 10*3/uL (ref 0.00–0.07)
Basophils Absolute: 0 10*3/uL (ref 0.0–0.1)
Basophils Relative: 1 %
Eosinophils Absolute: 0.3 10*3/uL (ref 0.0–0.5)
Eosinophils Relative: 7 %
HCT: 24.9 % — ABNORMAL LOW (ref 39.0–52.0)
Hemoglobin: 8.8 g/dL — ABNORMAL LOW (ref 13.0–17.0)
Immature Granulocytes: 1 %
Lymphocytes Relative: 11 %
Lymphs Abs: 0.5 10*3/uL — ABNORMAL LOW (ref 0.7–4.0)
MCH: 34.1 pg — ABNORMAL HIGH (ref 26.0–34.0)
MCHC: 35.3 g/dL (ref 30.0–36.0)
MCV: 96.5 fL (ref 80.0–100.0)
Monocytes Absolute: 0.4 10*3/uL (ref 0.1–1.0)
Monocytes Relative: 8 %
Neutro Abs: 3.5 10*3/uL (ref 1.7–7.7)
Neutrophils Relative %: 72 %
Platelets: 32 10*3/uL — ABNORMAL LOW (ref 150–400)
RBC: 2.58 MIL/uL — ABNORMAL LOW (ref 4.22–5.81)
RDW: 16.2 % — ABNORMAL HIGH (ref 11.5–15.5)
WBC: 4.8 10*3/uL (ref 4.0–10.5)
nRBC: 0 % (ref 0.0–0.2)

## 2020-01-25 LAB — TYPE AND SCREEN
ABO/RH(D): O POS
Antibody Screen: NEGATIVE
Unit division: 0

## 2020-01-25 LAB — COMPREHENSIVE METABOLIC PANEL
ALT: 19 U/L (ref 0–44)
AST: 33 U/L (ref 15–41)
Albumin: 2.1 g/dL — ABNORMAL LOW (ref 3.5–5.0)
Alkaline Phosphatase: 73 U/L (ref 38–126)
Anion gap: 5 (ref 5–15)
BUN: 33 mg/dL — ABNORMAL HIGH (ref 6–20)
CO2: 24 mmol/L (ref 22–32)
Calcium: 7.6 mg/dL — ABNORMAL LOW (ref 8.9–10.3)
Chloride: 105 mmol/L (ref 98–111)
Creatinine, Ser: 1.42 mg/dL — ABNORMAL HIGH (ref 0.61–1.24)
GFR calc Af Amer: >60 mL/min (ref >60)
GFR calc non Af Amer: 54 mL/min — ABNORMAL LOW (ref >60)
Glucose, Bld: 269 mg/dL — ABNORMAL HIGH (ref 70–99)
Potassium: 4 mmol/L (ref 3.5–5.1)
Sodium: 134 mmol/L — ABNORMAL LOW (ref 135–145)
Total Bilirubin: 2 mg/dL — ABNORMAL HIGH (ref 0.3–1.2)
Total Protein: 5.4 g/dL — ABNORMAL LOW (ref 6.5–8.1)

## 2020-01-25 LAB — BPAM RBC
Blood Product Expiration Date: 202109042359
ISSUE DATE / TIME: 202108051133
Unit Type and Rh: 5100

## 2020-01-25 LAB — GLUCOSE, CAPILLARY
Glucose-Capillary: 174 mg/dL — ABNORMAL HIGH (ref 70–99)
Glucose-Capillary: 213 mg/dL — ABNORMAL HIGH (ref 70–99)

## 2020-01-25 LAB — MAGNESIUM: Magnesium: 1.8 mg/dL (ref 1.7–2.4)

## 2020-01-25 MED ORDER — EMPAGLIFLOZIN 10 MG PO TABS
10.0000 mg | ORAL_TABLET | Freq: Every day | ORAL | 0 refills | Status: AC
Start: 2020-01-25 — End: 2020-02-24

## 2020-01-25 MED ORDER — FUROSEMIDE 20 MG PO TABS: 40.0000 mg | ORAL_TABLET | Freq: Every day | ORAL | 0 refills | Status: DC

## 2020-01-25 MED ORDER — CEFDINIR 300 MG PO CAPS
300.0000 mg | ORAL_CAPSULE | Freq: Two times a day (BID) | ORAL | 0 refills | Status: AC
Start: 2020-01-25 — End: 2020-01-30

## 2020-01-25 NOTE — Discharge Summary (Signed)
Physician Discharge Summary  Jesse Zimmerman MWU:[id number] DOB: 10-Dec-1960 DOA: 01/22/2020  PCP: Jerrol Banana., MD  Admit date: 01/22/2020 Discharge date: 01/25/2020  Time spent: 30 minutes  Recommendations for Outpatient Follow-up:  1. PCP in one week to f/u and also to repeat bmp  To restart synjardy.   Discharge Diagnoses:  Principal Problem:   Sepsis ([place]) Active Problems:   Diabetes mellitus type 2 in obese ([place])   ARF (acute renal failure) (HCC)   Thrombocytopenia ([facility]) Sepsis: Secondary to UTI.  Patient will be admitted to stepdown unit.  IV fluids following the sepsis protocol.  He received boluses with continuous IV fluids.  IV cefepime.  Cultures obtained both blood and urine and will follow results, negative to date but I suspect pt has uti related sepsis.He is responding well to iv abx clinically.  Pt d/c home with po cefdinir x 5 days.   DM II: Synjardy held today pt to have repeat bmp and restart talking it once creatinine is below 1.4  AKI : Renally dose meds and avoid over diuresis and contrast studies without creatinine checks.  Thrombocytopenia: Attribute to his h/o cirrhosis due to NASH.   Discharge Condition:  Stable  Diet recommendation:  Carb consistent cardiac diet.    Filed Weights   01/22/20 1528  Weight: 133.8 kg    History of present illness:  HPI: Jesse Zimmerman is a 59 y.o. male with medical history significant of diabetes, Jesse Zimmerman, hyperlipidemia, esophageal varices and chronic back pain who presented to the ER with fever chills and weakness.  Patient had a temperature 103.  Also shivering and confused.  Evaluation showed evidence of sepsis.  Source is urinary tract infection it appears.  Does not have indwelling catheter.  He has been relatively healthy.  Has had previous UTIs but not this bad.  Patient has elevated lactic acid and is hypotensive.  Is being admitted with severe sepsis secondary to urinary tract infection..  ED Course:  Temperature 100.5 blood pressure 97/48 pulse 82 respirate of 19 oxygen sat 97% room air.  Sodium 133 potassium 3.5 chloride 104 CO2 22 glucose 307 BUN is 22 creatinine 1.56 lactic acid 4.0 and 4.1.  White count 7.3 hemoglobin 8.8 and platelet count of 45.  Urinalysis showed cloudy urine with moderate glucose large leukocytes WBC 21-50 and few bacteria.Chest x-ray showed no acute findings.  Patient being admitted with sepsis due to UTI.  Hospital Course:  Pt has been stable with IVF and iv abx and cultures has been negative and u/a and cx was inconclusive and contaminated and repeat is negative. Pt states he agrees with d/c plan and also think it is uti as he has h/o frequent uti.   Procedures: None Consultations:  None  Discharge Exam: Vitals:   01/25/20 0624 01/25/20 0746  BP: 129/62 (!) 142/68  Pulse: 66 70  Resp: 18 15  Temp: 97.7 F (36.5 C) 97.8 F (36.6 C)  SpO2: 98% 98%    Physical Exam Vitals reviewed.  Constitutional:      Appearance: He is obese.  HENT:     Head: Normocephalic and atraumatic.     Right Ear: External ear normal.     Left Ear: External ear normal.     Mouth/Throat:     Mouth: Mucous membranes are moist.     Pharynx: Oropharynx is clear.  Eyes:     Extraocular Movements: Extraocular movements intact.  Cardiovascular:     Rate and Rhythm: Normal rate  and regular rhythm.     Pulses: Normal pulses.     Heart sounds: Normal heart sounds.  Pulmonary:     Effort: Pulmonary effort is normal.  Abdominal:     General: Abdomen is flat. Bowel sounds are normal.     Palpations: Abdomen is soft.  Skin:    General: Skin is warm.  Neurological:     General: No focal deficit present.     Mental Status: He is alert and oriented to person, place, and time.  Psychiatric:        Mood and Affect: Mood normal.    Discharge Instructions Discharge Instructions    Call MD for:  difficulty breathing, headache or visual disturbances   Complete by: As directed     Call MD for:  extreme fatigue   Complete by: As directed    Call MD for:  persistant dizziness or light-headedness   Complete by: As directed    Call MD for:  persistant nausea and vomiting   Complete by: As directed    Call MD for:  severe uncontrolled pain   Complete by: As directed    Diet - low sodium heart healthy   Complete by: As directed    Diet Carb Modified   Complete by: As directed    Discharge instructions   Complete by: As directed    Please note your diabetes medication has been held due to kidney function, and pcp needs to repeat bmp and restart. Please note you are also on 5 additional days of po abx therapy.   Increase activity slowly   Complete by: As directed      Allergies as of 01/25/2020      Reactions   Latex    LATEX TAPE   Pineapple Hives      Medication List    STOP taking these medications   Synjardy XR 03-999 MG Tb24 Generic drug: Empagliflozin-metFORMIN HCl ER     TAKE these medications   albuterol 108 (90 Base) MCG/ACT inhaler Commonly known as: VENTOLIN HFA Inhale 2 puffs into the lungs every 6 (six) hours as needed for wheezing or shortness of breath. Notes to patient: Not given during hospital stay    blood glucose meter kit and supplies Dispense based on patient and insurance preference. Use once daily as directed. (FOR ICD-10 E11.69). Notes to patient: Not given during hospital stay    cefdinir 300 MG capsule Commonly known as: OMNICEF Take 1 capsule (300 mg total) by mouth 2 (two) times daily for 5 days.   furosemide 20 MG tablet Commonly known as: LASIX Take 2 tablets (40 mg total) by mouth daily. What changed: when to take this   ondansetron 4 MG disintegrating tablet Commonly known as: Zofran ODT Take 1 tablet (4 mg total) by mouth every 8 (eight) hours as needed for nausea or vomiting.   promethazine 25 MG tablet Commonly known as: PHENERGAN Take 1 tablet (25 mg total) by mouth every 8 (eight) hours as needed for  nausea or vomiting.   propranolol 20 MG tablet Commonly known as: INDERAL Take 20 mg by mouth 2 (two) times daily.   spironolactone 100 MG tablet Commonly known as: ALDACTONE Take 1 tablet (100 mg total) by mouth 2 (two) times daily. Take 50 mg BID for 1 week and then increase to 100 mg BID   tamsulosin 0.4 MG Caps capsule Commonly known as: FLOMAX Take 1 capsule (0.4 mg total) by mouth daily.   traMADol 50 MG  tablet Commonly known as: ULTRAM Take 1 to 2 tablets every 8 hours and as needed for pain   ursodiol 300 MG capsule Commonly known as: ACTIGALL Take 300 mg by mouth 2 (two) times daily.      Allergies  Allergen Reactions  . Latex     LATEX TAPE  . Pineapple Hives    Follow-up Information    Jerrol Banana., MD Follow up in 1 week(s).   Specialty: Family Medicine Contact information: [address] [place] [address] [phone]                The results of significant diagnostics from this hospitalization (including imaging, microbiology, ancillary and laboratory) are listed below for reference.    Significant Diagnostic Studies: DG Chest Port 1 View  Result Date: 01/22/2020 CLINICAL DATA:  Fever.  Questionable sepsis. EXAM: PORTABLE CHEST 1 VIEW COMPARISON:  03/18/2019. FINDINGS: Stable cardiomegaly and pulmonary venous congestion. Low lung volumes with bibasilar atelectasis. Similar findings noted on prior exam. No pleural effusion or pneumothorax. IMPRESSION: 1.  Stable cardiomegaly and pulmonary venous congestion. 2. Low lung volumes with bibasilar atelectasis. Similar findings noted on prior exam. Electronically Signed   By: Marcello Moores  Register   On: 01/22/2020 15:42    Microbiology: Recent Results (from the past 240 hour(s))  Blood Culture (routine x 2)     Status: None (Preliminary result)   Collection Time: 01/22/20  3:44 PM   Specimen: BLOOD  Result Value Ref Range Status   Specimen Description BLOOD  Final   Special  Requests NONE  Final   Culture   Final    NO GROWTH 3 DAYS Performed at [facility], [address] [place], [place] [address]    Report Status PENDING  Incomplete  Blood Culture (routine x 2)     Status: None (Preliminary result)   Collection Time: 01/22/20  3:44 PM   Specimen: BLOOD  Result Value Ref Range Status   Specimen Description BLOOD  Final   Special Requests NONE  Final   Culture   Final    NO GROWTH 3 DAYS Performed at [facility], [address] [place], [place] [address]    Report Status PENDING  Incomplete  SARS Coronavirus 2 by RT PCR (hospital order, performed in [place] hospital lab) Nasopharyngeal Nasopharyngeal Swab     Status: None   Collection Time: 01/22/20  3:44 PM   Specimen: Nasopharyngeal Swab  Result Value Ref Range Status   SARS Coronavirus 2 NEGATIVE NEGATIVE Final    Comment: (NOTE) SARS-CoV-2 target nucleic acids are NOT DETECTED.  The SARS-CoV-2 RNA is generally detectable in upper and lower respiratory specimens during the acute phase of infection. The lowest concentration of SARS-CoV-2 viral copies this assay can detect is 250 copies / mL. A negative result does not preclude SARS-CoV-2 infection and should not be used as the sole basis for treatment or other patient management decisions.  A negative result may occur with improper specimen collection / handling, submission of specimen other than nasopharyngeal swab, presence of viral mutation(s) within the areas targeted by this assay, and inadequate number of viral copies (<250 copies / mL). A negative result must be combined with clinical observations, patient history, and epidemiological information.  Fact Sheet for Patients:   StrictlyIdeas.no  Fact Sheet for Healthcare Providers: BankingDealers.co.za  This test is not yet approved or  cleared by the [place] FDA and has been authorized for detection and/or  diagnosis of SARS-CoV-2 by FDA under an Emergency Use Authorization (EUA).  This EUA will remain in effect (meaning this test can be used) for the duration of the COVID-19 declaration under Section 564(b)(1) of the Act, 21 U.S.C. section 360bbb-3(b)(1), unless the authorization is terminated or revoked sooner.  Performed at [facility], [address] [place], [place] [address]   Urine culture     Status: Abnormal   Collection Time: 01/22/20  6:06 PM   Specimen: In/Out Cath Urine  Result Value Ref Range Status   Specimen Description   Final    IN/OUT CATH URINE Performed at [facility], [address] [place], [place] [address]    Special Requests   Final    NONE Performed at [facility], [address] [place], [place] [address]    Culture MULTIPLE SPECIES PRESENT, SUGGEST RECOLLECTION (A)  Final   Report Status 01/23/2020 FINAL  Final     Labs: Basic Metabolic Panel: Recent Labs  Lab 01/22/20 1544 01/23/20 0244 01/24/20 0701 01/25/20 0941  NA 133* 134* 134* 134*  K 3.5 3.9 3.6 4.0  CL 101 104 105 105  CO2 22 21* 21* 24  GLUCOSE 307* 265* 195* 269*  BUN 22* 27* 36* 33*  CREATININE 1.56* 1.77* 1.70* 1.42*  CALCIUM 7.9* 7.5* 7.8* 7.6*  MG  --   --  1.7 1.8   Liver Function Tests: Recent Labs  Lab 01/22/20 1544 01/23/20 0244 01/24/20 0701 01/25/20 0941  AST 39 37 33 33  ALT 23 22 19 19   ALKPHOS 118 78 61 73  BILITOT 3.4* 3.0* 2.0* 2.0*  PROT 5.9* 5.6* 5.4* 5.4*  ALBUMIN 2.4* 2.3* 2.1* 2.1*   CBC: Recent Labs  Lab 01/22/20 1544 01/23/20 0244 01/24/20 0701 01/24/20 1503 01/25/20 0941  WBC 7.3 13.8* 7.5  --  4.8  NEUTROABS 6.3  --  5.7  --  3.5  HGB 8.8* 8.5* 7.7* 8.5* 8.8*  HCT 24.9* 25.4* 22.7* 24.7* 24.9*  MCV 96.5 101.6* 99.6  --  96.5  PLT 45* 39* 34*  --  32*    ProBNP (last 3 results) Recent Labs    03/07/19 1520  PROBNP 290*    CBG: Recent Labs  Lab 01/24/20 1320 01/24/20 1755 01/24/20 2104  01/25/20 0804 01/25/20 1158  GLUCAP 199* 187* 199* 174* 213*    Signed: Para Skeans MD.  [facility] 01/25/2020, 12:17 PM

## 2020-01-25 NOTE — Progress Notes (Signed)
Inpatient Diabetes Program Recommendations  AACE/ADA: New Consensus Statement on Inpatient Glycemic Control   Target Ranges:  Prepandial:   less than 140 mg/dL      Peak postprandial:   less than 180 mg/dL (1-2 hours)      Critically ill patients:  140 - 180 mg/dL   Results for Jesse Zimmerman, Jesse Zimmerman" (MRN [id number]) as of 01/25/2020 11:51  Ref. Range 01/24/2020 07:12 01/24/2020 13:20 01/24/2020 17:55 01/24/2020 21:04 01/25/2020 08:04  Glucose-Capillary Latest Ref Range: 70 - 99 mg/dL 176 (H) 199 (H) 187 (H) 199 (H) 174 (H)   Review of Glycemic Control  Diabetes history: DM2 Outpatient Diabetes medications: Synjardy XR 03-999 mg daily Current orders for Inpatient glycemic control: Novolog 0-15 units TID with meals, Novolog 0-5 units QHS  Inpatient Diabetes Program Recommendations:    Insulin-Meal Coverage: Please consider ordering Novolog 3 units TID with meals for meal coverage if patient eats at least 50% of meals.   Thanks, Barnie Alderman, RN, MSN, CDE Diabetes Coordinator Inpatient Diabetes Program [phone] (Team Pager from 8am to 5pm)

## 2020-01-25 NOTE — Progress Notes (Signed)
Patient discharged with discharge instruction and where to pick up medications, removed IV from LFA. Tolerated well. Transferred out via w/c to friends personal vehicle without incident.

## 2020-01-25 NOTE — Plan of Care (Signed)

## 2020-01-27 LAB — CULTURE, BLOOD (ROUTINE X 2)
Culture: NO GROWTH
Culture: NO GROWTH

## 2020-01-28 ENCOUNTER — Telehealth: Payer: Self-pay

## 2020-01-28 NOTE — Telephone Encounter (Signed)
Patient was recently discharged from the hospital on 01/25/20.  No TCM completed, patient does not qualify for TCM services due to [employer] coverage.  Per discharge summary patient needs follow up with PCP. HFU scheduled for 01/31/20 @ 11:20 AM. Jesse Zimmerman

## 2020-01-29 NOTE — Progress Notes (Signed)
I,April Miller,acting as a scribe for Wilhemena Durie, MD.,have documented all relevant documentation on the behalf of Wilhemena Durie, MD,as directed by  Wilhemena Durie, MD while in the presence of Wilhemena Durie, MD.   Established patient visit   Patient: Jesse Zimmerman   DOB: 07-02-1960   59 y.o. Male  MRN: [id number] Visit Date: 01/31/2020  Today's healthcare provider: Wilhemena Durie, MD   Chief Complaint  Patient presents with  . Hospitalization Follow-up   Subjective    HPI  Patient is feeling better from UTI.  Does not have a strength back completely but is improving. He is decided he is going to retire January 2022 from [employer]. Follow up Hospitalization  Patient was admitted to [facility] on 01/22/2020 and discharged on 01/25/2020. He was treated for; Sepsis,  Treatment for this included; see notes in chart. Telephone follow up was done on 01/28/2020 He reports good compliance with treatment. He reports this condition is improved.  --------------------------------------------------------------------  Patient states he feels somewhat improved since discharge from hospital.  Past Medical History:  Diagnosis Date  . Chronic back pain   . Cirrhosis ([place])    NASH  . Diabetes mellitus without complication ([place])   . Esophageal varices ([place])   . Hyperlipidemia   . NASH (nonalcoholic steatohepatitis)        Medications: Outpatient Medications Prior to Visit  Medication Sig  . albuterol (VENTOLIN HFA) 108 (90 Base) MCG/ACT inhaler Inhale 2 puffs into the lungs every 6 (six) hours as needed for wheezing or shortness of breath.  . blood glucose meter kit and supplies Dispense based on patient and insurance preference. Use once daily as directed. (FOR ICD-10 E11.69).  Marland Kitchen empagliflozin (JARDIANCE) 10 MG TABS tablet Take 1 tablet (10 mg total) by mouth daily before breakfast.  . promethazine (PHENERGAN) 25 MG tablet Take 1 tablet (25 mg total) by mouth every  8 (eight) hours as needed for nausea or vomiting.  . [DISCONTINUED] furosemide (LASIX) 20 MG tablet Take 2 tablets (40 mg total) by mouth daily.  . [DISCONTINUED] propranolol (INDERAL) 20 MG tablet Take 20 mg by mouth 2 (two) times daily.   . [DISCONTINUED] spironolactone (ALDACTONE) 100 MG tablet Take 1 tablet (100 mg total) by mouth 2 (two) times daily. Take 50 mg BID for 1 week and then increase to 100 mg BID  . ondansetron (ZOFRAN ODT) 4 MG disintegrating tablet Take 1 tablet (4 mg total) by mouth every 8 (eight) hours as needed for nausea or vomiting. (Patient not taking: Reported on 01/22/2020)  . tamsulosin (FLOMAX) 0.4 MG CAPS capsule Take 1 capsule (0.4 mg total) by mouth daily. (Patient not taking: Reported on 01/31/2020)  . traMADol (ULTRAM) 50 MG tablet Take 1 to 2 tablets every 8 hours and as needed for pain (Patient not taking: Reported on 01/22/2020)  . ursodiol (ACTIGALL) 300 MG capsule Take 300 mg by mouth 2 (two) times daily. (Patient not taking: Reported on 01/31/2020)   No facility-administered medications prior to visit.    Review of Systems  Constitutional: Negative for appetite change, chills and fever.  Respiratory: Negative for chest tightness, shortness of breath and wheezing.   Cardiovascular: Negative for chest pain and palpitations.  Gastrointestinal: Negative for abdominal pain, nausea and vomiting.    Last hemoglobin A1c Lab Results  Component Value Date   HGBA1C 9.7 (A) 01/03/2020      Objective    BP 111/65 (BP Location: Right Arm, Patient Position:  Sitting, Cuff Size: Large)   Pulse 62   Temp 98.2 F (36.8 C) (Oral)   Resp 18   Ht 6' (1.829 m)   Wt (!) 316 lb (143.3 kg)   SpO2 (!) 69%   BMI 42.86 kg/m  BP Readings from Last 3 Encounters:  01/31/20 111/65  01/25/20 (!) 142/68  01/03/20 106/73   Wt Readings from Last 3 Encounters:  01/31/20 (!) 316 lb (143.3 kg)  01/22/20 295 lb (133.8 kg)  01/03/20 (!) 301 lb (136.5 kg)      Physical Exam    BP 111/65 (BP Location: Right Arm, Patient Position: Sitting, Cuff Size: Large)   Pulse 62   Temp 98.2 F (36.8 C) (Oral)   Resp 18   Ht 6' (1.829 m)   Wt (!) 316 lb (143.3 kg)   SpO2 (!) 69%   BMI 42.86 kg/m   General Appearance:    Alert, cooperative, no distress, appears stated age  Head:    Normocephalic, without obvious abnormality, atraumatic  Eyes:    PERRL, conjunctiva/corneas clear, EOM's intact, fundi    benign, both eyes       Ears:    Normal TM's and external ear canals, both ears  Nose:   Nares normal, septum midline, mucosa normal, no drainage   or sinus tenderness  Throat:   Lips, mucosa, and tongue normal; teeth and gums normal  Neck:   Supple, symmetrical, trachea midline, no adenopathy;       thyroid:  No enlargement/tenderness/nodules; no carotid   bruit or JVD  Back:     Symmetric, no curvature, ROM normal, no CVA tenderness  Lungs:     Clear to auscultation bilaterally, respirations unlabored  Chest wall:    No tenderness or deformity  Heart:    Regular rate and rhythm, S1 and S2 normal, no murmur, rub   or gallop  Abdomen:     Soft, non-tender, bowel sounds active all four quadrants,    no masses, no organomegaly  Genitalia:    Normal male without lesion, discharge or tenderness  Rectal:    Normal tone, normal prostate, no masses or tenderness;   guaiac negative stool  Extremities:   Extremities normal, atraumatic, no cyanosis or edema  Pulses:   2+ and symmetric all extremities  Skin:   Skin color, texture, turgor normal, no rashes or lesions  Lymph nodes:   Cervical, supraclavicular, and axillary nodes normal  Neurologic:   CNII-XII intact. Normal strength, sensation and reflexes      throughout     No results found for any visits on 01/31/20.  Assessment & Plan     1. Swelling of lower leg  - spironolactone (ALDACTONE) 100 MG tablet; Take 1 tablet (100 mg total) by mouth 2 (two) times daily. Take 50 mg BID for 1 week and then increase to 100  mg BID  Dispense: 60 tablet; Refill: 3 - CBC w/Diff/Platelet - Comprehensive Metabolic Panel (CMET) - furosemide (LASIX) 20 MG tablet; Take 2 tablets (40 mg total) by mouth daily.  Dispense: 30 tablet; Refill: 2  2. Meniere's disease, unspecified laterality  - CBC w/Diff/Platelet - Comprehensive Metabolic Panel (CMET)  3. Chronic kidney disease, unspecified CKD stage  - CBC w/Diff/Platelet - Comprehensive Metabolic Panel (CMET)  4. Urinary tract infection without hematuria, site unspecified Reculture urine off of antibiotics.  Clinically this is resolved. - CULTURE, URINE COMPREHENSIVE  5. Essential hypertension  - propranolol (INDERAL) 20 MG tablet; Take 1 tablet (20 mg  total) by mouth 2 (two) times daily.  Dispense: 60 tablet; Refill: 2   Return in about 2 months (around 04/01/2020).          Clatie Kessen Cranford Mon, MD  [facility] [phone] (phone) [phone] (fax)  [place]

## 2020-01-31 ENCOUNTER — Other Ambulatory Visit: Payer: Self-pay

## 2020-01-31 ENCOUNTER — Encounter: Payer: Self-pay | Admitting: Family Medicine

## 2020-01-31 ENCOUNTER — Ambulatory Visit: Payer: BC Managed Care – PPO | Admitting: Family Medicine

## 2020-01-31 VITALS — BP 111/65 | HR 62 | Temp 98.2°F | Resp 18 | Ht 72.0 in | Wt 316.0 lb

## 2020-01-31 DIAGNOSIS — M7989 Other specified soft tissue disorders: Secondary | ICD-10-CM | POA: Diagnosis not present

## 2020-01-31 DIAGNOSIS — N390 Urinary tract infection, site not specified: Secondary | ICD-10-CM | POA: Diagnosis not present

## 2020-01-31 DIAGNOSIS — H8109 Meniere's disease, unspecified ear: Secondary | ICD-10-CM

## 2020-01-31 DIAGNOSIS — N189 Chronic kidney disease, unspecified: Secondary | ICD-10-CM

## 2020-01-31 DIAGNOSIS — I10 Essential (primary) hypertension: Secondary | ICD-10-CM

## 2020-01-31 MED ORDER — FUROSEMIDE 20 MG PO TABS: 40.0000 mg | ORAL_TABLET | Freq: Every day | ORAL | 2 refills | Status: DC

## 2020-01-31 MED ORDER — SPIRONOLACTONE 100 MG PO TABS: 100.0000 mg | ORAL_TABLET | Freq: Two times a day (BID) | ORAL | 3 refills | Status: DC

## 2020-01-31 MED ORDER — PROPRANOLOL HCL 20 MG PO TABS: 20.0000 mg | ORAL_TABLET | Freq: Two times a day (BID) | ORAL | 2 refills | Status: DC

## 2020-02-04 LAB — CULTURE, URINE COMPREHENSIVE

## 2020-02-13 DIAGNOSIS — Z01818 Encounter for other preprocedural examination: Secondary | ICD-10-CM | POA: Diagnosis not present

## 2020-02-13 DIAGNOSIS — H8109 Meniere's disease, unspecified ear: Secondary | ICD-10-CM | POA: Diagnosis not present

## 2020-02-13 DIAGNOSIS — N189 Chronic kidney disease, unspecified: Secondary | ICD-10-CM | POA: Diagnosis not present

## 2020-02-13 DIAGNOSIS — M7989 Other specified soft tissue disorders: Secondary | ICD-10-CM | POA: Diagnosis not present

## 2020-02-14 ENCOUNTER — Telehealth: Payer: Self-pay

## 2020-02-14 LAB — CBC WITH DIFFERENTIAL/PLATELET
Basophils Absolute: 0.1 10*3/uL (ref 0.0–0.2)
Basos: 2 %
EOS (ABSOLUTE): 0.3 10*3/uL (ref 0.0–0.4)
Eos: 9 %
Hematocrit: 28.2 % — ABNORMAL LOW (ref 37.5–51.0)
Hemoglobin: 9.9 g/dL — ABNORMAL LOW (ref 13.0–17.7)
Immature Grans (Abs): 0 10*3/uL (ref 0.0–0.1)
Immature Granulocytes: 0 %
Lymphocytes Absolute: 0.8 10*3/uL (ref 0.7–3.1)
Lymphs: 21 %
MCH: 33.7 pg — ABNORMAL HIGH (ref 26.6–33.0)
MCHC: 35.1 g/dL (ref 31.5–35.7)
MCV: 96 fL (ref 79–97)
Monocytes Absolute: 0.4 10*3/uL (ref 0.1–0.9)
Monocytes: 11 %
Neutrophils Absolute: 2.2 10*3/uL (ref 1.4–7.0)
Neutrophils: 57 %
Platelets: 41 10*3/uL — CL (ref 150–450)
RBC: 2.94 x10E6/uL — ABNORMAL LOW (ref 4.14–5.80)
RDW: 13.5 % (ref 11.6–15.4)
WBC: 3.8 10*3/uL (ref 3.4–10.8)

## 2020-02-14 LAB — COMPREHENSIVE METABOLIC PANEL
ALT: 22 IU/L (ref 0–44)
AST: 38 IU/L (ref 0–40)
Albumin/Globulin Ratio: 0.8 — ABNORMAL LOW (ref 1.2–2.2)
Albumin: 2.8 g/dL — ABNORMAL LOW (ref 3.8–4.9)
Alkaline Phosphatase: 155 IU/L — ABNORMAL HIGH (ref 48–121)
BUN/Creatinine Ratio: 13 (ref 9–20)
BUN: 17 mg/dL (ref 6–24)
Bilirubin Total: 1.8 mg/dL — ABNORMAL HIGH (ref 0.0–1.2)
CO2: 24 mmol/L (ref 20–29)
Calcium: 8.6 mg/dL — ABNORMAL LOW (ref 8.7–10.2)
Chloride: 106 mmol/L (ref 96–106)
Creatinine, Ser: 1.33 mg/dL — ABNORMAL HIGH (ref 0.76–1.27)
GFR calc Af Amer: 68 mL/min/{1.73_m2} (ref >59)
GFR calc non Af Amer: 59 mL/min/{1.73_m2} — ABNORMAL LOW (ref >59)
Globulin, Total: 3.4 g/dL (ref 1.5–4.5)
Glucose: 225 mg/dL — ABNORMAL HIGH (ref 65–99)
Potassium: 4.5 mmol/L (ref 3.5–5.2)
Sodium: 139 mmol/L (ref 134–144)
Total Protein: 6.2 g/dL (ref 6.0–8.5)

## 2020-02-14 NOTE — Telephone Encounter (Signed)
-----   Message from Jerrol Banana., MD sent at 02/14/2020  1:00 PM EDT ----- Labs stable.

## 2020-02-14 NOTE — Telephone Encounter (Signed)
Patient advised of lab results through mychart and has read the providers comments.

## 2020-02-18 DIAGNOSIS — Z6841 Body Mass Index (BMI) 40.0 and over, adult: Secondary | ICD-10-CM | POA: Diagnosis not present

## 2020-02-18 DIAGNOSIS — I5083 High output heart failure: Secondary | ICD-10-CM | POA: Diagnosis not present

## 2020-02-18 DIAGNOSIS — E119 Type 2 diabetes mellitus without complications: Secondary | ICD-10-CM | POA: Diagnosis not present

## 2020-02-18 DIAGNOSIS — I864 Gastric varices: Secondary | ICD-10-CM | POA: Diagnosis not present

## 2020-02-18 DIAGNOSIS — K7460 Unspecified cirrhosis of liver: Secondary | ICD-10-CM | POA: Diagnosis not present

## 2020-02-18 DIAGNOSIS — Z01810 Encounter for preprocedural cardiovascular examination: Secondary | ICD-10-CM | POA: Diagnosis not present

## 2020-02-18 DIAGNOSIS — E785 Hyperlipidemia, unspecified: Secondary | ICD-10-CM | POA: Diagnosis not present

## 2020-02-18 DIAGNOSIS — K766 Portal hypertension: Secondary | ICD-10-CM | POA: Diagnosis not present

## 2020-02-18 DIAGNOSIS — K7581 Nonalcoholic steatohepatitis (NASH): Secondary | ICD-10-CM | POA: Diagnosis not present

## 2020-02-18 DIAGNOSIS — I110 Hypertensive heart disease with heart failure: Secondary | ICD-10-CM | POA: Diagnosis not present

## 2020-02-18 DIAGNOSIS — I8500 Esophageal varices without bleeding: Secondary | ICD-10-CM | POA: Diagnosis not present

## 2020-02-18 DIAGNOSIS — Z01818 Encounter for other preprocedural examination: Secondary | ICD-10-CM | POA: Diagnosis not present

## 2020-02-18 DIAGNOSIS — R0609 Other forms of dyspnea: Secondary | ICD-10-CM | POA: Diagnosis not present

## 2020-02-19 DIAGNOSIS — Z01818 Encounter for other preprocedural examination: Secondary | ICD-10-CM | POA: Diagnosis not present

## 2020-02-20 DIAGNOSIS — Z86010 Personal history of colonic polyps: Secondary | ICD-10-CM | POA: Diagnosis not present

## 2020-02-20 DIAGNOSIS — F419 Anxiety disorder, unspecified: Secondary | ICD-10-CM | POA: Diagnosis not present

## 2020-02-20 DIAGNOSIS — F54 Psychological and behavioral factors associated with disorders or diseases classified elsewhere: Secondary | ICD-10-CM | POA: Diagnosis not present

## 2020-02-20 DIAGNOSIS — R5383 Other fatigue: Secondary | ICD-10-CM | POA: Diagnosis not present

## 2020-02-20 DIAGNOSIS — Z7682 Awaiting organ transplant status: Secondary | ICD-10-CM | POA: Diagnosis not present

## 2020-02-20 DIAGNOSIS — K8000 Calculus of gallbladder with acute cholecystitis without obstruction: Secondary | ICD-10-CM | POA: Diagnosis not present

## 2020-02-20 DIAGNOSIS — K7581 Nonalcoholic steatohepatitis (NASH): Secondary | ICD-10-CM | POA: Diagnosis not present

## 2020-02-20 DIAGNOSIS — I8500 Esophageal varices without bleeding: Secondary | ICD-10-CM | POA: Diagnosis not present

## 2020-02-20 DIAGNOSIS — Z008 Encounter for other general examination: Secondary | ICD-10-CM | POA: Diagnosis not present

## 2020-02-20 DIAGNOSIS — K766 Portal hypertension: Secondary | ICD-10-CM | POA: Diagnosis not present

## 2020-02-20 DIAGNOSIS — I864 Gastric varices: Secondary | ICD-10-CM | POA: Diagnosis not present

## 2020-02-20 DIAGNOSIS — E119 Type 2 diabetes mellitus without complications: Secondary | ICD-10-CM | POA: Diagnosis not present

## 2020-02-20 DIAGNOSIS — K7460 Unspecified cirrhosis of liver: Secondary | ICD-10-CM | POA: Diagnosis not present

## 2020-02-20 DIAGNOSIS — K922 Gastrointestinal hemorrhage, unspecified: Secondary | ICD-10-CM | POA: Diagnosis not present

## 2020-02-20 DIAGNOSIS — Z7189 Other specified counseling: Secondary | ICD-10-CM | POA: Diagnosis not present

## 2020-02-20 DIAGNOSIS — E785 Hyperlipidemia, unspecified: Secondary | ICD-10-CM | POA: Diagnosis not present

## 2020-02-21 DIAGNOSIS — D500 Iron deficiency anemia secondary to blood loss (chronic): Secondary | ICD-10-CM | POA: Diagnosis not present

## 2020-02-21 DIAGNOSIS — R0600 Dyspnea, unspecified: Secondary | ICD-10-CM | POA: Diagnosis not present

## 2020-02-23 DIAGNOSIS — Z01818 Encounter for other preprocedural examination: Secondary | ICD-10-CM | POA: Insufficient documentation

## 2020-02-29 ENCOUNTER — Other Ambulatory Visit: Payer: Self-pay

## 2020-02-29 ENCOUNTER — Ambulatory Visit: Payer: BC Managed Care – PPO | Attending: Otolaryngology

## 2020-02-29 DIAGNOSIS — R42 Dizziness and giddiness: Secondary | ICD-10-CM | POA: Insufficient documentation

## 2020-02-29 DIAGNOSIS — Z9181 History of falling: Secondary | ICD-10-CM | POA: Diagnosis not present

## 2020-02-29 NOTE — Therapy (Signed)
[place] MAIN [facility] SERVICES [address] [place], [place], [address] Phone: [phone]   Fax:  [phone]  Physical Therapy Evaluation  Patient Details  Name: Jesse Zimmerman MRN: [id number] Date of Birth: 01-29-1961 Referring Provider (PT): Dr. Pryor Ochoa   Encounter Date: 02/29/2020   PT End of Session - 02/29/20 0952    Visit Number 1    Number of Visits 25    Date for PT Re-Evaluation 05/23/20    Authorization Type eval: 02/29/20    PT Start Time 0850    PT Stop Time 0945    PT Time Calculation (min) 55 min    Activity Tolerance Patient tolerated treatment well    Behavior During Therapy [facility] for tasks assessed/performed           Past Medical History:  Diagnosis Date  . Chronic back pain   . Cirrhosis ([place])    NASH  . Diabetes mellitus without complication ([place])   . Esophageal varices ([place])   . Hyperlipidemia   . NASH (nonalcoholic steatohepatitis)     Past Surgical History:  Procedure Laterality Date  . COLONOSCOPY WITH PROPOFOL N/A 08/12/2017   Procedure: COLONOSCOPY WITH PROPOFOL;  Surgeon: Jonathon Bellows, MD;  Location: [facility] ENDOSCOPY;  Service: Gastroenterology;  Laterality: N/A;  . ESOPHAGOGASTRODUODENOSCOPY (EGD) WITH PROPOFOL N/A 04/18/2017   Procedure: ESOPHAGOGASTRODUODENOSCOPY (EGD) WITH PROPOFOL;  Surgeon: [name], MD;  Location: [facility] ENDOSCOPY;  Service: Endoscopy;  Laterality: N/A;  . ESOPHAGOGASTRODUODENOSCOPY (EGD) WITH PROPOFOL N/A 07/18/2018   Procedure: ESOPHAGOGASTRODUODENOSCOPY (EGD) WITH PROPOFOL;  Surgeon: [name], MD;  Location: [facility] ENDOSCOPY;  Service: Endoscopy;  Laterality: N/A;  . EXCESSIVE THIGH / HIP / BUTTOCK / FLANK SKIN EXCISION     PART OF INNER THIGH/DUE TO SEPSIS INFECTION REMOVED  . KIDNEY STONE SURGERY     removed  . TONSILLECTOMY AND ADENOIDECTOMY      There were no vitals filed for this visit.    Subjective Assessment - 02/29/20 0943    Subjective Dizziness    Pertinent  History Pt referred for dizziness from ENT. Preports that after he got the second COVID shot in March 2021 he experienced progressive hearing loss bilaterally as well as dizziness/lightheadedness. He saw his PCP who referred him to ENT who is saw in May. ENT put him on possibly 3 rounds of steroid tapers after performing multiple hearing tests. He recovered most of his hearing in his left ear but has not recovered his hearing in his right ear. He had an MRI that didn't find any explanation for his dizziness or hearing loss. Pt denies having a full VNG study based on therapist's description. Pt reports he had at least 8 falls in the 8-9 months leading up to March. He experienced multiple fractures in ribs and both shoulders. Pt states that he was referred by [facility] to a specialist in [place] but he never received a call so instead they referred him to [facility]. Pt is scheduled for an appointment with an ENT at [facility] next month. On an unrelated note pt has cirrhosis and in January of 2021 he had a gallbladder attack. He saw his liver specialist down at [facility] and was referred to another surgeon who advised him that he would need his gallbladder removed. However ,the plan was changed and they decided not to perform the surgery. He had a second gallbladder attack in February and pt returned to see his liver specialist who put him on the pre liver transplant list  in March/April. He had a lot of testing performed and received a call yesterday that he is currently not a candidate for a liver transplant. He presented to the ER at [facility] on 01/22/20 with fever, chills, weakness, and confusion. Evaluation showed evidence of sepsis. Source was determined to be UTI. Has had previous UTIs but not as severe. Patient has elevated lactic acid and was hypotensive. He was admitted for 4 days and discharged having improved. Pt has a medical history significant of diabetes, hyperlipidemia, esophageal varices and chronic back pain. He  reports neuropathy in L foot and denies history of headaches.    Limitations Walking    Diagnostic tests MRI: no findings to explain dizziness    Patient Stated Goals Improve dizziness    Currently in Pain? No/denies   Intermittent chronic back pain unrelated to current episode          VESTIBULAR AND BALANCE EVALUATION   HISTORY:  Subjective history of current problem: Pt referred for dizziness from ENT. Preports that after he got the second COVID shot in March 2021 he experienced progressive hearing loss bilaterally as well as dizziness/lightheadedness. He saw his PCP who referred him to ENT who is saw in May. ENT put him on possibly 3 rounds of steroid tapers after performing multiple hearing tests. He recovered most of his hearing in his left ear but has not recovered his hearing in his right ear. He had an MRI that didn't find any explanation for his dizziness or hearing loss. Pt denies having a full VNG study based on therapist's description. Pt reports he had at least 8 falls in the 8-9 months leading up to March. He experienced multiple fractures in ribs and both shoulders. Pt states that he was referred by [facility] to a specialist in [place] but he never received a call so instead they referred him to [facility]. Pt is scheduled for an appointment with an ENT at [facility] next month. On an unrelated note pt has cirrhosis and in January of 2021 he had a gallbladder attack. He saw his liver specialist down at [facility] and was referred to another surgeon who advised him that he would need his gallbladder removed. However ,the plan was changed and they decided not to perform the surgery. He had a second gallbladder attack in February and pt returned to see his liver specialist who put him on the pre liver transplant list in March/April. He had a lot of testing performed and received a call yesterday that he is currently not a candidate for a liver transplant. He presented to the ER at [facility] on 01/22/20 with  fever, chills, weakness, and confusion. Evaluation showed evidence of sepsis. Source was determined to be UTI. Has had previous UTIs but not as severe. Patient has elevated lactic acid and was hypotensive. He was admitted for 4 days and discharged having improved. Pt has a medical history significant of diabetes, hyperlipidemia, esophageal varices and chronic back pain. 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  Description of dizziness: (vertigo, unsteadiness, lightheadedness, falling, general unsteadiness, whoozy, swimmy-headed sensation, aural fullness) lightheadedness, pushing Frequency: Multiple times/day  Duration: Unsure Symptom nature: (motion provoked, positional, spontaneous, constant, variable, intermittent) motion provoked and spontaneous  Provocative Factors: Quick moving and turning, first thing in the morning Easing Factors: waiting for symptoms to pass, doesn't bother him during driving or when walking straight  Progression of symptoms: (better, worse, no change since onset) unchanged History of similar episodes: None  Falls (  yes/no): Yes, previous to April/May pt had multiple falls but has not had any since that time.    Auditory complaints (tinnitus, pain, drainage, hearing loss, aural fullness): hearing loss, tinnitus right side, denies pain, denies drainage, R aural fullness Vision (diplopia, visual field loss, recent changes, last eye exam): None, Wears corrective lenses occasionally, mostly for reading  Red Flags: (dysarthria, dysphagia, drop attacks, bowel and bladder changes) Review of systems negative for red flags with the exception of weight fluctuation related to portal hypertension, ascites, and fluid retention     EXAMINATION  POSTURE: WNL, truncal adiposity  NEUROLOGICAL SCREEN: (2+ unless otherwise noted.) N=normal  Ab=abnormal  Level Dermatome R L Myotome R L Reflex R L  C3 Anterior Neck N N Sidebend C2-3 N N Jaw CN V    C4 Top of  Shoulder N N Shoulder Shrug C4 N N Hoffman's UMN    C5 Lateral Upper Arm N N Shoulder ABD C4-5 N N Biceps C5-6    C6 Lateral Arm/ Thumb N N Arm Flex/ Wrist Ext C5-6 N N Brachiorad. C5-6    C7 Middle Finger N N Arm Ext//Wrist Flex C6-7 N N Triceps C7    C8 4th & 5th Finger N N Flex/ Ext Carpi Ulnaris C8 N N Patellar (L3-4)    T1 Medial Arm N N Interossei T1 N N Gastrocnemius    L2 Medial thigh/groin N N Illiopsoas (L2-3) N N     L3 Lower thigh/med.knee N N Quadriceps (L3-4) N N     L4 Medial leg/lat thigh N N Tibialis Ant (L4-5) N N     L5 Lat. leg & dorsal foot N N EHL (L5) N N     S1 post/lat foot/thigh/leg N N Gastrocnemius (S1-2) N N     S2 Post./med. thigh & leg N N Hamstrings (L4-S3) N N       Cranial Nerves Visual acuity and visual fields are intact  Extraocular muscles are intact  Facial sensation is intact bilaterally  Facial strength is intact bilaterally  Hearing is normal as tested by gross conversation, history of hearing loss  Palate elevates midline, normal phonation  Shoulder shrug strength is intact  Tongue protrudes midline    SOMATOSENSORY:         Sensation           Intact      Diminished         Absent  Light touch Normal      COORDINATION: Finger to Nose: Normal Rapid Alternating Movements: Normal Finger to Thumb Opposition: Normal  MUSCULOSKELETAL SCREEN: Cervical Spine ROM: Painless but decreased cervical extension noted   ROM: WNL  MMT: WNL  Functional Mobility: Independent with transfers and ambulation without assistive device    POSTURAL CONTROL TESTS:   Clinical Test of Sensory Interaction for Balance    (CTSIB): Deferred  OCULOMOTOR / VESTIBULAR TESTING:  Oculomotor Exam- Room Light  Findings Comments  Ocular Alignment normal   Ocular ROM normal   Spontaneous Nystagmus normal   Gaze-Holding Nystagmus normal   End-Gaze Nystagmus normal   Vergence (normal 2-3") normal   Smooth Pursuit abnormal Saccadic  Cross-Cover Test not  examined   Saccades abnormal Hypometric horizontally, hypometric and slow vertically  VOR Cancellation normal   Left Head Impulse abnormal Consistent saccades noted with testing  Right Head Impulse normal   Static Acuity not examined   Dynamic Acuity not examined     Oculomotor Exam- Fixation Suppressed: Deferred  BPPV TESTS:  Symptoms Duration Intensity Nystagmus  L Dix-Hallpike None   None  R Dix-Hallpike None   None  L Head Roll None   None  R Head Roll None   None  L Sidelying Test      R Sidelying Test        FUNCTIONAL OUTCOME MEASURES   Results Comments  FOTO 53 Predicted improvement to 72  ABC Scale 73.75% Above cut-off   DHI 18/100 Mild perception of dizziness                  [facility] PT Assessment - 02/29/20 0943      Assessment   Medical Diagnosis Dizziness    Referring Provider (PT) Dr. Pryor Ochoa    Onset Date/Surgical Date 08/20/19   Approximate   Hand Dominance Right    Next MD Visit Not reported    Prior Therapy Never for dizziness      Precautions   Precautions Fall      Restrictions   Weight Bearing Restrictions No      Balance Screen   Has the patient fallen in the past 6 months No    Has the patient had a decrease in activity level because of a fear of falling?  Yes    Is the patient reluctant to leave their home because of a fear of falling?  No      Home Environment   Living Environment Private residence    Living Arrangements Spouse/significant other    Available Help at Discharge Family    Type of [place]      Prior Function   Level of Independence Independent    Vocation Full time employment    Vocation Requirements Works for [employer] in [employer], working in the yard      Cognition   Overall Cognitive Status Within [place] for tasks assessed                      Objective measurements completed on examination: See above findings.               PT Education -  02/29/20 0952    Education Details Plan of care    Person(s) Educated Patient    Methods Explanation    Comprehension Verbalized understanding            PT Short Term Goals - 02/29/20 1357      PT SHORT TERM GOAL #1   Title Pt will be independent with HEP in order to improve strength and balance as well as improve dizziness in order to decrease fall risk and improve function at home and work.    Time 6    Period Weeks    Status New    Target Date 04/11/20             PT Long Term Goals - 02/29/20 1358      PT LONG TERM GOAL #1   Title Pt will improve BERG by at least 3 points in order to demonstrate clinically significant improvement in balance.    Baseline To be performed at next visit    Time 12    Period Weeks    Status New    Target Date 05/23/20      PT LONG TERM GOAL #2   Title Pt will decrease DHI score by at least 18 points in order to demonstrate clinically significant reduction in disability    Baseline  02/29/20: 18/100    Time 12    Period Weeks    Status New    Target Date 05/23/20      PT LONG TERM GOAL #3   Title Patient will improve Foto to at least 72 in order to demonstrate significant improvement in function    Baseline 02/29/20: 53    Time 12    Period Weeks    Status New    Target Date 05/23/20                  Plan - 02/29/20 1347    Clinical Impression Statement Patient is a pleasant 59 year old male who was referred to vestibular physical therapy for dizziness.  Patient suffered bilateral hearing loss which he reports is related to his second Covid vaccination. He has a complex medical history and session was dominated by history intake so additional testing will need to be performed at next visit.  However based on testing performed during evaluation patient has a combination of both central and peripheral findings.  Patient with saccadic smooth pursuits as well as consistently hypometric and slow horizontal and vertical saccades.   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 Overall he rates his disability as mild with a DHI score of 18 out of 100.  ABC scale score is within normal limits at 73.75%.  Patient reports that he has a appointment with [facility] next month for additional testing. He will benefit from skilled PT services to address deficits in dizziness and balance and decrease risk for future falls as well as improve his function at home and work.    Personal Factors and Comorbidities Comorbidity 3+    Comorbidities DM, portal HTN, cirrhosis    Examination-Activity Limitations Locomotion Level    Examination-Participation Restrictions Community Activity;Occupation;Yard Work    Stability/Clinical Decision Making Unstable/Unpredictable    Surveyor, mining    Rehab Potential Fair    PT Frequency 2x / week    PT Duration 12 weeks    PT Treatment/Interventions ADLs/Self Care Home Management;Aquatic Therapy;Biofeedback;Canalith Repostioning;Cryotherapy;Electrical Stimulation;Iontophoresis 44m/ml Dexamethasone;Moist Heat;Traction;Ultrasound;Fluidtherapy;Contrast Bath;DME Instruction;Gait training;Stair training;Functional mobility training;Therapeutic activities;Therapeutic exercise;Balance training;Neuromuscular re-education;Cognitive remediation;Patient/family education;Manual techniques;Passive range of motion;Dry needling;Energy conservation;Vestibular;Joint Manipulations    PT Next Visit Plan Fixation suppression vestibular testing, BERG, DGI, initiation of HEP    PT Home Exercise Plan None currently    Consulted and Agree with Plan of Care Patient           Patient will benefit from skilled therapeutic intervention in order to improve the following deficits and impairments:  Dizziness, Decreased balance, Obesity  Visit Diagnosis: Dizziness and giddiness - Plan: PT plan of care  cert/re-cert  History of falling - Plan: PT plan of care cert/re-cert     Problem List Patient Active Problem List   Diagnosis Date Noted  . Sepsis ([place] 01/22/2020  . ARF (acute renal failure) ([place] 01/22/2020  . Thrombocytopenia ([place] 01/22/2020  . Melena   . Acute gastric ulcer with hemorrhage   . Acute gastritis without hemorrhage   . GI bleed 07/17/2018  . Blood in stool   . Secondary esophageal varices without bleeding ([place]   . Portal hypertension ([place]   . Upper GI bleed 04/17/2017  . Contusion, back 12/26/2014  . Bruising 12/26/2014  . Back pain, chronic 11/22/2014  . Diabetes mellitus type 2 in obese (  [place]) 11/22/2014  . 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 Acid reflux 11/22/2014  . HLD (hyperlipidemia) 11/22/2014  . BP (high blood pressure) 11/22/2014  . 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 Cannot sleep 11/22/2014  . Cirrhosis ([place]) 11/22/2014  . Adiposity 11/22/2014  . Change in blood platelet count 11/22/2014  . Avitaminosis D 11/22/2014  . 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 Esophageal and gastric varices (H[facility]) 05/08/2012   Phillips Grout PT, DPT, GCS  Jesse Zimmerman 02/29/2020, 2:03 PM  [place] MAIN [facility] SERVICES [address] [place], [place], [address] Phone: [phone]   Fax:  [phone]  Name: Jesse Zimmerman MRN: [id number] Date of Birth: 1960/07/07

## 2020-03-07 DIAGNOSIS — E119 Type 2 diabetes mellitus without complications: Secondary | ICD-10-CM | POA: Diagnosis not present

## 2020-03-07 DIAGNOSIS — I5083 High output heart failure: Secondary | ICD-10-CM | POA: Diagnosis not present

## 2020-03-07 DIAGNOSIS — E785 Hyperlipidemia, unspecified: Secondary | ICD-10-CM | POA: Diagnosis not present

## 2020-03-07 DIAGNOSIS — Z01818 Encounter for other preprocedural examination: Secondary | ICD-10-CM | POA: Diagnosis not present

## 2020-03-07 DIAGNOSIS — R0600 Dyspnea, unspecified: Secondary | ICD-10-CM | POA: Diagnosis not present

## 2020-03-12 ENCOUNTER — Ambulatory Visit: Payer: BC Managed Care – PPO

## 2020-03-12 ENCOUNTER — Other Ambulatory Visit: Payer: Self-pay

## 2020-03-12 DIAGNOSIS — R42 Dizziness and giddiness: Secondary | ICD-10-CM

## 2020-03-12 DIAGNOSIS — Z9181 History of falling: Secondary | ICD-10-CM

## 2020-03-12 NOTE — Patient Instructions (Addendum)
  Access Code: [email or web address] URL: [email or web address] Date: 03/12/2020 Prepared by: Roxana Hires  Exercises Standing Gaze Stabilization with Head Rotation - 4 x daily - 7 x weekly - 3 reps - 30 seconds hold

## 2020-03-12 NOTE — Therapy (Signed)
[place] MA[place] [facility] SERVICES [address] [place], [place], [address] Phone: [phone]   Fax:  [phone]  Physical Therapy Treatment  Patient Details  Name: Jesse Zimmerman MRN: [id number] Date of Birth: 1960/10/04 Referring Provider (PT): Dr. Pryor Ochoa   Encounter Date: 03/12/2020   PT End of Session - 03/12/20 1014    Visit Number 2    Number of Visits 25    Date for PT Re-Evaluation 05/23/20    Authorization Type eval: 02/29/20    PT Start Time 1015    PT Stop Time 1100    PT Time Calculation (min) 45 min    Activity Tolerance Patient tolerated treatment well    Behavior During Therapy [facility] for tasks assessed/performed           Past Medical History:  Diagnosis Date  . Chronic back pain   . Cirrhosis ([place])    NASH  . Diabetes mellitus without complication ([place])   . Esophageal varices ([place])   . Hyperlipidemia   . NASH (nonalcoholic steatohepatitis)     Past Surgical History:  Procedure Laterality Date  . COLONOSCOPY WITH PROPOFOL N/A 08/12/2017   Procedure: COLONOSCOPY WITH PROPOFOL;  Surgeon: Jonathon Bellows, MD;  Location: [facility] ENDOSCOPY;  Service: Gastroenterology;  Laterality: N/A;  . ESOPHAGOGASTRODUODENOSCOPY (EGD) WITH PROPOFOL N/A 04/18/2017   Procedure: ESOPHAGOGASTRODUODENOSCOPY (EGD) WITH PROPOFOL;  Surgeon: [name], MD;  Location: [facility] ENDOSCOPY;  Service: Endoscopy;  Laterality: N/A;  . ESOPHAGOGASTRODUODENOSCOPY (EGD) WITH PROPOFOL N/A 07/18/2018   Procedure: ESOPHAGOGASTRODUODENOSCOPY (EGD) WITH PROPOFOL;  Surgeon: [name], MD;  Location: [facility] ENDOSCOPY;  Service: Endoscopy;  Laterality: N/A;  . EXCESSIVE THIGH / HIP / BUTTOCK / FLANK SKIN EXCISION     PART OF INNER THIGH/DUE TO SEPSIS INFECTION REMOVED  . KIDNEY STONE SURGERY     removed  . TONSILLECTOMY AND ADENOIDECTOMY      There were no vitals filed for this visit.   Subjective Assessment - 03/12/20 1012    Subjective Patient reports he is  doing well today.  He has been feeling somewhat dizzy this morning.  No fall since last therapy session.  No specific questions or concerns.    Pertinent History Pt referred for dizziness from ENT. Preports that after he got the second COVID shot in March 2021 he experienced progressive hearing loss bilaterally as well as dizziness/lightheadedness. He saw his PCP who referred him to ENT who is saw in May. ENT put him on possibly 3 rounds of steroid tapers after performing multiple hearing tests. He recovered most of his hearing in his left ear but has not recovered his hearing in his right ear. He had an MRI that didn't find any explanation for his dizziness or hearing loss. Pt denies having a full VNG study based on therapist's description. Pt reports he had at least 8 falls in the 8-9 months leading up to March. He experienced multiple fractures in ribs and both shoulders. Pt states that he was referred by [facility] to a specialist in [place] but he never received a call so instead they referred him to [facility]. Pt is scheduled for an appointment with an ENT at [facility] next month. On an unrelated note pt has cirrhosis and in January of 2021 he had a gallbladder attack. He saw his liver specialist down at [facility] and was referred to another surgeon who advised him that he would need his gallbladder removed. However ,the plan was changed and they decided not to perform  the surgery. He had a second gallbladder attack in February and pt returned to see his liver specialist who put him on the pre liver transplant list in March/April. He had a lot of testing performed and received a call yesterday that he is currently not a candidate for a liver transplant. He presented to the ER at [facility] on 01/22/20 with fever, chills, weakness, and confusion. Evaluation showed evidence of sepsis. Source was determined to be UTI. Has had previous UTIs but not as severe. Patient has elevated lactic acid and was hypotensive. He was admitted for 4  days and discharged having improved. Pt has a medical history significant of diabetes, hyperlipidemia, esophageal varices and chronic back pain. 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    Limitations Walking    Diagnostic tests MRI: no findings to explain dizziness    Patient Stated Goals Improve dizziness    Currently in Pain? No/denies              [facility] PT Assessment - 03/12/20 1036      Standardized Balance Assessment   Standardized Balance Assessment Berg Balance Test;Dynamic Gait Index      Berg Balance Test   Sit to Stand Able to stand without using hands and stabilize independently    Standing Unsupported Able to stand safely 2 minutes    Sitting with Back Unsupported but Feet Supported on Floor or Stool Able to sit safely and securely 2 minutes    Stand to Sit Sits safely with minimal use of hands    Transfers Able to transfer safely, minor use of hands    Standing Unsupported with Eyes Closed Able to stand 10 seconds safely    Standing Unsupported with Feet Together Able to place feet together independently and stand 1 minute safely    From Standing, Reach Forward with Outstretched Arm Can reach confidently >25 cm (10")    From Standing Position, Pick up Object from Floor Able to pick up shoe safely and easily    From Standing Position, Turn to Look Behind Over each Shoulder Looks behind from both sides and weight shifts well    Turn 360 Degrees Able to turn 360 degrees safely in 4 seconds or less    Standing Unsupported, Alternately Place Feet on Step/Stool Able to stand independently and safely and complete 8 steps in 20 seconds    Standing Unsupported, One Foot in Front Able to plae foot ahead of the other independently and hold 30 seconds    Standing on One Leg Tries to lift leg/unable to hold 3 seconds but remains standing independently    Total Score 52      Dynamic Gait Index   Level Surface Normal    Change in Gait Speed Normal    Gait  with Horizontal Head Turns Mild Impairment    Gait with Vertical Head Turns Normal    Gait and Pivot Turn Normal    Step Over Obstacle Normal    Step Around Obstacles Normal    Steps Mild Impairment    Total Score 22              TREATMENT   Neuromuscular Re-education    Oculomotor Exam- Fixation Suppressed  Findings Comments  Ocular Alignment normal   Spontaneous Nystagmus normal   Gaze-Holding Nystagmus normal   End-Gaze Nystagmus normal   Head Shaking Nystagmus normal   Pressure-Induced Nystagmus normal   Hyperventilation Induced Nystagmus normal   Skull Vibration Induced  Nystagmus abnormal Questionable few L horizontal beats of nystagmus     Performed BERG (52/56) and DGI (22/24) with patient; mCTSIB: 30s for conditions 1-3 with min sway during conditions 2 and 3, condition 4 is 4s with LOB; VOR x 1 horizontal sitting x 60s, pt denies any dizziness; VOR x 1 horizontal standing x 30s, 3 reps, pt reports 5/10 dizziness (issued for HEP);  Orthostatic vitals: Supine: BP: 114/55 HR: 51 SpO2: 100% Seated: BP: 110/52  HR: 57 SpO2: 100% Standing (1 min): BP: 96/44  HR: SpO2: 100%  While pt is supine during orthostatic vitals he reports feeling mildly lightheaded and dizzy. Performed roll test to assess for BPPV and during right roll test patient experiences dizziness with up beating right torsional nystagmus which resolves after approximately 15 to 20 seconds.   Pt educated throughout session about proper posture and technique with exercises. Improved exercise technique, movement at target joints, use of target muscles after min to mod verbal, visual, tactile cues.    Pt demonstrates excellent motivation during session.  Performed fixation suppression testing with IR goggles today without any significant findings. There was questionably a few L horizontal beats of nystagmus during skull vibration however not overly convincing. While pt is supine during orthostatic vitals  he reports feeling mildly lightheaded and dizzy. Performed roll test to assess for BPPV and during right roll test patient experiences dizziness with up beating right torsional nystagmus which resolves after approximately 15 to 20 seconds. Will repeat Dix-Hallpike testing at next visit and treat with Epley maneuver as needed. Also completed BERG and DGI with patient which reveals only high level balance deficits. Pt will benefit from PT services to address deficits in strength, balance, and mobility in order to return to full function at home.                          PT Short Term Goals - 02/29/20 1357      PT SHORT TERM GOAL #1   Title Pt will be independent with HEP in order to improve strength and balance as well as improve dizziness in order to decrease fall risk and improve function at home and work.    Time 6    Period Weeks    Status New    Target Date 04/11/20             PT Long Term Goals - 02/29/20 1358      PT LONG TERM GOAL #1   Title Pt will improve BERG by at least 3 points in order to demonstrate clinically significant improvement in balance.    Baseline To be performed at next visit    Time 12    Period Weeks    Status New    Target Date 05/23/20      PT LONG TERM GOAL #2   Title Pt will decrease DHI score by at least 18 points in order to demonstrate clinically significant reduction in disability    Baseline 02/29/20: 18/100    Time 12    Period Weeks    Status New    Target Date 05/23/20      PT LONG TERM GOAL #3   Title Patient will improve Foto to at least 72 in order to demonstrate significant improvement in function    Baseline 02/29/20: 53    Time 12    Period Weeks    Status New    Target Date 05/23/20  Plan - 03/12/20 1014    Clinical Impression Statement Pt demonstrates excellent motivation during session.  Performed fixation suppression testing with IR goggles today without any significant findings.  There was questionably a few L horizontal beats of nystagmus during skull vibration however not overly convincing. While pt is supine during orthostatic vitals he reports feeling mildly lightheaded and dizzy. Performed roll test to assess for BPPV and during right roll test patient experiences dizziness with up beating right torsional nystagmus which resolves after approximately 15 to 20 seconds. Will repeat Dix-Hallpike testing at next visit and treat with Epley maneuver as needed. Also completed BERG and DGI with patient which reveals only high level balance deficits. Pt will benefit from PT services to address deficits in strength, balance, and mobility in order to return to full function at home.    Personal Factors and Comorbidities Comorbidity 3+    Comorbidities DM, portal HTN, cirrhosis    Examination-Activity Limitations Locomotion Level    Examination-Participation Restrictions Community Activity;Occupation;Yard Work    Stability/Clinical Decision Making Unstable/Unpredictable    Rehab Potential Fair    PT Frequency 2x / week    PT Duration 12 weeks    PT Treatment/Interventions ADLs/Self Care Home Management;Aquatic Therapy;Biofeedback;Canalith Repostioning;Cryotherapy;Electrical Stimulation;Iontophoresis 70m/ml Dexamethasone;Moist Heat;Traction;Ultrasound;Fluidtherapy;Contrast Bath;DME Instruction;Gait training;Stair training;Functional mobility training;Therapeutic activities;Therapeutic exercise;Balance training;Neuromuscular re-education;Cognitive remediation;Patient/family education;Manual techniques;Passive range of motion;Dry needling;Energy conservation;Vestibular;Joint Manipulations    PT Next Visit Plan Fixation suppression vestibular testing, BERG, DGI, initiation of HEP    PT Home Exercise Plan Access Code: [email or web address]   Consulted and Agree with Plan of Care Patient           Patient will benefit from skilled therapeutic intervention in order to improve the following deficits  and impairments:  Dizziness, Decreased balance, Obesity  Visit Diagnosis: Dizziness and giddiness  History of falling     Problem List Patient Active Problem List   Diagnosis Date Noted  . Sepsis ([place] 01/22/2020  . ARF (acute renal failure) ([place] 01/22/2020  . Thrombocytopenia ([place] 01/22/2020  . Melena   . Acute gastric ulcer with hemorrhage   . Acute gastritis without hemorrhage   . GI bleed 07/17/2018  . Blood in stool   . Secondary esophageal varices without bleeding ([place]   . Portal hypertension ([place]   . Upper GI bleed 04/17/2017  . Contusion, back 12/26/2014  . Bruising 12/26/2014  . Back pain, chronic 11/22/2014  . Diabetes mellitus type 2 in obese ([place] 11/22/2014  . 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 Acid reflux 11/22/2014  . HLD (hyperlipidemia) 11/22/2014  . BP (high blood pressure) 11/22/2014  . 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 Cannot sleep 11/22/2014  . Cirrhosis ([place] 11/22/2014  . Adiposity 11/22/2014  . Change in blood platelet count 11/22/2014  . Avitaminosis D 11/22/2014  . 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 Esophageal and gastric varices ([place] 05/08/2012   JPhillips GroutPT, DPT, GCS  Asir Bingley 03/12/2020, 1:02 PM  [place] [facility]ICES [address] [place] [address]Phone: [phone]  Fax:  [phone] Name: Jesse CABELLOMRN: [id number] of Birth: 1April 08, 1962

## 2020-03-19 ENCOUNTER — Ambulatory Visit: Payer: BC Managed Care – PPO

## 2020-03-19 DIAGNOSIS — K7581 Nonalcoholic steatohepatitis (NASH): Secondary | ICD-10-CM | POA: Diagnosis not present

## 2020-03-19 DIAGNOSIS — K7460 Unspecified cirrhosis of liver: Secondary | ICD-10-CM | POA: Diagnosis not present

## 2020-03-19 DIAGNOSIS — K766 Portal hypertension: Secondary | ICD-10-CM | POA: Diagnosis not present

## 2020-03-23 ENCOUNTER — Other Ambulatory Visit: Payer: Self-pay | Admitting: Family Medicine

## 2020-03-23 DIAGNOSIS — M7989 Other specified soft tissue disorders: Secondary | ICD-10-CM

## 2020-03-24 NOTE — Telephone Encounter (Signed)
Requested Prescriptions  Pending Prescriptions Disp Refills  . spironolactone (ALDACTONE) 100 MG tablet [Pharmacy Med Name: SPIRONOLACTONE  100MG  TAB] 180 tablet 3    Sig: TAKE 50 MG BY MOUTH TWICE  DAILY FOR 1 WEEK AND THEN  INCREASE TO 100 MG TWICE  DAILY     Cardiovascular: Diuretics - Aldosterone Antagonist Failed - 03/23/2020  9:19 PM      Failed - Cr in normal range and within 360 days    Creatinine  Date Value Ref Range Status  06/11/2013 1.14 0.60 - 1.30 mg/dL Final   Creatinine, Ser  Date Value Ref Range Status  02/13/2020 1.33 (H) 0.76 - 1.27 mg/dL Final         Passed - K in normal range and within 360 days    Potassium  Date Value Ref Range Status  02/13/2020 4.5 3.5 - 5.2 mmol/L Final  06/11/2013 4.1 3.5 - 5.1 mmol/L Final         Passed - Na in normal range and within 360 days    Sodium  Date Value Ref Range Status  02/13/2020 139 134 - 144 mmol/L Final  06/11/2013 133 (L) 136 - 145 mmol/L Final         Passed - Last BP in normal range    BP Readings from Last 1 Encounters:  01/31/20 111/65         Passed - Valid encounter within last 6 months    Recent Outpatient Visits          1 month ago Swelling of lower leg   [facility] Jerrol Banana., MD   2 months ago Diabetes mellitus type 2 in obese [facility])   [facility] Jerrol Banana., MD   4 months ago Other cirrhosis of liver [facility])   [facility] Jerrol Banana., MD   5 months ago Rib pain on right side   [facility] Jerrol Banana., MD   7 months ago Fall, initial encounter   [facility] Jerrol Banana., MD      Future Appointments            In 2 weeks Jerrol Banana., MD [facility]

## 2020-03-25 LAB — HM DIABETES EYE EXAM

## 2020-03-26 ENCOUNTER — Ambulatory Visit: Payer: BC Managed Care – PPO

## 2020-03-27 DIAGNOSIS — R42 Dizziness and giddiness: Secondary | ICD-10-CM | POA: Diagnosis not present

## 2020-03-27 DIAGNOSIS — H903 Sensorineural hearing loss, bilateral: Secondary | ICD-10-CM | POA: Diagnosis not present

## 2020-03-27 DIAGNOSIS — H9311 Tinnitus, right ear: Secondary | ICD-10-CM | POA: Diagnosis not present

## 2020-04-02 ENCOUNTER — Ambulatory Visit: Payer: BC Managed Care – PPO

## 2020-04-08 NOTE — Progress Notes (Signed)
Established patient visit   Patient: Jesse Zimmerman   DOB: 1961/05/20   59 y.o. Male  MRN: [id number] Visit Date: 04/09/2020  Today's healthcare provider: Wilhemena Durie, MD   Chief Complaint  Patient presents with  . Diabetes  . Hypertension   Subjective    HPI  Patient presently stable.  He is going through liver transplant protocol at [facility]. He also needs hearing aid for his right ear.  He is planning to retire March 2022. Diabetic retinopathy followed by ophthalmology. Diabetes Mellitus Type II, follow-up  Lab Results  Component Value Date   HGBA1C 8.9 (A) 04/09/2020   HGBA1C 9.7 (A) 01/03/2020   HGBA1C 8.4 (H) 08/15/2019   Last seen for diabetes 3 months ago.  Management since then includes; Patient to work on diet and hopefully some exercise.  Consider adding Ozempic. He reports good compliance with treatment. He is not having side effects.   Home blood sugar records: fasting range: not being checked  Episodes of hypoglycemia? No    Current insulin regiment: none Most Recent Eye Exam: 03/25/2020  --------------------------------------------------------------------------------------------------- Hypertension, follow-up  BP Readings from Last 3 Encounters:  04/09/20 (!) 109/43  01/31/20 111/65  01/25/20 (!) 142/68   Wt Readings from Last 3 Encounters:  04/09/20 292 lb (132.5 kg)  01/31/20 (!) 316 lb (143.3 kg)  01/22/20 295 lb (133.8 kg)     He was last seen for hypertension 3 months ago.  BP at that visit was 111/65. Management since that visit includes; controlled. He reports good compliance with treatment. He is not having side effects.  He is not exercising. He is adherent to low salt diet.   Outside blood pressures are not being checked at home.  He does not smoke.  Use of agents associated with hypertension: none.   ---------------------------------------------------------------------------------------------------       Medications: Outpatient Medications Prior to Visit  Medication Sig  . albuterol (VENTOLIN HFA) 108 (90 Base) MCG/ACT inhaler Inhale 2 puffs into the lungs every 6 (six) hours as needed for wheezing or shortness of breath.  . blood glucose meter kit and supplies Dispense based on patient and insurance preference. Use once daily as directed. (FOR ICD-10 E11.69).  . ondansetron (ZOFRAN ODT) 4 MG disintegrating tablet Take 1 tablet (4 mg total) by mouth every 8 (eight) hours as needed for nausea or vomiting.  . promethazine (PHENERGAN) 25 MG tablet Take 1 tablet (25 mg total) by mouth every 8 (eight) hours as needed for nausea or vomiting.  . propranolol (INDERAL) 20 MG tablet Take 1 tablet (20 mg total) by mouth 2 (two) times daily.  Marland Kitchen spironolactone (ALDACTONE) 100 MG tablet TAKE 50 MG BY MOUTH TWICE  DAILY FOR 1 WEEK AND THEN  INCREASE TO 100 MG TWICE  DAILY  . tamsulosin (FLOMAX) 0.4 MG CAPS capsule Take 1 capsule (0.4 mg total) by mouth daily.  . traMADol (ULTRAM) 50 MG tablet Take 1 to 2 tablets every 8 hours and as needed for pain  . ursodiol (ACTIGALL) 300 MG capsule Take 300 mg by mouth 2 (two) times daily.   . furosemide (LASIX) 20 MG tablet Take 2 tablets (40 mg total) by mouth daily.   No facility-administered medications prior to visit.    Review of Systems  Constitutional: Negative for appetite change, chills and fever.  Respiratory: Negative for chest tightness, shortness of breath and wheezing.   Cardiovascular: Negative for chest pain and palpitations.  Gastrointestinal: Negative for abdominal pain, nausea  and vomiting.    Last hemoglobin A1c Lab Results  Component Value Date   HGBA1C 8.9 (A) 04/09/2020      Objective    BP (!) 109/43 (BP Location: Left Arm, Patient Position: Sitting, Cuff Size: Normal)   Pulse (!) 56   Temp 98.1 F (36.7 C) (Oral)   Ht 6' (1.829 m)   Wt 292 lb (132.5 kg)   SpO2 100%   BMI 39.60 kg/m  BP Readings from Last 3 Encounters:   04/09/20 (!) 109/43  01/31/20 111/65  01/25/20 (!) 142/68   Wt Readings from Last 3 Encounters:  04/09/20 292 lb (132.5 kg)  01/31/20 (!) 316 lb (143.3 kg)  01/22/20 295 lb (133.8 kg)      Physical Exam Vitals reviewed.  Constitutional:      Appearance: Normal appearance. He is obese.  HENT:     Head: Normocephalic and atraumatic.     Right Ear: External ear normal.     Left Ear: External ear normal.     Nose: Nose normal.     Mouth/Throat:     Pharynx: Oropharynx is clear.  Eyes:     General: No scleral icterus.    Conjunctiva/sclera: Conjunctivae normal.  Neck:     Comments: No JVD noted. Cardiovascular:     Rate and Rhythm: Normal rate and regular rhythm.     Pulses: Normal pulses.     Heart sounds: Normal heart sounds.  Pulmonary:     Effort: Pulmonary effort is normal.  Abdominal:     Palpations: Abdomen is soft.     Tenderness: There is no abdominal tenderness.  Musculoskeletal:     Comments: 1+  edema.  Skin:    General: Skin is warm and dry.  Neurological:     General: No focal deficit present.     Mental Status: He is alert and oriented to person, place, and time. Mental status is at baseline.  Psychiatric:        Mood and Affect: Mood normal.        Behavior: Behavior normal.        Thought Content: Thought content normal.        Judgment: Judgment normal.                              Results for orders placed or performed in visit on 04/09/20  POCT HgB A1C  Result Value Ref Range   Hemoglobin A1C 8.9 (A) 4.0 - 5.6 %    Assessment & Plan    1. Diabetes mellitus type 2 in obese ([place]) Start Trulicity. Follow up in 3 months  - POCT HgB A1C--8.9 today  2. Essential hypertension Well controlled Continue current medications F/u in 3 months  3. Need for influenza vaccination  - Flu Vaccine QUAD 6+ mos PF IM (Fluarix Quad PF)  4. Secondary esophageal varices without bleeding ([place])   5. Portal hypertension (HCC)   6. Other  cirrhosis of liver ([place])   7. Hearing loss, sensorineural, asymmetrical   8. Class 3 severe obesity due to excess calories with serious comorbidity and body mass index (BMI) of 40.0 to 44.9 in adult [facility])    Return in about 3 months (around 07/10/2020).         Ledford Goodson Cranford Mon, MD  [facility] [phone] (phone) [phone] (fax)  [place]

## 2020-04-09 ENCOUNTER — Ambulatory Visit: Payer: BC Managed Care – PPO

## 2020-04-09 ENCOUNTER — Other Ambulatory Visit: Payer: Self-pay

## 2020-04-09 ENCOUNTER — Encounter: Payer: Self-pay | Admitting: Family Medicine

## 2020-04-09 ENCOUNTER — Ambulatory Visit (INDEPENDENT_AMBULATORY_CARE_PROVIDER_SITE_OTHER): Payer: BC Managed Care – PPO | Admitting: Family Medicine

## 2020-04-09 VITALS — BP 109/43 | HR 56 | Temp 98.1°F | Ht 72.0 in | Wt 292.0 lb

## 2020-04-09 DIAGNOSIS — H903 Sensorineural hearing loss, bilateral: Secondary | ICD-10-CM

## 2020-04-09 DIAGNOSIS — E1169 Type 2 diabetes mellitus with other specified complication: Secondary | ICD-10-CM | POA: Diagnosis not present

## 2020-04-09 DIAGNOSIS — I8510 Secondary esophageal varices without bleeding: Secondary | ICD-10-CM

## 2020-04-09 DIAGNOSIS — E669 Obesity, unspecified: Secondary | ICD-10-CM | POA: Diagnosis not present

## 2020-04-09 DIAGNOSIS — I10 Essential (primary) hypertension: Secondary | ICD-10-CM | POA: Diagnosis not present

## 2020-04-09 DIAGNOSIS — Z23 Encounter for immunization: Secondary | ICD-10-CM

## 2020-04-09 DIAGNOSIS — Z6841 Body Mass Index (BMI) 40.0 and over, adult: Secondary | ICD-10-CM

## 2020-04-09 DIAGNOSIS — K7469 Other cirrhosis of liver: Secondary | ICD-10-CM

## 2020-04-09 DIAGNOSIS — K766 Portal hypertension: Secondary | ICD-10-CM

## 2020-04-09 LAB — POCT GLYCOSYLATED HEMOGLOBIN (HGB A1C): Hemoglobin A1C: 8.9 % — AB (ref 4.0–5.6)

## 2020-04-09 MED ORDER — TRULICITY 0.75 MG/0.5ML ~~LOC~~ SOAJ
0.7500 mg | SUBCUTANEOUS | 12 refills | Status: DC
Start: 2020-04-09 — End: 2020-09-15

## 2020-04-17 ENCOUNTER — Ambulatory Visit: Payer: BC Managed Care – PPO

## 2020-05-02 DIAGNOSIS — H903 Sensorineural hearing loss, bilateral: Secondary | ICD-10-CM | POA: Diagnosis not present

## 2020-05-20 DIAGNOSIS — K7460 Unspecified cirrhosis of liver: Secondary | ICD-10-CM | POA: Diagnosis not present

## 2020-05-20 DIAGNOSIS — K7581 Nonalcoholic steatohepatitis (NASH): Secondary | ICD-10-CM | POA: Diagnosis not present

## 2020-05-21 DIAGNOSIS — K7581 Nonalcoholic steatohepatitis (NASH): Secondary | ICD-10-CM | POA: Diagnosis not present

## 2020-05-21 DIAGNOSIS — K766 Portal hypertension: Secondary | ICD-10-CM | POA: Diagnosis not present

## 2020-05-21 DIAGNOSIS — K7460 Unspecified cirrhosis of liver: Secondary | ICD-10-CM | POA: Diagnosis not present

## 2020-05-21 DIAGNOSIS — I8500 Esophageal varices without bleeding: Secondary | ICD-10-CM | POA: Diagnosis not present

## 2020-06-11 ENCOUNTER — Other Ambulatory Visit: Payer: Self-pay | Admitting: Family Medicine

## 2020-06-11 DIAGNOSIS — E669 Obesity, unspecified: Secondary | ICD-10-CM

## 2020-06-11 DIAGNOSIS — E1169 Type 2 diabetes mellitus with other specified complication: Secondary | ICD-10-CM

## 2020-06-27 ENCOUNTER — Other Ambulatory Visit: Payer: Self-pay | Admitting: Family Medicine

## 2020-06-27 DIAGNOSIS — E1169 Type 2 diabetes mellitus with other specified complication: Secondary | ICD-10-CM

## 2020-07-10 ENCOUNTER — Ambulatory Visit: Payer: BC Managed Care – PPO | Admitting: Family Medicine

## 2020-09-15 ENCOUNTER — Other Ambulatory Visit: Payer: Self-pay | Admitting: Family Medicine

## 2020-09-15 MED ORDER — TRULICITY 0.75 MG/0.5ML ~~LOC~~ SOAJ: 0.7500 mg | SUBCUTANEOUS | 12 refills | Status: DC

## 2020-09-15 NOTE — Telephone Encounter (Signed)
Medication Refill - Medication:   Dulaglutide (TRULICITY) 5.67 OL/4.1CV SOPN   Has the patient contacted their pharmacy? Yes.  contact pcp.  (Agent: If no, request that the patient contact the pharmacy for the refill.) (Agent: If yes, when and what did the pharmacy advise?)  Preferred Pharmacy (with phone number or street name):   [id number] [address], [place] [address]  [place] [address]  Phone: [phone] Fax: [phone]    Agent: Please be advised that RX refills may take up to 3 business days. We ask that you follow-up with your pharmacy.

## 2020-10-03 ENCOUNTER — Other Ambulatory Visit: Payer: Self-pay | Admitting: Family Medicine

## 2020-10-03 DIAGNOSIS — E669 Obesity, unspecified: Secondary | ICD-10-CM

## 2020-10-03 DIAGNOSIS — E1169 Type 2 diabetes mellitus with other specified complication: Secondary | ICD-10-CM

## 2020-10-06 ENCOUNTER — Telehealth: Payer: Self-pay

## 2020-10-06 MED ORDER — TRULICITY 0.75 MG/0.5ML ~~LOC~~ SOAJ: 0.7500 mg | SUBCUTANEOUS | 12 refills | Status: DC

## 2020-10-06 NOTE — Telephone Encounter (Signed)
[facility] faxed refill request for the following medications:  Dulaglutide (TRULICITY) 0.45 VP/3.6UZ SOPN   Please advise.

## 2020-10-22 ENCOUNTER — Encounter: Payer: Self-pay | Admitting: Family Medicine

## 2020-10-22 ENCOUNTER — Ambulatory Visit (INDEPENDENT_AMBULATORY_CARE_PROVIDER_SITE_OTHER): Payer: BC Managed Care – PPO | Admitting: Family Medicine

## 2020-10-22 DIAGNOSIS — R110 Nausea: Secondary | ICD-10-CM

## 2020-10-22 DIAGNOSIS — H6500 Acute serous otitis media, unspecified ear: Secondary | ICD-10-CM | POA: Diagnosis not present

## 2020-10-22 DIAGNOSIS — E1169 Type 2 diabetes mellitus with other specified complication: Secondary | ICD-10-CM | POA: Diagnosis not present

## 2020-10-22 DIAGNOSIS — J301 Allergic rhinitis due to pollen: Secondary | ICD-10-CM

## 2020-10-22 DIAGNOSIS — E669 Obesity, unspecified: Secondary | ICD-10-CM

## 2020-10-22 DIAGNOSIS — R059 Cough, unspecified: Secondary | ICD-10-CM

## 2020-10-22 DIAGNOSIS — L02224 Furuncle of groin: Secondary | ICD-10-CM | POA: Diagnosis not present

## 2020-10-22 MED ORDER — PROMETHAZINE HCL 25 MG PO TABS: 25.0000 mg | ORAL_TABLET | Freq: Three times a day (TID) | ORAL | 1 refills | Status: DC | PRN

## 2020-10-22 MED ORDER — CETIRIZINE HCL 10 MG PO TABS: 10.0000 mg | ORAL_TABLET | Freq: Every day | ORAL | 11 refills | Status: DC

## 2020-10-22 MED ORDER — ALBUTEROL SULFATE HFA 108 (90 BASE) MCG/ACT IN AERS: 2.0000 | INHALATION_SPRAY | Freq: Four times a day (QID) | RESPIRATORY_TRACT | 2 refills | Status: DC | PRN

## 2020-10-22 MED ORDER — SYNJARDY XR 10-1000 MG PO TB24: 1.0000 | ORAL_TABLET | Freq: Every day | ORAL | 3 refills | Status: DC

## 2020-10-22 MED ORDER — AMOXICILLIN-POT CLAVULANATE 875-125 MG PO TABS: 1.0000 | ORAL_TABLET | Freq: Two times a day (BID) | ORAL | 1 refills | Status: DC

## 2020-10-22 MED ORDER — ALBUTEROL SULFATE HFA 108 (90 BASE) MCG/ACT IN AERS: 2.0000 | INHALATION_SPRAY | Freq: Four times a day (QID) | RESPIRATORY_TRACT | 1 refills | Status: DC | PRN

## 2020-10-22 NOTE — Progress Notes (Signed)
MyChart Video Visit    Virtual Visit via Video Note   This visit type was conducted due to national recommendations for restrictions regarding the COVID-19 Pandemic (e.g. social distancing) in an effort to limit this patient's exposure and mitigate transmission in our community. This patient is at least at moderate risk for complications without adequate follow up. This format is felt to be most appropriate for this patient at this time. Physical exam was limited by quality of the video and audio technology used for the visit.  Patient was unable to connect via the computer so this is a telephone visit.  This is a telephone virtual visit Patient location: home Provider location: office  I discussed the limitations of evaluation and management by telemedicine and the availability of in person appointments. The patient expressed understanding and agreed to proceed.  Patient: Jesse Zimmerman   DOB: [birth year]   60 y.o. Male  MRN: [id number] Visit Date: 10/22/2020  Today's healthcare provider: Wilhemena Durie, MD   No chief complaint on file.  Subjective      HPI  Patient retired 2 weeks ago.  He was mowing his lawn a few days ago when he had a lot of pollen and dust skin in his lungs and he has been coughing ever since then.  He also has developed right ear pain and drainage.  The bigger issue that concerns me is that he has a boil in the vicinity of his left groin which finally burst and is draining but is tender and patient is worried about this becoming sepsis that he had several years ago. He has had all COVID vaccines and has had no COVID exposure.    Medications: Outpatient Medications Prior to Visit  Medication Sig  . albuterol (VENTOLIN HFA) 108 (90 Base) MCG/ACT inhaler Inhale 2 puffs into the lungs every 6 (six) hours as needed for wheezing or shortness of breath.  . blood glucose meter kit and supplies Dispense based on patient and insurance preference. Use once daily  as directed. (FOR ICD-10 E11.69).  . Dulaglutide (TRULICITY) 1.88 CZ/6.6AY SOPN Inject 0.75 mg into the skin once a week.  . fluticasone (FLONASE) 50 MCG/ACT nasal spray Place 2 sprays into both nostrils daily.  . furosemide (LASIX) 20 MG tablet Take 2 tablets (40 mg total) by mouth daily.  . ondansetron (ZOFRAN ODT) 4 MG disintegrating tablet Take 1 tablet (4 mg total) by mouth every 8 (eight) hours as needed for nausea or vomiting.  . promethazine (PHENERGAN) 25 MG tablet Take 1 tablet (25 mg total) by mouth every 8 (eight) hours as needed for nausea or vomiting.  . propranolol (INDERAL) 20 MG tablet Take 1 tablet (20 mg total) by mouth 2 (two) times daily.  Marland Kitchen spironolactone (ALDACTONE) 100 MG tablet TAKE 50 MG BY MOUTH TWICE  DAILY FOR 1 WEEK AND THEN  INCREASE TO 100 MG TWICE  DAILY  . tamsulosin (FLOMAX) 0.4 MG CAPS capsule Take 1 capsule (0.4 mg total) by mouth daily.  . traMADol (ULTRAM) 50 MG tablet Take 1 to 2 tablets every 8 hours and as needed for pain  . ursodiol (ACTIGALL) 300 MG capsule Take 300 mg by mouth 2 (two) times daily.    No facility-administered medications prior to visit.    Review of Systems     Objective    There were no vitals taken for this visit.    Physical Exam  Patient is alert and cooperative and in no distress and  breathing fine during the telephone visit   Assessment & Plan     1. Boil of groin This is just started draining so that should help.  Encouraged warm compresses and will start Augmentin on the patient.  I Minna see him back in a few days.  2. Non-recurrent acute serous otitis media, unspecified laterality Augmentin as above.  3. Diabetes mellitus type 2 in obese [facility]) Check lab work next week.  Also patient very fatigued due to cirrhosis of the - Empagliflozin-metFORMIN HCl ER (SYNJARDY XR) 03-999 MG TB24; Take 1 tablet by mouth daily.  Dispense: 90 tablet; Refill: 3  4. Nausea  - promethazine (PHENERGAN) 25 MG tablet; Take 1  tablet (25 mg total) by mouth every 8 (eight) hours as needed for nausea or vomiting.  Dispense: 60 tablet; Refill: 1  5. Cough Refill albuterol - albuterol (VENTOLIN HFA) 108 (90 Base) MCG/ACT inhaler; Inhale 2 puffs into the lungs every 6 (six) hours as needed for wheezing or shortness of breath.  Dispense: 18 g; Refill: 1  6. Seasonal allergic rhinitis due to pollen Add Zyrtec daily.  Told him to use his Flonase daily also.   No follow-ups on file.     I discussed the assessment and treatment plan with the patient. The patient was provided an opportunity to ask questions and all were answered. The patient agreed with the plan and demonstrated an understanding of the instructions.   The patient was advised to call back or seek an in-person evaluation if the symptoms worsen or if the condition fails to improve as anticipated.  I provided 11  minutes of non-face-to-face time during this encounter.  I, Wilhemena Durie, MD, have reviewed all documentation for this visit. The documentation on 10/22/20 for the exam, diagnosis, procedures, and orders are all accurate and complete.   Jannessa Ogden Cranford Mon, MD [facility] [phone] (phone) [phone] (fax)  [place]

## 2020-10-27 ENCOUNTER — Other Ambulatory Visit: Payer: Self-pay

## 2020-10-27 ENCOUNTER — Encounter: Payer: Self-pay | Admitting: Family Medicine

## 2020-10-27 ENCOUNTER — Ambulatory Visit: Payer: BC Managed Care – PPO | Admitting: Family Medicine

## 2020-10-27 VITALS — BP 121/71 | HR 62 | Temp 97.8°F | Resp 16 | Ht 72.0 in | Wt 293.0 lb

## 2020-10-27 DIAGNOSIS — R188 Other ascites: Secondary | ICD-10-CM

## 2020-10-27 DIAGNOSIS — F419 Anxiety disorder, unspecified: Secondary | ICD-10-CM | POA: Diagnosis not present

## 2020-10-27 DIAGNOSIS — K7460 Unspecified cirrhosis of liver: Secondary | ICD-10-CM | POA: Diagnosis not present

## 2020-10-27 DIAGNOSIS — E669 Obesity, unspecified: Secondary | ICD-10-CM | POA: Diagnosis not present

## 2020-10-27 DIAGNOSIS — I10 Essential (primary) hypertension: Secondary | ICD-10-CM | POA: Diagnosis not present

## 2020-10-27 DIAGNOSIS — E1169 Type 2 diabetes mellitus with other specified complication: Secondary | ICD-10-CM | POA: Diagnosis not present

## 2020-10-27 DIAGNOSIS — R5383 Other fatigue: Secondary | ICD-10-CM

## 2020-10-27 DIAGNOSIS — E782 Mixed hyperlipidemia: Secondary | ICD-10-CM

## 2020-10-27 DIAGNOSIS — L02214 Cutaneous abscess of groin: Secondary | ICD-10-CM

## 2020-10-27 MED ORDER — SERTRALINE HCL 50 MG PO TABS: 50.0000 mg | ORAL_TABLET | Freq: Every day | ORAL | 11 refills | Status: DC

## 2020-10-27 MED ORDER — SYNJARDY XR 10-1000 MG PO TB24: 1.0000 | ORAL_TABLET | Freq: Every day | ORAL | 3 refills | Status: DC

## 2020-10-27 MED ORDER — LORAZEPAM 0.5 MG PO TABS: 0.5000 mg | ORAL_TABLET | Freq: Three times a day (TID) | ORAL | 0 refills | Status: DC

## 2020-10-27 NOTE — Patient Instructions (Signed)
Do Sitz Bath at least daily.

## 2020-10-27 NOTE — Progress Notes (Signed)
Established patient visit   Patient: Jesse Zimmerman   DOB: Feb 09, 1961   60 y.o. Male  MRN: [id number] Visit Date: 10/27/2020  Today's healthcare provider: Wilhemena Durie, MD   Chief Complaint  Patient presents with  . Boil on leg   Subjective    HPI  Pt  comes in today for follow up.He is feeling better on antibiotics and his groin abscess is now draining. He is now officially retired.  He does have chronic fatigue and thinks is due to his cirrhosis. Since his retirement he feels a little depressed with some anhedonia. Follow up for boil on leg  The patient was last seen for this 1 weeks ago. Changes made at last visit include start Augmentin.  He reports good compliance with treatment. He feels that condition is Improved. He is not having side effects.        Medications: Outpatient Medications Prior to Visit  Medication Sig  . albuterol (VENTOLIN HFA) 108 (90 Base) MCG/ACT inhaler Inhale 2 puffs into the lungs every 6 (six) hours as needed for wheezing or shortness of breath.  Marland Kitchen albuterol (VENTOLIN HFA) 108 (90 Base) MCG/ACT inhaler Inhale 2 puffs into the lungs every 6 (six) hours as needed for wheezing or shortness of breath.  Marland Kitchen amoxicillin-clavulanate (AUGMENTIN) 875-125 MG tablet Take 1 tablet by mouth 2 (two) times daily.  . blood glucose meter kit and supplies Dispense based on patient and insurance preference. Use once daily as directed. (FOR ICD-10 E11.69).  . cetirizine (ZYRTEC) 10 MG tablet Take 1 tablet (10 mg total) by mouth daily.  . Dulaglutide (TRULICITY) 8.27 MB/8.6LJ SOPN Inject 0.75 mg into the skin once a week.  . fluticasone (FLONASE) 50 MCG/ACT nasal spray Place 2 sprays into both nostrils daily.  . furosemide (LASIX) 20 MG tablet Take 2 tablets (40 mg total) by mouth daily.  . ondansetron (ZOFRAN ODT) 4 MG disintegrating tablet Take 1 tablet (4 mg total) by mouth every 8 (eight) hours as needed for nausea or vomiting. (Patient not taking:  Reported on 10/22/2020)  . promethazine (PHENERGAN) 25 MG tablet Take 1 tablet (25 mg total) by mouth every 8 (eight) hours as needed for nausea or vomiting.  . propranolol (INDERAL) 20 MG tablet Take 1 tablet (20 mg total) by mouth 2 (two) times daily.  Marland Kitchen spironolactone (ALDACTONE) 100 MG tablet TAKE 50 MG BY MOUTH TWICE  DAILY FOR 1 WEEK AND THEN  INCREASE TO 100 MG TWICE  DAILY  . tamsulosin (FLOMAX) 0.4 MG CAPS capsule Take 1 capsule (0.4 mg total) by mouth daily.  . traMADol (ULTRAM) 50 MG tablet Take 1 to 2 tablets every 8 hours and as needed for pain (Patient not taking: Reported on 10/22/2020)  . ursodiol (ACTIGALL) 300 MG capsule Take 300 mg by mouth 2 (two) times daily.  (Patient not taking: Reported on 10/22/2020)  . [DISCONTINUED] Empagliflozin-metFORMIN HCl ER (SYNJARDY XR) 03-999 MG TB24 Take 1 tablet by mouth daily.   No facility-administered medications prior to visit.    Review of Systems  Constitutional: Negative for appetite change, chills and fever.  Respiratory: Negative for chest tightness, shortness of breath and wheezing.   Cardiovascular: Negative for chest pain and palpitations.  Gastrointestinal: Negative for abdominal pain, nausea and vomiting.        Objective    BP 121/71   Pulse 62   Temp 97.8 F (36.6 C)   Resp 16   Ht 6' (1.829 m)  Wt 293 lb (132.9 kg)   BMI 39.74 kg/m  BP Readings from Last 3 Encounters:  10/27/20 121/71  04/09/20 (!) 109/43  01/31/20 111/65   Wt Readings from Last 3 Encounters:  10/27/20 293 lb (132.9 kg)  04/09/20 292 lb (132.5 kg)  01/31/20 (!) 316 lb (143.3 kg)       Physical Exam Vitals reviewed.  Constitutional:      Appearance: Normal appearance. He is obese.  HENT:     Head: Normocephalic and atraumatic.     Right Ear: External ear normal.     Left Ear: External ear normal.     Nose: Nose normal.     Mouth/Throat:     Pharynx: Oropharynx is clear.  Eyes:     General: No scleral icterus.     Conjunctiva/sclera: Conjunctivae normal.  Neck:     Comments: No JVD noted. Cardiovascular:     Rate and Rhythm: Normal rate and regular rhythm.     Pulses: Normal pulses.     Heart sounds: Normal heart sounds.  Pulmonary:     Effort: Pulmonary effort is normal.  Abdominal:     Palpations: Abdomen is soft.     Tenderness: There is no abdominal tenderness.  Musculoskeletal:     Comments: 1+  edema.  Skin:    General: Skin is warm and dry.     Comments: Abscess in the left groin that is draining actively.  Is mildly tender with minimal erythema  Neurological:     General: No focal deficit present.     Mental Status: He is alert and oriented to person, place, and time. Mental status is at baseline.  Psychiatric:        Mood and Affect: Mood normal.        Behavior: Behavior normal.        Thought Content: Thought content normal.        Judgment: Judgment normal.      Depression screen [facility] 2/9 10/27/2020 11/08/2019 10/19/2018 06/30/2018 05/25/2017  Decreased Interest 2 0 1 0 1  Down, Depressed, Hopeless 1 0 0 0 0  PHQ - 2 Score 3 0 1 0 1  Altered sleeping 3 2 0 - 0  Tired, decreased energy _0 - 3  Change in appetite 1 1 0 - 1  Feeling bad or failure about yourself  0 0 0 - 0  Trouble concentrating 0 0 0 - 0  Moving slowly or fidgety/restless 0 0 0 - 0  Suicidal thoughts 0 0 0 - 0  PHQ-9 Score _1 - 5  Difficult doing work/chores Somewhat difficult Somewhat difficult Not difficult at all - Not difficult at all   GAD 7 : Generalized Anxiety Score 10/27/2020  Nervous, Anxious, on Edge 2  Control/stop worrying 2  Worry too much - different things 1  Trouble relaxing 2  Restless 1  Easily annoyed or irritable 2  Afraid - awful might happen 1  Total GAD 7 Score 11  Anxiety Difficulty Somewhat difficult     No results found for any visits on 10/27/20.  Assessment & Plan     1. Anxiety Started sertraline 50 mg and Lorazepam 0.5 mg q8 prn.  - sertraline (ZOLOFT) 50 MG  tablet; Take 1 tablet (50 mg total) by mouth daily.  Dispense: 30 tablet; Refill: 11 - LORazepam (ATIVAN) 0.5 MG tablet; Take 1 tablet (0.5 mg total) by mouth every 8 (eight) hours.  Dispense: 30 tablet; Refill: 0 -  CBC w/Diff/Platelet - Comprehensive Metabolic Panel (CMET) - Hemoglobin A1c - TSH - Lipid panel  2. Diabetes mellitus type 2 in obese (HCC) A1c less than 7.5. - Empagliflozin-metFORMIN HCl ER (SYNJARDY XR) 03-999 MG TB24; Take 1 tablet by mouth daily.  Dispense: 90 tablet; Refill: 3 - CBC w/Diff/Platelet - Comprehensive Metabolic Panel (CMET) - Hemoglobin A1c - TSH - Lipid panel  3. Essential hypertension  - CBC w/Diff/Platelet - Comprehensive Metabolic Panel (CMET) - Hemoglobin A1c - TSH - Lipid panel  4. Cirrhosis of liver with ascites, unspecified hepatic cirrhosis type ([place]) Obtain labs. - CBC w/Diff/Platelet - Comprehensive Metabolic Panel (CMET) - Hemoglobin A1c - TSH - Lipid panel  5. Fatigue, unspecified type Factorial.  Some due to depression most due to cirrhosis - CBC w/Diff/Platelet - Comprehensive Metabolic Panel (CMET) - Hemoglobin A1c - TSH - Lipid panel  6. Mixed hyperlipidemia  - CBC w/Diff/Platelet - Comprehensive Metabolic Panel (CMET) - Hemoglobin A1c - TSH - Lipid panel 7.Groin Abscess Sitz bath's please daily and follow-up in a few weeks.  It is actively draining.  Return in about 1 month (around 11/27/2020).      I, Wilhemena Durie, MD, have reviewed all documentation for this visit. The documentation on 10/31/20 for the exam, diagnosis, procedures, and orders are all accurate and complete.    Donnell Beauchamp Cranford Mon, MD  [facility] [phone] (phone) [phone] (fax)  [place]

## 2020-10-28 LAB — COMPREHENSIVE METABOLIC PANEL
ALT: 21 IU/L (ref 0–44)
AST: 39 IU/L (ref 0–40)
Albumin/Globulin Ratio: 0.8 — ABNORMAL LOW (ref 1.2–2.2)
Albumin: 2.9 g/dL — ABNORMAL LOW (ref 3.8–4.9)
Alkaline Phosphatase: 103 IU/L (ref 44–121)
BUN/Creatinine Ratio: 12 (ref 9–20)
BUN: 20 mg/dL (ref 6–24)
Bilirubin Total: 2 mg/dL — ABNORMAL HIGH (ref 0.0–1.2)
CO2: 21 mmol/L (ref 20–29)
Calcium: 8.4 mg/dL — ABNORMAL LOW (ref 8.7–10.2)
Chloride: 105 mmol/L (ref 96–106)
Creatinine, Ser: 1.67 mg/dL — ABNORMAL HIGH (ref 0.76–1.27)
Globulin, Total: 3.5 g/dL (ref 1.5–4.5)
Glucose: 160 mg/dL — ABNORMAL HIGH (ref 65–99)
Potassium: 4.2 mmol/L (ref 3.5–5.2)
Sodium: 138 mmol/L (ref 134–144)
Total Protein: 6.4 g/dL (ref 6.0–8.5)
eGFR: 47 mL/min/{1.73_m2} — ABNORMAL LOW (ref >59)

## 2020-10-28 LAB — CBC WITH DIFFERENTIAL/PLATELET
Basophils Absolute: 0.1 10*3/uL (ref 0.0–0.2)
Basos: 1 %
EOS (ABSOLUTE): 0.5 10*3/uL — ABNORMAL HIGH (ref 0.0–0.4)
Eos: 7 %
Hematocrit: 25.1 % — ABNORMAL LOW (ref 37.5–51.0)
Hemoglobin: 9 g/dL — ABNORMAL LOW (ref 13.0–17.7)
Immature Grans (Abs): 0 10*3/uL (ref 0.0–0.1)
Immature Granulocytes: 1 %
Lymphocytes Absolute: 1 10*3/uL (ref 0.7–3.1)
Lymphs: 16 %
MCH: 34.4 pg — ABNORMAL HIGH (ref 26.6–33.0)
MCHC: 35.9 g/dL — ABNORMAL HIGH (ref 31.5–35.7)
MCV: 96 fL (ref 79–97)
Monocytes Absolute: 0.3 10*3/uL (ref 0.1–0.9)
Monocytes: 5 %
Neutrophils Absolute: 4.4 10*3/uL (ref 1.4–7.0)
Neutrophils: 70 %
Platelets: 56 10*3/uL — CL (ref 150–450)
RBC: 2.62 x10E6/uL — CL (ref 4.14–5.80)
RDW: 14.4 % (ref 11.6–15.4)
WBC: 6.4 10*3/uL (ref 3.4–10.8)

## 2020-10-28 LAB — LIPID PANEL
Chol/HDL Ratio: 2.3 ratio (ref 0.0–5.0)
Cholesterol, Total: 89 mg/dL — ABNORMAL LOW (ref 100–199)
HDL: 38 mg/dL — ABNORMAL LOW (ref >39)
LDL Chol Calc (NIH): 37 mg/dL (ref 0–99)
Triglycerides: 63 mg/dL (ref 0–149)
VLDL Cholesterol Cal: 14 mg/dL (ref 5–40)

## 2020-10-28 LAB — HEMOGLOBIN A1C
Est. average glucose Bld gHb Est-mCnc: 163 mg/dL
Hgb A1c MFr Bld: 7.3 % — ABNORMAL HIGH (ref 4.8–5.6)

## 2020-10-28 LAB — TSH: TSH: 2.23 u[IU]/mL (ref 0.450–4.500)

## 2020-10-30 ENCOUNTER — Telehealth: Payer: Self-pay | Admitting: Family Medicine

## 2020-10-30 NOTE — Telephone Encounter (Signed)
Please advise 

## 2020-10-30 NOTE — Telephone Encounter (Signed)
Pt states the  Empagliflozin-metFORMIN HCl ER (SYNJARDY XR) 03-999 MG TB24  Needs a prior auth.  Pt states went to pick up and it was $795.00. Pt states OK to switch him to somthig else, but he needs something else asap if they cannot get this price down.  Jesse Zimmerman, Jesse Zimmerman

## 2020-10-31 NOTE — Telephone Encounter (Signed)
Please give him samples and we can look at it next week.

## 2020-11-03 NOTE — Telephone Encounter (Signed)
PA was done on 10/31/20. Patient and pharmacy was advised of approval.

## 2020-11-27 ENCOUNTER — Ambulatory Visit: Payer: Self-pay | Admitting: Family Medicine

## 2020-12-04 ENCOUNTER — Ambulatory Visit: Payer: Self-pay | Admitting: Family Medicine

## 2020-12-15 ENCOUNTER — Ambulatory Visit: Payer: Self-pay | Admitting: Family Medicine

## 2021-01-09 NOTE — Progress Notes (Signed)
I,April Miller,acting as a scribe for Wilhemena Durie, MD.,have documented all relevant documentation on the behalf of Wilhemena Durie, MD,as directed by  Wilhemena Durie, MD while in the presence of Wilhemena Durie, MD.   Established patient visit   Patient: Jesse Zimmerman   DOB: 10/29/1960   60 y.o. Male  MRN: [id number] Visit Date: 01/12/2021  Today's healthcare provider: Wilhemena Durie, MD   Chief Complaint  Patient presents with   Follow-up   Anxiety   Subjective    HPI  Patient comes in today for follow-up.  Synjardy caused some diarrhea and I assume it was the metformin and the Lipan.  Stopping it however he has gained a good bit of weight.  He thinks he needs to go to GI and get a paracentesis. He is now completely retired. This is weight gain he does have some snoring and possible apneic spells. PHQ-9 today is a 5 with fatigue being the main issue. Anxiety, Follow-up  He was last seen for anxiety 2 months ago. Changes made at last visit include starting sertraline 50 mg and Lorazepam 0.5 mg q8 prn.    He reports poor compliance with treatment. He reports fair to poor tolerance of treatment. He is not having side effects. N/a  He feels his anxiety is n/a and  n/a  since last visit.  Patient need not take sertraline or lorazepam. Patient states he has taken 1/2 tablet of sertraline once.   GAD-7 Results GAD-7 Generalized Anxiety Disorder Screening Tool 10/27/2020  1. Feeling Nervous, Anxious, or on Edge 2  2. Not Being Able to Stop or Control Worrying 2  3. Worrying Too Much About Different Things 1  4. Trouble Relaxing 2  5. Being So Restless it's Hard To Sit Still 1  6. Becoming Easily Annoyed or Irritable 2  7. Feeling Afraid As If Something Awful Might Happen 1  Total GAD-7 Score 11  Difficulty At Work, Home, or Getting  Along With Others? Somewhat difficult    PHQ-9 Scores PHQ9 SCORE ONLY 01/12/2021 10/27/2020 11/08/2019  PHQ-9 Total  Score 5 8 6     ----------------------------------------------------------------------------       Medications: Outpatient Medications Prior to Visit  Medication Sig   albuterol (VENTOLIN HFA) 108 (90 Base) MCG/ACT inhaler Inhale 2 puffs into the lungs every 6 (six) hours as needed for wheezing or shortness of breath.   albuterol (VENTOLIN HFA) 108 (90 Base) MCG/ACT inhaler Inhale 2 puffs into the lungs every 6 (six) hours as needed for wheezing or shortness of breath.   amoxicillin-clavulanate (AUGMENTIN) 875-125 MG tablet Take 1 tablet by mouth 2 (two) times daily.   blood glucose meter kit and supplies Dispense based on patient and insurance preference. Use once daily as directed. (FOR ICD-10 E11.69).   cetirizine (ZYRTEC) 10 MG tablet Take 1 tablet (10 mg total) by mouth daily.   Dulaglutide (TRULICITY) 4.16 LA/4.5XM SOPN Inject 0.75 mg into the skin once a week.   Dulaglutide (TRULICITY) 4.68 EH/2.1YY SOPN Inject into the skin.   Empagliflozin-metFORMIN HCl ER (SYNJARDY XR) 03-999 MG TB24 Take 1 tablet by mouth daily.   fluticasone (FLONASE) 50 MCG/ACT nasal spray Place 2 sprays into both nostrils daily.   furosemide (LASIX) 20 MG tablet Take 2 tablets (40 mg total) by mouth daily.   promethazine (PHENERGAN) 25 MG tablet Take 1 tablet (25 mg total) by mouth every 8 (eight) hours as needed for nausea or vomiting.   propranolol (  INDERAL) 20 MG tablet Take 1 tablet (20 mg total) by mouth 2 (two) times daily.   spironolactone (ALDACTONE) 100 MG tablet TAKE 50 MG BY MOUTH TWICE  DAILY FOR 1 WEEK AND THEN  INCREASE TO 100 MG TWICE  DAILY   tamsulosin (FLOMAX) 0.4 MG CAPS capsule Take 1 capsule (0.4 mg total) by mouth daily.   ursodiol (ACTIGALL) 300 MG capsule Take 300 mg by mouth 2 (two) times daily.   LORazepam (ATIVAN) 0.5 MG tablet Take 1 tablet (0.5 mg total) by mouth every 8 (eight) hours. (Patient not taking: Reported on 01/12/2021)   ondansetron (ZOFRAN ODT) 4 MG disintegrating  tablet Take 1 tablet (4 mg total) by mouth every 8 (eight) hours as needed for nausea or vomiting. (Patient not taking: No sig reported)   sertraline (ZOLOFT) 50 MG tablet Take 1 tablet (50 mg total) by mouth daily. (Patient not taking: Reported on 01/12/2021)   traMADol (ULTRAM) 50 MG tablet Take 1 to 2 tablets every 8 hours and as needed for pain (Patient not taking: No sig reported)   No facility-administered medications prior to visit.    Review of Systems      Objective    BP 122/67 (BP Location: Right Arm, Patient Position: Sitting, Cuff Size: Large)   Pulse 60   Resp 18   Ht 6' (1.829 m)   Wt (!) 317 lb (143.8 kg)   SpO2 98%   BMI 42.99 kg/m  BP Readings from Last 3 Encounters:  01/12/21 122/67  10/27/20 121/71  04/09/20 (!) 109/43   Wt Readings from Last 3 Encounters:  01/12/21 (!) 317 lb (143.8 kg)  10/27/20 293 lb (132.9 kg)  04/09/20 292 lb (132.5 kg)       Physical Exam Vitals reviewed.  Constitutional:      Appearance: Normal appearance. He is obese.  HENT:     Head: Normocephalic and atraumatic.     Right Ear: External ear normal.     Left Ear: External ear normal.     Nose: Nose normal.     Mouth/Throat:     Pharynx: Oropharynx is clear.  Eyes:     General: No scleral icterus.    Conjunctiva/sclera: Conjunctivae normal.  Neck:     Comments: No JVD noted. Cardiovascular:     Rate and Rhythm: Normal rate and regular rhythm.     Pulses: Normal pulses.     Heart sounds: Normal heart sounds.  Pulmonary:     Effort: Pulmonary effort is normal.  Abdominal:     Palpations: Abdomen is soft.     Tenderness: There is no abdominal tenderness.  Musculoskeletal:     Comments: 1+  edema.  Skin:    General: Skin is warm and dry.  Neurological:     General: No focal deficit present.     Mental Status: He is alert and oriented to person, place, and time. Mental status is at baseline.  Psychiatric:        Mood and Affect: Mood normal.        Behavior:  Behavior normal.        Thought Content: Thought content normal.        Judgment: Judgment normal.      No results found for any visits on 01/12/21.  Assessment & Plan     1. Diabetes mellitus type 2 in obese ([place]) Restart Jardiance as I think the metformin because of the diarrhea not Jardiance.  He should diurese with this. -  empagliflozin (JARDIANCE) 25 MG TABS tablet; Take 1 tablet (25 mg total) by mouth daily.  Dispense: 90 tablet; Refill: 1  2. Essential hypertension Good blood pressure control  3. Anxiety Stable problem on sertraline  4. Seasonal allergic rhinitis due to pollen  - loratadine (CLARITIN) 10 MG tablet; Take 1 tablet (10 mg total) by mouth daily.  Dispense: 30 tablet; Refill: 11  5. OSA (obstructive sleep apnea) Patient has probably developed sleep apnea with a weight gain.  Get home sleep study. - Ambulatory referral to Pulmonology  6. Cirrhosis of liver with ascites, unspecified hepatic cirrhosis type ([place]) Followed at [place].  May need paracentesis but hopefully the Jardiance will help.  May also need to adjust spironolactone dosing  7. Portal hypertension (HCC) Followed at the  8. Class 3 severe obesity due to excess calories with serious comorbidity and body mass index (BMI) of 40.0 to 44.9 in adult (HCC) Slow diet and exercise weight loss is stressed   Return in about 1 month (around 02/12/2021).      I, Wilhemena Durie, MD, have reviewed all documentation for this visit. The documentation on 01/14/21 for the exam, diagnosis, procedures, and orders are all accurate and complete.    Mistina Coatney Cranford Mon, MD  [facility] [phone] (phone) [phone] (fax)  [place]

## 2021-01-12 ENCOUNTER — Ambulatory Visit (INDEPENDENT_AMBULATORY_CARE_PROVIDER_SITE_OTHER): Payer: BC Managed Care – PPO | Admitting: Family Medicine

## 2021-01-12 ENCOUNTER — Encounter: Payer: Self-pay | Admitting: Family Medicine

## 2021-01-12 ENCOUNTER — Other Ambulatory Visit: Payer: Self-pay

## 2021-01-12 VITALS — BP 122/67 | HR 60 | Resp 18 | Ht 72.0 in | Wt 317.0 lb

## 2021-01-12 DIAGNOSIS — E669 Obesity, unspecified: Secondary | ICD-10-CM

## 2021-01-12 DIAGNOSIS — E1169 Type 2 diabetes mellitus with other specified complication: Secondary | ICD-10-CM | POA: Diagnosis not present

## 2021-01-12 DIAGNOSIS — R188 Other ascites: Secondary | ICD-10-CM

## 2021-01-12 DIAGNOSIS — J301 Allergic rhinitis due to pollen: Secondary | ICD-10-CM

## 2021-01-12 DIAGNOSIS — G4733 Obstructive sleep apnea (adult) (pediatric): Secondary | ICD-10-CM

## 2021-01-12 DIAGNOSIS — I10 Essential (primary) hypertension: Secondary | ICD-10-CM | POA: Diagnosis not present

## 2021-01-12 DIAGNOSIS — K7460 Unspecified cirrhosis of liver: Secondary | ICD-10-CM

## 2021-01-12 DIAGNOSIS — K766 Portal hypertension: Secondary | ICD-10-CM

## 2021-01-12 DIAGNOSIS — Z6841 Body Mass Index (BMI) 40.0 and over, adult: Secondary | ICD-10-CM

## 2021-01-12 DIAGNOSIS — F419 Anxiety disorder, unspecified: Secondary | ICD-10-CM | POA: Diagnosis not present

## 2021-01-12 MED ORDER — LORATADINE 10 MG PO TABS: 10.0000 mg | ORAL_TABLET | Freq: Every day | ORAL | 11 refills | Status: DC

## 2021-01-12 MED ORDER — EMPAGLIFLOZIN 25 MG PO TABS: 25.0000 mg | ORAL_TABLET | Freq: Every day | ORAL | 1 refills | Status: DC

## 2021-01-13 ENCOUNTER — Other Ambulatory Visit: Payer: Self-pay | Admitting: Family Medicine

## 2021-01-13 DIAGNOSIS — M7989 Other specified soft tissue disorders: Secondary | ICD-10-CM

## 2021-01-13 NOTE — Telephone Encounter (Signed)
Requested Prescriptions  Pending Prescriptions Disp Refills  . spironolactone (ALDACTONE) 100 MG tablet [Pharmacy Med Name: SPIRONOLACTONE  100MG  TAB] 180 tablet 0    Sig: TAKE ONE-HALF TABLET BY  MOUTH TWICE DAILY FOR 1  WEEK AND THEN INCREASE TO 1 TABLET TWICE DAILY     Cardiovascular: Diuretics - Aldosterone Antagonist Failed - 01/13/2021  7:51 PM      Failed - Cr in normal range and within 360 days    Creatinine  Date Value Ref Range Status  06/11/2013 1.14 0.60 - 1.30 mg/dL Final   Creatinine, Ser  Date Value Ref Range Status  10/27/2020 1.67 (H) 0.76 - 1.27 mg/dL Final         Passed - K in normal range and within 360 days    Potassium  Date Value Ref Range Status  10/27/2020 4.2 3.5 - 5.2 mmol/L Final  06/11/2013 4.1 3.5 - 5.1 mmol/L Final         Passed - Na in normal range and within 360 days    Sodium  Date Value Ref Range Status  10/27/2020 138 134 - 144 mmol/L Final  06/11/2013 133 (L) 136 - 145 mmol/L Final         Passed - Last BP in normal range    BP Readings from Last 1 Encounters:  01/12/21 122/67         Passed - Valid encounter within last 6 months    Recent Outpatient Visits          Yesterday Diabetes mellitus type 2 in obese [facility])   [facility] Jerrol Banana., MD   2 months ago [place] Jerrol Banana., MD   2 months ago Boil of groin   [facility] Jerrol Banana., MD   9 months ago Diabetes mellitus type 2 in obese [facility])   [facility] Jerrol Banana., MD   11 months ago Swelling of lower leg   [facility] Jerrol Banana., MD      Future Appointments            In 1 month Jerrol Banana., MD [facility]

## 2021-01-20 DIAGNOSIS — K7290 Hepatic failure, unspecified without coma: Secondary | ICD-10-CM | POA: Diagnosis not present

## 2021-01-20 DIAGNOSIS — K7460 Unspecified cirrhosis of liver: Secondary | ICD-10-CM | POA: Diagnosis not present

## 2021-01-20 DIAGNOSIS — K766 Portal hypertension: Secondary | ICD-10-CM | POA: Diagnosis not present

## 2021-01-20 DIAGNOSIS — K7581 Nonalcoholic steatohepatitis (NASH): Secondary | ICD-10-CM | POA: Diagnosis not present

## 2021-01-23 DIAGNOSIS — K766 Portal hypertension: Secondary | ICD-10-CM | POA: Diagnosis not present

## 2021-01-23 DIAGNOSIS — I8510 Secondary esophageal varices without bleeding: Secondary | ICD-10-CM | POA: Diagnosis not present

## 2021-01-23 DIAGNOSIS — I8500 Esophageal varices without bleeding: Secondary | ICD-10-CM | POA: Diagnosis not present

## 2021-01-23 DIAGNOSIS — I864 Gastric varices: Secondary | ICD-10-CM | POA: Diagnosis not present

## 2021-01-23 DIAGNOSIS — K7581 Nonalcoholic steatohepatitis (NASH): Secondary | ICD-10-CM | POA: Diagnosis not present

## 2021-01-23 DIAGNOSIS — K7460 Unspecified cirrhosis of liver: Secondary | ICD-10-CM | POA: Diagnosis not present

## 2021-02-02 ENCOUNTER — Emergency Department: Payer: BC Managed Care – PPO

## 2021-02-02 ENCOUNTER — Telehealth: Payer: Self-pay | Admitting: Family Medicine

## 2021-02-02 ENCOUNTER — Emergency Department
Admission: EM | Admit: 2021-02-02 | Discharge: 2021-02-02 | Disposition: A | Discharge status: Home / Self Care | Payer: BC Managed Care – PPO | Attending: Emergency Medicine | Admitting: Emergency Medicine

## 2021-02-02 ENCOUNTER — Other Ambulatory Visit: Payer: Self-pay

## 2021-02-02 ENCOUNTER — Ambulatory Visit: Payer: Self-pay | Admitting: *Deleted

## 2021-02-02 DIAGNOSIS — J811 Chronic pulmonary edema: Secondary | ICD-10-CM | POA: Diagnosis not present

## 2021-02-02 DIAGNOSIS — Z20822 Contact with and (suspected) exposure to covid-19: Secondary | ICD-10-CM | POA: Insufficient documentation

## 2021-02-02 DIAGNOSIS — M7989 Other specified soft tissue disorders: Secondary | ICD-10-CM | POA: Diagnosis not present

## 2021-02-02 DIAGNOSIS — N200 Calculus of kidney: Secondary | ICD-10-CM | POA: Diagnosis not present

## 2021-02-02 DIAGNOSIS — R140 Abdominal distension (gaseous): Secondary | ICD-10-CM | POA: Diagnosis not present

## 2021-02-02 DIAGNOSIS — R0602 Shortness of breath: Secondary | ICD-10-CM | POA: Diagnosis not present

## 2021-02-02 DIAGNOSIS — R635 Abnormal weight gain: Secondary | ICD-10-CM | POA: Diagnosis not present

## 2021-02-02 DIAGNOSIS — R509 Fever, unspecified: Secondary | ICD-10-CM | POA: Diagnosis not present

## 2021-02-02 DIAGNOSIS — Z794 Long term (current) use of insulin: Secondary | ICD-10-CM | POA: Insufficient documentation

## 2021-02-02 DIAGNOSIS — I10 Essential (primary) hypertension: Secondary | ICD-10-CM | POA: Diagnosis not present

## 2021-02-02 DIAGNOSIS — R059 Cough, unspecified: Secondary | ICD-10-CM | POA: Diagnosis not present

## 2021-02-02 DIAGNOSIS — J9811 Atelectasis: Secondary | ICD-10-CM | POA: Diagnosis not present

## 2021-02-02 DIAGNOSIS — K8020 Calculus of gallbladder without cholecystitis without obstruction: Secondary | ICD-10-CM | POA: Diagnosis not present

## 2021-02-02 DIAGNOSIS — M79605 Pain in left leg: Secondary | ICD-10-CM | POA: Diagnosis not present

## 2021-02-02 DIAGNOSIS — R188 Other ascites: Secondary | ICD-10-CM | POA: Diagnosis not present

## 2021-02-02 DIAGNOSIS — R600 Localized edema: Secondary | ICD-10-CM | POA: Diagnosis not present

## 2021-02-02 DIAGNOSIS — M1712 Unilateral primary osteoarthritis, left knee: Secondary | ICD-10-CM | POA: Diagnosis not present

## 2021-02-02 DIAGNOSIS — Z91040 Latex allergy status: Secondary | ICD-10-CM | POA: Insufficient documentation

## 2021-02-02 DIAGNOSIS — E119 Type 2 diabetes mellitus without complications: Secondary | ICD-10-CM | POA: Diagnosis not present

## 2021-02-02 DIAGNOSIS — L03116 Cellulitis of left lower limb: Secondary | ICD-10-CM | POA: Diagnosis not present

## 2021-02-02 DIAGNOSIS — K7460 Unspecified cirrhosis of liver: Secondary | ICD-10-CM | POA: Diagnosis not present

## 2021-02-02 DIAGNOSIS — Z043 Encounter for examination and observation following other accident: Secondary | ICD-10-CM | POA: Diagnosis not present

## 2021-02-02 LAB — CBC
HCT: 21.8 % — ABNORMAL LOW (ref 39.0–52.0)
Hemoglobin: 8 g/dL — ABNORMAL LOW (ref 13.0–17.0)
MCH: 35.7 pg — ABNORMAL HIGH (ref 26.0–34.0)
MCHC: 36.7 g/dL — ABNORMAL HIGH (ref 30.0–36.0)
MCV: 97.3 fL (ref 80.0–100.0)
Platelets: 42 10*3/uL — ABNORMAL LOW (ref 150–400)
RBC: 2.24 MIL/uL — ABNORMAL LOW (ref 4.22–5.81)
RDW: 15.5 % (ref 11.5–15.5)
WBC: 3.9 10*3/uL — ABNORMAL LOW (ref 4.0–10.5)
nRBC: 0 % (ref 0.0–0.2)

## 2021-02-02 LAB — COMPREHENSIVE METABOLIC PANEL
ALT: 27 U/L (ref 0–44)
AST: 54 U/L — ABNORMAL HIGH (ref 15–41)
Albumin: 2.2 g/dL — ABNORMAL LOW (ref 3.5–5.0)
Alkaline Phosphatase: 76 U/L (ref 38–126)
Anion gap: 5 (ref 5–15)
BUN: 38 mg/dL — ABNORMAL HIGH (ref 6–20)
CO2: 24 mmol/L (ref 22–32)
Calcium: 8.2 mg/dL — ABNORMAL LOW (ref 8.9–10.3)
Chloride: 102 mmol/L (ref 98–111)
Creatinine, Ser: 1.94 mg/dL — ABNORMAL HIGH (ref 0.61–1.24)
GFR, Estimated: 39 mL/min — ABNORMAL LOW (ref >60)
Glucose, Bld: 134 mg/dL — ABNORMAL HIGH (ref 70–99)
Potassium: 3.9 mmol/L (ref 3.5–5.1)
Sodium: 131 mmol/L — ABNORMAL LOW (ref 135–145)
Total Bilirubin: 2.2 mg/dL — ABNORMAL HIGH (ref 0.3–1.2)
Total Protein: 6.3 g/dL — ABNORMAL LOW (ref 6.5–8.1)

## 2021-02-02 LAB — PROTIME-INR
INR: 1.6 — ABNORMAL HIGH (ref 0.8–1.2)
Prothrombin Time: 18.7 seconds — ABNORMAL HIGH (ref 11.4–15.2)

## 2021-02-02 LAB — RESP PANEL BY RT-PCR (FLU A&B, COVID) ARPGX2
Influenza A by PCR: NEGATIVE
Influenza B by PCR: NEGATIVE
SARS Coronavirus 2 by RT PCR: NEGATIVE

## 2021-02-02 LAB — LIPASE, BLOOD: Lipase: 193 U/L — ABNORMAL HIGH (ref 11–51)

## 2021-02-02 MED ORDER — ONDANSETRON 4 MG PO TBDP: 4.0000 mg | ORAL_TABLET | Freq: Three times a day (TID) | ORAL | 0 refills | Status: DC | PRN

## 2021-02-02 MED ORDER — CEPHALEXIN 500 MG PO CAPS: 500.0000 mg | ORAL_CAPSULE | Freq: Three times a day (TID) | ORAL | 0 refills | Status: AC

## 2021-02-02 NOTE — Telephone Encounter (Signed)
Pt called in c/o several issues.   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 Now.   He is c/o his hands and feet being very swollen with fluid.   His weight has gone from 278 lbs to 324 lbs.   He is scheduled for an endoscopy at [facility] on 02/09/2021. Friday night he started vomiting and vomited all weekend.   EMS came to his house and all his VS were fine.   Except he had a fever 102.  I'm not vomiting now but I'm wiped out feeling.   Very tired.   I'm sure my electrolytes are out of balance.   I'm drinking Boost and water and Gator Aid now to rehydrate. I called [facility] and spoke to them and they think I may have a slow internal bleed which is why I'm scheduled for the endoscopy on 02/09/2021.   They told me [facility] is full and that's the soonest they can get me in. I'm not peeing as much as I normally do over the last week.   I'm retaining so much fluid.   I feel like I need another paracentesis.   I've had 3 done so far.    My main question is, should I go on to the ED or try and let Dr. Rosanna Randy order all these tests, procedures, etc.    I think I should probably go to the ED.   I let him know I agreed with him he should go on to the ED.   So he is going to [facility].  I sent my notes to [facility] for Dr. Alben Spittle information.

## 2021-02-02 NOTE — Telephone Encounter (Signed)
Reason for Disposition  Patient sounds very sick or weak to the triager    Has non alcoholic liver failure.   Being seen at [facility] for this.  Answer Assessment - Initial Assessment Questions 1. ONSET: "When did the swelling start?" (e.g., minutes, hours, days)     I've had cirrhosis of liver with portal hypertension for 15 yrs.   I've started swelling with fluid like crazy.   I ws 278 lbs now 324 lbs.   At [facility] to have an endoscopy on 02/09/2021.   They told me [facility] is full. Friday night I became sick with vomiting for several days.  EMS came.  My temperature was up.  129/53 was my BP.                           My hemoglobin is down, WBC is down, RBC is down according to [facility].   [facility] thinks I have a slow internal leak.   That's the reason for the endo on the 02/09/2021 I'm not peeing.  I need this fluid drawn off so I can breath.   [facility] told me to call my primary doctor.   I also need a blood transfusion.   It helped last time I got a transfusion. I've been taking fluid pills for a yr now.   I'm so tired.   Can Dr. Rosanna Randy order all the tests I need today?   Should I go to ED?   I have a cough for 3 years now.     I did the Covid shot.   The 2nd shot I lost my hearing in my right ear.  I've lost 15% in my left ear.  I've been to several ENT drs.   They don't know why it happened after the shot. 2. LOCATION: "What part of the hand is swollen?"  "Are both hands swollen or just one hand?"     You are having swelling in both my hands and both feet.  My hands are so swollen I can't see my veins.  Normally my veins stick out.   This started a month ago.    I feel so bad.   Last time I vomited was yesterday.   I'm drinking Boost, [employer], and water.   I've not eaten much.   If I take a deep breath I cough.   I had 3 paracentesis for the fluid build up. I'm peeing less than is normal for me.  I'm getting up less at night over the last week to pee.    I was at [facility] last year.   I was retaining fluid so bad.     3. SEVERITY: "How bad is the swelling?" (e.g., localized; mild, moderate, severe)   - BALL OR LUMP: small ball or lump   - LOCALIZED: puffy or swollen area or patch of skin   - JOINT SWELLING: swelling of a joint   - MILD: puffiness or mild swelling of fingers or hand   - MODERATE: fingers and hand are swollen   - SEVERE: swelling of entire hand and up into forearm     Hands and feet are very swollen.   He also feels he needs another paracentesis.   4. REDNESS: "Does the swelling look red or infected?"     No just puffy with fluid in my feet and hands.   My weight is up too. 5. PAIN: "Is the swelling painful to touch?"  If Yes, ask: "How painful is it?"   (Scale 1-10; mild, moderate or severe)     Yes my feet are tender 6. FEVER: "Do you have a fever?" If Yes, ask: "What is it, how was it measured, and when did it start?"      Yes    7. CAUSE: "What do you think is causing the hand swelling?" (e.g., heat, insect bite, pregnancy, recent injury)     I have cirrhosis of the liver from non alcoholic liver failure. 8. MEDICAL HISTORY: "Do you have a history of heart failure, kidney disease, liver failure, or cancer?"     Liver failure from non alcoholic causes 9. RECURRENT SYMPTOM: "Have you had hand swelling before?" If Yes, ask: "When was the last time?" "What happened that time?"     Yes   I've had 3 paracentesis treatments done. 10. OTHER SYMPTOMS: "Do you have any other symptoms?" (e.g., blurred vision, difficulty breathing, headache)       A cough every time I deep breath.  My weight is up.   I'm very tired.   I feel like my hemoglobin is down and I need another blood transfusion.   I've had a blood transfusion before and it really helped me for about 3 months. 11. PREGNANCY: "Is there any chance you are pregnant?" "When was your last menstrual period?"       N/A  Protocols used: Hand Swelling-A-AH

## 2021-02-02 NOTE — ED Notes (Signed)
US at bedside

## 2021-02-02 NOTE — ED Triage Notes (Signed)
First Nurse Note:  Arrives with C/O fever, possible sepsis from left leg wound.  Has history of cirrhosis and states may be 'leaking blood'. C/O abdominal distention.  AAOx3.  Skin warm and dry. NAD

## 2021-02-02 NOTE — Telephone Encounter (Signed)
Pt wife is calling from ED to an order for paracentesis for fluid to be drained from his gut. Pt is getting ready to be released. Pt states that he cant do the paracentesis on 02/09/21. Please advise CB- [phone]

## 2021-02-02 NOTE — Discharge Instructions (Addendum)
As we discussed, you are being discharged with Keflex antibiotic to treat cellulitis to left leg.  Continue all your other medications, including your diuretics.    Follow-up closely with your PCP, Dr. Rosanna Randy.

## 2021-02-02 NOTE — ED Triage Notes (Addendum)
Pt here with nausea and weakness from doctor's office. Pt has an endo scheduled for next Monday. Pt has cirrhosis and has gained a lot of weight in 1 week. Pt left leg hurts and he feels like he has an infection in it. Pt also has a cough for 3 years. Pt states fever of 102 at home. Pt states that he has not been able to urinate normally since Thurs. Pt also states that he had a recent fall and hurt his left knee and has been having pain in his left thigh area for a while.

## 2021-02-02 NOTE — ED Provider Notes (Signed)
[facility] Emergency Department Provider Note ____________________________________________   Event Date/Time   First MD Initiated Contact with Patient 02/02/21 1120     (approximate)  I have reviewed the triage vital signs and the nursing notes.  HISTORY  Chief Complaint Fever and Nausea   HPI Jesse Zimmerman is a 60 y.o. malewho presents to the ED for evaluation of weakness, fever and nausea.  Chart review indicates history of longstanding [name] and associated portal hypertension and esophageal varices.  DM, HLD.  Patient presents to the ED, accompanied by his wife, for evaluation of about 2 weeks of symptoms.   They report 30 pounds of weight gain in the past 2 weeks with associated increasing abdominal distention and explicit concerns for ascites.  He denies any abdominal pain, but reports nausea and poor p.o. intake.  He does report having gallstones.   Further reports fevers last week of 102 F, for which they called EMS to get evaluated by paramedics on Friday, 3 days ago.  They report his fever broke in the past 1-2 days.  Reports nonproductive cough associated with this without increased sputum production.  Denies chest pain.  Further reports painful swelling, hardness and tenderness to the posterior aspect of his left thigh distally just proximal to his knee.  He denies any falls, traumas or injuries to this area.  Does report falling and striking his anterior knee a couple weeks ago, but has been ambulatory on it since that time.  Denies any history of DVT.  Denies symptoms to his right leg.  Does report seeing his hepatologist nurse practitioner 1 week ago where his diuretic dosing was cut in half.  They report outpatient blood work performed with creatinine of 1.9 at that time.  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   Pre-transplant evaluation for liver transplant 02/23/2020   Sepsis ([place]) 01/22/2020   ARF (acute renal failure) ([place]) 01/22/2020   Thrombocytopenia ([place]) 01/22/2020   Calculus of gallbladder with acute cholecystitis without obstruction 10/18/2019   Melena    Acute gastric ulcer with hemorrhage    Acute gastritis without hemorrhage    GI bleed 07/17/2018   Blood in stool    Secondary esophageal varices without bleeding ([place])    Portal hypertension ([place])    Upper GI bleed 04/17/2017   Contusion, back 12/26/2014   Bruising 12/26/2014   Back pain, chronic 11/22/2014   Diabetes mellitus type 2 in obese ([place]) 11/22/2014   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   Acid reflux 11/22/2014   HLD (hyperlipidemia) 11/22/2014   BP (high blood pressure) 11/22/2014   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   Cannot sleep 11/22/2014   Cirrhosis ([place]) 11/22/2014   Adiposity 11/22/2014   Change in blood platelet count 11/22/2014   Avitaminosis D 11/22/2014   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   Esophageal and gastric varices ([place]) 05/08/2012    Past Surgical History:  Procedure Laterality Date   COLONOSCOPY WITH PROPOFOL N/A 08/12/2017   Procedure: COLONOSCOPY WITH PROPOFOL;  Surgeon: Jonathon Bellows, MD;  Location: [facility] ENDOSCOPY;  Service: Gastroenterology;  Laterality: N/A;   ESOPHAGOGASTRODUODENOSCOPY (EGD) WITH PROPOFOL N/A 04/18/2017   Procedure: ESOPHAGOGASTRODUODENOSCOPY (EGD) WITH PROPOFOL;  Surgeon: [name], MD;  Location: [facility] ENDOSCOPY;  Service: Endoscopy;  Laterality: N/A;   ESOPHAGOGASTRODUODENOSCOPY (EGD) WITH PROPOFOL N/A 07/18/2018   Procedure: ESOPHAGOGASTRODUODENOSCOPY (EGD) WITH PROPOFOL;  Surgeon: [name], MD;  Location: [facility] ENDOSCOPY;  Service: Endoscopy;  Laterality: N/A;   EXCESSIVE THIGH / HIP / BUTTOCK / FLANK SKIN  EXCISION     PART OF INNER THIGH/DUE TO SEPSIS INFECTION REMOVED   KIDNEY STONE SURGERY     removed   TONSILLECTOMY AND ADENOIDECTOMY      Prior to Admission medications   Medication Sig Start Date End Date Taking? Authorizing Provider  cephALEXin (KEFLEX) 500 MG capsule Take 1 capsule (500 mg total) by mouth 3 (three) times daily for 7 days. 02/02/21 02/09/21 Yes Vladimir Crofts, MD  ondansetron (ZOFRAN ODT) 4 MG disintegrating tablet Take 1 tablet (4 mg total) by mouth every 8 (eight) hours as needed for nausea or vomiting. 02/02/21  Yes Vladimir Crofts, MD  albuterol (VENTOLIN HFA) 108 (90 Base) MCG/ACT inhaler Inhale 2 puffs into the lungs every 6 (six) hours as needed for wheezing or shortness of breath. 10/22/20   Jerrol Banana., MD  blood glucose meter kit and supplies Dispense based on patient and insurance preference. Use once daily as directed. (FOR ICD-10 E11.69). 01/03/20   Jerrol Banana., MD  cetirizine (ZYRTEC) 10 MG tablet Take 1 tablet (10 mg total) by mouth daily. 10/22/20   Jerrol Banana., MD  Dulaglutide (TRULICITY) 3.29 JJ/8.8CZ SOPN Inject into the skin. 05/09/20   [provider]  empagliflozin (JARDIANCE) 25 MG TABS tablet Take 1 tablet (25 mg total) by mouth daily. 01/12/21   Jerrol Banana., MD  Empagliflozin-metFORMIN HCl ER (SYNJARDY XR) 03-999 MG TB24 Take 1 tablet by mouth daily. 10/27/20   Jerrol Banana., MD  furosemide (LASIX) 20 MG tablet Take 2 tablets (40 mg total) by mouth daily. 01/31/20 01/12/21  Jerrol Banana., MD  loratadine (CLARITIN) 10 MG tablet Take 1 tablet (10 mg total) by mouth daily. 01/12/21   Jerrol Banana., MD  LORazepam (ATIVAN) 0.5 MG tablet Take 1 tablet (0.5 mg total) by mouth every 8 (eight) hours. Patient not taking: Reported on 01/12/2021 10/27/20   Jerrol Banana., MD  promethazine (PHENERGAN) 25 MG tablet Take 1 tablet (25 mg total) by mouth every 8 (eight) hours as needed for nausea or  vomiting. 10/22/20   Jerrol Banana., MD  propranolol (INDERAL) 20 MG tablet Take 1 tablet (20 mg total) by mouth 2 (two) times daily. 01/31/20   Jerrol Banana., MD  sertraline (ZOLOFT) 50 MG tablet Take 1 tablet (50 mg total) by mouth daily. Patient not taking: Reported on 01/12/2021 10/27/20   Jerrol Banana., MD  spironolactone (ALDACTONE) 100 MG tablet TAKE ONE-HALF TABLET BY  MOUTH TWICE DAILY FOR 1  WEEK AND THEN INCREASE TO 1 TABLET TWICE DAILY 01/13/21   Jerrol Banana., MD    Allergies Perflutren lipid microspheres, Latex, and Pineapple  Family History  Problem Relation Age of Onset   Hypertension Mother    Breast cancer Mother    Dementia Mother    Heart attack Father    Gout Father    Hypertension Father    Hypertension Sister    Hypertension Sister    Colon cancer Maternal Aunt    Colon cancer Maternal Grandmother    Colon cancer Paternal Grandmother     Social  History Social History   Tobacco Use   Smoking status: Never   Smokeless tobacco: Never  Substance Use Topics   Alcohol use: No   Drug use: No    Review of Systems  Constitutional: Positive for generalized weakness, malaise and fever/chills Eyes: No visual changes. ENT: No sore throat. Cardiovascular: Denies chest pain. Respiratory: Denies shortness of breath.  Positive for nonproductive cough Gastrointestinal: No abdominal pain.  no vomiting.  No diarrhea.  No constipation. Positive for nausea Genitourinary: Negative for dysuria. Musculoskeletal: Negative for back pain. Positive for left leg pain Skin: Negative for rash. Neurological: Negative for headaches, focal weakness or numbness.   ____________________________________________   PHYSICAL EXAM:  VITAL SIGNS: Vitals:   02/02/21 0949 02/02/21 1449  BP: (!) 137/108 (!) 118/49  Pulse: (!) 56 (!) 57  Resp: 18 18  Temp: 98.7 F (37.1 C)   SpO2: 100% 100%     Constitutional: Alert and oriented. Well appearing  and in no acute distress.  Obese.  Conversational in full sentences. Eyes: Conjunctivae are normal. PERRL. EOMI. Head: Atraumatic. Nose: No congestion/rhinnorhea. Mouth/Throat: Mucous membranes are moist.  Oropharynx non-erythematous. Neck: No stridor. No cervical spine tenderness to palpation. Cardiovascular: Normal rate, regular rhythm. Grossly normal heart sounds.  Good peripheral circulation. Respiratory: Normal respiratory effort.  No retractions. Lungs CTAB. Gastrointestinal: Soft , mildly distended. No CVA tenderness. Mild RUQ tenderness without guarding or peritoneal features.  No diffuse tenderness or peritoneal features throughout. Musculoskeletal:  No joint effusions. No signs of acute trauma. The posterior thigh on the left, distally just proximal to the popliteal fossa does have significant localized tenderness, induration/firmness without erythema or cellulitic features. Neurologic:  Normal speech and language. No gross focal neurologic deficits are appreciated.  Skin:  Skin is warm, dry and intact. No rash noted. Psychiatric: Mood and affect are normal. Speech and behavior are normal.  ____________________________________________   LABS (all labs ordered are listed, but only abnormal results are displayed)  Labs Reviewed  LIPASE, BLOOD - Abnormal; Notable for the following components:      Result Value   Lipase 193 (*)    All other components within normal limits  COMPREHENSIVE METABOLIC PANEL - Abnormal; Notable for the following components:   Sodium 131 (*)    Glucose, Bld 134 (*)    BUN 38 (*)    Creatinine, Ser 1.94 (*)    Calcium 8.2 (*)    Total Protein 6.3 (*)    Albumin 2.2 (*)    AST 54 (*)    Total Bilirubin 2.2 (*)    GFR, Estimated 39 (*)    All other components within normal limits  CBC - Abnormal; Notable for the following components:   WBC 3.9 (*)    RBC 2.24 (*)    Hemoglobin 8.0 (*)    HCT 21.8 (*)    MCH 35.7 (*)    MCHC 36.7 (*)     Platelets 42 (*)    All other components within normal limits  PROTIME-INR - Abnormal; Notable for the following components:   Prothrombin Time 18.7 (*)    INR 1.6 (*)    All other components within normal limits  RESP PANEL BY RT-PCR (FLU A&B, COVID) [id number]  URINALYSIS, COMPLETE (UACMP) WITH MICROSCOPIC   ____________________________________________  12 Lead EKG  Sinus rhythm, rate of 55 bpm.  Normal axis and intervals.  No evidence of acute ischemia.  ____________________________________________  RADIOLOGY  ED MD interpretation: Plain film of the left knee reviewed  by me without evidence of acute bony pathology.  Official radiology report(s): CT ABDOMEN PELVIS WO CONTRAST  Result Date: 02/02/2021 CLINICAL DATA:  Abdominal distention and weight gain. History of cirrhosis. EXAM: CT ABDOMEN AND PELVIS WITHOUT CONTRAST TECHNIQUE: Multidetector CT imaging of the abdomen and pelvis was performed following the standard protocol without IV contrast. COMPARISON:  CT abdomen pelvis dated June 11, 2013. FINDINGS: Lower chest: Trace left pleural effusion. Hypodense blood pool compared to myocardium. Hepatobiliary: Small liver with nodular contour. 1.0 cm cyst in the right hepatic lobe. Small gallstone. No gallbladder wall thickening or biliary dilatation. Pancreas: Unremarkable. No pancreatic ductal dilatation or surrounding inflammatory changes. Spleen: Unchanged moderate to severe splenomegaly. Adrenals/Urinary Tract: Adrenal glands are unremarkable. Multiple bilateral renal calculi again noted. 3.9 cm right renal simple cyst, slightly increased in size since 2014. No hydronephrosis. The bladder is unremarkable. Stomach/Bowel: Stomach is within normal limits. Diminutive or absent appendix. No evidence of bowel wall thickening, distention, or inflammatory changes. Vascular/Lymphatic: Prominent subcentimeter periportal and upper periaortic lymph nodes, likely reactive. Large splenorenal shunt.  Recanalized umbilical vein. Reproductive: Prostate is unremarkable. Other: Moderate ascites.  No pneumoperitoneum. Musculoskeletal: No acute or significant osseous findings. Diffuse anasarca. IMPRESSION: 1. No acute intra-abdominal process. 2. Cirrhosis with sequelae of portal hypertension. Moderate ascites. 3. Cholelithiasis. 4. Bilateral nonobstructive nephrolithiasis. 5. Trace left pleural effusion. Electronically Signed   By: Titus Dubin M.D.   On: 02/02/2021 13:00   DG Chest 2 View  Result Date: 02/02/2021 CLINICAL DATA:  cough, SOB EXAM: CHEST - 2 VIEW COMPARISON:  01/22/2020 FINDINGS: Heart size within normal limits. Mild pulmonary vascular congestion. Minimal basilar atelectasis. IMPRESSION: Minimal pulmonary vascular congestion. Electronically Signed   By: Miachel Roux M.D.   On: 02/02/2021 13:21   US Venous Img Lower Unilateral Left  Result Date: 02/02/2021 CLINICAL DATA:  Left lower extremity pain and edema. Shortness of breath. Evaluate for DVT. EXAM: LEFT LOWER EXTREMITY VENOUS DOPPLER ULTRASOUND TECHNIQUE: Gray-scale sonography with graded compression, as well as color Doppler and duplex ultrasound were performed to evaluate the lower extremity deep venous systems from the level of the common femoral vein and including the common femoral, femoral, profunda femoral, popliteal and calf veins including the posterior tibial, peroneal and gastrocnemius veins when visible. The superficial great saphenous vein was also interrogated. Spectral Doppler was utilized to evaluate flow at rest and with distal augmentation maneuvers in the common femoral, femoral and popliteal veins. COMPARISON:  None. FINDINGS: Contralateral Common Femoral Vein: Respiratory phasicity is normal and symmetric with the symptomatic side. No evidence of thrombus. Normal compressibility. Common Femoral Vein: No evidence of thrombus. Normal compressibility, respiratory phasicity and response to augmentation. Saphenofemoral  Junction: No evidence of thrombus. Normal compressibility and flow on color Doppler imaging. Profunda Femoral Vein: No evidence of thrombus. Normal compressibility and flow on color Doppler imaging. Femoral Vein: No evidence of thrombus. Normal compressibility, respiratory phasicity and response to augmentation. Popliteal Vein: No evidence of thrombus. Normal compressibility, respiratory phasicity and response to augmentation. Calf Veins: No evidence of thrombus. Normal compressibility and flow on color Doppler imaging. Superficial Great Saphenous Vein: No evidence of thrombus. Normal compressibility. Venous Reflux:  None. Other Findings:  None. IMPRESSION: No evidence of DVT within the left lower extremity. Electronically Signed   By: Sandi Mariscal M.D.   On: 02/02/2021 14:28   DG Knee Complete 4 Views Left  Result Date: 02/02/2021 CLINICAL DATA:  Fall EXAM: LEFT KNEE - COMPLETE 4+ VIEW COMPARISON:  None FINDINGS: Osseous demineralization.  Patellofemoral joint space narrowing and spur formation. Ill-defined sclerotic focus at distal femoral metaphysis likely small enchondroma. No acute fracture, dislocation, or bone destruction. No joint effusion. 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 No acute abnormalities. Electronically Signed   By: Lavonia Dana M.D.   On: 02/02/2021 10:21   US ABDOMEN LIMITED RUQ (LIVER/GB)  Result Date: 02/02/2021 CLINICAL DATA:  Cholelithiasis, NASH, cirrhosis, soft a GIA varices EXAM: ULTRASOUND ABDOMEN LIMITED RIGHT UPPER QUADRANT COMPARISON:  CT abdomen and pelvis 02/02/2021 FINDINGS: Gallbladder: 11 mm gallstone in gallbladder. Mild gallbladder wall thickening, nonspecific in the setting of ascites. No sonographic Murphy sign. Common bile duct: Diameter: 2 mm Liver: Small and nodular in appearance consistent with cirrhosis. Small cyst in RIGHT lobe 11 mm and 14 mm in greatest sizes. Heterogeneous parenchymal echogenicity. No  additional discrete hepatic mass otherwise seen. Portal vein is patent on color Doppler imaging with normal direction of blood flow towards the liver. Other: Moderate ascites. IMPRESSION: Cirrhotic liver with moderate ascites. Cholelithiasis. Electronically Signed   By: Lavonia Dana M.D.   On: 02/02/2021 14:44    ____________________________________________   PROCEDURES and INTERVENTIONS  Procedure(s) performed (including Critical Care):  Procedures  Medications - No data to display  ____________________________________________   MDM / ED COURSE   60 year old male with [name] cirrhosis presents to the ED with subacute nausea, fever and atraumatic left leg pain, with evidence of cellulitis and ultimately amenable to trial of outpatient management.  Normal vitals without instability.  Blood work with stigmata of chronic liver failure as well as CKD that has progressed a little bit with similar values as when he was seen as an outpatient 1 week ago.  Elevated lipase is noted to 200, patient denies any abdominal pain, just nausea without emesis.  His COVID swab is negative and EKG is nonischemic.  Imaging of the left knee without evidence of fracture or dislocation and no evidence of DVT to that left leg.  Most likely represents cellulitis in the setting of no VTE.  Intra-abdominal imaging with known cholelithiasis and cirrhotic features without evidence of acute pathology.  Patient ultimately requesting discharge home and I see no barriers to trial of outpatient management with trial of Keflex to treat cellulitis and close follow-up with his PCP.  We discussed return precautions prior to discharge.  Clinical Course as of 02/02/21 1547  Mon Feb 02, 2021  1543 Reassessed.  Patient and wife report that they really just want to go home.  He reports he feels okay.  We discussed negative ultrasound results and the possibility of cellulitis to his leg considering induration and warmth there.  We discussed  signs of pancreatitis.  We discussed flat renal function and return precautions for the ED. [DS]    Clinical Course User Index [DS] Vladimir Crofts, MD    ____________________________________________   FINAL CLINICAL IMPRESSION(S) / ED DIAGNOSES  Final diagnoses:  Gallstones  Cellulitis of left lower extremity     ED Discharge Orders          Ordered    cephALEXin (KEFLEX) 500 MG capsule  3 times daily        02/02/21 1546    ondansetron (ZOFRAN ODT) 4 MG disintegrating tablet  Every 8 hours PRN        02/02/21 1547             Jaslin Novitski   Note:  This document was prepared using Systems analyst and may include  unintentional dictation errors.    Vladimir Crofts, MD 02/02/21 1550

## 2021-02-03 NOTE — Telephone Encounter (Signed)
Please review. Thanks!  

## 2021-02-04 ENCOUNTER — Ambulatory Visit: Payer: Self-pay

## 2021-02-04 ENCOUNTER — Encounter: Payer: Self-pay | Admitting: *Deleted

## 2021-02-04 NOTE — Telephone Encounter (Signed)
Patient nor wife was on the line. Information received will be placed in clinical call queue. This encounter was created in error - please disregard.

## 2021-02-04 NOTE — Telephone Encounter (Signed)
Patient called and says that he was seen in the ED on Friday due to not feeling well. He says he mentioned to the ED doctor that his GI doctor at [facility] told him that he may need a paracentesis or a blood transfusion. He says the GI doctor gave him the option to have it done at [facility] or at [facility] facility, but Dr. Rosanna Randy will need to order it. He says he called the office on Friday to ask for that order, but no one called him back. He says after all the workup in the ED the provider would not order the paracentesis. He says he's been having increasing weakness so that's why he went to the ED, swelling in his legs and abdomen due to his liver cirrhosis. He says he was discharged home and over the weekend he was vomiting and running a low grade fever. He says today he's dizzy, feels weaker, walking not as good. He says his abdomen is swelling. I advised he will need to see Dr. Rosanna Randy in the office, unable to schedule. I called the office and spoke to Judson Roch, [facility] about the appointment, she schedule the patient for tomorrow at 1520 with Dr. Rosanna Randy. I advised the patient of the appointment, care advice given, patient verbalized understanding.   Summary: fu / for symptoms and needing respons3e for procedure order from [facility]   Wife has called in re to pt visit to ED 2 days ago and the request for a paracentesis procedure to drain fluid from gut. She is concerned as procedure needs to happen as husband is getting increasing worse. Wife is at work and making call for husband, he is aware that nurse will be FU on her call and hopefully expedite matter for procedure and worsening symptoms.  Call pt at [phone]      Reason for Disposition  [1] MODERATE weakness (i.e., interferes with work, school, normal activities) AND [2] persists > 3 days  Answer Assessment - Initial Assessment Questions 1. DESCRIPTION: "Describe how you are feeling."     Feeling weaker and weaker 2. SEVERITY: "How bad is it?"  "Can you stand and  walk?"   - MILD - Feels weak or tired, but does not interfere with work, school or normal activities   - [place] to stand and walk; weakness interferes with work, school, or normal activities   - SEVERE - Unable to stand or walk     Moderate 3. ONSET:  "When did the weakness begin?"     Ongoing for a while, gotten worse since last Friday 4. CAUSE: "What do you think is causing the weakness?"     I don't know 5. MEDICINES: "Have you recently started a new medicine or had a change in the amount of a medicine?"     Yes, my diabetes medicine is the only thing 3 weeks ago 6. OTHER SYMPTOMS: "Do you have any other symptoms?" (e.g., chest pain, fever, cough, SOB, vomiting, diarrhea, bleeding, other areas of pain)     Dizziness, abdominal swelling 7. PREGNANCY: "Is there any chance you are pregnant?" "When was your last menstrual period?"     N/A  Protocols used: Weakness (Generalized) and Fatigue-A-AH

## 2021-02-05 ENCOUNTER — Encounter: Payer: Self-pay | Admitting: Family Medicine

## 2021-02-05 ENCOUNTER — Ambulatory Visit: Payer: BC Managed Care – PPO | Admitting: Family Medicine

## 2021-02-05 ENCOUNTER — Other Ambulatory Visit: Payer: Self-pay

## 2021-02-05 VITALS — BP 115/51 | HR 55 | Temp 98.4°F | Resp 16 | Ht 72.0 in | Wt 304.0 lb

## 2021-02-05 DIAGNOSIS — I8510 Secondary esophageal varices without bleeding: Secondary | ICD-10-CM

## 2021-02-05 DIAGNOSIS — R188 Other ascites: Secondary | ICD-10-CM

## 2021-02-05 DIAGNOSIS — K7460 Unspecified cirrhosis of liver: Secondary | ICD-10-CM

## 2021-02-05 DIAGNOSIS — K766 Portal hypertension: Secondary | ICD-10-CM | POA: Diagnosis not present

## 2021-02-05 DIAGNOSIS — R5383 Other fatigue: Secondary | ICD-10-CM

## 2021-02-05 DIAGNOSIS — Z6841 Body Mass Index (BMI) 40.0 and over, adult: Secondary | ICD-10-CM

## 2021-02-05 DIAGNOSIS — G4733 Obstructive sleep apnea (adult) (pediatric): Secondary | ICD-10-CM

## 2021-02-05 DIAGNOSIS — E1169 Type 2 diabetes mellitus with other specified complication: Secondary | ICD-10-CM

## 2021-02-05 DIAGNOSIS — E669 Obesity, unspecified: Secondary | ICD-10-CM

## 2021-02-05 DIAGNOSIS — D696 Thrombocytopenia, unspecified: Secondary | ICD-10-CM

## 2021-02-05 DIAGNOSIS — K7581 Nonalcoholic steatohepatitis (NASH): Secondary | ICD-10-CM | POA: Diagnosis not present

## 2021-02-05 NOTE — Progress Notes (Signed)
Established patient visit   Patient: Jesse Zimmerman   DOB: May 05, 1961   60 y.o. Male  MRN: [id number] Visit Date: 02/05/2021  Today's healthcare provider: Wilhemena Durie, MD   Chief Complaint  Patient presents with   ER follow up    Subjective   HPI  Follow up ER visit  Patient says that he was seen in the ED on Friday due to not feeling well. He says he mentioned to the ED doctor that his GI doctor at [facility] told him that he may need a paracentesis or a blood transfusion. He says the GI doctor gave him the option to have it done at [facility] or at [facility] facility, but Dr. Rosanna Randy will need to order it. He says he called the office on Friday to ask for that order, but no one called him back. He says after all the workup in the ED the provider would not order the paracentesis. He says he's been having increasing weakness so that's why he went to the ED, swelling in his legs and abdomen due to his liver cirrhosis. He says he was discharged home and over the weekend he was vomiting and running a low grade fever. He says today he's dizzy, feels weaker, walking not as good. Patient has severe fatigue and feels as though he would benefit from transfusion of blood and for the paracentesis.  He does have an EGD scheduled this coming Monday at [facility]. He sees his hepatologist at [facility] in a month. Is spironolactone was cut back on his last visit to GI pathology by the PA  Wt Readings from Last 3 Encounters:  02/05/21 (!) 304 lb (137.9 kg)  02/02/21 278 lb (126.1 kg)  01/12/21 (!) 317 lb (143.8 kg)   BP Readings from Last 3 Encounters:  02/02/21 (!) 118/49  01/12/21 122/67  10/27/20 121/71         Medications: Outpatient Medications Prior to Visit  Medication Sig   albuterol (VENTOLIN HFA) 108 (90 Base) MCG/ACT inhaler Inhale 2 puffs into the lungs every 6 (six) hours as needed for wheezing or shortness of breath.   cephALEXin (KEFLEX) 500 MG capsule Take 1 capsule (500 mg total) by  mouth 3 (three) times daily for 7 days.   cetirizine (ZYRTEC) 10 MG tablet Take 1 tablet (10 mg total) by mouth daily.   Dulaglutide (TRULICITY) 7.82 NF/6.2ZH SOPN Inject into the skin.   empagliflozin (JARDIANCE) 25 MG TABS tablet Take 1 tablet (25 mg total) by mouth daily.   Empagliflozin-metFORMIN HCl ER (SYNJARDY XR) 03-999 MG TB24 Take 1 tablet by mouth daily.   blood glucose meter kit and supplies Dispense based on patient and insurance preference. Use once daily as directed. (FOR ICD-10 E11.69).   furosemide (LASIX) 20 MG tablet Take 2 tablets (40 mg total) by mouth daily.   loratadine (CLARITIN) 10 MG tablet Take 1 tablet (10 mg total) by mouth daily.   LORazepam (ATIVAN) 0.5 MG tablet Take 1 tablet (0.5 mg total) by mouth every 8 (eight) hours. (Patient not taking: Reported on 01/12/2021)   ondansetron (ZOFRAN ODT) 4 MG disintegrating tablet Take 1 tablet (4 mg total) by mouth every 8 (eight) hours as needed for nausea or vomiting.   promethazine (PHENERGAN) 25 MG tablet Take 1 tablet (25 mg total) by mouth every 8 (eight) hours as needed for nausea or vomiting.   propranolol (INDERAL) 20 MG tablet Take 1 tablet (20 mg total) by mouth 2 (two) times  daily.   sertraline (ZOLOFT) 50 MG tablet Take 1 tablet (50 mg total) by mouth daily. (Patient not taking: Reported on 01/12/2021)   spironolactone (ALDACTONE) 100 MG tablet TAKE ONE-HALF TABLET BY  MOUTH TWICE DAILY FOR 1  WEEK AND THEN INCREASE TO 1 TABLET TWICE DAILY   No facility-administered medications prior to visit.    Review of Systems      Objective  -------------------------------------------------------------------------------------------------------------------- Resp 16   Wt (!) 304 lb (137.9 kg)   BMI 41.23 kg/m  BP Readings from Last 3 Encounters:  02/05/21 (!) 115/51  02/02/21 (!) 118/49  01/12/21 122/67   Wt Readings from Last 3 Encounters:  02/05/21 (!) 304 lb (137.9 kg)  02/02/21 278 lb (126.1 kg)  01/12/21 (!)  317 lb (143.8 kg)       Physical Exam Vitals reviewed.  Constitutional:      Appearance: Normal appearance. He is obese.  HENT:     Head: Normocephalic and atraumatic.     Right Ear: External ear normal.     Left Ear: External ear normal.     Nose: Nose normal.     Mouth/Throat:     Pharynx: Oropharynx is clear.  Eyes:     General: No scleral icterus.    Conjunctiva/sclera: Conjunctivae normal.  Neck:     Comments: No JVD noted. Cardiovascular:     Rate and Rhythm: Normal rate and regular rhythm.     Pulses: Normal pulses.     Heart sounds: Normal heart sounds.  Pulmonary:     Effort: Pulmonary effort is normal.  Abdominal:     General: There is no distension.     Palpations: Abdomen is soft.     Tenderness: There is no abdominal tenderness.  Musculoskeletal:     Comments: 1+  edema.  Skin:    General: Skin is warm and dry.  Neurological:     General: No focal deficit present.     Mental Status: He is alert and oriented to person, place, and time. Mental status is at baseline.  Psychiatric:        Mood and Affect: Mood normal.        Behavior: Behavior normal.        Thought Content: Thought content normal.        Judgment: Judgment normal.      No results found for any visits on 02/05/21.  Assessment & Plan  ---------------------------------------------------------------------------------------------------------------------- 1. Cirrhosis of liver with ascites, unspecified hepatic cirrhosis type ([place]) Will try to get in touch with hepatology.  Consider trying to arrange a paracentesis for interventional radiology for comfort per patient. - CBC with Differential/Platelet - Comprehensive metabolic panel - Lipase - Amylase  2. Portal hypertension ([place]) Patient has EGD scheduled Monday coming up at [facility]  3. Class 3 severe obesity due to excess calories with serious comorbidity and body mass index (BMI) of 40.0 to 44.9 in adult [facility]) Diet and exercise has been  stressed  4. OSA (obstructive sleep apnea) On CPAP  5. Fatigue, unspecified type Last hemoglobin was 8.  Discussed the reasoning behind transfusion and not transfusing this patient at this point time.  He is not having any difficulty with chest pain or breathing.  6. Secondary esophageal varices without bleeding (HCC) Follow-up in the next week  7. Diabetes mellitus type 2 in obese (HCC) A1c when appropriate  8. Thrombocytopenia ([place]) Secondary to cirrhosis   No follow-ups on file.      I, Wilhemena Durie,  MD, have reviewed all documentation for this visit. The documentation on 02/13/21 for the exam, diagnosis, procedures, and orders are all accurate and complete.    Kiah Keay Cranford Mon, MD  [facility] [phone] (phone) [phone] (fax)  [place]

## 2021-02-05 NOTE — Patient Instructions (Signed)
Push fluids

## 2021-02-06 LAB — LIPASE: Lipase: 143 U/L — ABNORMAL HIGH (ref 13–78)

## 2021-02-06 LAB — CBC WITH DIFFERENTIAL/PLATELET
Basophils Absolute: 0 10*3/uL (ref 0.0–0.2)
Basos: 1 %
EOS (ABSOLUTE): 0.3 10*3/uL (ref 0.0–0.4)
Eos: 7 %
Hematocrit: 20.3 % — ABNORMAL LOW (ref 37.5–51.0)
Hemoglobin: 7.3 g/dL — ABNORMAL LOW (ref 13.0–17.7)
Immature Grans (Abs): 0 10*3/uL (ref 0.0–0.1)
Immature Granulocytes: 1 %
Lymphocytes Absolute: 1.1 10*3/uL (ref 0.7–3.1)
Lymphs: 23 %
MCH: 34.4 pg — ABNORMAL HIGH (ref 26.6–33.0)
MCHC: 36 g/dL — ABNORMAL HIGH (ref 31.5–35.7)
MCV: 96 fL (ref 79–97)
Monocytes Absolute: 0.4 10*3/uL (ref 0.1–0.9)
Monocytes: 8 %
Neutrophils Absolute: 2.9 10*3/uL (ref 1.4–7.0)
Neutrophils: 60 %
RBC: 2.12 x10E6/uL — CL (ref 4.14–5.80)
RDW: 13.7 % (ref 11.6–15.4)
WBC: 4.7 10*3/uL (ref 3.4–10.8)

## 2021-02-06 LAB — COMPREHENSIVE METABOLIC PANEL
ALT: 22 IU/L (ref 0–44)
AST: 50 IU/L — ABNORMAL HIGH (ref 0–40)
Albumin/Globulin Ratio: 0.7 — ABNORMAL LOW (ref 1.2–2.2)
Albumin: 2.5 g/dL — ABNORMAL LOW (ref 3.8–4.9)
Alkaline Phosphatase: 84 IU/L (ref 44–121)
BUN/Creatinine Ratio: 17 (ref 9–20)
BUN: 29 mg/dL — ABNORMAL HIGH (ref 6–24)
Bilirubin Total: 1.5 mg/dL — ABNORMAL HIGH (ref 0.0–1.2)
CO2: 23 mmol/L (ref 20–29)
Calcium: 8.1 mg/dL — ABNORMAL LOW (ref 8.7–10.2)
Chloride: 106 mmol/L (ref 96–106)
Creatinine, Ser: 1.73 mg/dL — ABNORMAL HIGH (ref 0.76–1.27)
Globulin, Total: 3.8 g/dL (ref 1.5–4.5)
Glucose: 123 mg/dL — ABNORMAL HIGH (ref 65–99)
Potassium: 4.1 mmol/L (ref 3.5–5.2)
Sodium: 139 mmol/L (ref 134–144)
Total Protein: 6.3 g/dL (ref 6.0–8.5)
eGFR: 45 mL/min/{1.73_m2} — ABNORMAL LOW (ref >59)

## 2021-02-06 LAB — AMYLASE: Amylase: 78 U/L (ref 31–110)

## 2021-02-09 DIAGNOSIS — K766 Portal hypertension: Secondary | ICD-10-CM | POA: Diagnosis not present

## 2021-02-09 DIAGNOSIS — K3189 Other diseases of stomach and duodenum: Secondary | ICD-10-CM | POA: Diagnosis not present

## 2021-02-09 DIAGNOSIS — G8929 Other chronic pain: Secondary | ICD-10-CM | POA: Diagnosis not present

## 2021-02-09 DIAGNOSIS — K2970 Gastritis, unspecified, without bleeding: Secondary | ICD-10-CM | POA: Diagnosis not present

## 2021-02-09 DIAGNOSIS — Z79899 Other long term (current) drug therapy: Secondary | ICD-10-CM | POA: Diagnosis not present

## 2021-02-09 DIAGNOSIS — K7460 Unspecified cirrhosis of liver: Secondary | ICD-10-CM | POA: Diagnosis not present

## 2021-02-09 DIAGNOSIS — I8500 Esophageal varices without bleeding: Secondary | ICD-10-CM | POA: Diagnosis not present

## 2021-02-09 DIAGNOSIS — K2900 Acute gastritis without bleeding: Secondary | ICD-10-CM | POA: Diagnosis not present

## 2021-02-09 DIAGNOSIS — E119 Type 2 diabetes mellitus without complications: Secondary | ICD-10-CM | POA: Diagnosis not present

## 2021-02-09 DIAGNOSIS — I8510 Secondary esophageal varices without bleeding: Secondary | ICD-10-CM | POA: Diagnosis not present

## 2021-02-09 DIAGNOSIS — Z9889 Other specified postprocedural states: Secondary | ICD-10-CM | POA: Diagnosis not present

## 2021-02-09 DIAGNOSIS — G4730 Sleep apnea, unspecified: Secondary | ICD-10-CM | POA: Diagnosis not present

## 2021-02-09 DIAGNOSIS — Z6841 Body Mass Index (BMI) 40.0 and over, adult: Secondary | ICD-10-CM | POA: Diagnosis not present

## 2021-02-09 DIAGNOSIS — Z9284 Personal history of unintended awareness under general anesthesia: Secondary | ICD-10-CM | POA: Diagnosis not present

## 2021-02-13 NOTE — Progress Notes (Signed)
I,April Miller,acting as a scribe for Wilhemena Durie, MD.,have documented all relevant documentation on the behalf of Wilhemena Durie, MD,as directed by  Wilhemena Durie, MD while in the presence of Wilhemena Durie, MD.   Established patient visit   Patient: Jesse Zimmerman   DOB: 1960/07/12   60 y.o. Male  MRN: [id number] Visit Date: 02/16/2021  Today's healthcare provider: Wilhemena Durie, MD   Chief Complaint  Patient presents with   Follow-up   Diabetes   Hypertension   Subjective  -------------------------------------------------------------------------------------------------------------------- HPI  Patient has undergone EGD and has follow-up next month with GI regarding his cirrhosis. Is still feeling very weak but is not feeling any worse than the last time he was seen.  He does have some right ear pain today.  When he had his EGD and his varices are smaller.  He was also told that he had a small ulcer and small bowel colitis.  He would still like to have paracentesis done for his moderate ascites. Diabetes Mellitus Type II, follow-up  Lab Results  Component Value Date   HGBA1C 5.0 02/16/2021   HGBA1C 7.3 (H) 10/27/2020   HGBA1C 8.9 (A) 04/09/2020   Last seen for diabetes 2 months ago.  Management since then includes; Restarted Jardiance as I think the metformin because of the diarrhea not Jardiance.  He should diurese with this. He reports good compliance with treatment. He is not having side effects. none  Home blood sugar records: fasting range: 90-170  Episodes of hypoglycemia? No none   Current insulin regiment: Trulicity Most Recent Eye Exam: 03/25/2020  --------------------------------------------------------------------------------------------------- Hypertension, follow-up  BP Readings from Last 3 Encounters:  02/16/21 123/71  02/05/21 (!) 115/51  02/02/21 (!) 118/49   Wt Readings from Last 3 Encounters:  02/16/21 (!) 306 lb  (138.8 kg)  02/05/21 (!) 304 lb (137.9 kg)  02/02/21 278 lb (126.1 kg)     He was last seen for hypertension 2 months ago.  BP at that visit was 122/67. Management since that visit includes; Good blood pressure control. He reports good compliance with treatment. He is not having side effects. none He is not exercising. He is adherent to low salt diet.   Outside blood pressures are not checking.  He does not smoke.  Use of agents associated with hypertension: none.   ---------------------------------------------------------------------------------------------------      Medications: Outpatient Medications Prior to Visit  Medication Sig   albuterol (VENTOLIN HFA) 108 (90 Base) MCG/ACT inhaler Inhale 2 puffs into the lungs every 6 (six) hours as needed for wheezing or shortness of breath.   blood glucose meter kit and supplies Dispense based on patient and insurance preference. Use once daily as directed. (FOR ICD-10 E11.69).   cetirizine (ZYRTEC) 10 MG tablet Take 1 tablet (10 mg total) by mouth daily.   Dulaglutide (TRULICITY) 1.74 YC/1.4GY SOPN Inject into the skin.   empagliflozin (JARDIANCE) 25 MG TABS tablet Take 1 tablet (25 mg total) by mouth daily.   furosemide (LASIX) 20 MG tablet Take 2 tablets (40 mg total) by mouth daily.   loratadine (CLARITIN) 10 MG tablet Take 1 tablet (10 mg total) by mouth daily.   ondansetron (ZOFRAN ODT) 4 MG disintegrating tablet Take 1 tablet (4 mg total) by mouth every 8 (eight) hours as needed for nausea or vomiting.   promethazine (PHENERGAN) 25 MG tablet Take 1 tablet (25 mg total) by mouth every 8 (eight) hours as needed for nausea or vomiting.  propranolol (INDERAL) 20 MG tablet Take 1 tablet (20 mg total) by mouth 2 (two) times daily.   spironolactone (ALDACTONE) 100 MG tablet TAKE ONE-HALF TABLET BY  MOUTH TWICE DAILY FOR 1  WEEK AND THEN INCREASE TO 1 TABLET TWICE DAILY   Empagliflozin-metFORMIN HCl ER (SYNJARDY XR) 03-999 MG TB24  Take 1 tablet by mouth daily. (Patient not taking: Reported on 02/16/2021)   LORazepam (ATIVAN) 0.5 MG tablet Take 1 tablet (0.5 mg total) by mouth every 8 (eight) hours. (Patient not taking: No sig reported)   sertraline (ZOLOFT) 50 MG tablet Take 1 tablet (50 mg total) by mouth daily. (Patient not taking: No sig reported)   No facility-administered medications prior to visit.    Review of Systems  Constitutional:  Negative for appetite change, chills and fever.  Respiratory:  Negative for chest tightness, shortness of breath and wheezing.   Cardiovascular:  Negative for chest pain and palpitations.  Gastrointestinal:  Negative for abdominal pain, nausea and vomiting.      Objective  -------------------------------------------------------------------------------------------------------------------- BP 123/71 (BP Location: Right Arm, Patient Position: Sitting, Cuff Size: Large)   Pulse (!) 59   Ht 6' (1.829 m)   Wt (!) 306 lb (138.8 kg)   SpO2 (!) 16%   PF 99 L/min   BMI 41.50 kg/m     Physical Exam Vitals reviewed.  Constitutional:      Appearance: Normal appearance. He is obese.  HENT:     Head: Normocephalic and atraumatic.     Right Ear: External ear normal.     Left Ear: Tympanic membrane and external ear normal.     Ears:     Comments: Right TM is full .    Nose: Nose normal.     Mouth/Throat:     Pharynx: Oropharynx is clear.  Eyes:     General: No scleral icterus.    Conjunctiva/sclera: Conjunctivae normal.  Neck:     Comments: No JVD noted. Cardiovascular:     Rate and Rhythm: Normal rate and regular rhythm.     Pulses: Normal pulses.     Heart sounds: Normal heart sounds.  Pulmonary:     Effort: Pulmonary effort is normal.  Abdominal:     General: There is no distension.     Palpations: Abdomen is soft.     Tenderness: There is no abdominal tenderness.  Musculoskeletal:     Comments: 1+  edema.  Skin:    General: Skin is warm and dry.  Neurological:      General: No focal deficit present.     Mental Status: He is alert and oriented to person, place, and time. Mental status is at baseline.  Psychiatric:        Mood and Affect: Mood normal.        Behavior: Behavior normal.        Thought Content: Thought content normal.        Judgment: Judgment normal.      Results for orders placed or performed in visit on 02/16/21  POCT glycosylated hemoglobin (Hb A1C)  Result Value Ref Range   Hemoglobin A1C 5.0 4.0 - 5.6 %   Est. average glucose Bld gHb Est-mCnc 97     Assessment & Plan  ---------------------------------------------------------------------------------------------------------------------- 1. Diabetes mellitus type 2 in obese (HCC) A1c under excellent control at 5.0.  He is prediabetic now - POCT glycosylated hemoglobin (Hb A1C)--5.0 today - CBC w/Diff/Platelet - Comprehensive Metabolic Panel (CMET)  2. Essential hypertension   3. Cirrhosis  of liver with ascites, unspecified hepatic cirrhosis type ([place]) Orders been placed with interventional radiology for paracentesis for symptom relief for moderate ascites. - CBC w/Diff/Platelet - Comprehensive Metabolic Panel (CMET)  4. Anemia, unspecified type Significant anemia.  If it drops much or lower the patient may require transfusion. - CBC w/Diff/Platelet - Comprehensive Metabolic Panel (CMET)  5. Chronic kidney disease, unspecified CKD stage  - CBC w/Diff/Platelet - Comprehensive Metabolic Panel (CMET)  6. PUD (peptic ulcer disease) Found on EGD.  Will empirically treat for a couple of months with omeprazole - omeprazole (PRILOSEC) 20 MG capsule; Take 1 capsule (20 mg total) by mouth daily.  Dispense: 90 capsule; Refill: 1   Return in about 9 weeks (around 04/20/2021).      I, Wilhemena Durie, MD, have reviewed all documentation for this visit. The documentation on 02/21/21 for the exam, diagnosis, procedures, and orders are all accurate and  complete.    Jaisean Monteforte Cranford Mon, MD  [facility] [phone] (phone) [phone] (fax)  [place]

## 2021-02-16 ENCOUNTER — Encounter: Payer: Self-pay | Admitting: Family Medicine

## 2021-02-16 ENCOUNTER — Ambulatory Visit: Payer: BC Managed Care – PPO | Admitting: Family Medicine

## 2021-02-16 ENCOUNTER — Other Ambulatory Visit: Payer: Self-pay

## 2021-02-16 VITALS — BP 123/71 | HR 59 | Ht 72.0 in | Wt 306.0 lb

## 2021-02-16 DIAGNOSIS — K7460 Unspecified cirrhosis of liver: Secondary | ICD-10-CM | POA: Diagnosis not present

## 2021-02-16 DIAGNOSIS — D649 Anemia, unspecified: Secondary | ICD-10-CM

## 2021-02-16 DIAGNOSIS — K279 Peptic ulcer, site unspecified, unspecified as acute or chronic, without hemorrhage or perforation: Secondary | ICD-10-CM

## 2021-02-16 DIAGNOSIS — I10 Essential (primary) hypertension: Secondary | ICD-10-CM | POA: Diagnosis not present

## 2021-02-16 DIAGNOSIS — R188 Other ascites: Secondary | ICD-10-CM | POA: Diagnosis not present

## 2021-02-16 DIAGNOSIS — E1169 Type 2 diabetes mellitus with other specified complication: Secondary | ICD-10-CM

## 2021-02-16 DIAGNOSIS — N189 Chronic kidney disease, unspecified: Secondary | ICD-10-CM

## 2021-02-16 DIAGNOSIS — E669 Obesity, unspecified: Secondary | ICD-10-CM | POA: Diagnosis not present

## 2021-02-16 LAB — POCT GLYCOSYLATED HEMOGLOBIN (HGB A1C)
Est. average glucose Bld gHb Est-mCnc: 97
Hemoglobin A1C: 5 % (ref 4.0–5.6)

## 2021-02-16 MED ORDER — OMEPRAZOLE 20 MG PO CPDR: 20.0000 mg | DELAYED_RELEASE_CAPSULE | Freq: Every day | ORAL | 1 refills | Status: DC

## 2021-02-17 LAB — COMPREHENSIVE METABOLIC PANEL
ALT: 21 IU/L (ref 0–44)
AST: 40 IU/L (ref 0–40)
Albumin/Globulin Ratio: 0.7 — ABNORMAL LOW (ref 1.2–2.2)
Albumin: 2.6 g/dL — ABNORMAL LOW (ref 3.8–4.9)
Alkaline Phosphatase: 118 IU/L (ref 44–121)
BUN/Creatinine Ratio: 14 (ref 9–20)
BUN: 22 mg/dL (ref 6–24)
Bilirubin Total: 2 mg/dL — ABNORMAL HIGH (ref 0.0–1.2)
CO2: 24 mmol/L (ref 20–29)
Calcium: 8.3 mg/dL — ABNORMAL LOW (ref 8.7–10.2)
Chloride: 107 mmol/L — ABNORMAL HIGH (ref 96–106)
Creatinine, Ser: 1.56 mg/dL — ABNORMAL HIGH (ref 0.76–1.27)
Globulin, Total: 3.6 g/dL (ref 1.5–4.5)
Glucose: 134 mg/dL — ABNORMAL HIGH (ref 65–99)
Potassium: 4.7 mmol/L (ref 3.5–5.2)
Sodium: 139 mmol/L (ref 134–144)
Total Protein: 6.2 g/dL (ref 6.0–8.5)
eGFR: 51 mL/min/{1.73_m2} — ABNORMAL LOW (ref >59)

## 2021-02-17 LAB — CBC WITH DIFFERENTIAL/PLATELET
Basophils Absolute: 0.1 10*3/uL (ref 0.0–0.2)
Basos: 1 %
EOS (ABSOLUTE): 0.3 10*3/uL (ref 0.0–0.4)
Eos: 7 %
Hematocrit: 23.3 % — ABNORMAL LOW (ref 37.5–51.0)
Hemoglobin: 8.2 g/dL — ABNORMAL LOW (ref 13.0–17.7)
Immature Grans (Abs): 0 10*3/uL (ref 0.0–0.1)
Immature Granulocytes: 0 %
Lymphocytes Absolute: 0.7 10*3/uL (ref 0.7–3.1)
Lymphs: 16 %
MCH: 33.9 pg — ABNORMAL HIGH (ref 26.6–33.0)
MCHC: 35.2 g/dL (ref 31.5–35.7)
MCV: 96 fL (ref 79–97)
Monocytes Absolute: 0.4 10*3/uL (ref 0.1–0.9)
Monocytes: 9 %
Neutrophils Absolute: 3.1 10*3/uL (ref 1.4–7.0)
Neutrophils: 67 %
Platelets: 38 10*3/uL — CL (ref 150–450)
RBC: 2.42 x10E6/uL — CL (ref 4.14–5.80)
RDW: 12.8 % (ref 11.6–15.4)
WBC: 4.6 10*3/uL (ref 3.4–10.8)

## 2021-03-12 ENCOUNTER — Encounter: Payer: Self-pay | Admitting: Pulmonary Disease

## 2021-03-12 ENCOUNTER — Other Ambulatory Visit: Payer: Self-pay

## 2021-03-12 ENCOUNTER — Ambulatory Visit: Payer: BC Managed Care – PPO | Admitting: Pulmonary Disease

## 2021-03-12 VITALS — BP 110/80 | HR 47 | Temp 97.9°F | Ht 72.0 in | Wt 317.0 lb

## 2021-03-12 DIAGNOSIS — R0683 Snoring: Secondary | ICD-10-CM

## 2021-03-12 NOTE — Progress Notes (Signed)
[facility]  Chief Complaint  Patient presents with   Consult    Consult hx of osa    Constitutional:  BP 110/80 (BP Location: Left Arm, Patient Position: Sitting, Cuff Size: Normal)   Pulse (!) 47   Temp 97.9 F (36.6 C) (Oral)   Ht 6' (1.829 m)   Wt (!) 317 lb (143.8 kg)   SpO2 97%   BMI 42.99 kg/m   Past Medical History:  Back pain, NASH with cirrhosis, DM type 2, Esophageal varices, HLD  Past Surgical History:  He  has a past surgical history that includes Kidney stone surgery; Excessive thigh / hip / buttock / flank skin excision; Tonsillectomy and adenoidectomy; Esophagogastroduodenoscopy (egd) with propofol (N/A, 04/18/2017); Colonoscopy with propofol (N/A, 08/12/2017); and Esophagogastroduodenoscopy (egd) with propofol (N/A, 07/18/2018).  Brief Summary:  Jesse Zimmerman is a 60 y.o. male with snoring.      Subjective:   He had sleep study years ago and was told he has sleep apnea.  He was on Bipap then, but wasn't comfortable to use.  He has not been on therapy for years.    He continues to have trouble with his sleep.  He is restless and snores at night.  He has trouble sleeping on his back.  He has to nap during the day.  He goes to sleep at midnight.  He falls asleep 30 minutes.  He wakes up one or two times to use the bathroom.  He gets out of bed at 8 am.  He feels tired in the morning.  He denies morning headache.  He does not use anything to help him fall sleep or stay awake.  He denies sleep walking, sleep talking, bruxism, or nightmares.  There is no history of restless legs.  He denies sleep hallucinations, sleep paralysis, or cataplexy.  The Epworth score is 7 out of 24.   Physical Exam:   Appearance - well kempt   ENMT - no sinus tenderness, no oral exudate, no LAN, Mallampati 4 airway, no stridor  Respiratory - equal breath sounds bilaterally, no wheezing or rales  CV - s1s2 regular rate and rhythm, no  murmurs  Ext - no clubbing, no edema  Skin - no rashes  Psych - normal mood and affect   Sleep Tests:    Social History:  He  reports that he has never smoked. He has never used smokeless tobacco. He reports that he does not drink alcohol and does not use drugs.  Family History:  His family history includes Breast cancer in his mother; Colon cancer in his maternal aunt, maternal grandmother, and paternal grandmother; Dementia in his mother; Gout in his father; Heart attack in his father; Hypertension in his father, mother, sister, and sister.    Discussion:  He has snoring, sleep disruption, apnea, and daytime sleepiness.  His BMI is > 35.  He reports prior history of obstructive sleep apnea, and I am concerned he still has sleep apnea.  Assessment/Plan:   Snoring with excessive daytime sleepiness. - will need to arrange for a home sleep study  NASH with cirrhosis. - followed by liver transplant team at [facility]  Obesity. - discussed how weight can impact sleep and risk for sleep disordered breathing - discussed options to assist with weight loss: combination of diet modification, cardiovascular and strength training exercises  Cardiovascular risk. - had an extensive discussion regarding the adverse health consequences related to untreated sleep disordered breathing -  specifically discussed the risks for hypertension, coronary artery disease, cardiac dysrhythmias, cerebrovascular disease, and diabetes - lifestyle modification discussed  Safe driving practices. - discussed how sleep disruption can increase risk of accidents, particularly when driving - safe driving practices were discussed  Therapies for obstructive sleep apnea. - if the sleep study shows significant sleep apnea, then various therapies for treatment were reviewed: CPAP, oral appliance, and surgical interventions   Time Spent Involved in Patient Care on Day of Examination:  32 minutes  Follow up:    Patient Instructions  Will arrange for home sleep study Will call to arrange for follow up after sleep study reviewed  Medication List:   Allergies as of 03/12/2021       Reactions   Perflutren Lipid Microspheres    Other reaction(s): Other (See Comments) Back pain   Latex    LATEX TAPE   Pineapple Hives        Medication List        Accurate as of March 12, 2021 10:44 AM. If you have any questions, ask your nurse or doctor.          albuterol 108 (90 Base) MCG/ACT inhaler Commonly known as: VENTOLIN HFA Inhale 2 puffs into the lungs every 6 (six) hours as needed for wheezing or shortness of breath.   blood glucose meter kit and supplies Dispense based on patient and insurance preference. Use once daily as directed. (FOR ICD-10 E11.69).   cetirizine 10 MG tablet Commonly known as: ZYRTEC Take 1 tablet (10 mg total) by mouth daily.   empagliflozin 25 MG Tabs tablet Commonly known as: JARDIANCE Take 1 tablet (25 mg total) by mouth daily.   furosemide 20 MG tablet Commonly known as: LASIX Take 2 tablets (40 mg total) by mouth daily.   loratadine 10 MG tablet Commonly known as: CLARITIN Take 1 tablet (10 mg total) by mouth daily.   LORazepam 0.5 MG tablet Commonly known as: ATIVAN Take 1 tablet (0.5 mg total) by mouth every 8 (eight) hours.   omeprazole 20 MG capsule Commonly known as: PRILOSEC Take 1 capsule (20 mg total) by mouth daily.   ondansetron 4 MG disintegrating tablet Commonly known as: Zofran ODT Take 1 tablet (4 mg total) by mouth every 8 (eight) hours as needed for nausea or vomiting.   promethazine 25 MG tablet Commonly known as: PHENERGAN Take 1 tablet (25 mg total) by mouth every 8 (eight) hours as needed for nausea or vomiting.   propranolol 20 MG tablet Commonly known as: INDERAL Take 1 tablet (20 mg total) by mouth 2 (two) times daily.   sertraline 50 MG tablet Commonly known as: ZOLOFT Take 1 tablet (50 mg total) by  mouth daily.   spironolactone 100 MG tablet Commonly known as: ALDACTONE TAKE ONE-HALF TABLET BY  MOUTH TWICE DAILY FOR 1  WEEK AND THEN INCREASE TO 1 TABLET TWICE DAILY   Synjardy XR 03-999 MG Tb24 Generic drug: Empagliflozin-metFORMIN HCl ER Take 1 tablet by mouth daily.   Trulicity 6.27 OJ/5.0KX Sopn Generic drug: Dulaglutide Inject into the skin.        Signature:  Chesley Mires, MD [place] Pager - [phone] 03/12/2021, 10:44 AM

## 2021-03-12 NOTE — Patient Instructions (Signed)
Will arrange for home sleep study Will call to arrange for follow up after sleep study reviewed  

## 2021-03-13 ENCOUNTER — Ambulatory Visit: Payer: Self-pay | Admitting: *Deleted

## 2021-03-13 NOTE — Telephone Encounter (Signed)
C/o multiple issues. Patient requesting if he can stop jardiance for the weekend to see if he starts feeling better. C/o dizziness , lightheadedness, room spinning with eyes closed, falling x 2 over the past 2 weeks. Last seen in ED in August for a fall. C/o nausea bilateral LE weakness, difficulty walking at times, no energy. Patient concerned medication jardiance side effects making him feel weakness and no energy. C/o no appetite, feels dehydrated although he drinks 2 20 oz bottles of water a day. C/o weight gain on 317 lbs today and was 300 last week. Reports he has not checked blood glucose today and took blood glucose and result was 80 now. Patient reports he ate a pack of "nabs" this am . Normal blood glucose runs in the 110-115's. Patient noted with slow, slurred speech. Patient denies weakness of either side of body or other neurological deficits. Refused to go to ED. Patient feels how he is feeling may be due to medication. Please advise . Next appt scheduled for 04/20/21. Care advise given. Patient verbalized understanding of care advise and to call back or go to ED if symptoms worsen.

## 2021-03-13 NOTE — Telephone Encounter (Signed)
Patient advised as below. Patient reports he will hold jardiance for now and will call with any new updates. Patient advised to go to ER if symptoms are worsening.

## 2021-03-13 NOTE — Telephone Encounter (Signed)
If any of these sx are new or worsening he needs to go to ER. Otherwise he can put Jardiance on hold for a few days and call back next week to let Dr. Carmin Richmond know if he is doing any better.

## 2021-03-13 NOTE — Telephone Encounter (Signed)
Reason for Disposition  Taking a medicine that could cause dizziness (e.g., blood pressure medications, diuretics)  Answer Assessment - Initial Assessment Questions 1. DESCRIPTION: "Describe your dizziness."     Room spinning when standing  2. LIGHTHEADED: "Do you feel lightheaded?" (e.g., somewhat faint, woozy, weak upon standing)     Weak upon standing  3. VERTIGO: "Do you feel like either you or the room is spinning or tilting?" (i.e. vertigo)     Room spinning  4. SEVERITY: "How bad is it?"  "Do you feel like you are going to faint?" "Can you stand and walk?"   - MILD: Feels slightly dizzy, but walking normally.   - MODERATE: Feels unsteady when walking, but not falling; interferes with normal activities (e.g., school, work).   - SEVERE: Unable to walk without falling, or requires assistance to walk without falling; feels like passing out now.      Moderate  5. ONSET:  "When did the dizziness begin?"     2-3 weeks ago  6. AGGRAVATING FACTORS: "Does anything make it worse?" (e.g., standing, change in head position)     Standing . Close eyes get dizzy  7. HEART RATE: "Can you tell me your heart rate?" "How many beats in 15 seconds?"  (Note: not all patients can do this)       na 8. CAUSE: "What do you think is causing the dizziness?"    Need fluids  9. RECURRENT SYMPTOM: "Have you had dizziness before?" If Yes, ask: "When was the last time?" "What happened that time?"     Yes . 10. OTHER SYMPTOMS: "Do you have any other symptoms?" (e.g., fever, chest pain, vomiting, diarrhea, bleeding)       Weakness bilateral legs, dizziness, room spinning  11. PREGNANCY: "Is there any chance you are pregnant?" "When was your last menstrual period?"       na  Protocols used: Dizziness - Lightheadedness-A-AH

## 2021-03-15 DIAGNOSIS — K7460 Unspecified cirrhosis of liver: Secondary | ICD-10-CM | POA: Diagnosis not present

## 2021-03-15 DIAGNOSIS — K766 Portal hypertension: Secondary | ICD-10-CM | POA: Diagnosis not present

## 2021-03-15 DIAGNOSIS — K7581 Nonalcoholic steatohepatitis (NASH): Secondary | ICD-10-CM | POA: Diagnosis not present

## 2021-03-16 DIAGNOSIS — K7460 Unspecified cirrhosis of liver: Secondary | ICD-10-CM | POA: Diagnosis not present

## 2021-03-16 DIAGNOSIS — K7581 Nonalcoholic steatohepatitis (NASH): Secondary | ICD-10-CM | POA: Diagnosis not present

## 2021-03-16 DIAGNOSIS — K766 Portal hypertension: Secondary | ICD-10-CM | POA: Diagnosis not present

## 2021-03-17 DIAGNOSIS — K7581 Nonalcoholic steatohepatitis (NASH): Secondary | ICD-10-CM | POA: Diagnosis not present

## 2021-03-17 DIAGNOSIS — K7460 Unspecified cirrhosis of liver: Secondary | ICD-10-CM | POA: Diagnosis not present

## 2021-03-17 DIAGNOSIS — K766 Portal hypertension: Secondary | ICD-10-CM | POA: Diagnosis not present

## 2021-03-30 ENCOUNTER — Emergency Department: Payer: BC Managed Care – PPO

## 2021-03-30 ENCOUNTER — Other Ambulatory Visit: Payer: Self-pay

## 2021-03-30 ENCOUNTER — Observation Stay
Admission: EM | Admit: 2021-03-30 | Discharge: 2021-04-01 | Disposition: A | Discharge status: Home / Self Care | Payer: BC Managed Care – PPO | Attending: Emergency Medicine | Admitting: Emergency Medicine

## 2021-03-30 DIAGNOSIS — I10 Essential (primary) hypertension: Secondary | ICD-10-CM | POA: Diagnosis not present

## 2021-03-30 DIAGNOSIS — K7460 Unspecified cirrhosis of liver: Secondary | ICD-10-CM

## 2021-03-30 DIAGNOSIS — D696 Thrombocytopenia, unspecified: Secondary | ICD-10-CM | POA: Diagnosis not present

## 2021-03-30 DIAGNOSIS — K7682 Hepatic encephalopathy: Secondary | ICD-10-CM | POA: Diagnosis not present

## 2021-03-30 DIAGNOSIS — R1011 Right upper quadrant pain: Secondary | ICD-10-CM

## 2021-03-30 DIAGNOSIS — N179 Acute kidney failure, unspecified: Secondary | ICD-10-CM

## 2021-03-30 DIAGNOSIS — M7989 Other specified soft tissue disorders: Secondary | ICD-10-CM

## 2021-03-30 DIAGNOSIS — R4182 Altered mental status, unspecified: Secondary | ICD-10-CM | POA: Diagnosis not present

## 2021-03-30 DIAGNOSIS — U071 COVID-19: Secondary | ICD-10-CM | POA: Diagnosis not present

## 2021-03-30 DIAGNOSIS — E119 Type 2 diabetes mellitus without complications: Secondary | ICD-10-CM | POA: Diagnosis not present

## 2021-03-30 DIAGNOSIS — R188 Other ascites: Secondary | ICD-10-CM

## 2021-03-30 DIAGNOSIS — R410 Disorientation, unspecified: Secondary | ICD-10-CM | POA: Diagnosis not present

## 2021-03-30 DIAGNOSIS — K7290 Hepatic failure, unspecified without coma: Secondary | ICD-10-CM

## 2021-03-30 DIAGNOSIS — Z91040 Latex allergy status: Secondary | ICD-10-CM | POA: Insufficient documentation

## 2021-03-30 DIAGNOSIS — Z79899 Other long term (current) drug therapy: Secondary | ICD-10-CM | POA: Insufficient documentation

## 2021-03-30 DIAGNOSIS — R059 Cough, unspecified: Secondary | ICD-10-CM | POA: Diagnosis not present

## 2021-03-30 DIAGNOSIS — D649 Anemia, unspecified: Secondary | ICD-10-CM

## 2021-03-30 DIAGNOSIS — J811 Chronic pulmonary edema: Secondary | ICD-10-CM | POA: Diagnosis not present

## 2021-03-30 DIAGNOSIS — I959 Hypotension, unspecified: Secondary | ICD-10-CM | POA: Diagnosis not present

## 2021-03-30 LAB — CBC
HCT: 22.9 % — ABNORMAL LOW (ref 39.0–52.0)
Hemoglobin: 8.1 g/dL — ABNORMAL LOW (ref 13.0–17.0)
MCH: 35.4 pg — ABNORMAL HIGH (ref 26.0–34.0)
MCHC: 35.4 g/dL (ref 30.0–36.0)
MCV: 100 fL (ref 80.0–100.0)
Platelets: 35 10*3/uL — ABNORMAL LOW (ref 150–400)
RBC: 2.29 MIL/uL — ABNORMAL LOW (ref 4.22–5.81)
RDW: 14.4 % (ref 11.5–15.5)
WBC: 4.9 10*3/uL (ref 4.0–10.5)
nRBC: 0 % (ref 0.0–0.2)

## 2021-03-30 LAB — COMPREHENSIVE METABOLIC PANEL
ALT: 27 U/L (ref 0–44)
AST: 54 U/L — ABNORMAL HIGH (ref 15–41)
Albumin: 2.7 g/dL — ABNORMAL LOW (ref 3.5–5.0)
Alkaline Phosphatase: 63 U/L (ref 38–126)
Anion gap: 9 (ref 5–15)
BUN: 31 mg/dL — ABNORMAL HIGH (ref 6–20)
CO2: 23 mmol/L (ref 22–32)
Calcium: 8.2 mg/dL — ABNORMAL LOW (ref 8.9–10.3)
Chloride: 101 mmol/L (ref 98–111)
Creatinine, Ser: 2.03 mg/dL — ABNORMAL HIGH (ref 0.61–1.24)
GFR, Estimated: 37 mL/min — ABNORMAL LOW (ref >60)
Glucose, Bld: 137 mg/dL — ABNORMAL HIGH (ref 70–99)
Potassium: 4.6 mmol/L (ref 3.5–5.1)
Sodium: 133 mmol/L — ABNORMAL LOW (ref 135–145)
Total Bilirubin: 3.7 mg/dL — ABNORMAL HIGH (ref 0.3–1.2)
Total Protein: 6.8 g/dL (ref 6.5–8.1)

## 2021-03-30 LAB — TROPONIN I (HIGH SENSITIVITY)
Troponin I (High Sensitivity): 16 ng/L (ref <18)
Troponin I (High Sensitivity): 18 ng/L — ABNORMAL HIGH (ref <18)

## 2021-03-30 LAB — AMMONIA: Ammonia: 47 umol/L — ABNORMAL HIGH (ref 9–35)

## 2021-03-30 MED ORDER — GUAIFENESIN-DM 100-10 MG/5ML PO SYRP
10.0000 mL | ORAL_SOLUTION | Freq: Once | ORAL | Status: AC
  Administered 2021-03-30: 10 mL via ORAL
  Filled 2021-03-30: qty 10

## 2021-03-30 MED ORDER — LACTULOSE 10 GM/15ML PO SOLN
20.0000 g | Freq: Once | ORAL | Status: AC
  Administered 2021-03-30: 20 g via ORAL
  Filled 2021-03-30: qty 30

## 2021-03-30 NOTE — ED Notes (Signed)
First Nurse Note; Pt via EMS from home. Per wife, pt has been more confused than normal. Pt was seen at [facility], pt Ammonia level was elevated and RBC was low. Pt is A&Ox4, pt answers question appropriately but intermittently confused.   VSS.

## 2021-03-30 NOTE — ED Provider Notes (Signed)
[facility] Emergency Department Provider Note ____________________________________________   Event Date/Time   First MD Initiated Contact with Patient 03/30/21 2140     (approximate)  I have reviewed the triage vital signs and the nursing notes.   HISTORY  Chief Complaint Altered Mental Status    HPI Jesse Zimmerman is a 60 y.o. male with PMH as noted below including NASH cirrhosis, diabetes, and hyperlipidemia presents with increased confusion over the last 1 to 2 days, gradual onset, and associated generalized weakness.  The patient states that this feels like when his ammonia has become elevated in the past.  In addition, the patient reports increased shortness of breath and abdominal distention.  He has a headache and some nausea but no vomiting or diarrhea.  He also reports cough.  He denies any abnormal bleeding.  Past Medical History:  Diagnosis Date   Chronic back pain    Cirrhosis ([place])    NASH   Diabetes mellitus without complication ([place])    Esophageal varices ([place])    Hyperlipidemia    NASH (nonalcoholic steatohepatitis)     Patient Active Problem List   Diagnosis Date Noted   Hearing loss, sensorineural, asymmetrical 03/27/2020   Pre-transplant evaluation for liver transplant 02/23/2020   Sepsis ([place]) 01/22/2020   ARF (acute renal failure) ([place]) 01/22/2020   Thrombocytopenia ([place]) 01/22/2020   Calculus of gallbladder with acute cholecystitis without obstruction 10/18/2019   Melena    Acute gastric ulcer with hemorrhage    Acute gastritis without hemorrhage    GI bleed 07/17/2018   Blood in stool    Secondary esophageal varices without bleeding ([place])    Portal hypertension ([place])    Upper GI bleed 04/17/2017   Contusion, back 12/26/2014   Bruising 12/26/2014   Back pain, chronic 11/22/2014   Diabetes mellitus type 2 in obese ([place]) 11/22/2014   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   Acid reflux 11/22/2014   HLD  (hyperlipidemia) 11/22/2014   BP (high blood pressure) 11/22/2014   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   Cannot sleep 11/22/2014   Cirrhosis ([place]) 11/22/2014   Adiposity 11/22/2014   Change in blood platelet count 11/22/2014   Avitaminosis D 11/22/2014   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   Esophageal and gastric varices ([place]) 05/08/2012    Past Surgical History:  Procedure Laterality Date   COLONOSCOPY WITH PROPOFOL N/A 08/12/2017   Procedure: COLONOSCOPY WITH PROPOFOL;  Surgeon: Jonathon Bellows, MD;  Location[facility] ENDOSCOPY;  Service: Gastroenterology;  Laterality: N/A;   ESOPHAGOGASTRODUODENOSCOPY (EGD) WITH PROPOFOL N/A 04/18/2017   Procedure: ESOPHAGOGASTRODUODENOSCOPY (EGD) WITH PROPOFOL;  Surgeon: [name], MD;  Location: [facility] ENDOSCOPY;  Service: Endoscopy;  Laterality: N/A;   ESOPHAGOGASTRODUODENOSCOPY (EGD) WITH PROPOFOL N/A 07/18/2018   Procedure: ESOPHAGOGASTRODUODENOSCOPY (EGD) WITH PROPOFOL;  Surgeon: [name], MD;  Location: [facility] ENDOSCOPY;  Service: Endoscopy;  Laterality: N/A;   EXCESSIVE THIGH / HIP / BUTTOCK / FLANK SKIN EXCISION     PART OF INNER THIGH/DUE TO SEPSIS INFECTION REMOVED   KIDNEY STONE SURGERY     removed   TONSILLECTOMY AND ADENOIDECTOMY      Prior to Admission medications   Medication Sig Start Date End Date Taking? Authorizing Provider  albuterol (VENTOLIN HFA) 108 (90 Base) MCG/ACT inhaler Inhale 2 puffs into the lungs every 6 (six) hours as needed for wheezing or shortness of breath. 10/22/20  Yes Jerrol Banana., MD  blood glucose meter kit and supplies Dispense based on patient and insurance preference. Use once daily as directed. (  FOR ICD-10 E11.69). 01/03/20  Yes Jerrol Banana., MD  Dulaglutide (TRULICITY) 4.00 QQ/7.6PP SOPN Inject 0.75 mg into the skin once a week. 05/09/20  Yes [provider]  empagliflozin (JARDIANCE) 25 MG TABS tablet Take 1 tablet (25 mg total) by mouth daily. 01/12/21  Yes Jerrol Banana., MD  furosemide  (LASIX) 20 MG tablet Take 2 tablets (40 mg total) by mouth daily. Patient taking differently: Take 20-40 mg by mouth 2 (two) times daily. Take two tablets in the morning and 1 tablet in the evening 01/31/20 03/30/21 Yes Jerrol Banana., MD  loratadine (CLARITIN) 10 MG tablet Take 1 tablet (10 mg total) by mouth daily. 01/12/21  Yes Jerrol Banana., MD  LORazepam (ATIVAN) 0.5 MG tablet Take 1 tablet (0.5 mg total) by mouth every 8 (eight) hours. 10/27/20  Yes Jerrol Banana., MD  omeprazole (PRILOSEC) 20 MG capsule Take 1 capsule (20 mg total) by mouth daily. 02/16/21  Yes Jerrol Banana., MD  propranolol (INDERAL) 20 MG tablet Take 1 tablet (20 mg total) by mouth 2 (two) times daily. 01/31/20  Yes Jerrol Banana., MD  sertraline (ZOLOFT) 50 MG tablet Take 1 tablet (50 mg total) by mouth daily. 10/27/20  Yes Jerrol Banana., MD  spironolactone (ALDACTONE) 100 MG tablet TAKE ONE-HALF TABLET BY  MOUTH TWICE DAILY FOR 1  WEEK AND THEN INCREASE TO 1 TABLET TWICE DAILY 01/13/21  Yes Jerrol Banana., MD  cetirizine (ZYRTEC) 10 MG tablet Take 1 tablet (10 mg total) by mouth daily. Patient not taking: No sig reported 10/22/20   Jerrol Banana., MD  Empagliflozin-metFORMIN HCl ER (SYNJARDY XR) 03-999 MG TB24 Take 1 tablet by mouth daily. Patient not taking: Reported on 03/30/2021 10/27/20   Jerrol Banana., MD    Allergies Perflutren lipid microspheres, Latex, and Pineapple  Family History  Problem Relation Age of Onset   Hypertension Mother    Breast cancer Mother    Dementia Mother    Heart attack Father    Gout Father    Hypertension Father    Hypertension Sister    Hypertension Sister    Colon cancer Maternal Aunt    Colon cancer Maternal Grandmother    Colon cancer Paternal Grandmother     Social History Social History   Tobacco Use   Smoking status: Never   Smokeless tobacco: Never  Substance Use Topics   Alcohol use: No   Drug  use: No    Review of Systems  Constitutional: No fever. Eyes: No visual changes. ENT: No sore throat. Cardiovascular: Denies chest pain. Respiratory: Positive for shortness of breath. Gastrointestinal: Positive for nausea. Genitourinary: Negative for dysuria.  Musculoskeletal: Negative for acute back pain. Skin: Negative for rash. Neurological: Positive for headache.     ____________________________________________   PHYSICAL EXAM:  VITAL SIGNS: ED Triage Vitals [03/30/21 1904]  Enc Vitals Group     BP (!) 155/61     Pulse Rate 62     Resp 20     Temp 98.9 F (37.2 C)     Temp Source Oral     SpO2 100 %     Weight (!) 320 lb (145.2 kg)     Height 6' (1.829 m)     Head Circumference      Peak Flow      Pain Score 0     Pain Loc  Pain Edu?      Excl. in [employer]?     Constitutional: Alert and oriented.  Somewhat chronically ill-appearing but in no acute distress. Eyes: Conjunctivae are normal.  Scleral icterus.  EOMI.  PERRLA. Head: Atraumatic. Nose: No congestion/rhinnorhea. Mouth/Throat: Mucous membranes are moist.   Neck: Normal range of motion.  Cardiovascular: Normal rate, regular rhythm.  Good peripheral circulation. Respiratory: Normal respiratory effort.  No retractions. Gastrointestinal: Soft and nontender.  Moderate distention.  Genitourinary: No flank tenderness. Musculoskeletal: 1+ bilateral lower extremity edema.  Extremities warm and well perfused.  Neurologic:  Normal speech and language.  Motor and sensory intact in all extremities.  Normal coordination.  No ataxia. Skin:  Skin is warm and dry. No rash noted. Psychiatric: Mood and affect are normal. Speech and behavior are normal.  ____________________________________________   LABS (all labs ordered are listed, but only abnormal results are displayed)  Labs Reviewed  CBC - Abnormal; Notable for the following components:      Result Value   RBC 2.29 (*)    Hemoglobin 8.1 (*)    HCT 22.9  (*)    MCH 35.4 (*)    Platelets 35 (*)    All other components within normal limits  COMPREHENSIVE METABOLIC PANEL - Abnormal; Notable for the following components:   Sodium 133 (*)    Glucose, Bld 137 (*)    BUN 31 (*)    Creatinine, Ser 2.03 (*)    Calcium 8.2 (*)    Albumin 2.7 (*)    AST 54 (*)    Total Bilirubin 3.7 (*)    GFR, Estimated 37 (*)    All other components within normal limits  AMMONIA - Abnormal; Notable for the following components:   Ammonia 47 (*)    All other components within normal limits  TROPONIN I (HIGH SENSITIVITY) - Abnormal; Notable for the following components:   Troponin I (High Sensitivity) 18 (*)    All other components within normal limits  RESP PANEL BY RT-PCR (FLU A&B, COVID) [id number]  URINALYSIS, COMPLETE (UACMP) WITH MICROSCOPIC  PROTIME-INR  TROPONIN I (HIGH SENSITIVITY)   ____________________________________________  EKG  ED ECG REPORT I, Arta Silence, the attending physician, personally viewed and interpreted this ECG.  Date: 03/30/2021 EKG Time: 1906 Rate: 63 Rhythm: normal sinus rhythm QRS Axis: normal Intervals: normal ST/T Wave abnormalities: normal Narrative Interpretation: no evidence of acute ischemia  ____________________________________________  RADIOLOGY  Chest x-ray interpreted by me shows pulmonary vascular congestion with no focal consolidation or edema CT head:  IMPRESSION:  No acute intracranial abnormality.    ____________________________________________   PROCEDURES  Procedure(s) performed: No  Procedures  Critical Care performed: No ____________________________________________   INITIAL IMPRESSION / ASSESSMENT AND PLAN / ED COURSE  Pertinent labs & imaging results that were available during my care of the patient were reviewed by me and considered in my medical decision making (see chart for details).   60 year old male with PMH as noted above including NASH cirrhosis diabetes, and  hyperlipidemia presents with increased confusion over the last few days, feeling like his ammonia may be high.  He also reports increased shortness of breath, abdominal distention, generalized weakness, and headache.  I reviewed the past medical records in [place].  The patient was most recently seen in the ED on 8/15 with weakness, fever, nausea.  He had an elevated lipase and was diagnosed with likely leg cellulitis.  He was discharged home.  On exam, the patient is somewhat chronically ill and  weak appearing but in no acute distress.  His vital signs are normal except for borderline elevated temperature.  Neurologic exam is normal.  He appears jaundiced and has moderate abdominal distention but no tenderness.  Initial lab work-up reveals slightly elevated ammonia.  Anemia and thrombocytopenia are unchanged from baseline and bilirubin slightly increased from prior.  Differential includes hepatic cephalopathy, fluid overload, COVID-19, UTI, or other infection; given the normal neuro exam I do not suspect ICH, however the patient is at increased risk with his thrombocytopenia so we will obtain a CT of the head.  I anticipate that the patient will need admission for treatment of hepatic encephalopathy, diuresis, and possible paracentesis.  ----------------------------------------- 11:44 PM on 03/30/2021 -----------------------------------------  CT head is negative.  I consulted Dr. Flossie Buffy from the hospitalist service for admission.   ____________________________________________   FINAL CLINICAL IMPRESSION(S) / ED DIAGNOSES  Final diagnoses:  Hepatic encephalopathy      NEW MEDICATIONS STARTED DURING THIS VISIT:  New Prescriptions   No medications on file     Note:  This document was prepared using Dragon voice recognition software and may include unintentional dictation errors.    Arta Silence, MD 03/30/21 [id number]

## 2021-03-30 NOTE — ED Provider Notes (Signed)
Emergency Medicine Provider Triage Evaluation Note  Jesse Zimmerman, a 60 y.o. male  was evaluated in triage.  Pt complains of confusion per wife, since last night. Patient arrives via EMS from home, with complaints of elevated ammonia levels as well as low red blood cells.  Patient denies any current pain, is able to answer questions appropriately.  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  Review of Systems  Positive: Confusion  Negative: FCS, ABD pain   Physical Exam  There were no vitals taken for this visit. Gen:   Awake, no distress  NAD Resp:  Normal effort CTA MSK:   Moves extremities without difficulty  Other:  ABD: obese, soft  Medical Decision Making  Medically screening exam initiated at 7:03 PM.  Appropriate orders placed.  Jesse Zimmerman was informed that the remainder of the evaluation will be completed by another provider, this initial triage assessment does not replace that evaluation, and the importance of remaining in the ED until their evaluation is complete.  Patient ED evaluation of confusion concerns over abnormal ammonia levels and low RBCs.   Jesse Needles, PA-C 03/30/21 1905    Naaman Plummer, MD 03/31/21 2103

## 2021-03-30 NOTE — ED Notes (Signed)
ED Provider at bedside. 

## 2021-03-30 NOTE — ED Triage Notes (Signed)
Pt in with co "feeling confused" since yesterday. Pt has hx of cirrhosis of the liver and states his ammonia gets elevated. Pt denies any pain at this time, pt also noted to be jaundiced.

## 2021-03-31 ENCOUNTER — Observation Stay: Admit: 2021-03-31 | Payer: BC Managed Care – PPO

## 2021-03-31 ENCOUNTER — Observation Stay: Payer: BC Managed Care – PPO

## 2021-03-31 DIAGNOSIS — R609 Edema, unspecified: Secondary | ICD-10-CM | POA: Diagnosis not present

## 2021-03-31 DIAGNOSIS — K7290 Hepatic failure, unspecified without coma: Secondary | ICD-10-CM

## 2021-03-31 DIAGNOSIS — K7682 Hepatic encephalopathy: Secondary | ICD-10-CM | POA: Diagnosis not present

## 2021-03-31 DIAGNOSIS — D696 Thrombocytopenia, unspecified: Secondary | ICD-10-CM | POA: Diagnosis not present

## 2021-03-31 DIAGNOSIS — U071 COVID-19: Secondary | ICD-10-CM | POA: Diagnosis not present

## 2021-03-31 DIAGNOSIS — N179 Acute kidney failure, unspecified: Secondary | ICD-10-CM

## 2021-03-31 DIAGNOSIS — R410 Disorientation, unspecified: Secondary | ICD-10-CM

## 2021-03-31 DIAGNOSIS — D649 Anemia, unspecified: Secondary | ICD-10-CM

## 2021-03-31 DIAGNOSIS — K7460 Unspecified cirrhosis of liver: Secondary | ICD-10-CM | POA: Diagnosis not present

## 2021-03-31 DIAGNOSIS — R4182 Altered mental status, unspecified: Secondary | ICD-10-CM | POA: Diagnosis not present

## 2021-03-31 DIAGNOSIS — Z79899 Other long term (current) drug therapy: Secondary | ICD-10-CM | POA: Diagnosis not present

## 2021-03-31 DIAGNOSIS — R188 Other ascites: Secondary | ICD-10-CM | POA: Diagnosis not present

## 2021-03-31 DIAGNOSIS — E119 Type 2 diabetes mellitus without complications: Secondary | ICD-10-CM | POA: Diagnosis not present

## 2021-03-31 DIAGNOSIS — I10 Essential (primary) hypertension: Secondary | ICD-10-CM | POA: Diagnosis not present

## 2021-03-31 DIAGNOSIS — N1832 Chronic kidney disease, stage 3b: Secondary | ICD-10-CM | POA: Diagnosis not present

## 2021-03-31 DIAGNOSIS — Z91040 Latex allergy status: Secondary | ICD-10-CM | POA: Diagnosis not present

## 2021-03-31 LAB — BODY FLUID CELL COUNT WITH DIFFERENTIAL
Eos, Fluid: 0 %
Lymphs, Fluid: 48 %
Monocyte-Macrophage-Serous Fluid: 51 %
Neutrophil Count, Fluid: 1 %
Total Nucleated Cell Count, Fluid: 1066 cu mm

## 2021-03-31 LAB — RESP PANEL BY RT-PCR (FLU A&B, COVID) ARPGX2
Influenza A by PCR: NEGATIVE
Influenza B by PCR: NEGATIVE
SARS Coronavirus 2 by RT PCR: POSITIVE — AB

## 2021-03-31 LAB — GLUCOSE, PLEURAL OR PERITONEAL FLUID: Glucose, Fluid: 157 mg/dL

## 2021-03-31 LAB — LACTATE DEHYDROGENASE, PLEURAL OR PERITONEAL FLUID: LD, Fluid: 53 U/L — ABNORMAL HIGH (ref 3–23)

## 2021-03-31 LAB — PROTIME-INR
INR: 1.8 — ABNORMAL HIGH (ref 0.8–1.2)
Prothrombin Time: 21 seconds — ABNORMAL HIGH (ref 11.4–15.2)

## 2021-03-31 MED ORDER — LORATADINE 10 MG PO TABS
10.0000 mg | ORAL_TABLET | Freq: Every day | ORAL | Status: DC
  Administered 2021-03-31 – 2021-04-01 (×2): 10 mg via ORAL
  Filled 2021-03-31 (×2): qty 1

## 2021-03-31 MED ORDER — SERTRALINE HCL 50 MG PO TABS
50.0000 mg | ORAL_TABLET | Freq: Every day | ORAL | Status: DC
Start: 2021-03-31 — End: 2021-04-01
  Administered 2021-03-31: 50 mg via ORAL
  Filled 2021-03-31 (×2): qty 1

## 2021-03-31 MED ORDER — ONDANSETRON HCL 4 MG/2ML IJ SOLN
4.0000 mg | INTRAMUSCULAR | Status: DC | PRN
  Administered 2021-03-31: 4 mg via INTRAVENOUS
  Filled 2021-03-31: qty 2

## 2021-03-31 MED ORDER — PROPRANOLOL HCL 20 MG PO TABS: 20.0000 mg | ORAL_TABLET | Freq: Two times a day (BID) | ORAL | Status: DC

## 2021-03-31 MED ORDER — ALBUTEROL SULFATE HFA 108 (90 BASE) MCG/ACT IN AERS
2.0000 | INHALATION_SPRAY | Freq: Four times a day (QID) | RESPIRATORY_TRACT | Status: DC | PRN
  Filled 2021-03-31: qty 6.7

## 2021-03-31 MED ORDER — SODIUM CHLORIDE 0.9 % IV SOLN
200.0000 mg | Freq: Once | INTRAVENOUS | Status: AC
  Administered 2021-03-31: 200 mg via INTRAVENOUS
  Filled 2021-03-31: qty 200

## 2021-03-31 MED ORDER — SODIUM CHLORIDE 0.9 % IV SOLN: 100.0000 mg | Freq: Every day | INTRAVENOUS | Status: DC

## 2021-03-31 MED ORDER — GUAIFENESIN-CODEINE 100-10 MG/5ML PO SOLN
10.0000 mL | Freq: Four times a day (QID) | ORAL | Status: DC | PRN
  Administered 2021-03-31: 10 mL via ORAL
  Filled 2021-03-31: qty 10

## 2021-03-31 MED ORDER — PANTOPRAZOLE SODIUM 40 MG PO TBEC
40.0000 mg | DELAYED_RELEASE_TABLET | Freq: Every day | ORAL | Status: DC
Start: 2021-03-31 — End: 2021-04-01
  Administered 2021-03-31 – 2021-04-01 (×2): 40 mg via ORAL
  Filled 2021-03-31 (×2): qty 1

## 2021-03-31 NOTE — Procedures (Signed)
PROCEDURE SUMMARY:  Successful US guided paracentesis from RLQ.  Yielded 3.2 Liters of clear yellow fluid.  No immediate complications.  Pt tolerated well.   Specimen was sent for labs.  EBL < 67m  MRockney Ghee10/04/2021 12:35 PM

## 2021-03-31 NOTE — Progress Notes (Signed)
No charge progress note.   Jesse Zimmerman is a 60 y.o. 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 He was found to be positive for COVID-19.  Remained afebrile, had mild nausea and vomiting yesterday.  No shortness of breath, only mild cough for few days.  On room air. Ammonia levels were elevated at 47.  Remdesivir was ordered by the admitting provider, received initial dose. Worsening liver functions. Mental status improved. Getting paracentesis as ordered by admitting provider.  Patient appears to be at baseline when seen during morning rounds. He was getting paracentesis at bedside.  No abdominal pain.  -Stop remdesivir-can do outpatient Paxilovid on discharge tomorrow. -Follow-up paracentesis labs-less likely SBP. -Most likely will be going home tomorrow.

## 2021-03-31 NOTE — Consult Note (Signed)
[facility] Consult Note: 03/31/21    Date of Admission:  03/30/2021           Reason for Consult: AKI    Referring Provider: Lorella Nimrod, MD Primary Care Provider: Jerrol Banana., MD   History of Presenting Illness:  Jesse CALABRETTA is a 60 y.o. male who presented to the emergency room from home by EMS for confusion.  Patient was diagnosed with COVID.  This morning he is fully alert and oriented and feels like he is back to his usual self. He reports that the night before admission, something did not feel right and he started to act confused.  Nephrology consult is requested for evaluation of elevated creatinine His baseline creatinine appears to be 1.56 from February 16, 2021 Admission creatinine is 2.03/GFR 37 At baseline patient has sequelae of liver dysfunction.  Patient is followed at [facility] liver clinic for liver cirrhosis secondary to [facility].  Upon admission, patient was also diagnosed with COVID 19   Review of Systems: ROS Gen: Denies any fevers or chills at present. HEENT: Hearing loss right greater than left CV: No chest pain or shortness of breath Resp: Does have cough GI: No nausea, vomiting or diarrhea.  No blood in the stool at present.  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  No hematuria.  Patient knows about CKD  MS: Ambulatory.  Denies any acute joint pain or swelling Derm:   No complaints Psych: No complaints Heme: No complaints Neuro: No complaints Endocrine: No complaints   Past Medical History:  Diagnosis Date   Chronic back pain    Cirrhosis ([place])    NASH   Diabetes mellitus without complication (HCC)    Esophageal varices (HCC)    Hyperlipidemia    NASH (nonalcoholic steatohepatitis)     Social History   Tobacco Use   Smoking status: Never   Smokeless tobacco: Never  Substance Use Topics   Alcohol use: No   Drug use: No    Family History  Problem  Relation Age of Onset   Hypertension Mother    Breast cancer Mother    Dementia Mother    Heart attack Father    Gout Father    Hypertension Father    Hypertension Sister    Hypertension Sister    Colon cancer Maternal Aunt    Colon cancer Maternal Grandmother    Colon cancer Paternal Grandmother      OBJECTIVE: Blood pressure (!) 143/53, pulse 67, temperature 99.3 F (37.4 C), temperature source Oral, resp. rate 20, height 6' (1.829 m), weight (!) 145.2 kg, SpO2 99 %.  Physical Exam Physical Exam: General:  No acute distress, sitting up on the side of bed  HEENT  anicteric, moist oral mucous membrane  Pulm/lungs  normal breathing effort, coarse breath sounds  CVS/Heart  regular rhythm, no rub or gallop  Abdomen:   Soft, obese, nontender, dependent edema in the pannus  Extremities:  + peripheral edema  Neurologic:  Alert, oriented, able to follow commands  Skin:  No acute rashes     Lab Results Lab Results  Component Value Date   WBC 4.9 03/30/2021   HGB 8.1 (L) 03/30/2021   HCT 22.9 (L) 03/30/2021   MCV 100.0 03/30/2021   PLT 35 (L) 03/30/2021    Lab Results  Component Value Date   CREATININE 2.03 (H) 03/30/2021   BUN 31 (H) 03/30/2021   NA 133 (  L) 03/30/2021   K 4.6 03/30/2021   CL 101 03/30/2021   CO2 23 03/30/2021    Lab Results  Component Value Date   ALT 27 03/30/2021   AST 54 (H) 03/30/2021   ALKPHOS 63 03/30/2021   BILITOT 3.7 (H) 03/30/2021     Microbiology: Recent Results (from the past 240 hour(s))  Resp Panel by RT-PCR (Flu A&B, Covid) Nasopharyngeal Swab     Status: Abnormal   Collection Time: 03/30/21 10:16 PM   Specimen: Nasopharyngeal Swab; Nasopharyngeal(NP) swabs in vial transport medium  Result Value Ref Range Status   SARS Coronavirus 2 by RT PCR POSITIVE (A) NEGATIVE Final    Comment: RESULT CALLED TO, READ BACK BY AND VERIFIED WITH: Tiffany Dean@[email or web address]  10/11 22RH (NOTE) SARS-CoV-2 target nucleic acids are DETECTED.  The  SARS-CoV-2 RNA is generally detectable in upper respiratory specimens during the acute phase of infection. Positive results are indicative of the presence of the identified virus, but do not rule out bacterial infection or co-infection with other pathogens not detected by the test. Clinical correlation with patient history and other diagnostic information is necessary to determine patient infection status. The expected result is Negative.  Fact Sheet for Patients: EntrepreneurPulse.com.au  Fact Sheet for Healthcare Providers: IncredibleEmployment.be  This test is not yet approved or cleared by the [place] FDA and  has been authorized for detection and/or diagnosis of SARS-CoV-2 by FDA under an Emergency Use Authorization (EUA).  This EUA will remain in effect (meaning this test can be used ) for the duration of  the COVID-19 declaration under Section 564(b)(1) of the Act, 21 U.S.C. section 360bbb-3(b)(1), unless the authorization is terminated or revoked sooner.     Influenza A by PCR NEGATIVE NEGATIVE Final   Influenza B by PCR NEGATIVE NEGATIVE Final    Comment: (NOTE) The Xpert Xpress SARS-CoV-2/FLU/RSV plus assay is intended as an aid in the diagnosis of influenza from Nasopharyngeal swab specimens and should not be used as a sole basis for treatment. Nasal washings and aspirates are unacceptable for Xpert Xpress SARS-CoV-2/FLU/RSV testing.  Fact Sheet for Patients: EntrepreneurPulse.com.au  Fact Sheet for Healthcare Providers: IncredibleEmployment.be  This test is not yet approved or cleared by the [place] FDA and has been authorized for detection and/or diagnosis of SARS-CoV-2 by FDA under an Emergency Use Authorization (EUA). This EUA will remain in effect (meaning this test can be used) for the duration of the COVID-19 declaration under Section 564(b)(1) of the Act, 21 U.S.C. section  360bbb-3(b)(1), unless the authorization is terminated or revoked.  Performed at [facility], [address] [place], [place] [address]     Medications: Scheduled Meds:  loratadine  10 mg Oral Daily   pantoprazole  40 mg Oral Daily   sertraline  50 mg Oral Daily   Continuous Infusions: PRN Meds:.albuterol, ondansetron (ZOFRAN) IV  Allergies  Allergen Reactions   Perflutren Lipid Microspheres     Other reaction(s): Other (See Comments) Back pain   Latex     LATEX TAPE   Pineapple Hives    Urinalysis: No results for input(s): COLORURINE, LABSPEC, PHURINE, GLUCOSEU, HGBUR, BILIRUBINUR, KETONESUR, PROTEINUR, UROBILINOGEN, NITRITE, LEUKOCYTESUR in the last 72 hours.  Invalid input(s): APPERANCEUR    Imaging: DG Chest 2 View  Result Date: 03/30/2021 CLINICAL DATA:  cough EXAM: CHEST - 2 VIEW COMPARISON:  02/02/2021. FINDINGS: Low lung volumes with bronchovascular crowding and central pulmonary vascular congestion. No consolidation. No visible pleural effusions or pneumothorax. Similar borderline mild cardiac  enlargement. No acute osseous abnormality. IMPRESSION: Similar low lung volumes and central pulmonary vascular congestion. No consolidation to suggest pneumonia. Electronically Signed   By: Margaretha Sheffield M.D.   On: 03/30/2021 19:36   CT Head Wo Contrast  Result Date: 03/30/2021 CLINICAL DATA:  Altered mental status, confusion, hyperammonemic EXAM: CT HEAD WITHOUT CONTRAST TECHNIQUE: Contiguous axial images were obtained from the base of the skull through the vertex without intravenous contrast. COMPARISON:  None. FINDINGS: Brain: Normal anatomic configuration. No abnormal intra or extra-axial mass lesion or fluid collection. No abnormal mass effect or midline shift. No evidence of acute intracranial hemorrhage or infarct. Ventricular size is normal. Cerebellum unremarkable. Vascular: Unremarkable Skull: Intact Sinuses/Orbits: Paranasal sinuses are clear. Orbits  are unremarkable. Other: Mastoid air cells and middle ear cavities are clear. IMPRESSION: No acute intracranial abnormality. Electronically Signed   By: Fidela Salisbury M.D.   On: 03/30/2021 23:18   US Paracentesis  Result Date: 03/31/2021 INDICATION: Cirrhosis with ascites request received for diagnostic and therapeutic paracentesis. EXAM: ULTRASOUND GUIDED DIAGNOSTIC AND THERAPEUTIC PARACENTESIS MEDICATIONS: Local 1% lidocaine only COMPLICATIONS: None immediate. PROCEDURE: Informed written consent was obtained from the patient after a discussion of the risks, benefits and alternatives to treatment. A timeout was performed prior to the initiation of the procedure. Initial ultrasound scanning demonstrates a large amount of ascites within the right lower abdominal quadrant. The right lower abdomen was prepped and draped in the usual sterile fashion. 1% lidocaine was used for local anesthesia. Following this, a 19 gauge, 7-cm, Yueh catheter was introduced. An ultrasound image was saved for documentation purposes. The paracentesis was performed. The catheter was removed and a dressing was applied. The patient tolerated the procedure well without immediate post procedural complication. FINDINGS: A total of approximately 3.2 L of clear yellow fluid was removed. Samples were sent to the laboratory as requested by the clinical team. IMPRESSION: Successful ultrasound-guided paracentesis yielding 3.2 liters of peritoneal fluid. Read By: Tsosie Billing PA-C Electronically Signed   By: Aletta Edouard M.D.   On: 03/31/2021 12:53      Assessment/Plan:  Jesse Zimmerman is a 60 y.o. male with medical problems of liver cirrhosis secondary to [name], morbid obesity, hyperlipidemia, diabetes, esophageal and gastric varices, portal hypertension, history of GI bleed, history of adenomatous polyp of colon, gallbladder stones, hearing loss in the right ear, obstructive sleep apnea, nonobstructive kidney stones    was admitted on  03/30/2021 for :  Hepatic encephalopathy [K76.82] Ascites [R18.8] AMS (altered mental status) [R41.82]  #Acute kidney injury on chronic kidney disease stage IIIb Baseline creatinine of 1.56 from 02/16/2021.  Admission creatinine 2.03, GFR 37 Recent renal imaging on September 25-CT abdomen with IV contrast-showed cirrhotic liver, portal hypertension, splenomegaly, body wall anasarca AKI likely secondary to hemodynamic variation versus ATN from IV contrast exposure recently. -Continue conservative management -Agree with holding Jardiance, furosemide, spironolactone for now -These medications can be started if serum creatinine is stable/improved tomorrow  #Nonalcoholic liver cirrhosis with varices, anasarca, splenomegaly, thrombocytopenia Patient underwent paracentesis on 03/31/2021.  3.2 L of fluid was removed  #Body wall anasarca and lower extremity edema Patient will be able to start oral furosemide tomorrow provided no hypotension and serum creatinine is stable or improved.  Jesse Zimmerman Candiss Norse 03/31/21

## 2021-03-31 NOTE — ED Notes (Signed)
Patient c/o nausea.  MD paged for PRN order.  Patient and family member made aware

## 2021-03-31 NOTE — Progress Notes (Signed)
Remdesivir - Pharmacy Brief Note   O:  ALT: 6.8 CXR:  SpO2: 96 % on RA    A/P:  Remdesivir 200 mg IVPB once followed by 100 mg IVPB daily x 4 days.   Theodoro Koval D 03/31/2021 1:42 AM

## 2021-03-31 NOTE — H&P (Addendum)
History and Physical    Jesse Zimmerman GEX:[id number] DOB: 04-29-1961 DOA: 03/30/2021  PCP: Jerrol Banana., MD  Patient coming from: Home  I have personally briefly reviewed patient's old medical records in [place]  Chief Complaint: Confusion  HPI: Jesse Zimmerman is a 60 y.o. 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  Wife at bedside helps to provide most of the history.  State yesterday patient appeared confused stating he was freezing but then will take off his clothes repeatedly.  Today he also noticed increasing shortness of breath, had nausea and vomiting.  Reportedly had temperature of 100.8 on EMS arrival.  He also reports increasing abdominal distention for the past month and recently had his diuretic decreased due to worsening renal insufficiency.  Has history of paracentesis several years ago.  Denies any abdominal pain.  ED Course: He had temperature of 99 F, blood pressure 1 55-61 stable on room air.  No leukocytosis.  Hemoglobin stable around baseline 8.1.  Chronic thrombocytopenia with platelet of 35.  Sodium of 133, potassium of 4.6, creatinine of 2.03 from prior of 1.56.  Troponin of 18. Ammonia level of 47.  CT head was obtained due to concern for thrombocytopenia but was negative. Chest x-ray showed vascular congestion.  COVID PCR later returned positive  Review of Systems: Constitutional: No Weight Change, + Fever ENT/Mouth: No sore throat, No Rhinorrhea Eyes: No Vision Changes Cardiovascular: No Chest Pain, +SOB,+ Edema, Respiratory: + Cough, No Sputum, No Wheezing, no Dyspnea  Gastrointestinal: + Nausea, + Vomiting, No Diarrhea, No Constipation, No Pain Genitourinary: no Urinary Incontinence Musculoskeletal: No Arthralgias, No Myalgias Skin: No Skin Lesions, No  Pruritus, Neuro: no Weakness, No Numbness Psych: No Anxiety/Panic, No Depression, no decrease appetite Heme/Lymph: No Bruising, No Bleeding  Past Medical History:  Diagnosis Date   Chronic back pain    Cirrhosis ([place])    NASH   Diabetes mellitus without complication (HCC)    Esophageal varices (HCC)    Hyperlipidemia    NASH (nonalcoholic steatohepatitis)     Past Surgical History:  Procedure Laterality Date   COLONOSCOPY WITH PROPOFOL N/A 08/12/2017   Procedure: COLONOSCOPY WITH PROPOFOL;  Surgeon: Jonathon Bellows, MD;  Location[facility] ENDOSCOPY;  Service: Gastroenterology;  Laterality: N/A;   ESOPHAGOGASTRODUODENOSCOPY (EGD) WITH PROPOFOL N/A 04/18/2017   Procedure: ESOPHAGOGASTRODUODENOSCOPY (EGD) WITH PROPOFOL;  Surgeon: [name], MD;  Location: [facility] ENDOSCOPY;  Service: Endoscopy;  Laterality: N/A;   ESOPHAGOGASTRODUODENOSCOPY (EGD) WITH PROPOFOL N/A 07/18/2018   Procedure: ESOPHAGOGASTRODUODENOSCOPY (EGD) WITH PROPOFOL;  Surgeon: [name], MD;  Location: [facility] ENDOSCOPY;  Service: Endoscopy;  Laterality: N/A;   EXCESSIVE THIGH / HIP / BUTTOCK / FLANK SKIN EXCISION     PART OF INNER THIGH/DUE TO SEPSIS INFECTION REMOVED   KIDNEY STONE SURGERY     removed   TONSILLECTOMY AND ADENOIDECTOMY       reports that he has never smoked. He has never used smokeless tobacco. He reports that he does not drink alcohol and does not use drugs. Social History  Allergies  Allergen Reactions   Perflutren Lipid Microspheres     Other reaction(s): Other (See Comments) Back pain   Latex     LATEX TAPE   Pineapple Hives    Family History  Problem Relation Age of Onset   Hypertension Mother    Breast cancer Mother  Dementia Mother    Heart attack Father    Gout Father    Hypertension Father    Hypertension Sister    Hypertension Sister    Colon cancer Maternal Aunt    Colon cancer Maternal Grandmother    Colon cancer Paternal Grandmother      Prior to Admission medications    Medication Sig Start Date End Date Taking? Authorizing Provider  albuterol (VENTOLIN HFA) 108 (90 Base) MCG/ACT inhaler Inhale 2 puffs into the lungs every 6 (six) hours as needed for wheezing or shortness of breath. 10/22/20  Yes Jerrol Banana., MD  blood glucose meter kit and supplies Dispense based on patient and insurance preference. Use once daily as directed. (FOR ICD-10 E11.69). 01/03/20  Yes Jerrol Banana., MD  Dulaglutide (TRULICITY) 2.35 TI/1.4ER SOPN Inject 0.75 mg into the skin once a week. 05/09/20  Yes [provider]  empagliflozin (JARDIANCE) 25 MG TABS tablet Take 1 tablet (25 mg total) by mouth daily. 01/12/21  Yes Jerrol Banana., MD  furosemide (LASIX) 20 MG tablet Take 2 tablets (40 mg total) by mouth daily. Patient taking differently: Take 20-40 mg by mouth 2 (two) times daily. Take two tablets in the morning and 1 tablet in the evening 01/31/20 03/30/21 Yes Jerrol Banana., MD  loratadine (CLARITIN) 10 MG tablet Take 1 tablet (10 mg total) by mouth daily. 01/12/21  Yes Jerrol Banana., MD  LORazepam (ATIVAN) 0.5 MG tablet Take 1 tablet (0.5 mg total) by mouth every 8 (eight) hours. 10/27/20  Yes Jerrol Banana., MD  omeprazole (PRILOSEC) 20 MG capsule Take 1 capsule (20 mg total) by mouth daily. 02/16/21  Yes Jerrol Banana., MD  propranolol (INDERAL) 20 MG tablet Take 1 tablet (20 mg total) by mouth 2 (two) times daily. 01/31/20  Yes Jerrol Banana., MD  sertraline (ZOLOFT) 50 MG tablet Take 1 tablet (50 mg total) by mouth daily. 10/27/20  Yes Jerrol Banana., MD  spironolactone (ALDACTONE) 100 MG tablet TAKE ONE-HALF TABLET BY  MOUTH TWICE DAILY FOR 1  WEEK AND THEN INCREASE TO 1 TABLET TWICE DAILY 01/13/21  Yes Jerrol Banana., MD  cetirizine (ZYRTEC) 10 MG tablet Take 1 tablet (10 mg total) by mouth daily. Patient not taking: No sig reported 10/22/20   Jerrol Banana., MD  Empagliflozin-metFORMIN HCl  ER (SYNJARDY XR) 03-999 MG TB24 Take 1 tablet by mouth daily. Patient not taking: Reported on 03/30/2021 10/27/20   Jerrol Banana., MD    Physical Exam: Vitals:   03/30/21 1904 03/30/21 2221  BP: (!) 155/61 (!) 144/47  Pulse: 62 66  Resp: 20 18  Temp: 98.9 F (37.2 C) 99 F (37.2 C)  TempSrc: Oral Oral  SpO2: 100% 100%  Weight: (!) 145.2 kg   Height: 6' (1.829 m)     Constitutional: NAD, calm, fatigue, drowsy ill-appearing male sitting upright in bed Vitals:   03/30/21 1904 03/30/21 2221  BP: (!) 155/61 (!) 144/47  Pulse: 62 66  Resp: 20 18  Temp: 98.9 F (37.2 C) 99 F (37.2 C)  TempSrc: Oral Oral  SpO2: 100% 100%  Weight: (!) 145.2 kg   Height: 6' (1.829 m)    Eyes: PERRL, lids and conjunctivae normal ENMT: Mucous membranes are moist.  Neck: normal, supple Respiratory: clear to auscultation bilaterally, no wheezing, no crackles. Normal respiratory effort. No accessory muscle use.  Cardiovascular: Regular rate and  rhythm, no murmurs / rubs / gallops.  +3 pitting edema of the distal lower extremity  abdomen: Large obese abdomen difficult to assess for distention.  No fluid wave.  No tenderness, no masses palpated.  Bowel sounds positive.  Musculoskeletal: no clubbing / cyanosis. No joint deformity upper and lower extremities. Good ROM, no contractures. Normal muscle tone.  Skin: no rashes, lesions, ulcers. No induration Neurologic: CN 2-12 grossly intact. Sensation intact, Strength 5/5 in all 4.  Psychiatric: Normal judgment and insight. Alert and oriented x 3. Normal mood.     Labs on Admission: I have personally reviewed following labs and imaging studies  CBC: Recent Labs  Lab 03/30/21 1907  WBC 4.9  HGB 8.1*  HCT 22.9*  MCV 100.0  PLT 35*   Basic Metabolic Panel: Recent Labs  Lab 03/30/21 1907  NA 133*  K 4.6  CL 101  CO2 23  GLUCOSE 137*  BUN 31*  CREATININE 2.03*  CALCIUM 8.2*   GFR: Estimated Creatinine Clearance: 58 mL/min (A) (by  C-G formula based on SCr of 2.03 mg/dL (H)). Liver Function Tests: Recent Labs  Lab 03/30/21 1907  AST 54*  ALT 27  ALKPHOS 63  BILITOT 3.7*  PROT 6.8  ALBUMIN 2.7*   No results for input(s): LIPASE, AMYLASE in the last 168 hours. Recent Labs  Lab 03/30/21 1907  AMMONIA 47*   Coagulation Profile: No results for input(s): INR, PROTIME in the last 168 hours. Cardiac Enzymes: No results for input(s): CKTOTAL, CKMB, CKMBINDEX, TROPONINI in the last 168 hours. BNP (last 3 results) No results for input(s): PROBNP in the last 8760 hours. HbA1C: No results for input(s): HGBA1C in the last 72 hours. CBG: No results for input(s): GLUCAP in the last 168 hours. Lipid Profile: No results for input(s): CHOL, HDL, LDLCALC, TRIG, CHOLHDL, LDLDIRECT in the last 72 hours. Thyroid Function Tests: No results for input(s): TSH, T4TOTAL, FREET4, T3FREE, THYROIDAB in the last 72 hours. Anemia Panel: No results for input(s): VITAMINB12, FOLATE, FERRITIN, TIBC, IRON, RETICCTPCT in the last 72 hours. Urine analysis:    Component Value Date/Time   COLORURINE AMBER (A) 01/22/2020 1805   APPEARANCEUR CLOUDY (A) 01/22/2020 1805   APPEARANCEUR Clear 06/11/2013 0902   LABSPEC 1.018 01/22/2020 1805   LABSPEC 1.031 06/11/2013 0902   PHURINE 5.0 01/22/2020 1805   GLUCOSEU >=500 (A) 01/22/2020 1805   GLUCOSEU >=500 06/11/2013 0902   HGBUR MODERATE (A) 01/22/2020 1805   BILIRUBINUR NEGATIVE 01/22/2020 1805   BILIRUBINUR negative 03/21/2019 1459   BILIRUBINUR Negative 06/11/2013 0902   KETONESUR NEGATIVE 01/22/2020 1805   PROTEINUR NEGATIVE 01/22/2020 1805   UROBILINOGEN 0.2 03/21/2019 1459   NITRITE NEGATIVE 01/22/2020 1805   LEUKOCYTESUR LARGE (A) 01/22/2020 1805   LEUKOCYTESUR 1+ 06/11/2013 0902    Radiological Exams on Admission: DG Chest 2 View  Result Date: 03/30/2021 CLINICAL DATA:  cough EXAM: CHEST - 2 VIEW COMPARISON:  02/02/2021. FINDINGS: Low lung volumes with bronchovascular  crowding and central pulmonary vascular congestion. No consolidation. No visible pleural effusions or pneumothorax. Similar borderline mild cardiac enlargement. No acute osseous abnormality. IMPRESSION: Similar low lung volumes and central pulmonary vascular congestion. No consolidation to suggest pneumonia. Electronically Signed   By: Margaretha Sheffield M.D.   On: 03/30/2021 19:36   CT Head Wo Contrast  Result Date: 03/30/2021 CLINICAL DATA:  Altered mental status, confusion, hyperammonemic EXAM: CT HEAD WITHOUT CONTRAST TECHNIQUE: Contiguous axial images were obtained from the base of the skull through the vertex without intravenous  contrast. COMPARISON:  None. FINDINGS: Brain: Normal anatomic configuration. No abnormal intra or extra-axial mass lesion or fluid collection. No abnormal mass effect or midline shift. No evidence of acute intracranial hemorrhage or infarct. Ventricular size is normal. Cerebellum unremarkable. Vascular: Unremarkable Skull: Intact Sinuses/Orbits: Paranasal sinuses are clear. Orbits are unremarkable. Other: Mastoid air cells and middle ear cavities are clear. IMPRESSION: No acute intracranial abnormality. Electronically Signed   By: Fidela Salisbury M.D.   On: 03/30/2021 23:18      Assessment/Plan  Acute metabolic encephalopathy - Ammonia level was mildly elevated at 47 and he was given a dose of lactulose in the ED.  However suspect this is more related to COVID so we will hold on any further lactulose. -CT head negative  COVID-19 viral infection -Start IV remdesivir due to high risk. Limited studies on cirrhotic patients  but no significant adverse effects noted. Monitor LFTs closely due to cirrhosis. Currently stable and normal.   Decompensated NASH cirrhosis with ascites -follows with [facility] and does not meet criteria for transplant due to low MELD score  - Had adjustment to diuretics on 10/4 due to worsening renal insufficency.  Previous on Lasix 86m in the AM, and  26min the PM but was decreased to 4057maily.  Spirolactone decrease from 100m59m 50mg49mowever notes increasing abdominal distention. Order [place] paracentesis to evaluate.  -low concern for SBP given no symptoms of abdominal pain.  Fever noted earlier more likely due to COVID infection  AKI in the setting of cirrhosis  -creatinine of 2.03 from 1.56 -recently had diuretic decreased by half by GI due to worsening renal insufficency - concerns of hepatorenal syndrome -Hold diuretics and beta blocker for now and avoid nephrotoxic agent. Follow repeat creatinine tomorrow. If no improvement, give IV albumin. -consider GI consult in the morning   Thrombocytopenia -plt of 35 which is around chronic baseline. No overt signs of bleeding  Anemia of chronic disease -Hgb stable at 8.1 which is baseline  Portal hypertension - Holding propanolol to assess for renal function and possible hepatorenal syndrome   DVT prophylaxis: SCDs Code Status: Full Family Communication: Plan discussed with patient and wife at bedside  disposition Plan: Home with at least 2 midnight stays  Consults called:  Admission status: inpatient  Level of care: Med-Surg  Status is: Observation  The patient remains OBS appropriate and will d/c before 2 midnights.  Dispo: The patient is from: Home              Anticipated d/c is to: Home              Patient currently is not medically stable to d/c.   Difficult to place patient No         [facility]   If 7PM-7AM, please contact night-coverage [email or web address]   03/31/2021, 1:12 AM

## 2021-04-01 ENCOUNTER — Ambulatory Visit: Payer: BC Managed Care – PPO | Admitting: Family Medicine

## 2021-04-01 DIAGNOSIS — R410 Disorientation, unspecified: Secondary | ICD-10-CM | POA: Diagnosis not present

## 2021-04-01 LAB — COMPREHENSIVE METABOLIC PANEL
ALT: 23 U/L (ref 0–44)
AST: 51 U/L — ABNORMAL HIGH (ref 15–41)
Albumin: 2.3 g/dL — ABNORMAL LOW (ref 3.5–5.0)
Alkaline Phosphatase: 49 U/L (ref 38–126)
Anion gap: 6 (ref 5–15)
BUN: 37 mg/dL — ABNORMAL HIGH (ref 6–20)
CO2: 24 mmol/L (ref 22–32)
Calcium: 8.1 mg/dL — ABNORMAL LOW (ref 8.9–10.3)
Chloride: 103 mmol/L (ref 98–111)
Creatinine, Ser: 2.25 mg/dL — ABNORMAL HIGH (ref 0.61–1.24)
GFR, Estimated: 33 mL/min — ABNORMAL LOW (ref >60)
Glucose, Bld: 118 mg/dL — ABNORMAL HIGH (ref 70–99)
Potassium: 4.2 mmol/L (ref 3.5–5.1)
Sodium: 133 mmol/L — ABNORMAL LOW (ref 135–145)
Total Bilirubin: 1.8 mg/dL — ABNORMAL HIGH (ref 0.3–1.2)
Total Protein: 5.9 g/dL — ABNORMAL LOW (ref 6.5–8.1)

## 2021-04-01 LAB — CBC WITH DIFFERENTIAL/PLATELET
Abs Immature Granulocytes: 0.02 10*3/uL (ref 0.00–0.07)
Basophils Absolute: 0 10*3/uL (ref 0.0–0.1)
Basophils Relative: 1 %
Eosinophils Absolute: 0.2 10*3/uL (ref 0.0–0.5)
Eosinophils Relative: 7 %
HCT: 19.3 % — ABNORMAL LOW (ref 39.0–52.0)
Hemoglobin: 6.9 g/dL — ABNORMAL LOW (ref 13.0–17.0)
Immature Granulocytes: 1 %
Lymphocytes Relative: 25 %
Lymphs Abs: 0.5 10*3/uL — ABNORMAL LOW (ref 0.7–4.0)
MCH: 35 pg — ABNORMAL HIGH (ref 26.0–34.0)
MCHC: 35.8 g/dL (ref 30.0–36.0)
MCV: 98 fL (ref 80.0–100.0)
Monocytes Absolute: 0.5 10*3/uL (ref 0.1–1.0)
Monocytes Relative: 23 %
Neutro Abs: 0.9 10*3/uL — ABNORMAL LOW (ref 1.7–7.7)
Neutrophils Relative %: 43 %
Platelets: 29 10*3/uL — CL (ref 150–400)
RBC: 1.97 MIL/uL — ABNORMAL LOW (ref 4.22–5.81)
RDW: 14.6 % (ref 11.5–15.5)
Smear Review: NORMAL
WBC: 2.1 10*3/uL — ABNORMAL LOW (ref 4.0–10.5)
nRBC: 0 % (ref 0.0–0.2)

## 2021-04-01 LAB — ALBUMIN, PLEURAL OR PERITONEAL FLUID: Albumin, Fluid: <1.5 g/dL

## 2021-04-01 LAB — PATHOLOGIST SMEAR REVIEW

## 2021-04-01 LAB — C-REACTIVE PROTEIN: CRP: 2.8 mg/dL — ABNORMAL HIGH (ref <1.0)

## 2021-04-01 MED ORDER — FUROSEMIDE 20 MG PO TABS: 40.0000 mg | ORAL_TABLET | Freq: Every day | ORAL | 2 refills | Status: DC

## 2021-04-01 MED ORDER — SPIRONOLACTONE 100 MG PO TABS: ORAL_TABLET | ORAL | 0 refills | Status: DC

## 2021-04-01 MED ORDER — GUAIFENESIN-DM 100-10 MG/5ML PO SYRP: 5.0000 mL | ORAL_SOLUTION | ORAL | 0 refills | Status: DC | PRN

## 2021-04-01 NOTE — Plan of Care (Signed)
Patient ID: Jesse Zimmerman, male   DOB: 07-18-1960, 60 y.o.   MRN: [id number]  Problem: Education: Goal: Knowledge of General Education information will improve Description: Including pain rating scale, medication(s)/side effects and non-pharmacologic comfort measures Outcome: Adequate for Discharge   Problem: Health Behavior/Discharge Planning: Goal: Ability to manage health-related needs will improve Outcome: Adequate for Discharge   Problem: Clinical Measurements: Goal: Ability to maintain clinical measurements within normal limits will improve Outcome: Adequate for Discharge Goal: Will remain free from infection Outcome: Adequate for Discharge Goal: Diagnostic test results will improve Outcome: Adequate for Discharge Goal: Respiratory complications will improve Outcome: Adequate for Discharge Goal: Cardiovascular complication will be avoided Outcome: Adequate for Discharge   Problem: Activity: Goal: Risk for activity intolerance will decrease Outcome: Adequate for Discharge   Problem: Nutrition: Goal: Adequate nutrition will be maintained Outcome: Adequate for Discharge   Problem: Coping: Goal: Level of anxiety will decrease Outcome: Adequate for Discharge   Problem: Elimination: Goal: Will not experience complications related to bowel motility Outcome: Adequate for Discharge Goal: Will not experience complications related to urinary retention Outcome: Adequate for Discharge   Problem: Pain Managment: Goal: General experience of comfort will improve Outcome: Adequate for Discharge   Problem: Safety: Goal: Ability to remain free from injury will improve Outcome: Adequate for Discharge   Problem: Skin Integrity: Goal: Risk for impaired skin integrity will decrease Outcome: Adequate for Discharge    Haydee Salter, RN

## 2021-04-01 NOTE — Progress Notes (Signed)
Patient ID: JACOBE STUDY, male   DOB: 1961-04-23, 60 y.o.   MRN: [id number]   CBC Latest Ref Rng & Units 04/01/2021 03/30/2021 02/16/2021  WBC 4.0 - 10.5 K/uL 2.1(L) 4.9 4.6  Hemoglobin 13.0 - 17.0 g/dL 6.9(L) 8.1(L) 8.2(L)  Hematocrit 39.0 - 52.0 % 19.3(L) 22.9(L) 23.3(L)  Platelets 150 - 400 K/uL 29(LL) 35(L) 38(LL)   Critical Platelet. MD aware. WNL for patient.  Haydee Salter, RN

## 2021-04-01 NOTE — Progress Notes (Signed)
      Patient ID: Jesse Zimmerman, male   DOB: Feb 14, 1961, 60 y.o.   MRN: [id number]   An After Visit Summary was printed and given to the patient.   Patient education given on medication changes, follow up appointments and Covid Precautions/Care and the patient expresses understanding and acceptance of instructions.   Wife to drive home.  Haydee Salter 04/01/2021 12:49 PM

## 2021-04-01 NOTE — Discharge Summary (Signed)
Jesse Zimmerman QAS:[id number] DOB: 03-Oct-1960 DOA: 03/30/2021  PCP: Jerrol Banana., MD  Admit date: 03/30/2021 Discharge date: 04/01/2021  Time spent: 45 minutes  Recommendations for Outpatient Follow-up:  Close f/u with hepatologly, will need assessment of kidney function     Discharge Diagnoses:  Principal Problem:   AMS (altered mental status) Active Problems:   Thrombocytopenia ([place])   COVID-19 virus infection   Decompensated hepatic cirrhosis ([place])   AKI (acute kidney injury) ([place])   Anemia   Discharge Condition: stable  Diet recommendation: low sodium  Filed Weights   03/30/21 1904  Weight: (!) 145.2 kg    History of present illness:  Jesse Zimmerman is a 60 y.o. 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   Wife at bedside helps to provide most of the history.  State yesterday patient appeared confused stating he was freezing but then will take off his clothes repeatedly.  Today he also noticed increasing shortness of breath, had nausea and vomiting.  Reportedly had temperature of 100.8 on EMS arrival.  He also reports increasing abdominal distention for the past month and recently had his diuretic decreased due to worsening renal insufficiency.  Has history of paracentesis several years ago.  Denies any abdominal pain.  Hospital Course:  Patient presented with cough and body aches and fatigue and encephalopathy. Symptoms began 10/7. Found to be covid positive. Encephalopathy had actually resolved the day prior to admission and his mental status was normal here. Mild fever. Cough but no dyspnea or O2 requirement. Tolerating fluids. Paracentesis yielded 3 L of fluid, assessment not consistent with SBP. Patient's diuretic dose recently decreased due to worsening kidney function and creatinine here  is low 2 s from baseline 1.5-2. I have advised very close f/u with hepatology for monitoring of kidney function and further adjustment of diuretics. One dose remdesevir given here. No steroids given no o2 requirement. Will not rx paxlovid as is now 5 days from symptom onset and symptoms significantly improved.   Procedures: paracentesis   Consultations: IR  Discharge Exam: Vitals:   03/31/21 1941 04/01/21 0517  BP: (!) 126/49 (!) 125/54  Pulse: 66 (!) 57  Resp: 18 17  Temp: 98.3 F (36.8 C) 97.9 F (36.6 C)  SpO2: 100% 99%    General: NAD Cardiovascular: RRR,  Respiratory: CTAB Abdomen: obese, mildly distended, soft and non-tender Extremities: no edema  Discharge Instructions   Discharge Instructions     Diet - low sodium heart healthy   Complete by: As directed    Increase activity slowly   Complete by: As directed       Allergies as of 04/01/2021       Reactions   Perflutren Lipid Microspheres    Other reaction(s): Other (See Comments) Back pain   Latex    LATEX TAPE   Pineapple Hives        Medication List     TAKE these medications    albuterol 108 (90 Base) MCG/ACT inhaler Commonly known as: VENTOLIN HFA Inhale 2 puffs into the lungs every 6 (six) hours as needed for wheezing or shortness of breath.   blood glucose meter kit and supplies Dispense based on patient and insurance preference. Use once daily as directed. (FOR ICD-10 E11.69).   cetirizine 10 MG tablet Commonly known as: ZYRTEC Take 1 tablet (10 mg total) by  mouth daily.   empagliflozin 25 MG Tabs tablet Commonly known as: JARDIANCE Take 1 tablet (25 mg total) by mouth daily.   furosemide 20 MG tablet Commonly known as: LASIX Take 2 tablets (40 mg total) by mouth daily. What changed:  how much to take when to take this additional instructions   guaiFENesin-dextromethorphan 100-10 MG/5ML syrup Commonly known as: ROBITUSSIN DM Take 5 mLs by mouth every 4 (four) hours as  needed for cough.   loratadine 10 MG tablet Commonly known as: CLARITIN Take 1 tablet (10 mg total) by mouth daily.   LORazepam 0.5 MG tablet Commonly known as: ATIVAN Take 1 tablet (0.5 mg total) by mouth every 8 (eight) hours.   omeprazole 20 MG capsule Commonly known as: PRILOSEC Take 1 capsule (20 mg total) by mouth daily.   propranolol 20 MG tablet Commonly known as: INDERAL Take 1 tablet (20 mg total) by mouth 2 (two) times daily.   sertraline 50 MG tablet Commonly known as: ZOLOFT Take 1 tablet (50 mg total) by mouth daily.   spironolactone 100 MG tablet Commonly known as: ALDACTONE TAKE ONE-HALF TABLET BY  MOUTH daily What changed: See the new instructions.   Trulicity 9.52 WU/1.3KG Sopn Generic drug: Dulaglutide Inject 0.75 mg into the skin once a week.       Allergies  Allergen Reactions   Perflutren Lipid Microspheres     Other reaction(s): Other (See Comments) Back pain   Latex     LATEX TAPE   Pineapple Hives    Follow-up Information     Your liver doctor Follow up.   Why: within 1 week                 The results of significant diagnostics from this hospitalization (including imaging, microbiology, ancillary and laboratory) are listed below for reference.    Significant Diagnostic Studies: DG Chest 2 View  Result Date: 03/30/2021 CLINICAL DATA:  cough EXAM: CHEST - 2 VIEW COMPARISON:  02/02/2021. FINDINGS: Low lung volumes with bronchovascular crowding and central pulmonary vascular congestion. No consolidation. No visible pleural effusions or pneumothorax. Similar borderline mild cardiac enlargement. No acute osseous abnormality. IMPRESSION: Similar low lung volumes and central pulmonary vascular congestion. No consolidation to suggest pneumonia. Electronically Signed   By: Margaretha Sheffield M.D.   On: 03/30/2021 19:36   CT Head Wo Contrast  Result Date: 03/30/2021 CLINICAL DATA:  Altered mental status, confusion, hyperammonemic  EXAM: CT HEAD WITHOUT CONTRAST TECHNIQUE: Contiguous axial images were obtained from the base of the skull through the vertex without intravenous contrast. COMPARISON:  None. FINDINGS: Brain: Normal anatomic configuration. No abnormal intra or extra-axial mass lesion or fluid collection. No abnormal mass effect or midline shift. No evidence of acute intracranial hemorrhage or infarct. Ventricular size is normal. Cerebellum unremarkable. Vascular: Unremarkable Skull: Intact Sinuses/Orbits: Paranasal sinuses are clear. Orbits are unremarkable. Other: Mastoid air cells and middle ear cavities are clear. IMPRESSION: No acute intracranial abnormality. Electronically Signed   By: Fidela Salisbury M.D.   On: 03/30/2021 23:18   US Paracentesis  Result Date: 03/31/2021 INDICATION: Cirrhosis with ascites request received for diagnostic and therapeutic paracentesis. EXAM: ULTRASOUND GUIDED DIAGNOSTIC AND THERAPEUTIC PARACENTESIS MEDICATIONS: Local 1% lidocaine only COMPLICATIONS: None immediate. PROCEDURE: Informed written consent was obtained from the patient after a discussion of the risks, benefits and alternatives to treatment. A timeout was performed prior to the initiation of the procedure. Initial ultrasound scanning demonstrates a large amount of ascites within the right lower abdominal  quadrant. The right lower abdomen was prepped and draped in the usual sterile fashion. 1% lidocaine was used for local anesthesia. Following this, a 19 gauge, 7-cm, Yueh catheter was introduced. An ultrasound image was saved for documentation purposes. The paracentesis was performed. The catheter was removed and a dressing was applied. The patient tolerated the procedure well without immediate post procedural complication. FINDINGS: A total of approximately 3.2 L of clear yellow fluid was removed. Samples were sent to the laboratory as requested by the clinical team. IMPRESSION: Successful ultrasound-guided paracentesis yielding 3.2  liters of peritoneal fluid. Read By: Tsosie Billing PA-C Electronically Signed   By: Aletta Edouard M.D.   On: 03/31/2021 12:53    Microbiology: Recent Results (from the past 240 hour(s))  Resp Panel by RT-PCR (Flu A&B, Covid) Nasopharyngeal Swab     Status: Abnormal   Collection Time: 03/30/21 10:16 PM   Specimen: Nasopharyngeal Swab; Nasopharyngeal(NP) swabs in vial transport medium  Result Value Ref Range Status   SARS Coronavirus 2 by RT PCR POSITIVE (A) NEGATIVE Final    Comment: RESULT CALLED TO, READ BACK BY AND VERIFIED WITH: Tiffany Dean_0  10/11 22RH (NOTE) SARS-CoV-2 target nucleic acids are DETECTED.  The SARS-CoV-2 RNA is generally detectable in upper respiratory specimens during the acute phase of infection. Positive results are indicative of the presence of the identified virus, but do not rule out bacterial infection or co-infection with other pathogens not detected by the test. Clinical correlation with patient history and other diagnostic information is necessary to determine patient infection status. The expected result is Negative.  Fact Sheet for Patients: EntrepreneurPulse.com.au  Fact Sheet for Healthcare Providers: IncredibleEmployment.be  This test is not yet approved or cleared by the [place] FDA and  has been authorized for detection and/or diagnosis of SARS-CoV-2 by FDA under an Emergency Use Authorization (EUA).  This EUA will remain in effect (meaning this test can be used ) for the duration of  the COVID-19 declaration under Section 564(b)(1) of the Act, 21 U.S.C. section 360bbb-3(b)(1), unless the authorization is terminated or revoked sooner.     Influenza A by PCR NEGATIVE NEGATIVE Final   Influenza B by PCR NEGATIVE NEGATIVE Final    Comment: (NOTE) The Xpert Xpress SARS-CoV-2/FLU/RSV plus assay is intended as an aid in the diagnosis of influenza from Nasopharyngeal swab specimens and should not be  used as a sole basis for treatment. Nasal washings and aspirates are unacceptable for Xpert Xpress SARS-CoV-2/FLU/RSV testing.  Fact Sheet for Patients: EntrepreneurPulse.com.au  Fact Sheet for Healthcare Providers: IncredibleEmployment.be  This test is not yet approved or cleared by the [place] FDA and has been authorized for detection and/or diagnosis of SARS-CoV-2 by FDA under an Emergency Use Authorization (EUA). This EUA will remain in effect (meaning this test can be used) for the duration of the COVID-19 declaration under Section 564(b)(1) of the Act, 21 U.S.C. section 360bbb-3(b)(1), unless the authorization is terminated or revoked.  Performed at [facility], [address] [place], [place] [address]   Body fluid culture w Gram Stain     Status: None (Preliminary result)   Collection Time: 03/31/21 12:10 PM   Specimen: PATH Cytology Peritoneal fluid  Result Value Ref Range Status   Specimen Description   Final    PERITONEAL Performed at [facility], [address], [place], [place] [address]    Special Requests   Final    NONE Performed at [facility], [place],  [place] [address]    Gram Stain   Final    FEW WBC PRESENT, PREDOMINANTLY MONONUCLEAR NO ORGANISMS SEEN Performed at [facility] [address] [place], [place] [address]    Culture PENDING  Incomplete   Report Status PENDING  Incomplete     Labs: Basic Metabolic Panel: Recent Labs  Lab 03/30/21 1907 04/01/21 0719  NA 133* 133*  K 4.6 4.2  CL 101 103  CO2 23 24  GLUCOSE 137* 118*  BUN 31* 37*  CREATININE 2.03* 2.25*  CALCIUM 8.2* 8.1*   Liver Function Tests: Recent Labs  Lab 03/30/21 1907 04/01/21 0719  AST 54* 51*  ALT 27 23  ALKPHOS 63 49  BILITOT 3.7* 1.8*  PROT 6.8 5.9*  ALBUMIN 2.7* 2.3*   No results for input(s): LIPASE, AMYLASE in the last 168 hours. Recent Labs  Lab  03/30/21 1907  AMMONIA 47*   CBC: Recent Labs  Lab 03/30/21 1907  WBC 4.9  HGB 8.1*  HCT 22.9*  MCV 100.0  PLT 35*   Cardiac Enzymes: No results for input(s): CKTOTAL, CKMB, CKMBINDEX, TROPONINI in the last 168 hours. BNP: BNP (last 3 results) No results for input(s): BNP in the last 8760 hours.  ProBNP (last 3 results) No results for input(s): PROBNP in the last 8760 hours.  CBG: No results for input(s): GLUCAP in the last 168 hours.     Signed:  Desma Maxim MD.  [facility] 04/01/2021, 8:16 AM

## 2021-04-01 NOTE — Progress Notes (Signed)
Patient slept fairly well. No c/o pain. Given prn robitussin x1 for cough-effective. Pt assisted to bathroom with one assist. Unable to obtain urine specimen due to inability to use urinal. Specimen hat brought to room for next void.

## 2021-04-03 DIAGNOSIS — K7460 Unspecified cirrhosis of liver: Secondary | ICD-10-CM | POA: Diagnosis not present

## 2021-04-03 DIAGNOSIS — K7581 Nonalcoholic steatohepatitis (NASH): Secondary | ICD-10-CM | POA: Diagnosis not present

## 2021-04-04 ENCOUNTER — Other Ambulatory Visit: Payer: Self-pay | Admitting: Family Medicine

## 2021-04-04 DIAGNOSIS — M7989 Other specified soft tissue disorders: Secondary | ICD-10-CM

## 2021-04-04 LAB — BODY FLUID CULTURE W GRAM STAIN: Culture: NO GROWTH

## 2021-04-05 NOTE — Telephone Encounter (Signed)
med ordered 04/01/21 by Dr Si Raider, Ailene Rud

## 2021-04-06 DIAGNOSIS — K2990 Gastroduodenitis, unspecified, without bleeding: Secondary | ICD-10-CM | POA: Diagnosis not present

## 2021-04-06 DIAGNOSIS — K7460 Unspecified cirrhosis of liver: Secondary | ICD-10-CM | POA: Diagnosis not present

## 2021-04-06 DIAGNOSIS — I864 Gastric varices: Secondary | ICD-10-CM | POA: Diagnosis not present

## 2021-04-06 DIAGNOSIS — K766 Portal hypertension: Secondary | ICD-10-CM | POA: Diagnosis not present

## 2021-04-06 DIAGNOSIS — I8500 Esophageal varices without bleeding: Secondary | ICD-10-CM | POA: Diagnosis not present

## 2021-04-06 DIAGNOSIS — K7581 Nonalcoholic steatohepatitis (NASH): Secondary | ICD-10-CM | POA: Diagnosis not present

## 2021-04-13 DIAGNOSIS — K7581 Nonalcoholic steatohepatitis (NASH): Secondary | ICD-10-CM | POA: Diagnosis not present

## 2021-04-13 DIAGNOSIS — K7460 Unspecified cirrhosis of liver: Secondary | ICD-10-CM | POA: Diagnosis not present

## 2021-04-14 LAB — MISC LABCORP TEST (SEND OUT): Labcorp test code: 9985

## 2021-04-15 ENCOUNTER — Ambulatory Visit: Payer: BC Managed Care – PPO

## 2021-04-15 ENCOUNTER — Other Ambulatory Visit: Payer: Self-pay

## 2021-04-15 DIAGNOSIS — G4733 Obstructive sleep apnea (adult) (pediatric): Secondary | ICD-10-CM | POA: Diagnosis not present

## 2021-04-15 DIAGNOSIS — R0683 Snoring: Secondary | ICD-10-CM

## 2021-04-17 ENCOUNTER — Telehealth: Payer: Self-pay | Admitting: Pulmonary Disease

## 2021-04-17 DIAGNOSIS — G4733 Obstructive sleep apnea (adult) (pediatric): Secondary | ICD-10-CM | POA: Diagnosis not present

## 2021-04-17 NOTE — Telephone Encounter (Signed)
Chesley Mires, MD    AM Note HST 04/15/21 >> AHI 10.4, SpO2 low 74%     Please inform him that his sleep study shows mild obstructive sleep apnea.  Please arrange for ROV with me or NP to discuss treatment options.      Patient is aware of results and voiced his understanding. Appt scheduled 05/06/2021 at 10:00. Nothing further needed at this time.

## 2021-04-17 NOTE — Telephone Encounter (Signed)
HST 04/15/21 >> AHI 10.4, SpO2 low 74%   Please inform him that his sleep study shows mild obstructive sleep apnea.  Please arrange for ROV with me or NP to discuss treatment options.

## 2021-04-17 NOTE — Telephone Encounter (Signed)
ATC. LMTCB RDS office number

## 2021-04-20 ENCOUNTER — Telehealth: Payer: Self-pay

## 2021-04-20 ENCOUNTER — Ambulatory Visit: Payer: BC Managed Care – PPO | Admitting: Family Medicine

## 2021-04-20 NOTE — Telephone Encounter (Signed)
Copied from [place] [phone]. Topic: Appointment Scheduling - Scheduling Inquiry for Clinic >> Apr 20, 2021  8:10 AM Loma [place] wrote: Reason for CRM: Pt had to cancel this appt this am due to sickness. Sch not available for [place]/ pt aware and wants to FU with one of new PCP. Resch  2 month FU, Contact Velva Harman, wife [phone] or leave a message with resched date

## 2021-04-20 NOTE — Progress Notes (Deleted)
      Established patient visit   Patient: Jesse Zimmerman   DOB: 01/30/1961   60 y.o. Male  MRN: [id number] Visit Date: 04/20/2021  Today's healthcare provider: Wilhemena Durie, MD   No chief complaint on file.  Subjective    HPI     {Link to patient history deactivated due to formatting error:1}  Medications: Outpatient Medications Prior to Visit  Medication Sig   albuterol (VENTOLIN HFA) 108 (90 Base) MCG/ACT inhaler Inhale 2 puffs into the lungs every 6 (six) hours as needed for wheezing or shortness of breath.   blood glucose meter kit and supplies Dispense based on patient and insurance preference. Use once daily as directed. (FOR ICD-10 E11.69).   cetirizine (ZYRTEC) 10 MG tablet Take 1 tablet (10 mg total) by mouth daily. (Patient not taking: No sig reported)   Dulaglutide (TRULICITY) 4.14 EL/9.5VU SOPN Inject 0.75 mg into the skin once a week.   empagliflozin (JARDIANCE) 25 MG TABS tablet Take 1 tablet (25 mg total) by mouth daily.   furosemide (LASIX) 20 MG tablet Take 2 tablets (40 mg total) by mouth daily.   guaiFENesin-dextromethorphan (ROBITUSSIN DM) 100-10 MG/5ML syrup Take 5 mLs by mouth every 4 (four) hours as needed for cough.   loratadine (CLARITIN) 10 MG tablet Take 1 tablet (10 mg total) by mouth daily.   LORazepam (ATIVAN) 0.5 MG tablet Take 1 tablet (0.5 mg total) by mouth every 8 (eight) hours.   omeprazole (PRILOSEC) 20 MG capsule Take 1 capsule (20 mg total) by mouth daily.   propranolol (INDERAL) 20 MG tablet Take 1 tablet (20 mg total) by mouth 2 (two) times daily.   sertraline (ZOLOFT) 50 MG tablet Take 1 tablet (50 mg total) by mouth daily.   spironolactone (ALDACTONE) 100 MG tablet TAKE ONE-HALF TABLET BY  MOUTH daily   No facility-administered medications prior to visit.    Review of Systems  {Labs  Heme  Chem  Endocrine  Serology  Results Review (optional):23779}   Objective    There were no vitals taken for this visit. {Show  previous vital signs (optional):23777}  Physical Exam  ***  No results found for any visits on 04/20/21.  Assessment & Plan     ***  No follow-ups on file.      {provider attestation***:1}   Wilhemena Durie, MD  [facility] [phone] (phone) [phone] (fax)  [place]

## 2021-04-21 ENCOUNTER — Telehealth: Payer: Self-pay

## 2021-04-21 NOTE — Telephone Encounter (Signed)
Copied from [place] [id number]. Topic: General - Other >> Apr 21, 2021  3:16 PM Tessa Lerner A wrote: Reason for CRM: The patient has been directed to call to request orders for a blood transfusion   The patient has concerns with their recent dehydration   Dr. Enis Gash can be reached at [phone] to provide further elaboration   Please contact to further advise when possible

## 2021-04-22 ENCOUNTER — Ambulatory Visit (INDEPENDENT_AMBULATORY_CARE_PROVIDER_SITE_OTHER): Payer: BC Managed Care – PPO | Admitting: Family Medicine

## 2021-04-22 ENCOUNTER — Encounter: Payer: Self-pay | Admitting: Family Medicine

## 2021-04-22 ENCOUNTER — Other Ambulatory Visit: Payer: Self-pay

## 2021-04-22 ENCOUNTER — Telehealth: Payer: Self-pay

## 2021-04-22 VITALS — BP 115/54 | HR 53 | Temp 98.0°F | Wt 328.0 lb

## 2021-04-22 DIAGNOSIS — Z23 Encounter for immunization: Secondary | ICD-10-CM | POA: Diagnosis not present

## 2021-04-22 DIAGNOSIS — E0580 Other thyrotoxicosis without thyrotoxic crisis or storm: Secondary | ICD-10-CM

## 2021-04-22 DIAGNOSIS — K279 Peptic ulcer, site unspecified, unspecified as acute or chronic, without hemorrhage or perforation: Secondary | ICD-10-CM | POA: Diagnosis not present

## 2021-04-22 DIAGNOSIS — Z6841 Body Mass Index (BMI) 40.0 and over, adult: Secondary | ICD-10-CM

## 2021-04-22 DIAGNOSIS — D696 Thrombocytopenia, unspecified: Secondary | ICD-10-CM

## 2021-04-22 DIAGNOSIS — E669 Obesity, unspecified: Secondary | ICD-10-CM

## 2021-04-22 DIAGNOSIS — E1169 Type 2 diabetes mellitus with other specified complication: Secondary | ICD-10-CM | POA: Diagnosis not present

## 2021-04-22 DIAGNOSIS — D649 Anemia, unspecified: Secondary | ICD-10-CM | POA: Diagnosis not present

## 2021-04-22 DIAGNOSIS — R5383 Other fatigue: Secondary | ICD-10-CM

## 2021-04-22 DIAGNOSIS — E063 Autoimmune thyroiditis: Secondary | ICD-10-CM

## 2021-04-22 DIAGNOSIS — K7581 Nonalcoholic steatohepatitis (NASH): Secondary | ICD-10-CM

## 2021-04-22 DIAGNOSIS — K7460 Unspecified cirrhosis of liver: Secondary | ICD-10-CM

## 2021-04-22 DIAGNOSIS — R188 Other ascites: Secondary | ICD-10-CM

## 2021-04-22 MED ORDER — FREESTYLE LIBRE 2 SENSOR MISC: 1.0000 | Freq: Every day | 12 refills | Status: DC

## 2021-04-22 MED ORDER — FREESTYLE LIBRE 2 READER DEVI: 1.0000 | Freq: Every day | 0 refills | Status: DC

## 2021-04-22 NOTE — Telephone Encounter (Signed)
Arrangements being made for patient to come in today

## 2021-04-22 NOTE — Telephone Encounter (Signed)
Copied from [place] [id number]. Topic: Appointment Scheduling - Scheduling Inquiry for Clinic >> Apr 21, 2021  5:01 PM Tessa Lerner A wrote: Reason for CRM: The patient has been directed to be seen by their PCP or an available member of staff to address dehydration concerns  The patient was told by staff at [facility] that they need to be seen by their PCP as well as to request an order for a blood transfusion   Please contact further when possible

## 2021-04-22 NOTE — Progress Notes (Signed)
Acute Office Visit  Subjective:    Patient ID: Jesse Zimmerman, male    DOB: 26-Apr-1961, 60 y.o.   MRN: [id number]  No chief complaint on file.   HPI Patient is in today for patient is a 60 year old male who presents today for evaluation and treatment of dehydration and anemia.  He has been in touch with his doctors at [facility] and they have told him that they want for [place] to make arrangements for patient to have transfusion and possible fluids.   Patient states that he was sick about 3 weeks ago,  He was running fever and had mental status changes.  He went to ER and was found to be Covid positive.  He was admitted and treated with Remdesivir.  He states that on day 2 of his admission he had extremely abnormal kidney function.  He states that since that time he feels he has gotten progressively worse.  Patient states he has had fever on Saturday but none since.  Patient states he has started taking Prilosec to see if it would help his cough any.  Patient has requested a handicap placard application as he is having great difficulty being able to walk any distance.  Patient states he is had 3 falls since his last visit that he thinks are contributed to by his anemia as he is very weak. His meld score through GI has improved from 25-19 but he remains on the transplant list. CBC Latest Ref Rng & Units 04/01/2021 03/30/2021 02/16/2021  WBC 4.0 - 10.5 K/uL 2.1(L) 4.9 4.6  Hemoglobin 13.0 - 17.0 g/dL 6.9(L) 8.1(L) 8.2(L)  Hematocrit 39.0 - 52.0 % 19.3(L) 22.9(L) 23.3(L)  Platelets 150 - 400 K/uL 29(LL) 35(L) 38(LL)    Lab Results  Component Value Date   NA 133 (L) 04/01/2021   K 4.2 04/01/2021   CO2 24 04/01/2021   GLUCOSE 118 (H) 04/01/2021   BUN 37 (H) 04/01/2021   CREATININE 2.25 (H) 04/01/2021   CALCIUM 8.1 (L) 04/01/2021   EGFR 51 (L) 02/16/2021   GFRNONAA 33 (L) 04/01/2021     Past Medical History:  Diagnosis Date   Chronic back pain    Cirrhosis ([place])    NASH   Diabetes  mellitus without complication ([place])    Esophageal varices (HCC)    Hyperlipidemia    NASH (nonalcoholic steatohepatitis)     Past Surgical History:  Procedure Laterality Date   COLONOSCOPY WITH PROPOFOL N/A 08/12/2017   Procedure: COLONOSCOPY WITH PROPOFOL;  Surgeon: Jonathon Bellows, MD;  Location: [facility] ENDOSCOPY;  Service: Gastroenterology;  Laterality: N/A;   ESOPHAGOGASTRODUODENOSCOPY (EGD) WITH PROPOFOL N/A 04/18/2017   Procedure: ESOPHAGOGASTRODUODENOSCOPY (EGD) WITH PROPOFOL;  Surgeon: [name], MD;  Location: [facility] ENDOSCOPY;  Service: Endoscopy;  Laterality: N/A;   ESOPHAGOGASTRODUODENOSCOPY (EGD) WITH PROPOFOL N/A 07/18/2018   Procedure: ESOPHAGOGASTRODUODENOSCOPY (EGD) WITH PROPOFOL;  Surgeon: [name], MD;  Location: [facility] ENDOSCOPY;  Service: Endoscopy;  Laterality: N/A;   EXCESSIVE THIGH / HIP / BUTTOCK / FLANK SKIN EXCISION     PART OF INNER THIGH/DUE TO SEPSIS INFECTION REMOVED   KIDNEY STONE SURGERY     removed   TONSILLECTOMY AND ADENOIDECTOMY      Family History  Problem Relation Age of Onset   Hypertension Mother    Breast cancer Mother    Dementia Mother    Heart attack Father    Gout Father    Hypertension Father    Hypertension Sister    Hypertension Sister  Colon cancer Maternal Aunt    Colon cancer Maternal Grandmother    Colon cancer Paternal Grandmother     Social History   Socioeconomic History   Marital status: Married    Spouse name: Velva Harman   Number of children: 0   Years of education: college   Highest education level: Not on file  Occupational History   Occupation: work at [employer]  Tobacco Use   Smoking status: Never   Smokeless tobacco: Never  Substance and Sexual Activity   Alcohol use: No   Drug use: No   Sexual activity: Not Currently  Other Topics Concern   Not on file  Social History Narrative   Not on file   Social Determinants of Health   Financial Resource Strain: Not on file  Food Insecurity: Not on file   Transportation Needs: Not on file  Physical Activity: Not on file  Stress: Not on file  Social Connections: Not on file  Intimate Partner Violence: Not on file    Outpatient Medications Prior to Visit  Medication Sig Dispense Refill   albuterol (VENTOLIN HFA) 108 (90 Base) MCG/ACT inhaler Inhale 2 puffs into the lungs every 6 (six) hours as needed for wheezing or shortness of breath. 18 g 1   blood glucose meter kit and supplies Dispense based on patient and insurance preference. Use once daily as directed. (FOR ICD-10 E11.69). 1 each 0   cetirizine (ZYRTEC) 10 MG tablet Take 1 tablet (10 mg total) by mouth daily. (Patient not taking: No sig reported) 30 tablet 11   Dulaglutide (TRULICITY) 3.09 MM/7.6KG SOPN Inject 0.75 mg into the skin once a week.     empagliflozin (JARDIANCE) 25 MG TABS tablet Take 1 tablet (25 mg total) by mouth daily. 90 tablet 1   furosemide (LASIX) 20 MG tablet Take 2 tablets (40 mg total) by mouth daily. 30 tablet 2   guaiFENesin-dextromethorphan (ROBITUSSIN DM) 100-10 MG/5ML syrup Take 5 mLs by mouth every 4 (four) hours as needed for cough. 118 mL 0   loratadine (CLARITIN) 10 MG tablet Take 1 tablet (10 mg total) by mouth daily. 30 tablet 11   LORazepam (ATIVAN) 0.5 MG tablet Take 1 tablet (0.5 mg total) by mouth every 8 (eight) hours. 30 tablet 0   omeprazole (PRILOSEC) 20 MG capsule Take 1 capsule (20 mg total) by mouth daily. 90 capsule 1   propranolol (INDERAL) 20 MG tablet Take 1 tablet (20 mg total) by mouth 2 (two) times daily. 60 tablet 2   sertraline (ZOLOFT) 50 MG tablet Take 1 tablet (50 mg total) by mouth daily. 30 tablet 11   spironolactone (ALDACTONE) 100 MG tablet TAKE ONE-HALF TABLET BY  MOUTH daily 180 tablet 0   No facility-administered medications prior to visit.    Allergies  Allergen Reactions   Perflutren Lipid Microspheres     Other reaction(s): Other (See Comments) Back pain   Latex     LATEX TAPE   Pineapple Hives    Review of  Systems  Constitutional:  Positive for chills, diaphoresis, fatigue and fever. Negative for appetite change.  HENT:  Positive for ear pain, sinus pressure, sinus pain, sneezing and tinnitus. Negative for congestion, ear discharge, nosebleeds, postnasal drip, rhinorrhea, sore throat and trouble swallowing.   Eyes:  Positive for redness. Negative for photophobia, pain, discharge, itching and visual disturbance.  Respiratory:  Positive for cough, shortness of breath and wheezing.   Cardiovascular:  Positive for leg swelling. Negative for chest pain and palpitations.  Gastrointestinal:  Positive for abdominal pain, constipation, diarrhea and nausea. Negative for blood in stool, rectal pain and vomiting.  Musculoskeletal:  Positive for myalgias.  Neurological:  Positive for dizziness, weakness and headaches.  Psychiatric/Behavioral:  Positive for confusion, decreased concentration and sleep disturbance.       Objective:    Physical Exam Vitals reviewed.  Constitutional:      Appearance: Normal appearance. He is obese.  HENT:     Head: Normocephalic and atraumatic.     Right Ear: External ear normal.     Left Ear: External ear normal.     Nose: Nose normal.     Mouth/Throat:     Pharynx: Oropharynx is clear.  Eyes:     General: No scleral icterus.    Conjunctiva/sclera: Conjunctivae normal.  Cardiovascular:     Rate and Rhythm: Normal rate and regular rhythm.     Pulses: Normal pulses.     Heart sounds: Normal heart sounds.  Pulmonary:     Effort: Pulmonary effort is normal.  Abdominal:     General: There is no distension.     Palpations: Abdomen is soft.     Tenderness: There is no abdominal tenderness.     Comments: Patient appears to have a little abdominal distention.  Musculoskeletal:     Comments: 1+  edema.  Skin:    General: Skin is warm and dry.  Neurological:     General: No focal deficit present.     Mental Status: He is alert and oriented to person, place, and time.  Mental status is at baseline.  Psychiatric:        Mood and Affect: Mood normal.        Behavior: Behavior normal.        Thought Content: Thought content normal.        Judgment: Judgment normal.    BP (!) 115/54 (BP Location: Right Arm, Patient Position: Sitting, Cuff Size: Large)   Pulse (!) 53   Temp 98 F (36.7 C) (Oral)   Wt (!) 328 lb (148.8 kg)   SpO2 100%   BMI 44.48 kg/m  Wt Readings from Last 3 Encounters:  04/22/21 (!) 328 lb (148.8 kg)  03/30/21 (!) 320 lb (145.2 kg)  03/12/21 (!) 317 lb (143.8 kg)    Health Maintenance Due  Topic Date Due   Zoster Vaccines- Shingrix (1 of 2) Never done   Pneumococcal Vaccine 38-39 Years old (2 - PCV) 02/03/2018   FOOT EXAM  02/03/2018   URINE MICROALBUMIN  02/03/2018   COVID-19 Vaccine (4 - Booster for Pfizer series) 07/23/2020   OPHTHALMOLOGY EXAM  03/25/2021    There are no preventive care reminders to display for this patient.   Lab Results  Component Value Date   TSH 2.230 10/27/2020   Lab Results  Component Value Date   WBC 2.1 (L) 04/01/2021   HGB 6.9 (L) 04/01/2021   HCT 19.3 (L) 04/01/2021   MCV 98.0 04/01/2021   PLT 29 (LL) 04/01/2021   Lab Results  Component Value Date   NA 133 (L) 04/01/2021   K 4.2 04/01/2021   CO2 24 04/01/2021   GLUCOSE 118 (H) 04/01/2021   BUN 37 (H) 04/01/2021   CREATININE 2.25 (H) 04/01/2021   BILITOT 1.8 (H) 04/01/2021   ALKPHOS 49 04/01/2021   AST 51 (H) 04/01/2021   ALT 23 04/01/2021   PROT 5.9 (L) 04/01/2021   ALBUMIN 2.3 (L) 04/01/2021   CALCIUM 8.1 (L) 04/01/2021  ANIONGAP 6 04/01/2021   EGFR 51 (L) 02/16/2021   Lab Results  Component Value Date   CHOL 89 (L) 10/27/2020   Lab Results  Component Value Date   HDL 38 (L) 10/27/2020   Lab Results  Component Value Date   LDLCALC 37 10/27/2020   Lab Results  Component Value Date   TRIG 63 10/27/2020   Lab Results  Component Value Date   CHOLHDL 2.3 10/27/2020   Lab Results  Component Value Date    HGBA1C 5.0 02/16/2021       Assessment & Plan:   Problem List Items Addressed This Visit   None   1. Anemia, unspecified type Will attempt to transfuse the patient as an outpatient with PRBCs for anemia and thrombocytopenia.  I think would help with quality of life but he is in no acute distress and is are relatively stable some not sure if we can get this done as an outpatient short of an admission or observation stay  2. Diabetes mellitus type 2 in obese [facility]) He has evidently stopped his Jardiance.  3. Cirrhosis of liver with ascites, unspecified hepatic cirrhosis type (HCC) Meld score 19 and [facility] - 0.9 %  sodium chloride infusion  4. PUD (peptic ulcer disease) On omeprazole.  5. Fatigue, unspecified type Is hemoglobin was 8.0 - 0.9 %  sodium chloride infusion  6. Thrombocytopenia (HCC) Last platelet counts in the 40s - 0.9 %  sodium chloride infusion  7. Class 3 severe obesity due to excess calories with serious comorbidity and body mass index (BMI) of 40.0 to 44.9 in adult ([place])    8. Liver cirrhosis secondary to NASH (nonalcoholic steatohepatitis) (HCC)     I, Wilhemena Durie, MD, have reviewed all documentation for this visit. The documentation on 04/26/21 for the exam, diagnosis, procedures, and orders are all accurate and complete.  Juluis Mire, CMA

## 2021-04-23 ENCOUNTER — Telehealth: Payer: Self-pay

## 2021-04-23 MED ORDER — ONDANSETRON 8 MG PO TBDP: 8.0000 mg | ORAL_TABLET | Freq: Three times a day (TID) | ORAL | 3 refills | Status: DC | PRN

## 2021-04-23 MED ORDER — SODIUM CHLORIDE 0.9 % IV SOLN: 10.0000 mL/h | Freq: Once | INTRAVENOUS | Status: DC

## 2021-04-23 NOTE — Telephone Encounter (Signed)
Copied from [place] [id number]. Topic: General - Other >> Apr 23, 2021  1:52 PM Tessa Lerner A wrote: Reason for CRM: The patient would like to be contacted about an appt to be seen by Dr Rosanna Randy today   The patient would also like to have a prescription for their previously prescribed nausea medicine to be resubmitted to their pharmacy as well   The patient was uncertain of the name of the medication at the time of call   Please contact further

## 2021-04-23 NOTE — Telephone Encounter (Signed)
Please review.  It looks like pt was just seen here yesterday.  (04/22/2021)   Thanks,   -Mickel Baas

## 2021-04-29 ENCOUNTER — Ambulatory Visit: Payer: BC Managed Care – PPO | Admitting: Family Medicine

## 2021-04-29 DIAGNOSIS — K766 Portal hypertension: Secondary | ICD-10-CM | POA: Diagnosis not present

## 2021-04-29 DIAGNOSIS — E1122 Type 2 diabetes mellitus with diabetic chronic kidney disease: Secondary | ICD-10-CM | POA: Diagnosis not present

## 2021-04-29 DIAGNOSIS — Z01810 Encounter for preprocedural cardiovascular examination: Secondary | ICD-10-CM | POA: Diagnosis not present

## 2021-04-29 DIAGNOSIS — D61818 Other pancytopenia: Secondary | ICD-10-CM | POA: Diagnosis not present

## 2021-04-29 DIAGNOSIS — I129 Hypertensive chronic kidney disease with stage 1 through stage 4 chronic kidney disease, or unspecified chronic kidney disease: Secondary | ICD-10-CM | POA: Diagnosis not present

## 2021-04-29 DIAGNOSIS — D539 Nutritional anemia, unspecified: Secondary | ICD-10-CM | POA: Diagnosis not present

## 2021-04-29 DIAGNOSIS — G4733 Obstructive sleep apnea (adult) (pediatric): Secondary | ICD-10-CM | POA: Diagnosis not present

## 2021-04-29 DIAGNOSIS — M47814 Spondylosis without myelopathy or radiculopathy, thoracic region: Secondary | ICD-10-CM | POA: Diagnosis not present

## 2021-04-29 DIAGNOSIS — E785 Hyperlipidemia, unspecified: Secondary | ICD-10-CM | POA: Diagnosis not present

## 2021-04-29 DIAGNOSIS — E11319 Type 2 diabetes mellitus with unspecified diabetic retinopathy without macular edema: Secondary | ICD-10-CM | POA: Diagnosis not present

## 2021-04-29 DIAGNOSIS — K7581 Nonalcoholic steatohepatitis (NASH): Secondary | ICD-10-CM | POA: Diagnosis not present

## 2021-04-29 DIAGNOSIS — D684 Acquired coagulation factor deficiency: Secondary | ICD-10-CM | POA: Diagnosis not present

## 2021-04-29 DIAGNOSIS — Z7682 Awaiting organ transplant status: Secondary | ICD-10-CM | POA: Diagnosis not present

## 2021-04-29 DIAGNOSIS — K7460 Unspecified cirrhosis of liver: Secondary | ICD-10-CM | POA: Diagnosis not present

## 2021-04-29 DIAGNOSIS — R188 Other ascites: Secondary | ICD-10-CM | POA: Diagnosis not present

## 2021-04-29 DIAGNOSIS — D500 Iron deficiency anemia secondary to blood loss (chronic): Secondary | ICD-10-CM | POA: Diagnosis not present

## 2021-04-29 DIAGNOSIS — N200 Calculus of kidney: Secondary | ICD-10-CM | POA: Diagnosis not present

## 2021-04-29 DIAGNOSIS — D689 Coagulation defect, unspecified: Secondary | ICD-10-CM | POA: Diagnosis not present

## 2021-04-29 DIAGNOSIS — N179 Acute kidney failure, unspecified: Secondary | ICD-10-CM | POA: Diagnosis not present

## 2021-04-29 DIAGNOSIS — E559 Vitamin D deficiency, unspecified: Secondary | ICD-10-CM | POA: Diagnosis not present

## 2021-04-29 DIAGNOSIS — R9439 Abnormal result of other cardiovascular function study: Secondary | ICD-10-CM | POA: Diagnosis not present

## 2021-04-29 DIAGNOSIS — D509 Iron deficiency anemia, unspecified: Secondary | ICD-10-CM | POA: Diagnosis not present

## 2021-04-29 DIAGNOSIS — R42 Dizziness and giddiness: Secondary | ICD-10-CM | POA: Diagnosis not present

## 2021-04-29 DIAGNOSIS — D696 Thrombocytopenia, unspecified: Secondary | ICD-10-CM | POA: Diagnosis not present

## 2021-04-29 DIAGNOSIS — E119 Type 2 diabetes mellitus without complications: Secondary | ICD-10-CM | POA: Diagnosis not present

## 2021-04-29 DIAGNOSIS — R7989 Other specified abnormal findings of blood chemistry: Secondary | ICD-10-CM | POA: Diagnosis not present

## 2021-04-29 DIAGNOSIS — N1831 Chronic kidney disease, stage 3a: Secondary | ICD-10-CM | POA: Diagnosis not present

## 2021-04-29 DIAGNOSIS — N183 Chronic kidney disease, stage 3 unspecified: Secondary | ICD-10-CM | POA: Diagnosis not present

## 2021-04-29 DIAGNOSIS — R918 Other nonspecific abnormal finding of lung field: Secondary | ICD-10-CM | POA: Diagnosis not present

## 2021-04-29 DIAGNOSIS — E1142 Type 2 diabetes mellitus with diabetic polyneuropathy: Secondary | ICD-10-CM | POA: Diagnosis not present

## 2021-04-29 DIAGNOSIS — D649 Anemia, unspecified: Secondary | ICD-10-CM | POA: Diagnosis not present

## 2021-04-29 DIAGNOSIS — D6959 Other secondary thrombocytopenia: Secondary | ICD-10-CM | POA: Diagnosis not present

## 2021-05-06 ENCOUNTER — Ambulatory Visit: Payer: BC Managed Care – PPO | Admitting: Primary Care

## 2021-05-07 ENCOUNTER — Telehealth: Payer: Self-pay | Admitting: Family Medicine

## 2021-05-07 NOTE — Telephone Encounter (Signed)
FYI

## 2021-05-07 NOTE — Telephone Encounter (Signed)
Wife wants Dr Rosanna Randy to know pt has been in [facility] for a week and is going home today.

## 2021-05-11 NOTE — Progress Notes (Signed)
I,April Miller,acting as a scribe for Wilhemena Durie, MD.,have documented all relevant documentation on the behalf of Wilhemena Durie, MD,as directed by  Wilhemena Durie, MD while in the presence of Wilhemena Durie, MD.   Established patient visit   Patient: Jesse Zimmerman   DOB: 1961/06/06   60 y.o. Male  MRN: [id number] Visit Date: 05/12/2021  Today's healthcare provider: Wilhemena Durie, MD   Chief Complaint  Patient presents with   Hospitalization Follow-up    Subjective    HPI  On follow-up at [place] evidently patient had a hemoglobin of 6.5 and was therefore transfused 4 units of packed cells.  Evaluation shows that his meld score is gone from a 12 to a 19 to a 27 and he is now on the transplant list. Feeling some better since his transfusion of 4 units and he had paracentesis x2 which resulted in a 15 pound weight loss of fluid. On lactulose up to 4 times a day for ammonia being elevated and encephalopathic hospital visit.  Follow up Hospitalization  [facility] on 05/07/2016. He was treated for Cirrhosis,Anemia, Renal Failure. Treatment for this included; see notes in chart. Telephone follow up was done on none He reports good compliance with treatment. He reports this condition is improved. Patient was advised to start Vitamin D3 1000 unit tablet. RifaXIMin 550 mg tablet Recommended to Repeat CBC and CMP  ----------------------------------------------------------------------------------------- -  Patient states he does not much improved since hospital discharge.       Medications: Outpatient Medications Prior to Visit  Medication Sig   blood glucose meter kit and supplies Dispense based on patient and insurance preference. Use once daily as directed. (FOR ICD-10 E11.69).   cetirizine (ZYRTEC) 10 MG tablet Take 1 tablet (10 mg total) by mouth daily.   Continuous Blood Gluc Receiver (FREESTYLE  LIBRE 2 READER) DEVI 1 each by Does not apply route daily.   Continuous Blood Gluc Sensor (FREESTYLE LIBRE 2 SENSOR) MISC 1 each by Does not apply route daily.   Dulaglutide (TRULICITY) 1.22 QM/2.5OI SOPN Inject 0.75 mg into the skin once a week.   empagliflozin (JARDIANCE) 25 MG TABS tablet Take 1 tablet (25 mg total) by mouth daily.   furosemide (LASIX) 20 MG tablet Take 2 tablets (40 mg total) by mouth daily.   loratadine (CLARITIN) 10 MG tablet Take 1 tablet (10 mg total) by mouth daily.   omeprazole (PRILOSEC) 20 MG capsule Take 1 capsule (20 mg total) by mouth daily.   ondansetron (ZOFRAN-ODT) 8 MG disintegrating tablet Take 1 tablet (8 mg total) by mouth every 8 (eight) hours as needed for nausea or vomiting.   propranolol (INDERAL) 20 MG tablet Take 1 tablet (20 mg total) by mouth 2 (two) times daily.   spironolactone (ALDACTONE) 100 MG tablet TAKE ONE-HALF TABLET BY  MOUTH daily   [DISCONTINUED] albuterol (VENTOLIN HFA) 108 (90 Base) MCG/ACT inhaler Inhale 2 puffs into the lungs every 6 (six) hours as needed for wheezing or shortness of breath.   LORazepam (ATIVAN) 0.5 MG tablet Take 1 tablet (0.5 mg total) by mouth every 8 (eight) hours. (Patient not taking: Reported on 05/12/2021)   sertraline (ZOLOFT) 50 MG tablet Take 1 tablet (50 mg total) by mouth daily. (Patient not taking: Reported on 05/12/2021)   [DISCONTINUED] guaiFENesin-dextromethorphan (ROBITUSSIN DM) 100-10 MG/5ML syrup Take 5 mLs by mouth every 4 (four) hours as needed for cough. (Patient not  taking: Reported on 05/12/2021)   Facility-Administered Medications Prior to Visit  Medication Dose Route Frequency Provider   0.9 %  sodium chloride infusion  10 mL/hr Intravenous Once Jerrol Banana., MD    Review of Systems  Constitutional:  Negative for appetite change, chills and fever.  Respiratory:  Negative for chest tightness, shortness of breath and wheezing.   Cardiovascular:  Negative for chest pain and  palpitations.  Gastrointestinal:  Negative for abdominal pain, nausea and vomiting.      Objective    BP 129/76 (BP Location: Left Arm, Patient Position: Sitting, Cuff Size: Large)   Pulse (!) 58   Temp 98.4 F (36.9 C) (Temporal)   Resp 18   Ht 6' (1.829 m)   Wt (!) 319 lb (144.7 kg)   SpO2 97%   BMI 43.26 kg/m  {Show previous vital signs (optional):23777}  Physical Exam Vitals reviewed.  Constitutional:      Appearance: Normal appearance. He is obese.  HENT:     Head: Normocephalic and atraumatic.     Right Ear: External ear normal.     Left Ear: External ear normal.     Nose: Nose normal.     Mouth/Throat:     Pharynx: Oropharynx is clear.  Eyes:     General: No scleral icterus.    Conjunctiva/sclera: Conjunctivae normal.  Neck:     Comments: No JVD noted. Cardiovascular:     Rate and Rhythm: Normal rate and regular rhythm.     Pulses: Normal pulses.     Heart sounds: Normal heart sounds.  Pulmonary:     Effort: Pulmonary effort is normal.  Abdominal:     General: There is no distension.     Palpations: Abdomen is soft.     Tenderness: There is no abdominal tenderness.  Musculoskeletal:     Comments: 1+  edema.  Skin:    General: Skin is warm and dry.  Neurological:     General: No focal deficit present.     Mental Status: He is alert and oriented to person, place, and time. Mental status is at baseline.  Psychiatric:        Mood and Affect: Mood normal.        Behavior: Behavior normal.        Thought Content: Thought content normal.        Judgment: Judgment normal.      No results found for any visits on 05/12/21.  Assessment & Plan     1. Decompensated hepatic cirrhosis ([place]) On transplant list at [facility].  2. Cirrhosis of liver with ascites, unspecified hepatic cirrhosis type ([place]) Due to NASH  3. Anemia, unspecified type Recently transfused 4 units.  CBC next visit he has multiple appointments at [facility] for [facility] transplant list  4. Chronic  kidney disease, unspecified CKD stage Sinew Jardiance to protect the kidneys 5. Cough  - albuterol (VENTOLIN HFA) 108 (90 Base) MCG/ACT inhaler; Inhale 2 puffs into the lungs every 6 (six) hours as needed for wheezing or shortness of breath.  Dispense: 18 g; Refill: 1  6. Diabetes mellitus type 2 in obese [facility]) After discussion with patient we will continue Jardiance for renal protection but last A1c was 4.6 so at this time we will stop Trulicity as he has no appetite.  7. Portal hypertension (HCC)   8. Class 3 severe obesity due to excess calories with serious comorbidity and body mass index (BMI) of 40.0 to 44.9 in adult [facility])  9. OSA (obstructive sleep apnea)    Return in about 4 months (around 09/09/2021).      I, Wilhemena Durie, MD, have reviewed all documentation for this visit. The documentation on 05/14/21 for the exam, diagnosis, procedures, and orders are all accurate and complete.    Daquane Aguilar Cranford Mon, MD  [facility] [phone] (phone) [phone] (fax)  [place]

## 2021-05-12 ENCOUNTER — Other Ambulatory Visit: Payer: Self-pay

## 2021-05-12 ENCOUNTER — Ambulatory Visit (INDEPENDENT_AMBULATORY_CARE_PROVIDER_SITE_OTHER): Payer: BC Managed Care – PPO | Admitting: Family Medicine

## 2021-05-12 ENCOUNTER — Encounter: Payer: Self-pay | Admitting: Family Medicine

## 2021-05-12 VITALS — BP 129/76 | HR 58 | Temp 98.4°F | Resp 18 | Ht 72.0 in | Wt 319.0 lb

## 2021-05-12 DIAGNOSIS — D649 Anemia, unspecified: Secondary | ICD-10-CM | POA: Diagnosis not present

## 2021-05-12 DIAGNOSIS — E1169 Type 2 diabetes mellitus with other specified complication: Secondary | ICD-10-CM

## 2021-05-12 DIAGNOSIS — N189 Chronic kidney disease, unspecified: Secondary | ICD-10-CM | POA: Diagnosis not present

## 2021-05-12 DIAGNOSIS — R188 Other ascites: Secondary | ICD-10-CM

## 2021-05-12 DIAGNOSIS — G4733 Obstructive sleep apnea (adult) (pediatric): Secondary | ICD-10-CM

## 2021-05-12 DIAGNOSIS — K7290 Hepatic failure, unspecified without coma: Secondary | ICD-10-CM

## 2021-05-12 DIAGNOSIS — K7460 Unspecified cirrhosis of liver: Secondary | ICD-10-CM

## 2021-05-12 DIAGNOSIS — E669 Obesity, unspecified: Secondary | ICD-10-CM

## 2021-05-12 DIAGNOSIS — K766 Portal hypertension: Secondary | ICD-10-CM

## 2021-05-12 DIAGNOSIS — R059 Cough, unspecified: Secondary | ICD-10-CM

## 2021-05-12 DIAGNOSIS — Z6841 Body Mass Index (BMI) 40.0 and over, adult: Secondary | ICD-10-CM

## 2021-05-12 MED ORDER — ALBUTEROL SULFATE HFA 108 (90 BASE) MCG/ACT IN AERS: 2.0000 | INHALATION_SPRAY | Freq: Four times a day (QID) | RESPIRATORY_TRACT | 1 refills | Status: DC | PRN

## 2021-05-12 NOTE — Patient Instructions (Addendum)
Stop Trulicity!! Follow up in March!!

## 2021-05-18 DIAGNOSIS — K766 Portal hypertension: Secondary | ICD-10-CM | POA: Diagnosis not present

## 2021-05-18 DIAGNOSIS — E785 Hyperlipidemia, unspecified: Secondary | ICD-10-CM | POA: Diagnosis not present

## 2021-05-18 DIAGNOSIS — E119 Type 2 diabetes mellitus without complications: Secondary | ICD-10-CM | POA: Diagnosis not present

## 2021-05-18 DIAGNOSIS — I5083 High output heart failure: Secondary | ICD-10-CM | POA: Diagnosis not present

## 2021-05-18 DIAGNOSIS — Z01818 Encounter for other preprocedural examination: Secondary | ICD-10-CM | POA: Diagnosis not present

## 2021-05-19 DIAGNOSIS — K7581 Nonalcoholic steatohepatitis (NASH): Secondary | ICD-10-CM | POA: Diagnosis not present

## 2021-05-19 DIAGNOSIS — I5083 High output heart failure: Secondary | ICD-10-CM | POA: Diagnosis not present

## 2021-05-19 DIAGNOSIS — K7460 Unspecified cirrhosis of liver: Secondary | ICD-10-CM | POA: Diagnosis not present

## 2021-05-19 DIAGNOSIS — K766 Portal hypertension: Secondary | ICD-10-CM | POA: Diagnosis not present

## 2021-05-19 DIAGNOSIS — Z01818 Encounter for other preprocedural examination: Secondary | ICD-10-CM | POA: Diagnosis not present

## 2021-05-19 DIAGNOSIS — D638 Anemia in other chronic diseases classified elsewhere: Secondary | ICD-10-CM | POA: Diagnosis not present

## 2021-05-19 DIAGNOSIS — E1122 Type 2 diabetes mellitus with diabetic chronic kidney disease: Secondary | ICD-10-CM | POA: Diagnosis not present

## 2021-05-19 DIAGNOSIS — Z01810 Encounter for preprocedural cardiovascular examination: Secondary | ICD-10-CM | POA: Diagnosis not present

## 2021-05-19 DIAGNOSIS — G9340 Encephalopathy, unspecified: Secondary | ICD-10-CM | POA: Diagnosis not present

## 2021-05-19 DIAGNOSIS — K760 Fatty (change of) liver, not elsewhere classified: Secondary | ICD-10-CM | POA: Diagnosis not present

## 2021-05-19 DIAGNOSIS — Z01811 Encounter for preprocedural respiratory examination: Secondary | ICD-10-CM | POA: Diagnosis not present

## 2021-05-19 DIAGNOSIS — I8510 Secondary esophageal varices without bleeding: Secondary | ICD-10-CM | POA: Diagnosis not present

## 2021-05-19 DIAGNOSIS — R188 Other ascites: Secondary | ICD-10-CM | POA: Diagnosis not present

## 2021-05-19 DIAGNOSIS — R0602 Shortness of breath: Secondary | ICD-10-CM | POA: Diagnosis not present

## 2021-05-20 DIAGNOSIS — M792 Neuralgia and neuritis, unspecified: Secondary | ICD-10-CM | POA: Diagnosis not present

## 2021-05-20 DIAGNOSIS — D500 Iron deficiency anemia secondary to blood loss (chronic): Secondary | ICD-10-CM | POA: Diagnosis not present

## 2021-05-20 DIAGNOSIS — R188 Other ascites: Secondary | ICD-10-CM | POA: Diagnosis not present

## 2021-05-20 DIAGNOSIS — I864 Gastric varices: Secondary | ICD-10-CM | POA: Diagnosis not present

## 2021-05-20 DIAGNOSIS — R5381 Other malaise: Secondary | ICD-10-CM | POA: Diagnosis not present

## 2021-05-20 DIAGNOSIS — N186 End stage renal disease: Secondary | ICD-10-CM | POA: Diagnosis not present

## 2021-05-20 DIAGNOSIS — F54 Psychological and behavioral factors associated with disorders or diseases classified elsewhere: Secondary | ICD-10-CM | POA: Diagnosis not present

## 2021-05-20 DIAGNOSIS — K7581 Nonalcoholic steatohepatitis (NASH): Secondary | ICD-10-CM | POA: Diagnosis not present

## 2021-05-20 DIAGNOSIS — E1122 Type 2 diabetes mellitus with diabetic chronic kidney disease: Secondary | ICD-10-CM | POA: Diagnosis not present

## 2021-05-20 DIAGNOSIS — N200 Calculus of kidney: Secondary | ICD-10-CM | POA: Diagnosis not present

## 2021-05-20 DIAGNOSIS — B0229 Other postherpetic nervous system involvement: Secondary | ICD-10-CM | POA: Diagnosis not present

## 2021-05-20 DIAGNOSIS — K7682 Hepatic encephalopathy: Secondary | ICD-10-CM | POA: Diagnosis not present

## 2021-05-20 DIAGNOSIS — I8500 Esophageal varices without bleeding: Secondary | ICD-10-CM | POA: Diagnosis not present

## 2021-05-20 DIAGNOSIS — K7460 Unspecified cirrhosis of liver: Secondary | ICD-10-CM | POA: Diagnosis not present

## 2021-05-20 DIAGNOSIS — Z636 Dependent relative needing care at home: Secondary | ICD-10-CM | POA: Diagnosis not present

## 2021-05-20 DIAGNOSIS — Z01818 Encounter for other preprocedural examination: Secondary | ICD-10-CM | POA: Diagnosis not present

## 2021-05-20 DIAGNOSIS — K8020 Calculus of gallbladder without cholecystitis without obstruction: Secondary | ICD-10-CM | POA: Diagnosis not present

## 2021-05-20 DIAGNOSIS — G4733 Obstructive sleep apnea (adult) (pediatric): Secondary | ICD-10-CM | POA: Diagnosis not present

## 2021-05-20 DIAGNOSIS — K766 Portal hypertension: Secondary | ICD-10-CM | POA: Diagnosis not present

## 2021-05-27 ENCOUNTER — Other Ambulatory Visit: Payer: Self-pay | Admitting: Family Medicine

## 2021-05-27 ENCOUNTER — Inpatient Hospital Stay: Payer: BC Managed Care – PPO | Admitting: Family Medicine

## 2021-05-27 DIAGNOSIS — E1169 Type 2 diabetes mellitus with other specified complication: Secondary | ICD-10-CM

## 2021-05-27 NOTE — Telephone Encounter (Signed)
Requested Prescriptions  Pending Prescriptions Disp Refills  . JARDIANCE 25 MG TABS tablet [Pharmacy Med Name: Jardiance 25 MG Oral Tablet] 90 tablet 3    Sig: TAKE 1 TABLET BY MOUTH  DAILY     Endocrinology:  Diabetes - SGLT2 Inhibitors Failed - 05/27/2021 12:01 AM      Failed - Cr in normal range and within 360 days    Creatinine  Date Value Ref Range Status  06/11/2013 1.14 0.60 - 1.30 mg/dL Final   Creatinine, Ser  Date Value Ref Range Status  04/01/2021 2.25 (H) 0.61 - 1.24 mg/dL Final         Failed - eGFR in normal range and within 360 days    EGFR (African American)  Date Value Ref Range Status  06/11/2013 >60  Final   GFR calc Af Amer  Date Value Ref Range Status  02/13/2020 68 >59 mL/min/1.73 Final    Comment:    **[facility] currently reports eGFR in compliance with the current**   recommendations of the [employer]. [facility] will   update reporting as new guidelines are published from the NKF-ASN   Task force.    EGFR (Non-African Amer.)  Date Value Ref Range Status  06/11/2013 >60  Final    Comment:    eGFR values <66m/min/1.73 m2 may be an indication of chronic kidney disease (CKD). Calculated eGFR is useful in patients with stable renal function. The eGFR calculation will not be reliable in acutely ill patients when serum creatinine is changing rapidly. It is not useful in  patients on dialysis. The eGFR calculation may not be applicable to patients at the low and high extremes of body sizes, pregnant women, and vegetarians.    GFR, Estimated  Date Value Ref Range Status  04/01/2021 33 (L) >60 mL/min Final    Comment:    (NOTE) Calculated using the CKD-EPI Creatinine Equation (2021)    eGFR  Date Value Ref Range Status  02/16/2021 51 (L) >59 mL/min/1.73 Final         Passed - LDL in normal range and within 360 days    LDL Chol Calc (NIH)  Date Value Ref Range Status  10/27/2020 37 0 - 99 mg/dL Final         Passed - HBA1C is  between 0 and 7.9 and within 180 days    Hemoglobin A1C  Date Value Ref Range Status  02/16/2021 5.0 4.0 - 5.6 % Final   Hgb A1c MFr Bld  Date Value Ref Range Status  10/27/2020 7.3 (H) 4.8 - 5.6 % Final    Comment:             Prediabetes: 5.7 - 6.4          Diabetes: >6.4          Glycemic control for adults with diabetes: <7.0          Passed - Valid encounter within last 6 months    Recent Outpatient Visits          2 weeks ago [facility]   1 [facility], [facility]   3 months ago Diabetes mellitus type 2 in obese [facility]   3 months ago Cirrhosis of liver with ascites, unspecified hepatic cirrhosis type ([facility]   [place]  Retia Passe., [place]   4 months ago Diabetes mellitus type 2 in obese [facility])   [facility] Jerrol Banana., MD

## 2021-06-08 DIAGNOSIS — K7460 Unspecified cirrhosis of liver: Secondary | ICD-10-CM | POA: Diagnosis not present

## 2021-06-08 DIAGNOSIS — K7581 Nonalcoholic steatohepatitis (NASH): Secondary | ICD-10-CM | POA: Diagnosis not present

## 2021-06-17 ENCOUNTER — Other Ambulatory Visit: Payer: Self-pay | Admitting: Internal Medicine

## 2021-06-17 DIAGNOSIS — K7581 Nonalcoholic steatohepatitis (NASH): Secondary | ICD-10-CM | POA: Diagnosis not present

## 2021-06-17 DIAGNOSIS — I8500 Esophageal varices without bleeding: Secondary | ICD-10-CM | POA: Diagnosis not present

## 2021-06-17 DIAGNOSIS — K766 Portal hypertension: Secondary | ICD-10-CM | POA: Diagnosis not present

## 2021-06-17 DIAGNOSIS — I864 Gastric varices: Secondary | ICD-10-CM | POA: Diagnosis not present

## 2021-06-17 DIAGNOSIS — K7460 Unspecified cirrhosis of liver: Secondary | ICD-10-CM | POA: Diagnosis not present

## 2021-06-18 ENCOUNTER — Ambulatory Visit: Payer: BC Managed Care – PPO

## 2021-06-19 ENCOUNTER — Other Ambulatory Visit: Payer: Self-pay | Admitting: Internal Medicine

## 2021-06-19 ENCOUNTER — Ambulatory Visit
Admission: RE | Admit: 2021-06-19 | Discharge: 2021-06-19 | Disposition: A | Discharge status: Home / Self Care | Payer: BC Managed Care – PPO | Source: Ambulatory Visit | Attending: Internal Medicine | Admitting: Internal Medicine

## 2021-06-19 ENCOUNTER — Other Ambulatory Visit: Payer: Self-pay

## 2021-06-19 DIAGNOSIS — K7581 Nonalcoholic steatohepatitis (NASH): Secondary | ICD-10-CM | POA: Insufficient documentation

## 2021-06-19 DIAGNOSIS — R188 Other ascites: Secondary | ICD-10-CM | POA: Diagnosis not present

## 2021-06-19 NOTE — Progress Notes (Signed)
Patient ID: Jesse Zimmerman, male   DOB: 08/12/1960, 60 y.o.   MRN: [id number] Patient presented to ultrasound department today for therapeutic paracentesis.  On limited ultrasound of abdomen in all 4 quadrants there is a small to moderate amount of ascites primarily on the left side.  Patient states he is feeling better today with increase in urine output on diuretic therapy.  His latest platelet count from last month was 22,000.  Patient would prefer that we not proceed today unless a large amount of fluid is present. Above discussed with Dr. Ky Barban Abd(IR attending) and decision made to hold on paracentesis today until further lab evaluation reveals adequate platelet count to safely proceed with case in light of patient's body habitus and smaller amount of fluid noted today.  Patient in agreement with above.

## 2021-07-09 DIAGNOSIS — D631 Anemia in chronic kidney disease: Secondary | ICD-10-CM | POA: Diagnosis not present

## 2021-07-09 DIAGNOSIS — N183 Chronic kidney disease, stage 3 unspecified: Secondary | ICD-10-CM | POA: Diagnosis not present

## 2021-07-10 DIAGNOSIS — Z01818 Encounter for other preprocedural examination: Secondary | ICD-10-CM | POA: Diagnosis not present

## 2021-07-10 DIAGNOSIS — N1831 Chronic kidney disease, stage 3a: Secondary | ICD-10-CM | POA: Diagnosis not present

## 2021-07-10 DIAGNOSIS — Z7682 Awaiting organ transplant status: Secondary | ICD-10-CM | POA: Diagnosis not present

## 2021-07-10 DIAGNOSIS — I129 Hypertensive chronic kidney disease with stage 1 through stage 4 chronic kidney disease, or unspecified chronic kidney disease: Secondary | ICD-10-CM | POA: Diagnosis not present

## 2021-07-10 DIAGNOSIS — I8500 Esophageal varices without bleeding: Secondary | ICD-10-CM | POA: Diagnosis not present

## 2021-07-10 DIAGNOSIS — Z6841 Body Mass Index (BMI) 40.0 and over, adult: Secondary | ICD-10-CM | POA: Diagnosis not present

## 2021-07-10 DIAGNOSIS — E1122 Type 2 diabetes mellitus with diabetic chronic kidney disease: Secondary | ICD-10-CM | POA: Diagnosis not present

## 2021-07-10 DIAGNOSIS — Z1159 Encounter for screening for other viral diseases: Secondary | ICD-10-CM | POA: Diagnosis not present

## 2021-07-10 DIAGNOSIS — K7460 Unspecified cirrhosis of liver: Secondary | ICD-10-CM | POA: Diagnosis not present

## 2021-08-04 DIAGNOSIS — Z01818 Encounter for other preprocedural examination: Secondary | ICD-10-CM | POA: Diagnosis not present

## 2021-08-04 DIAGNOSIS — E1122 Type 2 diabetes mellitus with diabetic chronic kidney disease: Secondary | ICD-10-CM | POA: Diagnosis not present

## 2021-08-04 DIAGNOSIS — N186 End stage renal disease: Secondary | ICD-10-CM | POA: Diagnosis not present

## 2021-08-19 DIAGNOSIS — K7581 Nonalcoholic steatohepatitis (NASH): Secondary | ICD-10-CM | POA: Diagnosis not present

## 2021-08-19 DIAGNOSIS — E1122 Type 2 diabetes mellitus with diabetic chronic kidney disease: Secondary | ICD-10-CM | POA: Diagnosis not present

## 2021-08-19 DIAGNOSIS — Z713 Dietary counseling and surveillance: Secondary | ICD-10-CM | POA: Diagnosis not present

## 2021-08-19 DIAGNOSIS — R188 Other ascites: Secondary | ICD-10-CM | POA: Diagnosis not present

## 2021-08-19 DIAGNOSIS — D696 Thrombocytopenia, unspecified: Secondary | ICD-10-CM | POA: Diagnosis not present

## 2021-08-19 DIAGNOSIS — K766 Portal hypertension: Secondary | ICD-10-CM | POA: Diagnosis not present

## 2021-08-19 DIAGNOSIS — K7460 Unspecified cirrhosis of liver: Secondary | ICD-10-CM | POA: Diagnosis not present

## 2021-08-19 DIAGNOSIS — R202 Paresthesia of skin: Secondary | ICD-10-CM | POA: Diagnosis not present

## 2021-08-19 DIAGNOSIS — D638 Anemia in other chronic diseases classified elsewhere: Secondary | ICD-10-CM | POA: Diagnosis not present

## 2021-08-19 DIAGNOSIS — Z01811 Encounter for preprocedural respiratory examination: Secondary | ICD-10-CM | POA: Diagnosis not present

## 2021-08-19 DIAGNOSIS — G9340 Encephalopathy, unspecified: Secondary | ICD-10-CM | POA: Diagnosis not present

## 2021-08-19 DIAGNOSIS — Z01810 Encounter for preprocedural cardiovascular examination: Secondary | ICD-10-CM | POA: Diagnosis not present

## 2021-08-19 DIAGNOSIS — D500 Iron deficiency anemia secondary to blood loss (chronic): Secondary | ICD-10-CM | POA: Diagnosis not present

## 2021-08-19 DIAGNOSIS — N1831 Chronic kidney disease, stage 3a: Secondary | ICD-10-CM | POA: Diagnosis not present

## 2021-08-19 DIAGNOSIS — Z01818 Encounter for other preprocedural examination: Secondary | ICD-10-CM | POA: Diagnosis not present

## 2021-08-19 DIAGNOSIS — D631 Anemia in chronic kidney disease: Secondary | ICD-10-CM | POA: Diagnosis not present

## 2021-08-19 DIAGNOSIS — K760 Fatty (change of) liver, not elsewhere classified: Secondary | ICD-10-CM | POA: Diagnosis not present

## 2021-08-19 DIAGNOSIS — N183 Chronic kidney disease, stage 3 unspecified: Secondary | ICD-10-CM | POA: Diagnosis not present

## 2021-08-19 DIAGNOSIS — R0602 Shortness of breath: Secondary | ICD-10-CM | POA: Diagnosis not present

## 2021-08-19 DIAGNOSIS — K921 Melena: Secondary | ICD-10-CM | POA: Diagnosis not present

## 2021-08-19 DIAGNOSIS — E782 Mixed hyperlipidemia: Secondary | ICD-10-CM | POA: Diagnosis not present

## 2021-08-19 DIAGNOSIS — I8510 Secondary esophageal varices without bleeding: Secondary | ICD-10-CM | POA: Diagnosis not present

## 2021-08-24 DIAGNOSIS — Z7682 Awaiting organ transplant status: Secondary | ICD-10-CM | POA: Diagnosis not present

## 2021-08-24 DIAGNOSIS — F54 Psychological and behavioral factors associated with disorders or diseases classified elsewhere: Secondary | ICD-10-CM | POA: Diagnosis not present

## 2021-08-25 DIAGNOSIS — D631 Anemia in chronic kidney disease: Secondary | ICD-10-CM | POA: Diagnosis not present

## 2021-08-25 DIAGNOSIS — N183 Chronic kidney disease, stage 3 unspecified: Secondary | ICD-10-CM | POA: Diagnosis not present

## 2021-08-27 ENCOUNTER — Encounter: Payer: Self-pay | Admitting: Pulmonary Disease

## 2021-08-27 ENCOUNTER — Ambulatory Visit: Payer: BC Managed Care – PPO | Admitting: Pulmonary Disease

## 2021-08-27 ENCOUNTER — Other Ambulatory Visit: Payer: Self-pay

## 2021-08-27 VITALS — BP 118/60 | HR 64 | Temp 97.7°F | Ht 72.0 in | Wt 329.0 lb

## 2021-08-27 DIAGNOSIS — Z7189 Other specified counseling: Secondary | ICD-10-CM

## 2021-08-27 DIAGNOSIS — G4733 Obstructive sleep apnea (adult) (pediatric): Secondary | ICD-10-CM

## 2021-08-27 NOTE — Patient Instructions (Signed)
Will arrange for auto CPAP set up ? ?Follow up in 4 to 5 months ?

## 2021-08-27 NOTE — Progress Notes (Signed)
? ?[facility] ? ?Chief Complaint  ?Patient presents with  ? Follow-up  ?  Review HST  ? ? ?Constitutional:  ?BP 118/60 (BP Location: Left Arm, Cuff Size: Large)   Pulse 64   Temp 97.7 ?F (36.5 ?C) (Temporal)   Ht 6' (1.829 m)   Wt (!) 329 lb (149.2 kg)   SpO2 100%   BMI 44.62 kg/m?  ? ?Past Medical History:  ?Back pain, NASH with cirrhosis, DM type 2, Esophageal varices, HLD ? ?Past Surgical History:  ?He  has a past surgical history that includes Kidney stone surgery; Excessive thigh / hip / buttock / flank skin excision; Tonsillectomy and adenoidectomy; Esophagogastroduodenoscopy (egd) with propofol (N/A, 04/18/2017); Colonoscopy with propofol (N/A, 08/12/2017); and Esophagogastroduodenoscopy (egd) with propofol (N/A, 07/18/2018). ? ?Brief Summary:  ?Jesse Zimmerman is a 62 y.o. male with snoring. ?  ? ? ? ?Subjective:  ? ?He is here with a friend. ? ?He had home sleep study that showed mild sleep apnea.  He was in hospital at [facility] with internal bleeding.  He is being assessed for liver and renal transplant. ? ? ?Physical Exam:  ? ?Appearance - well kempt  ? ?ENMT - no sinus tenderness, no oral exudate, no LAN, Mallampati 4 airway, no stridor ? ?Respiratory - equal breath sounds bilaterally, no wheezing or rales ? ?CV - s1s2 regular rate and rhythm, no murmurs ? ?Ext - no clubbing, no edema ? ?Skin - no rashes ? ?Psych - normal mood and affect ?  ?Sleep Tests:  ?HST 04/15/21 >> AHI 10.4, SpO2 low 74% ? ?Social History:  ?He  reports that he has never smoked. He has never used smokeless tobacco. He reports that he does not drink alcohol and does not use drugs. ? ?Family History:  ?His family history includes Breast cancer in his mother; Colon cancer in his maternal aunt, maternal grandmother, and paternal grandmother; Dementia in his mother; Gout in his father; Heart attack in his father; Hypertension in his father, mother, sister, and sister. ?  ? ? ?Assessment/Plan:   ? ?Obstructive sleep apnea. ?- reviewed his sleep study results ?- discussed how sleep apnea can impact his health ?- treatment options reviewed ?- will arrange for auto CPAP set up ? ?NASH with cirrhosis. ?- followed by liver/renal transplant team at [facility] ? ?Obesity. ?- discussed how weight can impact sleep and risk for sleep disordered breathing ?- discussed options to assist with weight loss: combination of diet modification, cardiovascular and strength training exercises ? ?Time Spent Involved in Patient Care on Day of Examination:  ?27 minutes ? ?Follow up:  ? ?Patient Instructions  ?Will arrange for auto CPAP set up ? ?Follow up in 4 to 5 months ? ?Medication List:  ? ?Allergies as of 08/27/2021   ? ?   Reactions  ? Perflutren Lipid Microspheres   ? Other reaction(s): Other (See Comments) ?Back pain  ? Latex   ? LATEX TAPE  ? Pineapple Hives  ? ?  ? ?  ?Medication List  ?  ? ?  ? Accurate as of August 27, 2021 10:49 AM. If you have any questions, ask your nurse or doctor.  ?  ?  ? ?  ? ?STOP taking these medications   ? ?LORazepam 0.5 MG tablet ?Commonly known as: ATIVAN ?Stopped by: Chesley Mires, MD ?  ?sertraline 50 MG tablet ?Commonly known as: ZOLOFT ?Stopped by: Chesley Mires, MD ?  ? ?  ? ?TAKE these  medications   ? ?albuterol 108 (90 Base) MCG/ACT inhaler ?Commonly known as: VENTOLIN HFA ?Inhale 2 puffs into the lungs every 6 (six) hours as needed for wheezing or shortness of breath. ?  ?blood glucose meter kit and supplies ?Dispense based on patient and insurance preference. Use once daily as directed. (FOR ICD-10 E11.69). ?  ?cetirizine 10 MG tablet ?Commonly known as: ZYRTEC ?Take 1 tablet (10 mg total) by mouth daily. ?  ?FreeStyle Libre 2 Reader Kerrin Mo ?1 each by Does not apply route daily. ?  ?FreeStyle Libre 2 Sensor Misc ?1 each by Does not apply route daily. ?  ?furosemide 20 MG tablet ?Commonly known as: LASIX ?Take 2 tablets (40 mg total) by mouth daily. ?  ?Jardiance 25 MG Tabs tablet ?Generic drug:  empagliflozin ?TAKE 1 TABLET BY MOUTH  DAILY ?  ?loratadine 10 MG tablet ?Commonly known as: CLARITIN ?Take 1 tablet (10 mg total) by mouth daily. ?  ?omeprazole 20 MG capsule ?Commonly known as: PRILOSEC ?Take 1 capsule (20 mg total) by mouth daily. ?  ?ondansetron 8 MG disintegrating tablet ?Commonly known as: ZOFRAN-ODT ?Take 1 tablet (8 mg total) by mouth every 8 (eight) hours as needed for nausea or vomiting. ?  ?propranolol 20 MG tablet ?Commonly known as: INDERAL ?Take 1 tablet (20 mg total) by mouth 2 (two) times daily. ?  ?spironolactone 100 MG tablet ?Commonly known as: ALDACTONE ?TAKE ONE-HALF TABLET BY  MOUTH daily ?  ?Trulicity 6.04 VW/0.9WJ Sopn ?Generic drug: Dulaglutide ?Inject 0.75 mg into the skin once a week. ?  ? ?  ? ? ?Signature:  ?Chesley Mires, MD ?[place] ?Pager - [phone] ?08/27/2021, 10:49 AM ?  ? ? ? ? ? ? ? ? ?

## 2021-09-07 DIAGNOSIS — N183 Chronic kidney disease, stage 3 unspecified: Secondary | ICD-10-CM | POA: Diagnosis not present

## 2021-09-07 DIAGNOSIS — D631 Anemia in chronic kidney disease: Secondary | ICD-10-CM | POA: Diagnosis not present

## 2021-09-09 DIAGNOSIS — D631 Anemia in chronic kidney disease: Secondary | ICD-10-CM | POA: Diagnosis not present

## 2021-09-09 DIAGNOSIS — N183 Chronic kidney disease, stage 3 unspecified: Secondary | ICD-10-CM | POA: Diagnosis not present

## 2021-09-13 ENCOUNTER — Emergency Department: Payer: BC Managed Care – PPO

## 2021-09-13 ENCOUNTER — Encounter: Payer: Self-pay | Admitting: Internal Medicine

## 2021-09-13 ENCOUNTER — Observation Stay
Admission: EM | Admit: 2021-09-13 | Discharge: 2021-09-14 | Disposition: A | Discharge status: Home / Self Care | Payer: BC Managed Care – PPO | Attending: Internal Medicine | Admitting: Internal Medicine

## 2021-09-13 ENCOUNTER — Other Ambulatory Visit: Payer: Self-pay

## 2021-09-13 DIAGNOSIS — R42 Dizziness and giddiness: Secondary | ICD-10-CM | POA: Insufficient documentation

## 2021-09-13 DIAGNOSIS — D509 Iron deficiency anemia, unspecified: Secondary | ICD-10-CM | POA: Diagnosis not present

## 2021-09-13 DIAGNOSIS — R188 Other ascites: Secondary | ICD-10-CM | POA: Diagnosis not present

## 2021-09-13 DIAGNOSIS — K8020 Calculus of gallbladder without cholecystitis without obstruction: Secondary | ICD-10-CM | POA: Diagnosis not present

## 2021-09-13 DIAGNOSIS — E1122 Type 2 diabetes mellitus with diabetic chronic kidney disease: Secondary | ICD-10-CM | POA: Insufficient documentation

## 2021-09-13 DIAGNOSIS — K7460 Unspecified cirrhosis of liver: Secondary | ICD-10-CM | POA: Diagnosis not present

## 2021-09-13 DIAGNOSIS — N1832 Chronic kidney disease, stage 3b: Secondary | ICD-10-CM | POA: Diagnosis not present

## 2021-09-13 DIAGNOSIS — E66813 Obesity, class 3: Secondary | ICD-10-CM | POA: Diagnosis present

## 2021-09-13 DIAGNOSIS — E1129 Type 2 diabetes mellitus with other diabetic kidney complication: Secondary | ICD-10-CM | POA: Diagnosis present

## 2021-09-13 DIAGNOSIS — E7220 Disorder of urea cycle metabolism, unspecified: Secondary | ICD-10-CM | POA: Diagnosis not present

## 2021-09-13 DIAGNOSIS — D696 Thrombocytopenia, unspecified: Secondary | ICD-10-CM | POA: Diagnosis present

## 2021-09-13 DIAGNOSIS — Z7984 Long term (current) use of oral hypoglycemic drugs: Secondary | ICD-10-CM | POA: Insufficient documentation

## 2021-09-13 DIAGNOSIS — R1110 Vomiting, unspecified: Secondary | ICD-10-CM | POA: Diagnosis not present

## 2021-09-13 DIAGNOSIS — K7689 Other specified diseases of liver: Secondary | ICD-10-CM | POA: Diagnosis not present

## 2021-09-13 DIAGNOSIS — R7989 Other specified abnormal findings of blood chemistry: Secondary | ICD-10-CM | POA: Diagnosis not present

## 2021-09-13 DIAGNOSIS — K219 Gastro-esophageal reflux disease without esophagitis: Secondary | ICD-10-CM | POA: Diagnosis not present

## 2021-09-13 DIAGNOSIS — R251 Tremor, unspecified: Secondary | ICD-10-CM | POA: Diagnosis not present

## 2021-09-13 DIAGNOSIS — K7581 Nonalcoholic steatohepatitis (NASH): Secondary | ICD-10-CM | POA: Diagnosis not present

## 2021-09-13 DIAGNOSIS — M7989 Other specified soft tissue disorders: Secondary | ICD-10-CM

## 2021-09-13 DIAGNOSIS — D539 Nutritional anemia, unspecified: Secondary | ICD-10-CM | POA: Diagnosis present

## 2021-09-13 DIAGNOSIS — E1169 Type 2 diabetes mellitus with other specified complication: Secondary | ICD-10-CM

## 2021-09-13 DIAGNOSIS — Z91040 Latex allergy status: Secondary | ICD-10-CM | POA: Diagnosis not present

## 2021-09-13 LAB — PROTIME-INR
INR: 1.7 — ABNORMAL HIGH (ref 0.8–1.2)
Prothrombin Time: 20 seconds — ABNORMAL HIGH (ref 11.4–15.2)

## 2021-09-13 LAB — CBC WITH DIFFERENTIAL/PLATELET
Abs Immature Granulocytes: 0.01 10*3/uL (ref 0.00–0.07)
Basophils Absolute: 0.1 10*3/uL (ref 0.0–0.1)
Basophils Relative: 1 %
Eosinophils Absolute: 0.4 10*3/uL (ref 0.0–0.5)
Eosinophils Relative: 9 %
HCT: 23.3 % — ABNORMAL LOW (ref 39.0–52.0)
Hemoglobin: 7.8 g/dL — ABNORMAL LOW (ref 13.0–17.0)
Immature Granulocytes: 0 %
Lymphocytes Relative: 16 %
Lymphs Abs: 0.7 10*3/uL (ref 0.7–4.0)
MCH: 34.4 pg — ABNORMAL HIGH (ref 26.0–34.0)
MCHC: 33.5 g/dL (ref 30.0–36.0)
MCV: 102.6 fL — ABNORMAL HIGH (ref 80.0–100.0)
Monocytes Absolute: 0.4 10*3/uL (ref 0.1–1.0)
Monocytes Relative: 9 %
Neutro Abs: 2.8 10*3/uL (ref 1.7–7.7)
Neutrophils Relative %: 65 %
Platelets: 39 10*3/uL — ABNORMAL LOW (ref 150–400)
RBC: 2.27 MIL/uL — ABNORMAL LOW (ref 4.22–5.81)
RDW: 14.9 % (ref 11.5–15.5)
Smear Review: DECREASED
WBC: 4.3 10*3/uL (ref 4.0–10.5)
nRBC: 0 % (ref 0.0–0.2)

## 2021-09-13 LAB — COMPREHENSIVE METABOLIC PANEL
ALT: 25 U/L (ref 0–44)
AST: 46 U/L — ABNORMAL HIGH (ref 15–41)
Albumin: 2.7 g/dL — ABNORMAL LOW (ref 3.5–5.0)
Alkaline Phosphatase: 102 U/L (ref 38–126)
Anion gap: 5 (ref 5–15)
BUN: 36 mg/dL — ABNORMAL HIGH (ref 6–20)
CO2: 27 mmol/L (ref 22–32)
Calcium: 8.4 mg/dL — ABNORMAL LOW (ref 8.9–10.3)
Chloride: 105 mmol/L (ref 98–111)
Creatinine, Ser: 2.45 mg/dL — ABNORMAL HIGH (ref 0.61–1.24)
GFR, Estimated: 29 mL/min — ABNORMAL LOW (ref >60)
Glucose, Bld: 198 mg/dL — ABNORMAL HIGH (ref 70–99)
Potassium: 4.4 mmol/L (ref 3.5–5.1)
Sodium: 137 mmol/L (ref 135–145)
Total Bilirubin: 1.9 mg/dL — ABNORMAL HIGH (ref 0.3–1.2)
Total Protein: 6.9 g/dL (ref 6.5–8.1)

## 2021-09-13 LAB — TYPE AND SCREEN
ABO/RH(D): O POS
Antibody Screen: NEGATIVE

## 2021-09-13 LAB — URINALYSIS, ROUTINE W REFLEX MICROSCOPIC
Bacteria, UA: NONE SEEN
Bilirubin Urine: NEGATIVE
Glucose, UA: 150 mg/dL — AB
Ketones, ur: NEGATIVE mg/dL
Leukocytes,Ua: NEGATIVE
Nitrite: NEGATIVE
Protein, ur: NEGATIVE mg/dL
Specific Gravity, Urine: 1.011 (ref 1.005–1.030)
pH: 5 (ref 5.0–8.0)

## 2021-09-13 LAB — GLUCOSE, CAPILLARY
Glucose-Capillary: 117 mg/dL — ABNORMAL HIGH (ref 70–99)
Glucose-Capillary: 102 mg/dL — ABNORMAL HIGH (ref 70–99)

## 2021-09-13 LAB — LIPASE, BLOOD: Lipase: 175 U/L — ABNORMAL HIGH (ref 11–51)

## 2021-09-13 LAB — AMMONIA: Ammonia: 148 umol/L — ABNORMAL HIGH (ref 9–35)

## 2021-09-13 LAB — MAGNESIUM: Magnesium: 2.2 mg/dL (ref 1.7–2.4)

## 2021-09-13 MED ORDER — INSULIN ASPART 100 UNIT/ML IJ SOLN: 0.0000 [IU] | Freq: Three times a day (TID) | INTRAMUSCULAR | Status: DC

## 2021-09-13 MED ORDER — FERROUS SULFATE 325 (65 FE) MG PO TABS
325.0000 mg | ORAL_TABLET | Freq: Every morning | ORAL | Status: DC
  Administered 2021-09-14: 325 mg via ORAL
  Filled 2021-09-13: qty 1

## 2021-09-13 MED ORDER — ALBUTEROL SULFATE (2.5 MG/3ML) 0.083% IN NEBU: 2.5000 mg | INHALATION_SOLUTION | RESPIRATORY_TRACT | Status: DC | PRN

## 2021-09-13 MED ORDER — ONDANSETRON HCL 4 MG/2ML IJ SOLN
4.0000 mg | Freq: Three times a day (TID) | INTRAMUSCULAR | Status: DC | PRN
  Administered 2021-09-14: 4 mg via INTRAVENOUS
  Filled 2021-09-13: qty 2

## 2021-09-13 MED ORDER — INSULIN ASPART 100 UNIT/ML IJ SOLN: 0.0000 [IU] | Freq: Every day | INTRAMUSCULAR | Status: DC

## 2021-09-13 MED ORDER — PANTOPRAZOLE SODIUM 40 MG PO TBEC
40.0000 mg | DELAYED_RELEASE_TABLET | Freq: Every day | ORAL | Status: DC
  Administered 2021-09-13 – 2021-09-14 (×2): 40 mg via ORAL
  Filled 2021-09-13 (×2): qty 1

## 2021-09-13 MED ORDER — PROPRANOLOL HCL 20 MG PO TABS
20.0000 mg | ORAL_TABLET | Freq: Two times a day (BID) | ORAL | Status: DC
Start: 2021-09-13 — End: 2021-09-14
  Administered 2021-09-13 – 2021-09-14 (×2): 20 mg via ORAL
  Filled 2021-09-13 (×2): qty 1

## 2021-09-13 MED ORDER — ONDANSETRON 4 MG PO TBDP
4.0000 mg | ORAL_TABLET | Freq: Once | ORAL | Status: AC
  Administered 2021-09-13: 4 mg via ORAL
  Filled 2021-09-13: qty 1

## 2021-09-13 MED ORDER — LACTULOSE 10 GM/15ML PO SOLN
30.0000 g | Freq: Two times a day (BID) | ORAL | Status: DC
  Administered 2021-09-13 – 2021-09-14 (×3): 30 g via ORAL
  Filled 2021-09-13 (×3): qty 60

## 2021-09-13 NOTE — ED Triage Notes (Signed)
Pt in with co nausea, dizziness and tremors since yesterday. Pt has a hx of liver cirrhosis and is on transplant list.  ?

## 2021-09-13 NOTE — ED Provider Notes (Signed)
? ?[facility] ?Provider Note ? ? ? Event Date/Time  ? First MD Initiated Contact with Patient 09/13/21 [phone]   ?  (approximate) ? ? ?History  ? ?Dizziness ? ? ?HPI ? ?Jesse Zimmerman is a 61 y.o. male who presents to the ED for evaluation of Dizziness ?  ?Review outpatient nephrology visit from 3/20.  History of [name] cirrhosis with ascites and esophageal varices, DM, HLD.  Chronic pain.  CKD 3.  Morbid obesity. ? ?Patient presents to the ED, accompanied by his wife, for evaluation of 2-3 days presyncopal dizziness, postprandial nausea and emesis and generalized weakness.  Reports explicit concern for anemia and needed blood transfusion.  Reports feeling somewhat tremulous to his bilateral hands, but reports feeling mentally sharp without confusion.  His wife reports concern for his ammonia level being elevated. ? ?On later reassessments when I obtain additional history, patient does report that he has been running low on his lactulose at home and has been spacing it out over the past week or so.  Then acknowledges he has not taken it since Thursday. ? ?Physical Exam  ? ?Triage Vital Signs: ?ED Triage Vitals  ?Enc Vitals Group  ?   BP 09/13/21 0859 (!) 132/49  ?   Pulse Rate 09/13/21 0859 (!) 55  ?   Resp 09/13/21 0859 20  ?   Temp 09/13/21 0859 97.6 ?F (36.4 ?C)  ?   Temp Source 09/13/21 0859 Oral  ?   SpO2 09/13/21 0859 100 %  ?   Weight 09/13/21 0858 (!) 320 lb (145.2 kg)  ?   Height 09/13/21 0858 6' (1.829 m)  ?   Head Circumference --   ?   Peak Flow --   ?   Pain Score 09/13/21 0857 5  ?   Pain Loc --   ?   Pain Edu? --   ?   Excl. in [place]? --   ? ? ?Most recent vital signs: ?Vitals:  ? 09/13/21 0859 09/13/21 1046  ?BP: (!) 132/49 135/77  ?Pulse: (!) 55 (!) 54  ?Resp: 20 15  ?Temp: 97.6 ?F (36.4 ?C) 98.5 ?F (36.9 ?C)  ?SpO2: 100% 100%  ? ? ?General: Awake, no distress.  Morbidly obese.  Semirecumbent, pleasant and conversational. ?Does have some asterixis bilaterally with arm elevation and  wrist extension ?CV:  Good peripheral perfusion.  ?Resp:  Normal effort.  ?Abd:  No distention.  Minimal and poorly localizing upper abdominal tenderness throughout the epigastrium, RUQ and LUQ.  Lower abdomen is benign.  No peritoneal features. ?MSK:  No deformity noted.  ?Neuro:  No focal deficits appreciated. ?Other:   ? ? ?ED Results / Procedures / Treatments  ? ?Labs ?(all labs ordered are listed, but only abnormal results are displayed) ?Labs Reviewed  ?CBC WITH DIFFERENTIAL/PLATELET - Abnormal; Notable for the following components:  ?    Result Value  ? RBC 2.27 (*)   ? Hemoglobin 7.8 (*)   ? HCT 23.3 (*)   ? MCV 102.6 (*)   ? MCH 34.4 (*)   ? Platelets 39 (*)   ? All other components within normal limits  ?COMPREHENSIVE METABOLIC PANEL - Abnormal; Notable for the following components:  ? Glucose, Bld 198 (*)   ? BUN 36 (*)   ? Creatinine, Ser 2.45 (*)   ? Calcium 8.4 (*)   ? Albumin 2.7 (*)   ? AST 46 (*)   ? Total Bilirubin 1.9 (*)   ? GFR,  Estimated 29 (*)   ? All other components within normal limits  ?URINALYSIS, ROUTINE W REFLEX MICROSCOPIC - Abnormal; Notable for the following components:  ? Color, Urine YELLOW (*)   ? APPearance CLEAR (*)   ? Glucose, UA 150 (*)   ? Hgb urine dipstick MODERATE (*)   ? All other components within normal limits  ?AMMONIA - Abnormal; Notable for the following components:  ? Ammonia 148 (*)   ? All other components within normal limits  ?LIPASE, BLOOD - Abnormal; Notable for the following components:  ? Lipase 175 (*)   ? All other components within normal limits  ?MAGNESIUM  ?APTT  ?PROTIME-INR  ?TYPE AND SCREEN  ? ? ?EKG ?Sinus rhythm, rate of 56 bpm.  Normal axis.  Incomplete left bundle.  Wandering baseline and no clear evidence of acute ischemia. ? ?RADIOLOGY ?RUQ ultrasound reviewed by me with gallstones with thick wall ? ?Official radiology report(s): ?US ABDOMEN LIMITED RUQ (LIVER/GB) ? ?Result Date: 09/13/2021 ?CLINICAL DATA:  Postprandial vomiting for 3 days.  EXAM: ULTRASOUND ABDOMEN LIMITED RIGHT UPPER QUADRANT COMPARISON:  Abdominal ultrasound dated 02/02/2021. FINDINGS: Gallbladder: Gallstones are seen in the gallbladder, measuring up to 9 mm. The gallbladder wall is thickened, measuring up to 7 mm. No sonographic Murphy sign noted by sonographer. Common bile duct: Diameter: 3 mm Liver: The liver has a nodular surface contour, heterogeneous parenchymal echotexture, and increased echogenicity. Hepatic cysts in the right lobe measure 1.8 x 1.4 x 1.7 cm and 0.8 x 0.5 x 0.6 cm. Portal vein is patent on color Doppler imaging with normal direction of blood flow towards the liver. Other: A cyst in the upper pole of the right kidney measures 3.8 x 3.8 x 4.1 cm. Ascites is visualized in the right upper quadrant, left upper quadrant, and left lower quadrant. IMPRESSION: 1. Hepatic cirrhosis and ascites. 2. Cholelithiasis. Gallbladder wall thickening may be secondary to cirrhosis. Electronically Signed   By: Zerita Boers M.D.   On: 09/13/2021 12:41   ? ?PROCEDURES and INTERVENTIONS: ? ?.1-3 Lead EKG Interpretation ?Performed by: Vladimir Crofts, MD ?Authorized by: Vladimir Crofts, MD  ? ?  Interpretation: normal   ?  ECG rate:  61 ?  ECG rate assessment: normal   ?  Rhythm: sinus rhythm   ?  Ectopy: none   ?  Conduction: normal   ? ?Medications  ?lactulose (CHRONULAC) 10 GM/15ML solution 30 g (has no administration in time range)  ?ondansetron (ZOFRAN) injection 4 mg (has no administration in time range)  ?albuterol (PROVENTIL) (2.5 MG/3ML) 0.083% nebulizer solution 2.5 mg (has no administration in time range)  ?ondansetron (ZOFRAN-ODT) disintegrating tablet 4 mg (4 mg Oral Given 09/13/21 0942)  ? ? ? ?IMPRESSION / MDM / ASSESSMENT AND PLAN / ED COURSE  ?I reviewed the triage vital signs and the nursing notes. ? ?61 year old male presents to the ED with tremulousness, dizziness and weakness in the setting of inconsistently taking his lactulose, with evidence of hyperammonemia,  requiring medical admission.  He has benign abdomen without evidence of SBP.  No evidence of focal neurologic or vascular deficits.  No signs of trauma.  Metabolic panel demonstrates worsening of his CKD and his CBC shows chronic macrocytic anemia without indications for transfusion.  Urine is noninfectious.  His ammonia is quite elevated to 150.  RUQ ultrasound obtained due to his postprandial emesis, and does not demonstrate clear signs of cholecystitis.  Thickened wall is noted, but his clinical syndrome is not consistent with cholecystitis.  Overall, suspect his noncompliance is contributing to hyperammonemia.  We will consult with medicine for admission. ? ?Clinical Course as of 09/13/21 1311  ?Sun Sep 13, 2021  ?1112 Reassessed. Discussed hyperammonemia [DS]  ?1310 Reassessed and discussed admission with the patient.  He is agreeable.  I consulted hospitalist who agrees to admit. [DS]  ?  ?Clinical Course User Index ?[DS] Vladimir Crofts, MD  ? ? ? ?FINAL CLINICAL IMPRESSION(S) / ED DIAGNOSES  ? ?Final diagnoses:  ?Hyperammonemia ([place])  ? ? ? ?Rx / DC Orders  ? ?ED Discharge Orders   ? ? None  ? ?  ? ? ? ?Note:  This document was prepared using Dragon voice recognition software and may include unintentional dictation errors. ?  ?Vladimir Crofts, MD ?09/13/21 1317 ? ?

## 2021-09-13 NOTE — H&P (Signed)
?History and Physical  ? ? ?Jesse Zimmerman BMW:[id number] DOB: June 05, 1961 DOA: 09/13/2021 ? ?Referring MD/NP/PA:  ? ?PCP: Jerrol Banana., MD  ? ?Patient coming from:  The patient is coming from home.  At baseline, pt is independent for most of ADL.       ? ?Chief Complaint: hand shaking, weakness ? ?HPI: Jesse Zimmerman is a 61 y.o. male with medical history significant of liver cirrhosis due to NASH, on transplant list in [facility], esophageal varices, portal hypertension, GI bleeding, thrombocytopenia, obesity with BMI 43.4, CKD-3B, hyperlipidemia, diabetes mellitus, who presents with hand shaking, weakness. ? ?Patient states that he ran out of his lactulose for several days.  He developed multiple symptoms, including hand shaking, weakness, dizziness, poor appetite.  He also has nausea and vomited few times with nonbilious nonbloody vomiting yesterday.  Currently patient has nausea, but no active vomiting.  No diarrhea or abdominal pain.  No symptoms of UTI.  Patient does not have chest pain, cough, shortness breath.  Patient is not confused.  Mental status normal.  He moves all extremities normally. ? ?Data Reviewed and ED Course: pt was found to have pneumonia 148, WBC 4.3, hemoglobin 7.8 (6.9 on 04/01/2021), negative urinalysis, lipase 175, temperature normal, blood pressure 132/49, heart rate 54, RR 15, oxygen saturation 100% on room air.  Patient is placed on MedSurg bed for observation.  ? ?US-RUQ: ?1. Hepatic cirrhosis and ascites. ?2. Cholelithiasis. Gallbladder wall thickening may be secondary to cirrhosis. ? ?EKG: I have personally reviewed.  Sinus rhythm, QTc 478, poor R wave progression, unstable EKG baseline. ? ? ?Review of Systems:  ? ?General: no fevers, chills, no body weight gain, has poor appetite, has fatigue ?HEENT: no blurry vision, hearing changes or sore throat ?Respiratory: no dyspnea, coughing, wheezing ?CV: no chest pain, no palpitations ?GI: has nausea, vomiting, no abdominal pain,  diarrhea, constipation ?GU: no dysuria, burning on urination, increased urinary frequency, hematuria  ?Ext: has leg edema ?Neuro: no unilateral weakness, numbness, or tingling, no vision change or hearing loss. Has hand shaking. ?Skin: no rash, no skin tear. ?MSK: No muscle spasm, no deformity, no limitation of range of movement in spin ?Heme: No easy bruising.  ?Travel history: No recent long distant travel. ? ? ?Allergy:  ?Allergies  ?Allergen Reactions  ? Perflutren Lipid Microspheres   ?  Other reaction(s): Other (See Comments) ?Back pain  ? Latex   ?  LATEX TAPE  ? Pineapple Hives  ? ? ?Past Medical History:  ?Diagnosis Date  ? Chronic back pain   ? Cirrhosis ([place])   ? NASH  ? Diabetes mellitus without complication ([place])   ? Esophageal varices ([facility])   ? Hyperlipidemia   ? NASH (nonalcoholic steatohepatitis)   ? ? ?Past Surgical History:  ?Procedure Laterality Date  ? COLONOSCOPY WITH PROPOFOL N/A 08/12/2017  ? Procedure: COLONOSCOPY WITH PROPOFOL;  Surgeon: Jonathon Bellows, MD;  Location: [facility] ENDOSCOPY;  Service: Gastroenterology;  Laterality: N/A;  ? ESOPHAGOGASTRODUODENOSCOPY (EGD) WITH PROPOFOL N/A 04/18/2017  ? Procedure: ESOPHAGOGASTRODUODENOSCOPY (EGD) WITH PROPOFOL;  Surgeon: [name], MD;  Location: [facility] ENDOSCOPY;  Service: Endoscopy;  Laterality: N/A;  ? ESOPHAGOGASTRODUODENOSCOPY (EGD) WITH PROPOFOL N/A 07/18/2018  ? Procedure: ESOPHAGOGASTRODUODENOSCOPY (EGD) WITH PROPOFOL;  Surgeon: [name], MD;  Location: [facility] ENDOSCOPY;  Service: Endoscopy;  Laterality: N/A;  ? EXCESSIVE THIGH / HIP / BUTTOCK / FLANK SKIN EXCISION    ? PART OF INNER THIGH/DUE TO SEPSIS INFECTION REMOVED  ? KIDNEY STONE SURGERY    ?  removed  ? TONSILLECTOMY AND ADENOIDECTOMY    ? ? ?Social History:  reports that he has never smoked. He has never used smokeless tobacco. He reports that he does not drink alcohol and does not use drugs. ? ?Family History:  ?Family History  ?Problem Relation Age of Onset  ? Hypertension Mother   ?  Breast cancer Mother   ? Dementia Mother   ? Heart attack Father   ? Gout Father   ? Hypertension Father   ? Hypertension Sister   ? Hypertension Sister   ? Colon cancer Maternal Aunt   ? Colon cancer Maternal Grandmother   ? Colon cancer Paternal Grandmother   ?  ? ?Prior to Admission medications   ?Medication Sig Start Date End Date Taking? Authorizing Provider  ?albuterol (VENTOLIN HFA) 108 (90 Base) MCG/ACT inhaler Inhale 2 puffs into the lungs every 6 (six) hours as needed for wheezing or shortness of breath. 05/12/21   Jerrol Banana., MD  ?blood glucose meter kit and supplies Dispense based on patient and insurance preference. Use once daily as directed. (FOR ICD-10 E11.69). 01/03/20   Jerrol Banana., MD  ?cetirizine (ZYRTEC) 10 MG tablet Take 1 tablet (10 mg total) by mouth daily. 10/22/20   Jerrol Banana., MD  ?Continuous Blood Gluc Receiver (FREESTYLE LIBRE 2 READER) DEVI 1 each by Does not apply route daily. 04/22/21   Jerrol Banana., MD  ?Continuous Blood Gluc Sensor (FREESTYLE LIBRE 2 SENSOR) MISC 1 each by Does not apply route daily. 04/22/21   Jerrol Banana., MD  ?Dulaglutide (TRULICITY) 2.95 MW/4.1LK SOPN Inject 0.75 mg into the skin once a week. 05/09/20   [provider]  ?furosemide (LASIX) 20 MG tablet Take 2 tablets (40 mg total) by mouth daily. 04/01/21 05/12/21  Gwynne Edinger, MD  ?JARDIANCE 25 MG TABS tablet TAKE 1 TABLET BY MOUTH  DAILY 05/27/21   Jerrol Banana., MD  ?loratadine (CLARITIN) 10 MG tablet Take 1 tablet (10 mg total) by mouth daily. 01/12/21   Jerrol Banana., MD  ?omeprazole (PRILOSEC) 20 MG capsule Take 1 capsule (20 mg total) by mouth daily. 02/16/21   Jerrol Banana., MD  ?ondansetron (ZOFRAN-ODT) 8 MG disintegrating tablet Take 1 tablet (8 mg total) by mouth every 8 (eight) hours as needed for nausea or vomiting. 04/23/21   Jerrol Banana., MD  ?propranolol (INDERAL) 20 MG tablet Take 1 tablet (20 mg  total) by mouth 2 (two) times daily. 01/31/20   Jerrol Banana., MD  ?spironolactone (ALDACTONE) 100 MG tablet TAKE ONE-HALF TABLET BY  MOUTH daily 04/01/21   Wouk, Ailene Rud, MD  ? ? ?Physical Exam: ?Vitals:  ? 09/13/21 0858 09/13/21 0859 09/13/21 1046  ?BP:  (!) 132/49 135/77  ?Pulse:  (!) 55 (!) 54  ?Resp:  20 15  ?Temp:  97.6 ?F (36.4 ?C) 98.5 ?F (36.9 ?C)  ?TempSrc:  Oral Oral  ?SpO2:  100% 100%  ?Weight: (!) 145.2 kg    ?Height: 6' (1.829 m)    ? ?General: Not in acute distress ?HEENT: ?      Eyes: PERRL, EOMI, no scleral icterus. ?      ENT: No discharge from the ears and nose, no pharynx injection, no tonsillar enlargement.  ?      Neck: No JVD, no bruit, no mass felt. ?Heme: No neck lymph node enlargement. ?Cardiac: S1/S2, RRR, No murmurs, No gallops or  rubs. ?Respiratory: No rales, wheezing, rhonchi or rubs. ?GI: Soft, mildly distended, nontender, no rebound pain, no organomegaly, BS present. ?GU: No hematuria ?Ext: 1+ pitting leg edema bilaterally. 1+DP/PT pulse bilaterally. ?Musculoskeletal: No joint deformities, No joint redness or warmth, no limitation of ROM in spin. ?Skin: No rashes.  ?Neuro: Alert, oriented X3, cranial nerves II-XII grossly intact, moves all extremities normally.  ?Psych: Patient is not psychotic, no suicidal or hemocidal ideation. ? ?Labs on Admission: I have personally reviewed following labs and imaging studies ? ?CBC: ?Recent Labs  ?Lab 09/13/21 ?0914  ?WBC 4.3  ?NEUTROABS 2.8  ?HGB 7.8*  ?HCT 23.3*  ?MCV 102.6*  ?PLT 39*  ? ?Basic Metabolic Panel: ?Recent Labs  ?Lab 09/13/21 ?0914  ?NA 137  ?K 4.4  ?CL 105  ?CO2 27  ?GLUCOSE 198*  ?BUN 36*  ?CREATININE 2.45*  ?CALCIUM 8.4*  ?MG 2.2  ? ?GFR: ?Estimated Creatinine Clearance: 47.4 mL/min (A) (by C-G formula based on SCr of 2.45 mg/dL (H)). ?Liver Function Tests: ?Recent Labs  ?Lab 09/13/21 ?0914  ?AST 46*  ?ALT 25  ?ALKPHOS 102  ?BILITOT 1.9*  ?PROT 6.9  ?ALBUMIN 2.7*  ? ?Recent Labs  ?Lab 09/13/21 ?0914  ?LIPASE 175*   ? ?Recent Labs  ?Lab 09/13/21 ?0914  ?AMMONIA 148*  ? ?Coagulation Profile: ?Recent Labs  ?Lab 09/13/21 ?1311  ?INR 1.7*  ? ?Cardiac Enzymes: ?No results for input(s): CKTOTAL, CKMB, CKMBINDEX, TROPONINI

## 2021-09-14 DIAGNOSIS — R7989 Other specified abnormal findings of blood chemistry: Secondary | ICD-10-CM | POA: Diagnosis not present

## 2021-09-14 LAB — CBC
HCT: 20.8 % — ABNORMAL LOW (ref 39.0–52.0)
Hemoglobin: 7 g/dL — ABNORMAL LOW (ref 13.0–17.0)
MCH: 34 pg (ref 26.0–34.0)
MCHC: 33.7 g/dL (ref 30.0–36.0)
MCV: 101 fL — ABNORMAL HIGH (ref 80.0–100.0)
Platelets: 33 10*3/uL — ABNORMAL LOW (ref 150–400)
RBC: 2.06 MIL/uL — ABNORMAL LOW (ref 4.22–5.81)
RDW: 14.5 % (ref 11.5–15.5)
WBC: 4.1 10*3/uL (ref 4.0–10.5)
nRBC: 0 % (ref 0.0–0.2)

## 2021-09-14 LAB — GLUCOSE, CAPILLARY: Glucose-Capillary: 111 mg/dL — ABNORMAL HIGH (ref 70–99)

## 2021-09-14 LAB — BASIC METABOLIC PANEL
Anion gap: 6 (ref 5–15)
BUN: 35 mg/dL — ABNORMAL HIGH (ref 6–20)
CO2: 24 mmol/L (ref 22–32)
Calcium: 8 mg/dL — ABNORMAL LOW (ref 8.9–10.3)
Chloride: 106 mmol/L (ref 98–111)
Creatinine, Ser: 2.46 mg/dL — ABNORMAL HIGH (ref 0.61–1.24)
GFR, Estimated: 29 mL/min — ABNORMAL LOW (ref >60)
Glucose, Bld: 114 mg/dL — ABNORMAL HIGH (ref 70–99)
Potassium: 4 mmol/L (ref 3.5–5.1)
Sodium: 136 mmol/L (ref 135–145)

## 2021-09-14 LAB — AMMONIA: Ammonia: 88 umol/L — ABNORMAL HIGH (ref 9–35)

## 2021-09-14 LAB — HIV ANTIBODY (ROUTINE TESTING W REFLEX): HIV Screen 4th Generation wRfx: NONREACTIVE

## 2021-09-14 MED ORDER — LACTULOSE 10 GM/15ML PO SOLN: 20.0000 g | Freq: Every day | ORAL | 3 refills | Status: DC

## 2021-09-14 MED ORDER — FUROSEMIDE 20 MG PO TABS
20.0000 mg | ORAL_TABLET | Freq: Three times a day (TID) | ORAL | Status: DC
Start: 2021-09-14 — End: 2021-09-14
  Administered 2021-09-14: 20 mg via ORAL
  Filled 2021-09-14: qty 1

## 2021-09-14 MED ORDER — FUROSEMIDE 20 MG PO TABS: 60.0000 mg | ORAL_TABLET | ORAL | 1 refills | Status: DC

## 2021-09-14 MED ORDER — SPIRONOLACTONE 25 MG PO TABS
50.0000 mg | ORAL_TABLET | Freq: Every day | ORAL | Status: DC
  Administered 2021-09-14: 50 mg via ORAL
  Filled 2021-09-14: qty 2

## 2021-09-14 MED ORDER — TRULICITY 0.75 MG/0.5ML ~~LOC~~ SOAJ: 0.7500 mg | SUBCUTANEOUS | 2 refills | Status: DC

## 2021-09-14 MED ORDER — ONDANSETRON 8 MG PO TBDP: 8.0000 mg | ORAL_TABLET | Freq: Three times a day (TID) | ORAL | 1 refills | Status: DC | PRN

## 2021-09-14 MED ORDER — EMPAGLIFLOZIN 25 MG PO TABS: 25.0000 mg | ORAL_TABLET | Freq: Every day | ORAL | 1 refills | Status: DC

## 2021-09-14 NOTE — Plan of Care (Signed)
Patient discharged home per MD orders at this time.All discharge instructions,education and medications reviewed with Pt and wife at the bedside.Pt expressed understanding and will comply with dc instructions.follow up appointments was also communicated to the patient.no verbal c/o or any ssx of distress.patient was discharged home with self-care per order.Pt was transported home by wife in a privately owned vehicle. ?

## 2021-09-14 NOTE — Discharge Instructions (Signed)
Patient will follow-up in the liver clinic at [facility], hematology, transplant clinic  at [facility] on his scheduled appointment. ? ?

## 2021-09-14 NOTE — Discharge Summary (Signed)
?Physician Discharge Summary ?  ?Patient: Jesse Zimmerman MRN: [id number] DOB: 30-Nov-1960  ?Admit date:     09/13/2021  ?Discharge date: 09/14/21  ?Discharge Physician: Fritzi Mandes  ? ?PCP: Jerrol Banana., MD  ? ?Recommendations at discharge:  ? ?follow-up with your primary care physician Dr. Rosanna Randy in 1 to 2 we ?patient advised to keep appointments with liver clinic, transplant clinic, hematology clinic at [facility] on the scheduled ? ?Discharge Diagnoses: ?Hand tremors suspected due to elevated ammonia in the setting of known cirrhosis of liver--improved ? ?Hospital Course: ?Jesse Zimmerman is a 61 y.o. male with medical history significant of liver cirrhosis due to NASH, on transplant list in [facility], esophageal varices, portal hypertension, GI bleeding, thrombocytopenia, obesity with BMI 43.4, CKD-3B, hyperlipidemia, diabetes mellitus, who presents with hand shaking, weakness. ?  ?Patient states that he ran out of his lactulose for several days.  He developed multiple symptoms, including hand shaking, weakness, dizziness, poor appetite. ? ?US-RUQ: ?1. Hepatic cirrhosis and ascites. ?2. Cholelithiasis. Gallbladder wall thickening may be secondary to cirrhosis. ? ?Elevated ammonia in the setting of known history of chronic cirrhosis of liver ?-- patient came in with hand tremors poor appetite and weakness. ?-- He had ran out of his lactulose for few days ?-- ammonia was 148-- 88 ?-- will continue lactulose 20 g daily ?-- mentation is normal. Hand tremors resolved. Patient requesting to go home. Lactulose prescribed. ? ?Thrombocytopenia ?-- chronic ?-- no active bleeding ? ?anemia of chronic disease-- macrocytic, iron deficient ?-- patient follows with hematology at [facility] He gets Retacrit shots bimonthly ?-- continue PO Iron ?-- no active bleeding reported. No indication for blood transfusion at present. Patient agreeable ? ?stage IIIB chronic kidney disease ?-- baseline creatinine 2.25 ?-- will resume spironolactone  and Lasix ?-- patient follows with [facility] nephrology ? ?type II diabetes with renal manifestations ?-- continue Jardiance and Trulicity at home ?-Sliding scale insulin ? ?overall hemodynamically stable. Will discharge patient to home. Discussed with patient and wife. Patient ambulated around nurses station few times. ?  ? ? ?  ? ? ?Consultants: none ?Procedures performed: none  ?Disposition: Home ?Diet recommendation:  ?Cardiac and Carb modified diet ?DISCHARGE MEDICATION: ?Allergies as of 09/14/2021   ? ?   Reactions  ? Perflutren Lipid Microspheres   ? Other reaction(s): Other (See Comments) ?Back pain  ? Latex   ? LATEX TAPE  ? Pineapple Hives  ? ?  ? ?  ?Medication List  ?  ? ?TAKE these medications   ? ?albuterol 108 (90 Base) MCG/ACT inhaler ?Commonly known as: VENTOLIN HFA ?Inhale 2 puffs into the lungs every 6 (six) hours as needed for wheezing or shortness of breath. ?  ?blood glucose meter kit and supplies ?Dispense based on patient and insurance preference. Use once daily as directed. (FOR ICD-10 E11.69). ?  ?cetirizine 10 MG tablet ?Commonly known as: ZYRTEC ?Take 1 tablet (10 mg total) by mouth daily. ?  ?empagliflozin 25 MG Tabs tablet ?Commonly known as: Jardiance ?Take 1 tablet (25 mg total) by mouth daily. ?What changed: how much to take ?  ?ferrous sulfate 325 (65 FE) MG EC tablet ?Take 1 tablet by mouth every morning. ?  ?FreeStyle Libre 2 Reader Kerrin Mo ?1 each by Does not apply route daily. ?  ?FreeStyle Libre 2 Sensor Misc ?1 each by Does not apply route daily. ?  ?furosemide 20 MG tablet ?Commonly known as: LASIX ?Take 3 tablets (60 mg total) by mouth as directed. Takes  40 mg in AM and 20 mg in evening ?  ?lactulose 10 GM/15ML solution ?Commonly known as: [place] ?Take 30 mLs (20 g total) by mouth daily. ?  ?loratadine 10 MG tablet ?Commonly known as: CLARITIN ?Take 1 tablet (10 mg total) by mouth daily. ?  ?omeprazole 20 MG capsule ?Commonly known as: PRILOSEC ?Take 1 capsule (20 mg total) by  mouth daily. ?  ?ondansetron 8 MG disintegrating tablet ?Commonly known as: ZOFRAN-ODT ?Take 1 tablet (8 mg total) by mouth every 8 (eight) hours as needed for nausea or vomiting. ?  ?propranolol 20 MG tablet ?Commonly known as: INDERAL ?Take 1 tablet (20 mg total) by mouth 2 (two) times daily. ?  ?spironolactone 100 MG tablet ?Commonly known as: ALDACTONE ?TAKE ONE-HALF TABLET BY  MOUTH daily ?  ?Trulicity 5.73 UK/0.2RK Sopn ?Generic drug: Dulaglutide ?Inject 0.75 mg into the skin once a week. ?  ? ?  ? ? Follow-up Information   ? ? Jerrol Banana., MD. Schedule an appointment as soon as possible for a visit in 1 week(s).   ?Specialty: Family Medicine ?Why: on your appt ?Contact information: ?[address] ?[address] ?[place] [address] ?[phone] ? ? ?  ?  ? ?  ?  ? ?  ? ?Discharge Exam: ?Filed Weights  ? 09/13/21 0858  ?Weight: (!) 145.2 kg  ? ? ? ?Condition at discharge: fair ? ?The results of significant diagnostics from this hospitalization (including imaging, microbiology, ancillary and laboratory) are listed below for reference.  ? ?Imaging Studies: ?US ABDOMEN LIMITED RUQ (LIVER/GB) ? ?Result Date: 09/13/2021 ?CLINICAL DATA:  Postprandial vomiting for 3 days. EXAM: ULTRASOUND ABDOMEN LIMITED RIGHT UPPER QUADRANT COMPARISON:  Abdominal ultrasound dated 02/02/2021. FINDINGS: Gallbladder: Gallstones are seen in the gallbladder, measuring up to 9 mm. The gallbladder wall is thickened, measuring up to 7 mm. No sonographic Murphy sign noted by sonographer. Common bile duct: Diameter: 3 mm Liver: The liver has a nodular surface contour, heterogeneous parenchymal echotexture, and increased echogenicity. Hepatic cysts in the right lobe measure 1.8 x 1.4 x 1.7 cm and 0.8 x 0.5 x 0.6 cm. Portal vein is patent on color Doppler imaging with normal direction of blood flow towards the liver. Other: A cyst in the upper pole of the right kidney measures 3.8 x 3.8 x 4.1 cm. Ascites is visualized in the right  upper quadrant, left upper quadrant, and left lower quadrant. IMPRESSION: 1. Hepatic cirrhosis and ascites. 2. Cholelithiasis. Gallbladder wall thickening may be secondary to cirrhosis. Electronically Signed   By: Zerita Boers M.D.   On: 09/13/2021 12:41   ? ?Microbiology: ?Results for orders placed or performed during the hospital encounter of 03/30/21  ?Resp Panel by RT-PCR (Flu A&B, Covid) Nasopharyngeal Swab     Status: Abnormal  ? Collection Time: 03/30/21 10:16 PM  ? Specimen: Nasopharyngeal Swab; Nasopharyngeal(NP) swabs in vial transport medium  ?Result Value Ref Range Status  ? SARS Coronavirus 2 by RT PCR POSITIVE (A) NEGATIVE Final  ?  Comment: RESULT CALLED TO, READ BACK BY AND VERIFIED WITH: ?Tiffany [email or web address]  10/11 22RH ?(NOTE) ?SARS-CoV-2 target nucleic acids are DETECTED. ? ?The SARS-CoV-2 RNA is generally detectable in upper respiratory ?specimens during the acute phase of infection. Positive results are ?indicative of the presence of the identified virus, but do not rule ?out bacterial infection or co-infection with other pathogens not ?detected by the test. Clinical correlation with patient history and ?other diagnostic information is necessary to determine patient ?infection status. The expected  result is Negative. ? ?Fact Sheet for Patients: ?EntrepreneurPulse.com.au ? ?Fact Sheet for Healthcare Providers: ?IncredibleEmployment.be ? ?This test is not yet approved or cleared by the [place] FDA and  ?has been authorized for detection and/or diagnosis of SARS-CoV-2 by ?FDA under an Emergency Use Authorization (EUA).  This EUA will ?remain in effect (meaning this test can be used ) for the duration of  ?the COVID-19 declaration under Section 564(b)(1) of the Act, 21 ?U.S.C. section 360bbb-3(b)(1), unless the authorization is ?terminated or revoked sooner. ? ?  ? Influenza A by PCR NEGATIVE NEGATIVE Final  ? Influenza B by PCR NEGATIVE NEGATIVE Final  ?   Comment: (NOTE) ?The Xpert Xpress SARS-CoV-2/FLU/RSV plus assay is intended as an aid ?in the diagnosis of influenza from Nasopharyngeal swab specimens and ?should not be used as a sole basis for treatment. Nas

## 2021-09-14 NOTE — Plan of Care (Signed)

## 2021-09-16 DIAGNOSIS — K7581 Nonalcoholic steatohepatitis (NASH): Secondary | ICD-10-CM | POA: Diagnosis not present

## 2021-09-16 DIAGNOSIS — K7460 Unspecified cirrhosis of liver: Secondary | ICD-10-CM | POA: Diagnosis not present

## 2021-09-16 DIAGNOSIS — K766 Portal hypertension: Secondary | ICD-10-CM | POA: Diagnosis not present

## 2021-09-16 DIAGNOSIS — N1831 Chronic kidney disease, stage 3a: Secondary | ICD-10-CM | POA: Diagnosis not present

## 2021-09-21 DIAGNOSIS — K7581 Nonalcoholic steatohepatitis (NASH): Secondary | ICD-10-CM | POA: Diagnosis not present

## 2021-09-21 DIAGNOSIS — K7460 Unspecified cirrhosis of liver: Secondary | ICD-10-CM | POA: Diagnosis not present

## 2021-09-23 DIAGNOSIS — D631 Anemia in chronic kidney disease: Secondary | ICD-10-CM | POA: Diagnosis not present

## 2021-09-23 DIAGNOSIS — N183 Chronic kidney disease, stage 3 unspecified: Secondary | ICD-10-CM | POA: Diagnosis not present

## 2021-09-24 DIAGNOSIS — G4733 Obstructive sleep apnea (adult) (pediatric): Secondary | ICD-10-CM | POA: Diagnosis not present

## 2021-09-25 DIAGNOSIS — G4733 Obstructive sleep apnea (adult) (pediatric): Secondary | ICD-10-CM | POA: Diagnosis not present

## 2021-09-28 DIAGNOSIS — Z01818 Encounter for other preprocedural examination: Secondary | ICD-10-CM | POA: Diagnosis not present

## 2021-10-01 ENCOUNTER — Ambulatory Visit (INDEPENDENT_AMBULATORY_CARE_PROVIDER_SITE_OTHER): Payer: BC Managed Care – PPO | Admitting: Family Medicine

## 2021-10-01 ENCOUNTER — Encounter: Payer: Self-pay | Admitting: Family Medicine

## 2021-10-01 VITALS — BP 138/47 | HR 51 | Temp 97.3°F | Resp 100 | Ht 72.0 in | Wt 318.0 lb

## 2021-10-01 DIAGNOSIS — N1832 Chronic kidney disease, stage 3b: Secondary | ICD-10-CM

## 2021-10-01 DIAGNOSIS — K7581 Nonalcoholic steatohepatitis (NASH): Secondary | ICD-10-CM

## 2021-10-01 DIAGNOSIS — E66813 Obesity, class 3: Secondary | ICD-10-CM

## 2021-10-01 DIAGNOSIS — D539 Nutritional anemia, unspecified: Secondary | ICD-10-CM

## 2021-10-01 DIAGNOSIS — E1121 Type 2 diabetes mellitus with diabetic nephropathy: Secondary | ICD-10-CM

## 2021-10-01 DIAGNOSIS — I8510 Secondary esophageal varices without bleeding: Secondary | ICD-10-CM | POA: Diagnosis not present

## 2021-10-01 DIAGNOSIS — G4733 Obstructive sleep apnea (adult) (pediatric): Secondary | ICD-10-CM

## 2021-10-01 DIAGNOSIS — D696 Thrombocytopenia, unspecified: Secondary | ICD-10-CM

## 2021-10-01 DIAGNOSIS — K7469 Other cirrhosis of liver: Secondary | ICD-10-CM

## 2021-10-01 DIAGNOSIS — Z01818 Encounter for other preprocedural examination: Secondary | ICD-10-CM

## 2021-10-01 DIAGNOSIS — K7460 Unspecified cirrhosis of liver: Secondary | ICD-10-CM

## 2021-10-01 DIAGNOSIS — H8109 Meniere's disease, unspecified ear: Secondary | ICD-10-CM

## 2021-10-01 MED ORDER — GABAPENTIN 100 MG PO CAPS: 100.0000 mg | ORAL_CAPSULE | Freq: Every day | ORAL | 3 refills | Status: DC

## 2021-10-01 MED ORDER — TRULICITY 1.5 MG/0.5ML ~~LOC~~ SOAJ: 1.5000 mg | SUBCUTANEOUS | 5 refills | Status: DC

## 2021-10-01 NOTE — Progress Notes (Signed)
?  ? ? ?Established patient visit ? ? ?Patient: Jesse Zimmerman   DOB: 1961/03/10   61 y.o. Male  MRN: [id number] ?Visit Date: 10/01/2021 ? ?Today's healthcare provider: Wilhemena Durie, MD  ? ?Chief Complaint  ?Patient presents with  ? Hospitalization Follow-up  ? ?Subjective  ?  ?HPI  ?Patient comes in today for follow-up.  He is trying to get on the transplant list for his liver at [facility]. ?He has now been on CPAP for 1 week at home and it is helping him some. ?States recently it felt like his legs did not work and he subsequently fell out of bed.  He has upper left arm ecchymosis from his fall head of the bed but has full range of motion in the arm.   ?For his transplant they have given him a goal weight of 290. ?Follow up Hospitalization ? ?Patient was admitted to [facility] on 09/13/2021 and discharged on 09/14/2021. ?He was treated for; Hand tremors suspected due to elevated ammonia in the setting of known cirrhosis of liver--improved. ?Treatment for this included; see notes in chart. ?Telephone follow up was done on; none ?He reports good compliance with treatment. ?He reports this condition is improved. ? ?----------------------------------------------------------------------------------------- - ? ? ?Medications: ?Outpatient Medications Prior to Visit  ?Medication Sig Note  ? albuterol (VENTOLIN HFA) 108 (90 Base) MCG/ACT inhaler Inhale 2 puffs into the lungs every 6 (six) hours as needed for wheezing or shortness of breath. (Patient not taking: Reported on 10/01/2021)   ? blood glucose meter kit and supplies Dispense based on patient and insurance preference. Use once daily as directed. (FOR ICD-10 E11.69).   ? cetirizine (ZYRTEC) 10 MG tablet Take 1 tablet (10 mg total) by mouth daily.   ? Continuous Blood Gluc Receiver (FREESTYLE LIBRE 2 READER) DEVI 1 each by Does not apply route daily.   ? Continuous Blood Gluc Sensor (FREESTYLE LIBRE 2 SENSOR) MISC 1 each by Does not apply route daily.   ? Dulaglutide  (TRULICITY) 6.07 PX/1.0GY SOPN Inject 0.75 mg into the skin once a week.   ? empagliflozin (JARDIANCE) 25 MG TABS tablet Take 1 tablet (25 mg total) by mouth daily.   ? ferrous sulfate 325 (65 FE) MG EC tablet Take 1 tablet by mouth every morning.   ? furosemide (LASIX) 20 MG tablet Take 3 tablets (60 mg total) by mouth as directed. Takes 40 mg in AM and 20 mg in evening   ? lactulose (CHRONULAC) 10 GM/15ML solution Take 30 mLs (20 g total) by mouth daily. 10/01/2021: Patient taking 60 mLs daily.   ? loratadine (CLARITIN) 10 MG tablet Take 1 tablet (10 mg total) by mouth daily.   ? omeprazole (PRILOSEC) 20 MG capsule Take 1 capsule (20 mg total) by mouth daily.   ? ondansetron (ZOFRAN-ODT) 8 MG disintegrating tablet Take 1 tablet (8 mg total) by mouth every 8 (eight) hours as needed for nausea or vomiting.   ? propranolol (INDERAL) 20 MG tablet Take 1 tablet (20 mg total) by mouth 2 (two) times daily.   ? spironolactone (ALDACTONE) 100 MG tablet TAKE ONE-HALF TABLET BY  MOUTH daily 10/01/2021: Patient reported taking 50 mg daily  ? ?No facility-administered medications prior to visit.  ? ? ?Review of Systems  ?Constitutional:  Negative for appetite change, chills and fever.  ?Respiratory:  Negative for chest tightness, shortness of breath and wheezing.   ?Cardiovascular:  Negative for chest pain and palpitations.  ?Gastrointestinal:  Negative for abdominal pain,  nausea and vomiting.  ? ?Last hemoglobin A1c ?Lab Results  ?Component Value Date  ? HGBA1C 5.0 02/16/2021  ? ?  ?  Objective  ?  ?BP (!) 138/47 (BP Location: Right Arm, Patient Position: Sitting, Cuff Size: Large)   Pulse (!) 51   Temp (!) 97.3 ?F (36.3 ?C) (Oral)   Resp (!) 100   Ht 6' (1.829 m)   Wt (!) 318 lb (144.2 kg)   BMI 43.13 kg/m?  ?BP Readings from Last 3 Encounters:  ?10/01/21 (!) 138/47  ?09/14/21 (!) 124/59  ?08/27/21 118/60  ? ?Wt Readings from Last 3 Encounters:  ?10/01/21 (!) 318 lb (144.2 kg)  ?09/13/21 (!) 320 lb (145.2 kg)  ?08/27/21  (!) 329 lb (149.2 kg)  ? ?  ? ?Physical Exam ?Vitals reviewed.  ?Constitutional:   ?   Appearance: Normal appearance. He is obese.  ?HENT:  ?   Head: Normocephalic and atraumatic.  ?   Right Ear: External ear normal.  ?   Left Ear: External ear normal.  ?   Nose: Nose normal.  ?   Mouth/Throat:  ?   Pharynx: Oropharynx is clear.  ?Eyes:  ?   General: No scleral icterus. ?   Conjunctiva/sclera: Conjunctivae normal.  ?Neck:  ?   Comments: No JVD noted. ?Cardiovascular:  ?   Rate and Rhythm: Normal rate and regular rhythm.  ?   Pulses: Normal pulses.  ?   Heart sounds: Normal heart sounds.  ?Pulmonary:  ?   Effort: Pulmonary effort is normal.  ?   Comments: Decreased breath sounds in the left base ?Abdominal:  ?   General: There is no distension.  ?   Palpations: Abdomen is soft.  ?   Tenderness: There is no abdominal tenderness.  ?Musculoskeletal:  ?   Comments: 1+  edema.  ?Skin: ?   General: Skin is warm and dry.  ?Neurological:  ?   General: No focal deficit present.  ?   Mental Status: He is alert and oriented to person, place, and time. Mental status is at baseline.  ?Psychiatric:     ?   Mood and Affect: Mood normal.     ?   Behavior: Behavior normal.     ?   Thought Content: Thought content normal.     ?   Judgment: Judgment normal.  ?  ? ? ?No results found for any visits on 10/01/21. ? Assessment & Plan  ?  ? ?1. Liver cirrhosis secondary to NASH (nonalcoholic steatohepatitis) ([place]) ?General liver transplant list at [facility] ? ?2. NASH (nonalcoholic steatohepatitis) ? ? ?3. Secondary esophageal varices without bleeding ([place]) ? ? ?4. Type 2 diabetes mellitus with diabetic nephropathy, without long-term current use of insulin ([place]) ?Controlled with weight loss recently.  Continue present medications to continue to help with weight loss.  Also to protect his kidneys.  On Jardiance  and Trulicity ? ?5. Macrocytic anemia ? ? ?6. Pre-transplant evaluation for liver transplant ?Multiple appointments over the next couple  of weeks due to ? ?7. Stage 3b chronic kidney disease (CKD) ([place]) ? ? ?8. OSA (obstructive sleep apnea) ?On CPAP now ? ?9. Thrombocytopenia ([place]) ?Due to cirrhosis ? ?10. Obesity, Class III, BMI 40-49.9 (morbid obesity) ([place]) ?Weight of less than 260 ? ?11. Meniere's disease, unspecified laterality ? ? ? ?No follow-ups on file.  ?   ? ?I, Wilhemena Durie, MD, have reviewed all documentation for this visit. The documentation on 10/09/21 for the exam, diagnosis,  procedures, and orders are all accurate and complete. ? ? ? ?Malaina Mortellaro Cranford Mon, MD  ?[facility] ?[phone] (phone) ?[phone] (fax) ? ?[facility] ?

## 2021-10-06 DIAGNOSIS — Z01818 Encounter for other preprocedural examination: Secondary | ICD-10-CM | POA: Diagnosis not present

## 2021-10-07 DIAGNOSIS — K7460 Unspecified cirrhosis of liver: Secondary | ICD-10-CM | POA: Diagnosis not present

## 2021-10-07 DIAGNOSIS — K766 Portal hypertension: Secondary | ICD-10-CM | POA: Diagnosis not present

## 2021-10-07 DIAGNOSIS — K7581 Nonalcoholic steatohepatitis (NASH): Secondary | ICD-10-CM | POA: Diagnosis not present

## 2021-10-07 DIAGNOSIS — N183 Chronic kidney disease, stage 3 unspecified: Secondary | ICD-10-CM | POA: Diagnosis not present

## 2021-10-07 DIAGNOSIS — D631 Anemia in chronic kidney disease: Secondary | ICD-10-CM | POA: Diagnosis not present

## 2021-10-07 DIAGNOSIS — Z01818 Encounter for other preprocedural examination: Secondary | ICD-10-CM | POA: Diagnosis not present

## 2021-10-09 DIAGNOSIS — K7581 Nonalcoholic steatohepatitis (NASH): Secondary | ICD-10-CM | POA: Diagnosis not present

## 2021-10-09 DIAGNOSIS — K7460 Unspecified cirrhosis of liver: Secondary | ICD-10-CM | POA: Diagnosis not present

## 2021-10-09 DIAGNOSIS — Z01818 Encounter for other preprocedural examination: Secondary | ICD-10-CM | POA: Diagnosis not present

## 2021-10-14 DIAGNOSIS — F411 Generalized anxiety disorder: Secondary | ICD-10-CM | POA: Diagnosis not present

## 2021-10-14 DIAGNOSIS — K7581 Nonalcoholic steatohepatitis (NASH): Secondary | ICD-10-CM | POA: Diagnosis not present

## 2021-10-14 DIAGNOSIS — Z008 Encounter for other general examination: Secondary | ICD-10-CM | POA: Diagnosis not present

## 2021-10-14 DIAGNOSIS — F419 Anxiety disorder, unspecified: Secondary | ICD-10-CM | POA: Diagnosis not present

## 2021-10-14 DIAGNOSIS — Z01818 Encounter for other preprocedural examination: Secondary | ICD-10-CM | POA: Diagnosis not present

## 2021-10-14 DIAGNOSIS — Z7682 Awaiting organ transplant status: Secondary | ICD-10-CM | POA: Diagnosis not present

## 2021-10-14 DIAGNOSIS — F54 Psychological and behavioral factors associated with disorders or diseases classified elsewhere: Secondary | ICD-10-CM | POA: Diagnosis not present

## 2021-10-18 ENCOUNTER — Other Ambulatory Visit: Payer: Self-pay | Admitting: Family Medicine

## 2021-10-20 DIAGNOSIS — K7460 Unspecified cirrhosis of liver: Secondary | ICD-10-CM | POA: Diagnosis not present

## 2021-10-20 DIAGNOSIS — K7581 Nonalcoholic steatohepatitis (NASH): Secondary | ICD-10-CM | POA: Diagnosis not present

## 2021-10-21 DIAGNOSIS — N183 Chronic kidney disease, stage 3 unspecified: Secondary | ICD-10-CM | POA: Diagnosis not present

## 2021-10-21 DIAGNOSIS — D631 Anemia in chronic kidney disease: Secondary | ICD-10-CM | POA: Diagnosis not present

## 2021-10-22 DIAGNOSIS — R531 Weakness: Secondary | ICD-10-CM | POA: Diagnosis not present

## 2021-10-24 DIAGNOSIS — G4733 Obstructive sleep apnea (adult) (pediatric): Secondary | ICD-10-CM | POA: Diagnosis not present

## 2021-10-25 DIAGNOSIS — G4733 Obstructive sleep apnea (adult) (pediatric): Secondary | ICD-10-CM | POA: Diagnosis not present

## 2021-10-30 DIAGNOSIS — Z713 Dietary counseling and surveillance: Secondary | ICD-10-CM | POA: Diagnosis not present

## 2021-10-30 DIAGNOSIS — Z6841 Body Mass Index (BMI) 40.0 and over, adult: Secondary | ICD-10-CM | POA: Diagnosis not present

## 2021-11-02 ENCOUNTER — Other Ambulatory Visit: Payer: Self-pay | Admitting: Family Medicine

## 2021-11-02 DIAGNOSIS — E669 Obesity, unspecified: Secondary | ICD-10-CM

## 2021-11-04 ENCOUNTER — Other Ambulatory Visit: Payer: Self-pay | Admitting: Family Medicine

## 2021-11-04 DIAGNOSIS — F419 Anxiety disorder, unspecified: Secondary | ICD-10-CM

## 2021-11-04 DIAGNOSIS — N183 Chronic kidney disease, stage 3 unspecified: Secondary | ICD-10-CM | POA: Diagnosis not present

## 2021-11-04 DIAGNOSIS — D631 Anemia in chronic kidney disease: Secondary | ICD-10-CM | POA: Diagnosis not present

## 2021-11-04 NOTE — Telephone Encounter (Signed)
Requested medication (s) are due for refill today -no ? ?Requested medication (s) are on the active medication list -yes ? ?Future visit scheduled -yes ? ?Last refill: 09/14/21 #90 1RF ? ?Notes to clinic: Request RF: outside provider ? ?Requested Prescriptions  ?Pending Prescriptions Disp Refills  ? JARDIANCE 25 MG TABS tablet [Pharmacy Med Name: Jardiance 25 MG Oral Tablet] 90 tablet 3  ?  Sig: TAKE 1 TABLET BY MOUTH DAILY  ?  ? Endocrinology:  Diabetes - SGLT2 Inhibitors Failed - 11/02/2021 10:34 PM  ?  ?  Failed - Cr in normal range and within 360 days  ?  Creatinine  ?Date Value Ref Range Status  ?06/11/2013 1.14 0.60 - 1.30 mg/dL Final  ? ?Creatinine, Ser  ?Date Value Ref Range Status  ?09/14/2021 2.46 (H) 0.61 - 1.24 mg/dL Final  ?   ?  ?  Failed - HBA1C is between 0 and 7.9 and within 180 days  ?  Hemoglobin A1C  ?Date Value Ref Range Status  ?02/16/2021 5.0 4.0 - 5.6 % Final  ? ?Hgb A1c MFr Bld  ?Date Value Ref Range Status  ?10/27/2020 7.3 (H) 4.8 - 5.6 % Final  ?  Comment:  ?           Prediabetes: 5.7 - 6.4 ?         Diabetes: >6.4 ?         Glycemic control for adults with diabetes: <7.0 ?  ?   ?  ?  Failed - eGFR in normal range and within 360 days  ?  EGFR (African American)  ?Date Value Ref Range Status  ?06/11/2013 >60  Final  ? ?GFR calc Af Wyvonnia Lora  ?Date Value Ref Range Status  ?02/13/2020 68 >59 mL/min/1.73 Final  ?  Comment:  ?  **[facility] currently reports eGFR in compliance with the current** ?  recommendations of the [employer]. [facility] will ?  update reporting as new guidelines are published from the NKF-ASN ?  Task force. ?  ? ?EGFR (Non-African Amer.)  ?Date Value Ref Range Status  ?06/11/2013 >60  Final  ?  Comment:  ?  eGFR values <73m/min/1.73 m2 may be an indication of chronic ?kidney disease (CKD). ?Calculated eGFR is useful in patients with stable renal function. ?The eGFR calculation will not be reliable in acutely ill patients ?when serum creatinine is changing rapidly.  It is not useful in  ?patients on dialysis. The eGFR calculation may not be applicable ?to patients at the low and high extremes of body sizes, pregnant ?women, and vegetarians. ?  ? ?GFR, Estimated  ?Date Value Ref Range Status  ?09/14/2021 29 (L) >60 mL/min Final  ?  Comment:  ?  (NOTE) ?Calculated using the CKD-EPI Creatinine Equation (2021) ?  ? ?eGFR  ?Date Value Ref Range Status  ?02/16/2021 51 (L) >59 mL/min/1.73 Final  ?   ?  ?  Passed - Valid encounter within last 6 months  ?  Recent Outpatient Visits   ? ?      ? 1 month ago Liver cirrhosis secondary to NASH ([place]onalcoholic steatohepatitis) ([place]  [facility]  ? 5 months ago [facility] ([facility]  ? 6 months ago [facility], unspecified type  ? [facility]  ? 8 months ago Diabetes mellitus type 2 in obese ([facility]  ? [facility]  Kaylyn Lim., MD  ? 9 months ago Cirrhosis of liver with ascites, unspecified hepatic cirrhosis type ([place])  ? [facility] Jerrol Banana., MD  ? ?  ?  ?Future Appointments   ? ?        ? In 4 weeks Jerrol Banana., MD [facility], [facility]  ? In 2 months Chesley Mires, MD [facility] [place]  ? ?  ? ? ?  ?  ?  ? ? ? ?Requested Prescriptions  ?Pending Prescriptions Disp Refills  ? JARDIANCE 25 MG TABS tablet [Pharmacy Med Name: Jardiance 25 MG Oral Tablet] 90 tablet 3  ?  Sig: TAKE 1 TABLET BY MOUTH DAILY  ?  ? Endocrinology:  Diabetes - SGLT2 Inhibitors Failed - 11/02/2021 10:34 PM  ?  ?  Failed - Cr in normal range and within 360 days  ?  Creatinine  ?Date Value Ref Range Status  ?06/11/2013 1.14 0.60 - 1.30 mg/dL Final  ? ?Creatinine, Ser  ?Date Value Ref Range Status  ?09/14/2021 2.46 (H) 0.61 - 1.24 mg/dL Final  ?   ?  ?  Failed - HBA1C is between 0 and 7.9 and within 180 days  ?  Hemoglobin A1C  ?Date Value Ref  Range Status  ?02/16/2021 5.0 4.0 - 5.6 % Final  ? ?Hgb A1c MFr Bld  ?Date Value Ref Range Status  ?10/27/2020 7.3 (H) 4.8 - 5.6 % Final  ?  Comment:  ?           Prediabetes: 5.7 - 6.4 ?         Diabetes: >6.4 ?         Glycemic control for adults with diabetes: <7.0 ?  ?   ?  ?  Failed - eGFR in normal range and within 360 days  ?  EGFR (African American)  ?Date Value Ref Range Status  ?06/11/2013 >60  Final  ? ?GFR calc Af Wyvonnia Lora  ?Date Value Ref Range Status  ?02/13/2020 68 >59 mL/min/1.73 Final  ?  Comment:  ?  **[facility] currently reports eGFR in compliance with the current** ?  recommendations of the [employer]. [facility] will ?  update reporting as new guidelines are published from the NKF-ASN ?  Task force. ?  ? ?EGFR (Non-African Amer.)  ?Date Value Ref Range Status  ?06/11/2013 >60  Final  ?  Comment:  ?  eGFR values <32m/min/1.73 m2 may be an indication of chronic ?kidney disease (CKD). ?Calculated eGFR is useful in patients with stable renal function. ?The eGFR calculation will not be reliable in acutely ill patients ?when serum creatinine is changing rapidly. It is not useful in  ?patients on dialysis. The eGFR calculation may not be applicable ?to patients at the low and high extremes of body sizes, pregnant ?women, and vegetarians. ?  ? ?GFR, Estimated  ?Date Value Ref Range Status  ?09/14/2021 29 (L) >60 mL/min Final  ?  Comment:  ?  (NOTE) ?Calculated using the CKD-EPI Creatinine Equation (2021) ?  ? ?eGFR  ?Date Value Ref Range Status  ?02/16/2021 51 (L) >59 mL/min/1.73 Final  ?   ?  ?  Passed - Valid encounter within last 6 months  ?  Recent Outpatient Visits   ? ?      ? 1 month ago Liver cirrhosis secondary to NASH ([place]onalcoholic steatohepatitis) ([place]  ? [facility]  ? 5 months ago Decompensated hepatic cirrhosis ([facility]  ?  [facility] Jerrol Banana., MD  ? 6 months ago Anemia, unspecified type  ? [facility] Jerrol Banana., MD  ? 8 months ago Diabetes mellitus type 2 in obese [facility])  ? [facility] Jerrol Banana., MD  ? 9 months ago Cirrhosis of liver with ascites, unspecified hepatic cirrhosis type [facility])  ? [facility] Jerrol Banana., MD  ? ?  ?  ?Future Appointments   ? ?        ? In 4 weeks Jerrol Banana., MD [facility]  ? In 2 months Chesley Mires, MD [facility] [place]  ? ?  ? ? ?  ?  ?  ? ? ? ?

## 2021-11-09 DIAGNOSIS — K7581 Nonalcoholic steatohepatitis (NASH): Secondary | ICD-10-CM | POA: Diagnosis not present

## 2021-11-09 DIAGNOSIS — R188 Other ascites: Secondary | ICD-10-CM | POA: Diagnosis not present

## 2021-11-09 DIAGNOSIS — K766 Portal hypertension: Secondary | ICD-10-CM | POA: Diagnosis not present

## 2021-11-09 DIAGNOSIS — K7682 Hepatic encephalopathy: Secondary | ICD-10-CM | POA: Diagnosis not present

## 2021-11-13 ENCOUNTER — Emergency Department
Admission: EM | Admit: 2021-11-13 | Discharge: 2021-11-13 | Disposition: A | Discharge status: Home / Self Care | Payer: BC Managed Care – PPO | Attending: Emergency Medicine | Admitting: Emergency Medicine

## 2021-11-13 ENCOUNTER — Ambulatory Visit: Payer: Self-pay

## 2021-11-13 DIAGNOSIS — R944 Abnormal results of kidney function studies: Secondary | ICD-10-CM | POA: Diagnosis not present

## 2021-11-13 DIAGNOSIS — R197 Diarrhea, unspecified: Secondary | ICD-10-CM | POA: Diagnosis not present

## 2021-11-13 DIAGNOSIS — R748 Abnormal levels of other serum enzymes: Secondary | ICD-10-CM | POA: Insufficient documentation

## 2021-11-13 DIAGNOSIS — E119 Type 2 diabetes mellitus without complications: Secondary | ICD-10-CM | POA: Diagnosis not present

## 2021-11-13 DIAGNOSIS — R112 Nausea with vomiting, unspecified: Secondary | ICD-10-CM

## 2021-11-13 DIAGNOSIS — D696 Thrombocytopenia, unspecified: Secondary | ICD-10-CM | POA: Insufficient documentation

## 2021-11-13 DIAGNOSIS — I10 Essential (primary) hypertension: Secondary | ICD-10-CM | POA: Diagnosis not present

## 2021-11-13 LAB — CBC
HCT: 24.7 % — ABNORMAL LOW (ref 39.0–52.0)
Hemoglobin: 8.4 g/dL — ABNORMAL LOW (ref 13.0–17.0)
MCH: 34.1 pg — ABNORMAL HIGH (ref 26.0–34.0)
MCHC: 34 g/dL (ref 30.0–36.0)
MCV: 100.4 fL — ABNORMAL HIGH (ref 80.0–100.0)
Platelets: 35 10*3/uL — ABNORMAL LOW (ref 150–400)
RBC: 2.46 MIL/uL — ABNORMAL LOW (ref 4.22–5.81)
RDW: 15.5 % (ref 11.5–15.5)
WBC: 4.4 10*3/uL (ref 4.0–10.5)
nRBC: 0 % (ref 0.0–0.2)

## 2021-11-13 LAB — COMPREHENSIVE METABOLIC PANEL
ALT: 20 U/L (ref 0–44)
AST: 43 U/L — ABNORMAL HIGH (ref 15–41)
Albumin: 2.6 g/dL — ABNORMAL LOW (ref 3.5–5.0)
Alkaline Phosphatase: 75 U/L (ref 38–126)
Anion gap: 4 — ABNORMAL LOW (ref 5–15)
BUN: 28 mg/dL — ABNORMAL HIGH (ref 6–20)
CO2: 25 mmol/L (ref 22–32)
Calcium: 8.2 mg/dL — ABNORMAL LOW (ref 8.9–10.3)
Chloride: 106 mmol/L (ref 98–111)
Creatinine, Ser: 2.2 mg/dL — ABNORMAL HIGH (ref 0.61–1.24)
GFR, Estimated: 33 mL/min — ABNORMAL LOW (ref >60)
Glucose, Bld: 142 mg/dL — ABNORMAL HIGH (ref 70–99)
Potassium: 4.6 mmol/L (ref 3.5–5.1)
Sodium: 135 mmol/L (ref 135–145)
Total Bilirubin: 3.3 mg/dL — ABNORMAL HIGH (ref 0.3–1.2)
Total Protein: 6.8 g/dL (ref 6.5–8.1)

## 2021-11-13 LAB — AMMONIA: Ammonia: 54 umol/L — ABNORMAL HIGH (ref 9–35)

## 2021-11-13 LAB — MAGNESIUM: Magnesium: 2.2 mg/dL (ref 1.7–2.4)

## 2021-11-13 LAB — LIPASE, BLOOD: Lipase: 93 U/L — ABNORMAL HIGH (ref 11–51)

## 2021-11-13 MED ORDER — SODIUM CHLORIDE 0.9 % IV BOLUS: 1000.0000 mL | Freq: Once | INTRAVENOUS | Status: DC

## 2021-11-13 MED ORDER — ONDANSETRON 4 MG PO TBDP: 4.0000 mg | ORAL_TABLET | Freq: Three times a day (TID) | ORAL | 0 refills | Status: DC | PRN

## 2021-11-13 MED ORDER — SODIUM CHLORIDE 0.9 % IV BOLUS
500.0000 mL | Freq: Once | INTRAVENOUS | Status: AC
  Administered 2021-11-13: 500 mL via INTRAVENOUS

## 2021-11-13 MED ORDER — ONDANSETRON HCL 4 MG/2ML IJ SOLN
4.0000 mg | Freq: Once | INTRAMUSCULAR | Status: AC
  Administered 2021-11-13: 4 mg via INTRAVENOUS
  Filled 2021-11-13: qty 2

## 2021-11-13 NOTE — ED Triage Notes (Signed)
Pt from home via EMS with reports of Nausea and vomiting x 48hrs. Denies diarrhea. Pt also reports generalized weakness. Was placed on transplant list today for liver. Pt has cirrhosis of liver.   EMS- 20 L FA  NS  74m Zofran

## 2021-11-13 NOTE — Telephone Encounter (Signed)
FYI

## 2021-11-13 NOTE — Telephone Encounter (Signed)
Wife Jesse Zimmerman called office and stated pt has been vomiting last night and this am. Jesse Zimmerman stated she believes he is dehydrated. Pt has esophageal varices that have ruptured in the pt. Jesse Zimmerman to call 911 for pt. Jesse Zimmerman verbalized understanding. Pt in liver failure and waiting for  transplant.  Answer Assessment - Initial Assessment Questions 1. VOMITING SEVERITY: "How many times have you vomited in the past 24 hours?"     - MILD:  1 - 2 times/day    - MODERATE: 3 - 5 times/day, decreased oral intake without significant weight loss or symptoms of dehydration    - SEVERE: 6 or more times/day, vomits everything or nearly everything, with significant weight loss, symptoms of dehydration      severe 2. ONSET: "When did the vomiting begin?"      Last night 3. FLUIDS: "What fluids or food have you vomited up today?" "Have you been able to keep any fluids down?"     Ended triage advised to call 911  4. ABDOMINAL PAIN: "Are your having any abdominal pain?" If yes : "How bad is it and what does it feel like?" (e.g., crampy, dull, intermittent, constant)        Ended triage due to pt condition. In liver failure 5. DIARRHEA: "Is there any diarrhea?" If Yes, ask: "How many times today?"        Ended triage due to pt condition. 6. CONTACTS: "Is there anyone else in the family with the same symptoms?"        Ended triage due to pt condition. 7. CAUSE: "What do you think is causing your vomiting?"       Ended triage due to pt condition. 8. HYDRATION STATUS: "Any signs of dehydration?" (e.g., dry mouth [not only dry lips], too weak to stand) "When did you last urinate?"     Wife said pt is dehydrated 9. OTHER SYMPTOMS: "Do you have any other symptoms?" (e.g., fever, headache, vertigo, vomiting blood or coffee grounds, recent head injury)     Ended triage due to pt condition.  Protocols used: Vomiting-A-AH

## 2021-11-13 NOTE — Discharge Instructions (Addendum)
Return to the ER for new, worsening, or persistent severe vomiting, abdominal pain, fever, blood in your vomit or in the stool, or any other new or worsening symptoms that concern you.  Follow-up with your regular doctor.

## 2021-11-13 NOTE — ED Provider Notes (Addendum)
[facility] Provider Note    Event Date/Time   First MD Initiated Contact with Patient 11/13/21 1504     (approximate)   History   Emesis   HPI  Jesse Zimmerman is a 61 y.o. male with a history of [name] cirrhosis, esophageal varices and GI bleed, portal hypertension, thrombocytopenia, hyperlipidemia, diabetes, who presents with nausea and vomiting over the last 2 days, somewhat improved now after receiving Zofran from EMS.  The patient reports some intermittent left-sided abdominal pain which is not present currently, and states that he did have diarrhea today.  He denies any blood in the vomitus and has had no frank blood or melena in the stool.  He denies fever or chills.  He has no sick contacts.  The patient states that he feels somewhat lightheaded and weak and thinks he is dehydrated.   Physical Exam   Triage Vital Signs: ED Triage Vitals  Enc Vitals Group     BP 11/13/21 1150 (!) 130/49     Pulse Rate 11/13/21 1150 (!) 52     Resp 11/13/21 1150 14     Temp 11/13/21 1150 97.6 F (36.4 C)     Temp Source 11/13/21 1150 Oral     SpO2 11/13/21 1150 100 %     Weight 11/13/21 1150 (!) 320 lb (145.2 kg)     Height 11/13/21 1150 6' (1.829 m)     Head Circumference --      Peak Flow --      Pain Score 11/13/21 1154 0     Pain Loc --      Pain Edu? --      Excl. in [place]? --     Most recent vital signs: Vitals:   11/13/21 1600 11/13/21 1630  BP: (!) 148/65 (!) 141/67  Pulse: (!) 58 (!) 56  Resp: 15 16  Temp:    SpO2: 100% 100%     General: Awake, no distress.  CV:  Good peripheral perfusion.  Resp:  Normal effort.  Abd:  No distention.  Soft with no focal tenderness. Other:  Dry mucous membranes.  No scleral icterus.   ED Results / Procedures / Treatments   Labs (all labs ordered are listed, but only abnormal results are displayed) Labs Reviewed  LIPASE, BLOOD - Abnormal; Notable for the following components:      Result Value    Lipase 93 (*)    All other components within normal limits  COMPREHENSIVE METABOLIC PANEL - Abnormal; Notable for the following components:   Glucose, Bld 142 (*)    BUN 28 (*)    Creatinine, Ser 2.20 (*)    Calcium 8.2 (*)    Albumin 2.6 (*)    AST 43 (*)    Total Bilirubin 3.3 (*)    GFR, Estimated 33 (*)    Anion gap 4 (*)    All other components within normal limits  CBC - Abnormal; Notable for the following components:   RBC 2.46 (*)    Hemoglobin 8.4 (*)    HCT 24.7 (*)    MCV 100.4 (*)    MCH 34.1 (*)    Platelets 35 (*)    All other components within normal limits  AMMONIA - Abnormal; Notable for the following components:   Ammonia 54 (*)    All other components within normal limits  MAGNESIUM     EKG   RADIOLOGY   PROCEDURES:  Critical Care performed: No  Procedures  MEDICATIONS ORDERED IN ED: Medications  ondansetron (ZOFRAN) injection 4 mg (4 mg Intravenous Given 11/13/21 1533)  sodium chloride 0.9 % bolus 500 mL (0 mLs Intravenous Stopped 11/13/21 1700)     IMPRESSION / MDM / ASSESSMENT AND PLAN / ED COURSE  I reviewed the triage vital signs and the nursing notes.  60 year old male with PMH as noted above presents with nausea and vomiting over the last 2 days with some associated mild abdominal discomfort and diarrhea.  I reviewed the past medical records.  The patient follows with the liver center at [facility] and per the records was placed on the transplant list today.  On exam the patient is overall comfortable appearing.  His vital signs are normal.  The abdomen is soft with no focal tenderness.  Mucous membranes are somewhat dry.  Differential diagnosis includes, but is not limited to, gastroenteritis, viral syndrome, gastritis, gastroparesis, foodborne illness.  The patient has no evidence of active GI bleeding at this time although he is at elevated risk given his past history.  He has no sustained abdominal pain or any tenderness on exam; there  is no evidence for colitis, diverticulitis, appendicitis, volvulus, or other surgical etiology.  Patient's presentation is most consistent with acute presentation with potential threat to life or bodily function.  Initial lab work-up is overall reassuring.  The patient is anemic with hemoglobin consistent with prior; thrombocytopenia is also unchanged from prior.  Creatinine is elevated also consistent with prior.  There are no significant electrolyte abnormalities.  Lipase is mildly elevated but improved from prior.  Bilirubin is elevated when compared to prior but not significantly.  The patient clinically does not appear jaundiced.  We will give fluids, Zofran, add on ammonia and magnesium and reassess.  The patient is on the cardiac monitor to evaluate for evidence of arrhythmia and/or significant heart rate changes.  ----------------------------------------- 5:40 PM on 11/13/2021 -----------------------------------------  Additional labs are reassuring.  Ammonia is only minimally elevated and the magnesium is normal.  He feels significantly better after fluids and Zofran and would like to go home.  I feel that he is appropriate for discharge at this time.  Overall I suspect gastroenteritis or other benign etiology of his acute presentation.  He will follow-up with his regular doctors.  I gave him thorough return precautions and he expressed understanding.   FINAL CLINICAL IMPRESSION(S) / ED DIAGNOSES   Final diagnoses:  Nausea and vomiting, unspecified vomiting type     Rx / DC Orders   ED Discharge Orders          Ordered    ondansetron (ZOFRAN-ODT) 4 MG disintegrating tablet  Every 8 hours PRN        11/13/21 1729             Note:  This document was prepared using Dragon voice recognition software and may include unintentional dictation errors.    Arta Silence, MD 11/13/21 1742    Arta Silence, MD 11/13/21 1743

## 2021-11-17 ENCOUNTER — Telehealth: Payer: BC Managed Care – PPO | Admitting: Family Medicine

## 2021-11-17 ENCOUNTER — Ambulatory Visit: Payer: Self-pay | Admitting: *Deleted

## 2021-11-17 DIAGNOSIS — J069 Acute upper respiratory infection, unspecified: Secondary | ICD-10-CM

## 2021-11-17 MED ORDER — FLUTICASONE PROPIONATE 50 MCG/ACT NA SUSP: 2.0000 | Freq: Every day | NASAL | 0 refills | Status: DC

## 2021-11-17 MED ORDER — BENZONATATE 100 MG PO CAPS: 100.0000 mg | ORAL_CAPSULE | Freq: Two times a day (BID) | ORAL | 0 refills | Status: DC | PRN

## 2021-11-17 NOTE — Progress Notes (Signed)
Virtual Visit Consent   Jesse Zimmerman, you are scheduled for a virtual visit with a [facility] provider today. Just as with appointments in the office, your consent must be obtained to participate. Your consent will be active for this visit and any virtual visit you may have with one of our providers in the next 365 days. If you have a MyChart account, a copy of this consent can be sent to you electronically.  As this is a virtual visit, video technology does not allow for your provider to perform a traditional examination. This may limit your provider's ability to fully assess your condition. If your provider identifies any concerns that need to be evaluated in person or the need to arrange testing (such as labs, EKG, etc.), we will make arrangements to do so. Although advances in technology are sophisticated, we cannot ensure that it will always work on either your end or our end. If the connection with a video visit is poor, the visit may have to be switched to a telephone visit. With either a video or telephone visit, we are not always able to ensure that we have a secure connection.  By engaging in this virtual visit, you consent to the provision of healthcare and authorize for your insurance to be billed (if applicable) for the services provided during this visit. Depending on your insurance coverage, you may receive a charge related to this service.  I need to obtain your verbal consent now. Are you willing to proceed with your visit today? [name] has provided verbal consent on 11/17/2021 for a virtual visit (video or telephone). [name], NP  Date: 11/17/2021 11:15 AM  Virtual Visit via Video Note   I, [name], connected with  Jesse Zimmerman  ([id number], 13-Jul-1960) on 11/17/21 at 11:15 AM EDT by a video-enabled telemedicine application and verified that I am speaking with the correct person using two identifiers.  Location: Patient: Virtual Visit Location Patient:  Home Provider: Virtual Visit Location Provider: Home Office   I discussed the limitations of evaluation and management by telemedicine and the availability of in person appointments. The patient expressed understanding and agreed to proceed.    History of Present Illness: Jesse Zimmerman is a 61 y.o. who identifies as a male who was assigned male at birth, and is being seen today for sore throat and congestion with cough. Onset was last week with vomiting and diarrhea went to ED for this. Since Sunday he has had a cough, congestion and sore throat. Unable to sleep- uses CPAP and was unable to tolerate it. Does also have a headache. Is followed at [facility] for liver cirrhosis and on transplant team.  Denies any more n/v at this time. Started taking Claritin without relief.   Denies fevers and chills, chest pain, shortness of breath, no sick contacts.   Problems: Patient Active Problem List   Diagnosis Date Noted   Increased ammonia level 09/13/2021   Stage 3b chronic kidney disease (CKD) ([place]) 09/13/2021   Type II diabetes mellitus with renal manifestations ([place]) 09/13/2021   NASH (nonalcoholic steatohepatitis) 09/13/2021   Obesity, Class III, BMI 40-49.9 (morbid obesity) ([place]) 09/13/2021   Macrocytic anemia 09/13/2021   COVID-19 virus infection 03/31/2021   Decompensated hepatic cirrhosis ([place]) 03/31/2021   AKI (acute kidney injury) ([place]) 03/31/2021   Anemia 03/31/2021   AMS (altered mental status) 03/30/2021   Hearing loss, sensorineural, asymmetrical 03/27/2020   Pre-transplant evaluation for liver transplant 02/23/2020  Sepsis ([place]) 01/22/2020   ARF (acute renal failure) ([place]) 01/22/2020   Thrombocytopenia ([place]) 01/22/2020   Calculus of gallbladder with acute cholecystitis without obstruction 10/18/2019   Melena    Acute gastric ulcer with hemorrhage    Acute gastritis without hemorrhage    GI bleed 07/17/2018   Blood in stool    Secondary esophageal varices without bleeding ([place])     Portal hypertension ([place])    Upper GI bleed 04/17/2017   Contusion, back 12/26/2014   Bruising 12/26/2014   Back pain, chronic 11/22/2014   Diabetes mellitus type 2 in obese ([place]) 11/22/2014   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   Acid reflux 11/22/2014   HLD (hyperlipidemia) 11/22/2014   BP (high blood pressure) 11/22/2014   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   Cannot sleep 11/22/2014   Cirrhosis ([place]) 11/22/2014   Adiposity 11/22/2014   Change in blood platelet count 11/22/2014   Avitaminosis D 11/22/2014   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 (Patient not taking: Reported on 10/01/2021), Disp: 18 g, Rfl: 1   blood glucose meter kit and supplies, Dispense based on patient and insurance preference. Use once daily as directed. (FOR ICD-10 E11.69)., Disp: 1 each, Rfl: 0   cetirizine (ZYRTEC) 10 MG tablet, Take 1 tablet (10 mg total) by mouth daily., Disp: 30 tablet, Rfl: 11   Continuous Blood Gluc Receiver (FREESTYLE LIBRE 2 READER) DEVI, 1 each by Does not apply route daily., Disp: 1 each, Rfl: 0   Continuous Blood Gluc Sensor (FREESTYLE LIBRE 2 SENSOR) MISC, 1 each by Does not apply route daily., Disp: 1 each, Rfl: 12   Dulaglutide (TRULICITY) 1.5 DD/2.2GU SOPN, Inject 1.5 mg into the skin once a week., Disp: 2 mL, Rfl: 5   ferrous sulfate 325 (65 FE) MG EC tablet, Take 1 tablet by mouth every morning., Disp: , Rfl:    furosemide (LASIX) 20 MG tablet, Take 3 tablets (60 mg total) by mouth as directed. Takes 40 mg in AM and 20 mg in evening, Disp: 30 tablet, Rfl: 1   gabapentin  (NEURONTIN) 100 MG capsule, Take 1 capsule (100 mg total) by mouth at bedtime., Disp: 90 capsule, Rfl: 3   hydrOXYzine (VISTARIL) 25 MG capsule, Take 25 mg by mouth 2 (two) times daily., Disp: , Rfl:    JARDIANCE 25 MG TABS tablet, TAKE 1 TABLET BY MOUTH DAILY, Disp: 90 tablet, Rfl: 3   lactulose (CHRONULAC) 10 GM/15ML solution, Take 30 mLs (20 g total) by mouth daily., Disp: 437 mL, Rfl: 3   loratadine (CLARITIN) 10 MG tablet, Take 1 tablet (10 mg total) by mouth daily., Disp: 30 tablet, Rfl: 11   omeprazole (PRILOSEC) 20 MG capsule, Take 1 capsule (20 mg total) by mouth daily., Disp: 90 capsule, Rfl: 1   ondansetron (ZOFRAN-ODT) 4 MG disintegrating tablet, Take 1 tablet (4 mg total) by mouth every 8 (eight) hours as needed for nausea or vomiting., Disp: 12 tablet, Rfl: 0   propranolol (INDERAL) 20 MG tablet, Take 1 tablet (20 mg total) by mouth 2 (two) times  daily., Disp: 60 tablet, Rfl: 2   sertraline (ZOLOFT) 50 MG tablet, TAKE 1 TABLET(50 MG) BY MOUTH DAILY, Disp: 90 tablet, Rfl: 3   spironolactone (ALDACTONE) 100 MG tablet, TAKE ONE-HALF TABLET BY  MOUTH daily, Disp: 180 tablet, Rfl: 0   TRULICITY 2.94 TM/5.4YT SOPN, INJECT THE CONTENTS OF ONE  PEN SUBCUTANEOUSLY WEEKLY  AS DIRECTED, Disp: 6 mL, Rfl: 3  Observations/Objective: Patient is well-developed, well-nourished in no acute distress.  Resting comfortably  at home.  Head is normocephalic, atraumatic.  No labored breathing.  Speech is clear and coherent with logical content.  Patient is alert and oriented at baseline.    Assessment and Plan:  1. Viral URI with cough  - fluticasone (FLONASE) 50 MCG/ACT nasal spray; Place 2 sprays into both nostrils daily.  Dispense: 16 g; Refill: 0 - benzonatate (TESSALON) 100 MG capsule; Take 1 capsule (100 mg total) by mouth 2 (two) times daily as needed for cough.  Dispense: 20 capsule; Refill: 0  Most likely viral URI given the setting of being in ED for a while last week Advised to try to  continue claritin, start flonase and perles as needed.  Follow up if not improved   Reviewed side effects, risks and benefits of medication.    Patient acknowledged agreement and understanding of the plan.  .  Follow Up Instructions: I discussed the assessment and treatment plan with the patient. The patient was provided an opportunity to ask questions and all were answered. The patient agreed with the plan and demonstrated an understanding of the instructions.  A copy of instructions were sent to the patient via MyChart unless otherwise noted below.    The patient was advised to call back or seek an in-person evaluation if the symptoms worsen or if the condition fails to improve as anticipated.  Time:  I spent 15 minutes with the patient via telehealth technology discussing the above problems/concerns.    [name], NP

## 2021-11-17 NOTE — Telephone Encounter (Signed)
Per agent: "Patient states he is not sleeping well and because of that is not feeling well at all.  Patient does have a hospital follow up 11/18/2021 at [facility].  Patient wants to know what he can take to help with sleep at this time."  Chief Complaint: Sore throat,cough Symptoms: sore throat,cough,runny nose,chest congestion,SOB with Cpap at night, fatigue Frequency: Yesterday or Sunday Pertinent Negatives: Patient denies fever Disposition: [] ED /[] Urgent Care (no appt availability in office) / [] Appointment(In office/virtual)/ [x]  [facility]/ [] Home Care/ [] Refused Recommended Disposition /[] [facility]/ []  Follow-up with PCP Additional Notes:Pt has F/U hospital appt tomorrow secured by agent prior to triage. Called practice for consult, Jiles Garter, advised [facility] for acute issues,OV tomorrow as scheduled.Assisted with securing [facility] for this AM.  Care advise provided, advised ED for ANY worsening symptoms.    SEVERE (e.g., excruciating) throat pain  Answer Assessment - Initial Assessment Questions 1. ONSET: "When did the throat start hurting?" (Hours or days ago)      Sunday or yesterday 2. SEVERITY: "How bad is the sore throat?" (Scale 1-10; mild, moderate or severe)   - MILD (1-3):  doesn't interfere with eating or normal activities   - MODERATE (4-7): interferes with eating some solids and normal activities   - SEVERE (8-10):  excruciating pain, interferes with most normal activities   - SEVERE DYSPHAGIA: can't swallow liquids, drooling     8/10 3. STREP EXPOSURE: "Has there been any exposure to strep within the past week?" If Yes, ask: "What type of contact occurred?"      Unsure, in ED 4.  VIRAL SYMPTOMS: "Are there any symptoms of a cold, such as a runny nose, cough, hoarse voice or red eyes?"      Yes runny nose 5. FEVER: "Do you have a fever?" If Yes, ask: "What is your temperature, how was it measured, and when did it start?"     no 6. PUS ON THE  TONSILS: "Is there pus on the tonsils in the back of your throat?"     no 7. OTHER SYMPTOMS: "Do you have any other symptoms?" (e.g., difficulty breathing, headache, rash)     Nausea, vomiting x 1, Cough,yellowish, Runny nose,SOB at night, has CPAP, lightheaded "I'm anemic"  Protocols used: Sore Throat-A-AH       Reason for Disposition  SEVERE (e.g., excruciating) throat pain  Answer Assessment - Initial Assessment Questions 1. ONSET: "When did the throat start hurting?" (Hours or days ago)      Sunday or yesterday 2. SEVERITY: "How bad is the sore throat?" (Scale 1-10; mild, moderate or severe)   - MILD (1-3):  doesn't interfere with eating or normal activities   - MODERATE (4-7): interferes with eating some solids and normal activities   - SEVERE (8-10):  excruciating pain, interferes with most normal activities   - SEVERE DYSPHAGIA: can't swallow liquids, drooling     8/10 3. STREP EXPOSURE: "Has there been any exposure to strep within the past week?" If Yes, ask: "What type of contact occurred?"      Unsure, in ED 4.  VIRAL SYMPTOMS: "Are there any symptoms of a cold, such as a runny nose, cough, hoarse voice or red eyes?"      Yes runny nose 5. FEVER: "Do you have a fever?" If Yes, ask: "What is your temperature, how was it measured, and when did it start?"     no 6. PUS ON THE TONSILS: "Is there pus on  the tonsils in the back of your throat?"     no 7. OTHER SYMPTOMS: "Do you have any other symptoms?" (e.g., difficulty breathing, headache, rash)     Nausea, vomiting x 1, Cough,yellowish, Runny nose,SOB at night, has CPAP, lightheaded "I'm anemic"  Protocols used: Sore Throat-A-AH

## 2021-11-17 NOTE — Progress Notes (Unsigned)
I,[name],acting as a Education administrator for [employer], PA-C.,have documented all relevant documentation on the behalf of Jesse Kirschner, PA-C,as directed by  Jesse Kirschner, PA-C while in the presence of Jesse Kirschner, PA-C.  Established patient visit   Patient: Jesse Zimmerman   DOB: 06-03-1961   61 y.o. Male  MRN: [id number] Visit Date: 11/18/2021  Today's healthcare provider: [name], PA-C  Cc. Nausea, vomiting, dehydration  Subjective    HPI   Wasyl is a 61 y/o male with liver cirrhosis on the transplant list who presents today with continued nausea, vomiting, diarrhea for 2 weeks. He was seen at [facility] 5/26, given fluids and discharged. Reports nausea is improved w/ zofran but overall does not feel better, unable to keep down fluids or food. Denies dizziness but feels unsteady. Denies falls.  Denies hematemesis.   Reports feeling a cold starting a few days ago, congestion, sore throat, cough, SOB.   Follow up Hospitalization  Patient was admitted to [facility] on 11/13/21 and discharged on 11/13/21. He was treated for nausea and vomiting with some associated mild abdominal discomfort and diarrhea. Treatment for this included Zofran from EMS, fluids, add on ammonia and magnesium Telephone follow up was done on n/a He reports excellent compliance with treatment. He reports this condition is stayed the same. States he is still vomiting every time he eats and was only given 12 capsules of zofran when leaving.  ----------------------------------------------------------------------------------------- -   Medications: Outpatient Medications Prior to Visit  Medication Sig   blood glucose meter kit and supplies Dispense based on patient and insurance preference. Use once daily as directed. (FOR ICD-10 E11.69).   cetirizine (ZYRTEC) 10 MG tablet Take 1 tablet (10 mg total) by mouth daily.   Continuous Blood Gluc Receiver (FREESTYLE LIBRE 2 READER) DEVI 1 each by Does not apply  route daily.   Continuous Blood Gluc Sensor (FREESTYLE LIBRE 2 SENSOR) MISC 1 each by Does not apply route daily.   Dulaglutide (TRULICITY) 1.5 NG/2.9BM SOPN Inject 1.5 mg into the skin once a week.   ferrous sulfate 325 (65 FE) MG EC tablet Take 1 tablet by mouth every morning.   gabapentin (NEURONTIN) 100 MG capsule Take 1 capsule (100 mg total) by mouth at bedtime.   hydrOXYzine (VISTARIL) 25 MG capsule Take 25 mg by mouth 2 (two) times daily.   JARDIANCE 25 MG TABS tablet TAKE 1 TABLET BY MOUTH DAILY   lactulose (CHRONULAC) 10 GM/15ML solution Take 30 mLs (20 g total) by mouth daily.   loratadine (CLARITIN) 10 MG tablet Take 1 tablet (10 mg total) by mouth daily.   omeprazole (PRILOSEC) 20 MG capsule Take 1 capsule (20 mg total) by mouth daily.   propranolol (INDERAL) 20 MG tablet Take 1 tablet (20 mg total) by mouth 2 (two) times daily.   sertraline (ZOLOFT) 50 MG tablet TAKE 1 TABLET(50 MG) BY MOUTH DAILY   spironolactone (ALDACTONE) 100 MG tablet TAKE ONE-HALF TABLET BY  MOUTH daily   [DISCONTINUED] ondansetron (ZOFRAN-ODT) 4 MG disintegrating tablet Take 1 tablet (4 mg total) by mouth every 8 (eight) hours as needed for nausea or vomiting.   albuterol (VENTOLIN HFA) 108 (90 Base) MCG/ACT inhaler Inhale 2 puffs into the lungs every 6 (six) hours as needed for wheezing or shortness of breath. (Patient not taking: Reported on 10/01/2021)   benzonatate (TESSALON) 100 MG capsule Take 1 capsule (100 mg total) by mouth 2 (two) times daily as needed for cough. (Patient not taking: Reported on  11/18/2021)   fluticasone (FLONASE) 50 MCG/ACT nasal spray Place 2 sprays into both nostrils daily. (Patient not taking: Reported on 11/18/2021)   furosemide (LASIX) 20 MG tablet Take 3 tablets (60 mg total) by mouth as directed. Takes 40 mg in AM and 20 mg in evening   TRULICITY 8.54 OE/7.0JJ SOPN INJECT THE CONTENTS OF ONE  PEN SUBCUTANEOUSLY WEEKLY  AS DIRECTED (Patient not taking: Reported on 11/18/2021)    No facility-administered medications prior to visit.    Review of Systems  Constitutional:  Positive for fatigue.  HENT:  Positive for congestion.   Respiratory:  Positive for cough and shortness of breath.   Gastrointestinal:  Positive for diarrhea, nausea and vomiting.      Objective    Blood pressure (!) 121/40, pulse (!) 52, height 6' (1.829 m), weight (!) 327 lb 12.8 oz (148.7 kg), SpO2 100 %.   Physical Exam Constitutional:      General: He is awake.     Appearance: He is well-developed.  HENT:     Head: Normocephalic.  Eyes:     General: Scleral icterus present.     Comments: Dry mucous membrane  Cardiovascular:     Rate and Rhythm: Normal rate and regular rhythm.     Heart sounds: Normal heart sounds.  Pulmonary:     Effort: Pulmonary effort is normal.     Breath sounds: Wheezing present.  Skin:    General: Skin is warm.  Neurological:     Mental Status: He is alert and oriented to person, place, and time.  Psychiatric:        Attention and Perception: Attention normal.        Mood and Affect: Mood normal.        Speech: Speech normal.        Behavior: Behavior is cooperative.     No results found for any visits on 11/18/21.  Assessment & Plan     Problem List Items Addressed This Visit       Respiratory   Upper respiratory tract infection    Advised pt to mention cough in ED, likely needs CXR         Digestive   Decompensated hepatic cirrhosis ([place])    Chronic, managed by [facility] On transplant list       Nausea and vomiting - Primary    No improvement in two weeks, nausea is controlled with Zofran, will send in more We will stay off of the trulicity for now On discussion w/ patient, we are in agreement that he is dehydrated and needs fluids, refer to ED for management.       Relevant Medications   ondansetron (ZOFRAN-ODT) 4 MG disintegrating tablet   I, Jesse Kirschner, PA-C have reviewed all documentation for this visit.  The documentation on 11/18/2021  for the exam, diagnosis, procedures, and orders are all accurate and complete.  Jesse Kirschner, PA-C [facility] [address] [place], [place], [address] Office: [phone] Fax: [place]

## 2021-11-17 NOTE — Patient Instructions (Signed)

## 2021-11-18 ENCOUNTER — Emergency Department
Admission: EM | Admit: 2021-11-18 | Discharge: 2021-11-18 | Disposition: A | Discharge status: Home / Self Care | Payer: BC Managed Care – PPO | Attending: Emergency Medicine | Admitting: Emergency Medicine

## 2021-11-18 ENCOUNTER — Ambulatory Visit (INDEPENDENT_AMBULATORY_CARE_PROVIDER_SITE_OTHER): Payer: BC Managed Care – PPO | Admitting: Physician Assistant

## 2021-11-18 ENCOUNTER — Emergency Department: Payer: BC Managed Care – PPO

## 2021-11-18 ENCOUNTER — Encounter: Payer: Self-pay | Admitting: Physician Assistant

## 2021-11-18 ENCOUNTER — Other Ambulatory Visit: Payer: Self-pay

## 2021-11-18 VITALS — BP 121/40 | HR 52 | Ht 72.0 in | Wt 327.8 lb

## 2021-11-18 DIAGNOSIS — J069 Acute upper respiratory infection, unspecified: Secondary | ICD-10-CM

## 2021-11-18 DIAGNOSIS — K7031 Alcoholic cirrhosis of liver with ascites: Secondary | ICD-10-CM | POA: Insufficient documentation

## 2021-11-18 DIAGNOSIS — K7290 Hepatic failure, unspecified without coma: Secondary | ICD-10-CM | POA: Diagnosis not present

## 2021-11-18 DIAGNOSIS — K7460 Unspecified cirrhosis of liver: Secondary | ICD-10-CM

## 2021-11-18 DIAGNOSIS — K7469 Other cirrhosis of liver: Secondary | ICD-10-CM | POA: Diagnosis not present

## 2021-11-18 DIAGNOSIS — I129 Hypertensive chronic kidney disease with stage 1 through stage 4 chronic kidney disease, or unspecified chronic kidney disease: Secondary | ICD-10-CM | POA: Diagnosis not present

## 2021-11-18 DIAGNOSIS — E860 Dehydration: Secondary | ICD-10-CM

## 2021-11-18 DIAGNOSIS — R188 Other ascites: Secondary | ICD-10-CM

## 2021-11-18 DIAGNOSIS — Z8616 Personal history of COVID-19: Secondary | ICD-10-CM | POA: Insufficient documentation

## 2021-11-18 DIAGNOSIS — N1832 Chronic kidney disease, stage 3b: Secondary | ICD-10-CM | POA: Diagnosis not present

## 2021-11-18 DIAGNOSIS — R42 Dizziness and giddiness: Secondary | ICD-10-CM | POA: Diagnosis not present

## 2021-11-18 DIAGNOSIS — N189 Chronic kidney disease, unspecified: Secondary | ICD-10-CM | POA: Diagnosis not present

## 2021-11-18 DIAGNOSIS — I700 Atherosclerosis of aorta: Secondary | ICD-10-CM | POA: Diagnosis not present

## 2021-11-18 DIAGNOSIS — N200 Calculus of kidney: Secondary | ICD-10-CM | POA: Diagnosis not present

## 2021-11-18 DIAGNOSIS — K8020 Calculus of gallbladder without cholecystitis without obstruction: Secondary | ICD-10-CM | POA: Diagnosis not present

## 2021-11-18 DIAGNOSIS — J9811 Atelectasis: Secondary | ICD-10-CM | POA: Diagnosis not present

## 2021-11-18 DIAGNOSIS — R112 Nausea with vomiting, unspecified: Secondary | ICD-10-CM

## 2021-11-18 DIAGNOSIS — E119 Type 2 diabetes mellitus without complications: Secondary | ICD-10-CM | POA: Insufficient documentation

## 2021-11-18 DIAGNOSIS — R197 Diarrhea, unspecified: Secondary | ICD-10-CM | POA: Diagnosis not present

## 2021-11-18 DIAGNOSIS — Z20822 Contact with and (suspected) exposure to covid-19: Secondary | ICD-10-CM | POA: Diagnosis not present

## 2021-11-18 DIAGNOSIS — K766 Portal hypertension: Secondary | ICD-10-CM | POA: Diagnosis not present

## 2021-11-18 DIAGNOSIS — J90 Pleural effusion, not elsewhere classified: Secondary | ICD-10-CM | POA: Diagnosis not present

## 2021-11-18 LAB — CBC
HCT: 22.5 % — ABNORMAL LOW (ref 39.0–52.0)
Hemoglobin: 7.5 g/dL — ABNORMAL LOW (ref 13.0–17.0)
MCH: 34.2 pg — ABNORMAL HIGH (ref 26.0–34.0)
MCHC: 33.3 g/dL (ref 30.0–36.0)
MCV: 102.7 fL — ABNORMAL HIGH (ref 80.0–100.0)
Platelets: 37 10*3/uL — ABNORMAL LOW (ref 150–400)
RBC: 2.19 MIL/uL — ABNORMAL LOW (ref 4.22–5.81)
RDW: 16 % — ABNORMAL HIGH (ref 11.5–15.5)
WBC: 3.3 10*3/uL — ABNORMAL LOW (ref 4.0–10.5)
nRBC: 0 % (ref 0.0–0.2)

## 2021-11-18 LAB — RESP PANEL BY RT-PCR (FLU A&B, COVID) ARPGX2
Influenza A by PCR: NEGATIVE
Influenza B by PCR: NEGATIVE
SARS Coronavirus 2 by RT PCR: NEGATIVE

## 2021-11-18 LAB — BASIC METABOLIC PANEL
Anion gap: 6 (ref 5–15)
BUN: 33 mg/dL — ABNORMAL HIGH (ref 6–20)
CO2: 25 mmol/L (ref 22–32)
Calcium: 8.1 mg/dL — ABNORMAL LOW (ref 8.9–10.3)
Chloride: 102 mmol/L (ref 98–111)
Creatinine, Ser: 2.36 mg/dL — ABNORMAL HIGH (ref 0.61–1.24)
GFR, Estimated: 31 mL/min — ABNORMAL LOW (ref >60)
Glucose, Bld: 116 mg/dL — ABNORMAL HIGH (ref 70–99)
Potassium: 4.3 mmol/L (ref 3.5–5.1)
Sodium: 133 mmol/L — ABNORMAL LOW (ref 135–145)

## 2021-11-18 LAB — HEPATIC FUNCTION PANEL
ALT: 21 U/L (ref 0–44)
AST: 45 U/L — ABNORMAL HIGH (ref 15–41)
Albumin: 2.5 g/dL — ABNORMAL LOW (ref 3.5–5.0)
Alkaline Phosphatase: 65 U/L (ref 38–126)
Bilirubin, Direct: 0.5 mg/dL — ABNORMAL HIGH (ref 0.0–0.2)
Indirect Bilirubin: 3.1 mg/dL — ABNORMAL HIGH (ref 0.3–0.9)
Total Bilirubin: 3.6 mg/dL — ABNORMAL HIGH (ref 0.3–1.2)
Total Protein: 6.4 g/dL — ABNORMAL LOW (ref 6.5–8.1)

## 2021-11-18 LAB — MAGNESIUM: Magnesium: 2 mg/dL (ref 1.7–2.4)

## 2021-11-18 LAB — LIPASE, BLOOD: Lipase: 41 U/L (ref 11–51)

## 2021-11-18 MED ORDER — SODIUM CHLORIDE 0.9 % IV BOLUS
1000.0000 mL | Freq: Once | INTRAVENOUS | Status: AC
  Administered 2021-11-18: 1000 mL via INTRAVENOUS

## 2021-11-18 MED ORDER — LACTATED RINGERS IV BOLUS: 1000.0000 mL | Freq: Once | INTRAVENOUS | Status: DC

## 2021-11-18 MED ORDER — ONDANSETRON HCL 4 MG/2ML IJ SOLN
4.0000 mg | Freq: Once | INTRAMUSCULAR | Status: AC
  Administered 2021-11-18: 4 mg via INTRAVENOUS
  Filled 2021-11-18: qty 2

## 2021-11-18 MED ORDER — ONDANSETRON 4 MG PO TBDP: 4.0000 mg | ORAL_TABLET | Freq: Three times a day (TID) | ORAL | 0 refills | Status: DC | PRN

## 2021-11-18 NOTE — ED Triage Notes (Signed)
Pt here for dehydration. Pt was sent by his primary. Pt states he only received 1 bag of fluids when he came a few days ago but states that was not enough. Pt has a list of medical problems.

## 2021-11-18 NOTE — ED Provider Notes (Signed)
[facility] Provider Note    Event Date/Time   First MD Initiated Contact with Patient 11/18/21 1201     (approximate)   History   Chief Complaint: Dehydration   HPI  Jesse Zimmerman is a 61 y.o. male with a history of diabetes, GERD, cirrhosis secondary to [place] with portal hypertension and esophageal and gastric varices who comes ED complaining of dehydration.  Reports lightheadedness with standing.  No chest pain shortness of breath or fever, no significant abdominal pain, no black or bloody stool or hematemesis.  Reports that he has been having increased nausea and vomiting since going to 1.5 mg dose of Trulicity.  During this time with decreased oral intake, he has continued taking his Lasix and spironolactone as usual.  He spoke with his doctor and plans to discontinue Trulicity for a period of time before restarting it at a lower 0.75 mg dose.     Physical Exam   Triage Vital Signs: ED Triage Vitals [11/18/21 1053]  Enc Vitals Group     BP (!) 105/42     Pulse Rate (!) 49     Resp 16     Temp 98.4 F (36.9 C)     Temp Source Oral     SpO2 100 %     Weight (!) 327 lb (148.3 kg)     Height 6' (1.829 m)     Head Circumference      Peak Flow      Pain Score 6     Pain Loc      Pain Edu?      Excl. in [place]?     Most recent vital signs: Vitals:   11/18/21 1214 11/18/21 1330  BP: (!) 147/50 136/66  Pulse:  (!) 57  Resp:  17  Temp:    SpO2: 100% 100%    General: Awake, no distress.  CV:  Good peripheral perfusion.  Regular rate and rhythm Resp:  Normal effort.  Clear to auscultation bilaterally Abd:  No distention.  Soft and nontender Other:  1+ pitting edema bilateral lower extremities, symmetric, no calf tenderness.  No rash.   ED Results / Procedures / Treatments   Labs (all labs ordered are listed, but only abnormal results are displayed) Labs Reviewed  CBC - Abnormal; Notable for the following components:      Result Value    WBC 3.3 (*)    RBC 2.19 (*)    Hemoglobin 7.5 (*)    HCT 22.5 (*)    MCV 102.7 (*)    MCH 34.2 (*)    RDW 16.0 (*)    Platelets 37 (*)    All other components within normal limits  BASIC METABOLIC PANEL - Abnormal; Notable for the following components:   Sodium 133 (*)    Glucose, Bld 116 (*)    BUN 33 (*)    Creatinine, Ser 2.36 (*)    Calcium 8.1 (*)    GFR, Estimated 31 (*)    All other components within normal limits  HEPATIC FUNCTION PANEL - Abnormal; Notable for the following components:   Total Protein 6.4 (*)    Albumin 2.5 (*)    AST 45 (*)    Total Bilirubin 3.6 (*)    Bilirubin, Direct 0.5 (*)    Indirect Bilirubin 3.1 (*)    All other components within normal limits  RESP PANEL BY RT-PCR (FLU A&B, COVID) ARPGX2  LIPASE, BLOOD  MAGNESIUM  EKG Interpreted by me Sinus bradycardia rate of 51.  Normal axis, first-degree AV block.  Poor R wave progression.  Normal ST segments and T waves.   RADIOLOGY CT chest abdomen pelvis viewed interpreted by me, no apparent infiltrate, no bowel obstruction or hernia, ascites present.  Radiology report reviewed   PROCEDURES:  Procedures   MEDICATIONS ORDERED IN ED: Medications  sodium chloride 0.9 % bolus 1,000 mL (0 mLs Intravenous Stopped 11/18/21 1213)  ondansetron (ZOFRAN) injection 4 mg (4 mg Intravenous Given 11/18/21 1327)     IMPRESSION / MDM / ASSESSMENT AND PLAN / ED COURSE  I reviewed the triage vital signs and the nursing notes.                              Differential diagnosis includes, but is not limited to, medication side effect, dehydration, acute on chronic renal insufficiency, electrolyte abnormality, acute on chronic anemia, bowel obstruction, hernia, diverticulitis, intra-abdominal abscess, pneumonia  Patient's presentation is most consistent with acute complicated illness / injury requiring diagnostic workup.  Patient with extensive and severe chronic comorbidities comes ED complaining  of lightheadedness, vomiting.  Most likely related to dehydration in the setting of continued diuretic use despite not tolerating oral intake after higher dose Trulicity.  Lab showed baseline stable anemia, thrombocytopenia, CKD.  LFTs stable as well.  COVID and flu negative.  CT chest abdomen pelvis shows no acute findings.  Patient is feeling back to normal after receiving IV fluid, ambulatory without symptoms.  Not requiring admission due to his improvement and reassuring work-up.  He will continue following up with primary care.       FINAL CLINICAL IMPRESSION(S) / ED DIAGNOSES   Final diagnoses:  Dehydration  Stage 3b chronic kidney disease (HCC)  Cirrhosis of liver with ascites, unspecified hepatic cirrhosis type ([place])     Rx / DC Orders   ED Discharge Orders          Ordered    ondansetron (ZOFRAN-ODT) 4 MG disintegrating tablet  Every 8 hours PRN        11/18/21 1454             Note:  This document was prepared using Dragon voice recognition software and may include unintentional dictation errors.   Carrie Mew, MD 11/18/21 1525

## 2021-11-18 NOTE — Assessment & Plan Note (Addendum)
No improvement in two weeks, nausea is controlled with Zofran, will send in more We will stay off of the trulicity for now On discussion w/ patient, we are in agreement that he is dehydrated and needs fluids, refer to ED for management.

## 2021-11-18 NOTE — Assessment & Plan Note (Signed)
Advised pt to mention cough in ED, likely needs CXR

## 2021-11-18 NOTE — ED Notes (Signed)
Pt c/o N/V/D for the past 2 weeks, pt has a hx of cirrhosis of the liver and is on the transplant list, states also has a cough with congestion in the past couple of days, denies any black or bloody stool or emesis. States he has just felt drained and tired. Denies pain. States he took a dose of lactulose last night to help prevent elevated ammonia levels. Denies any confusion, spouse is at the bedside.

## 2021-11-18 NOTE — Assessment & Plan Note (Signed)
Chronic, managed by [facility] On transplant list

## 2021-11-21 DIAGNOSIS — N183 Chronic kidney disease, stage 3 unspecified: Secondary | ICD-10-CM | POA: Diagnosis not present

## 2021-11-21 DIAGNOSIS — M549 Dorsalgia, unspecified: Secondary | ICD-10-CM | POA: Diagnosis not present

## 2021-11-21 DIAGNOSIS — S82401D Unspecified fracture of shaft of right fibula, subsequent encounter for closed fracture with routine healing: Secondary | ICD-10-CM | POA: Diagnosis not present

## 2021-11-21 DIAGNOSIS — R5381 Other malaise: Secondary | ICD-10-CM | POA: Diagnosis not present

## 2021-11-21 DIAGNOSIS — W1839XA Other fall on same level, initial encounter: Secondary | ICD-10-CM | POA: Diagnosis not present

## 2021-11-21 DIAGNOSIS — F54 Psychological and behavioral factors associated with disorders or diseases classified elsewhere: Secondary | ICD-10-CM | POA: Diagnosis not present

## 2021-11-21 DIAGNOSIS — Z903 Acquired absence of stomach [part of]: Secondary | ICD-10-CM | POA: Diagnosis not present

## 2021-11-21 DIAGNOSIS — S82831A Other fracture of upper and lower end of right fibula, initial encounter for closed fracture: Secondary | ICD-10-CM | POA: Diagnosis not present

## 2021-11-21 DIAGNOSIS — K5900 Constipation, unspecified: Secondary | ICD-10-CM | POA: Diagnosis not present

## 2021-11-21 DIAGNOSIS — D631 Anemia in chronic kidney disease: Secondary | ICD-10-CM | POA: Diagnosis not present

## 2021-11-21 DIAGNOSIS — S92411A Displaced fracture of proximal phalanx of right great toe, initial encounter for closed fracture: Secondary | ICD-10-CM | POA: Diagnosis not present

## 2021-11-21 DIAGNOSIS — R739 Hyperglycemia, unspecified: Secondary | ICD-10-CM | POA: Diagnosis not present

## 2021-11-21 DIAGNOSIS — K766 Portal hypertension: Secondary | ICD-10-CM | POA: Diagnosis not present

## 2021-11-21 DIAGNOSIS — Z6841 Body Mass Index (BMI) 40.0 and over, adult: Secondary | ICD-10-CM | POA: Diagnosis not present

## 2021-11-21 DIAGNOSIS — S42294A Other nondisplaced fracture of upper end of right humerus, initial encounter for closed fracture: Secondary | ICD-10-CM | POA: Diagnosis not present

## 2021-11-21 DIAGNOSIS — G4733 Obstructive sleep apnea (adult) (pediatric): Secondary | ICD-10-CM | POA: Diagnosis not present

## 2021-11-21 DIAGNOSIS — D84821 Immunodeficiency due to drugs: Secondary | ICD-10-CM | POA: Diagnosis not present

## 2021-11-21 DIAGNOSIS — M7989 Other specified soft tissue disorders: Secondary | ICD-10-CM | POA: Diagnosis not present

## 2021-11-21 DIAGNOSIS — Z792 Long term (current) use of antibiotics: Secondary | ICD-10-CM | POA: Diagnosis not present

## 2021-11-21 DIAGNOSIS — T8649 Other complications of liver transplant: Secondary | ICD-10-CM | POA: Diagnosis not present

## 2021-11-21 DIAGNOSIS — S82831D Other fracture of upper and lower end of right fibula, subsequent encounter for closed fracture with routine healing: Secondary | ICD-10-CM | POA: Diagnosis not present

## 2021-11-21 DIAGNOSIS — D689 Coagulation defect, unspecified: Secondary | ICD-10-CM | POA: Diagnosis not present

## 2021-11-21 DIAGNOSIS — T81719D Complication of unspecified artery following a procedure, not elsewhere classified, subsequent encounter: Secondary | ICD-10-CM | POA: Diagnosis not present

## 2021-11-21 DIAGNOSIS — S79911A Unspecified injury of right hip, initial encounter: Secondary | ICD-10-CM | POA: Diagnosis not present

## 2021-11-21 DIAGNOSIS — E119 Type 2 diabetes mellitus without complications: Secondary | ICD-10-CM | POA: Diagnosis not present

## 2021-11-21 DIAGNOSIS — T380X5D Adverse effect of glucocorticoids and synthetic analogues, subsequent encounter: Secondary | ICD-10-CM | POA: Diagnosis not present

## 2021-11-21 DIAGNOSIS — L7632 Postprocedural hematoma of skin and subcutaneous tissue following other procedure: Secondary | ICD-10-CM | POA: Diagnosis not present

## 2021-11-21 DIAGNOSIS — E785 Hyperlipidemia, unspecified: Secondary | ICD-10-CM | POA: Diagnosis not present

## 2021-11-21 DIAGNOSIS — T8643 Liver transplant infection: Secondary | ICD-10-CM | POA: Diagnosis not present

## 2021-11-21 DIAGNOSIS — R531 Weakness: Secondary | ICD-10-CM | POA: Diagnosis not present

## 2021-11-21 DIAGNOSIS — Z452 Encounter for adjustment and management of vascular access device: Secondary | ICD-10-CM | POA: Diagnosis not present

## 2021-11-21 DIAGNOSIS — E11649 Type 2 diabetes mellitus with hypoglycemia without coma: Secondary | ICD-10-CM | POA: Diagnosis not present

## 2021-11-21 DIAGNOSIS — W010XXA Fall on same level from slipping, tripping and stumbling without subsequent striking against object, initial encounter: Secondary | ICD-10-CM | POA: Diagnosis not present

## 2021-11-21 DIAGNOSIS — R918 Other nonspecific abnormal finding of lung field: Secondary | ICD-10-CM | POA: Diagnosis not present

## 2021-11-21 DIAGNOSIS — S82401A Unspecified fracture of shaft of right fibula, initial encounter for closed fracture: Secondary | ICD-10-CM | POA: Diagnosis not present

## 2021-11-21 DIAGNOSIS — T8189XA Other complications of procedures, not elsewhere classified, initial encounter: Secondary | ICD-10-CM | POA: Diagnosis not present

## 2021-11-21 DIAGNOSIS — N189 Chronic kidney disease, unspecified: Secondary | ICD-10-CM | POA: Diagnosis not present

## 2021-11-21 DIAGNOSIS — M7731 Calcaneal spur, right foot: Secondary | ICD-10-CM | POA: Diagnosis not present

## 2021-11-21 DIAGNOSIS — R7989 Other specified abnormal findings of blood chemistry: Secondary | ICD-10-CM | POA: Diagnosis not present

## 2021-11-21 DIAGNOSIS — D696 Thrombocytopenia, unspecified: Secondary | ICD-10-CM | POA: Diagnosis not present

## 2021-11-21 DIAGNOSIS — N1831 Chronic kidney disease, stage 3a: Secondary | ICD-10-CM | POA: Diagnosis not present

## 2021-11-21 DIAGNOSIS — D62 Acute posthemorrhagic anemia: Secondary | ICD-10-CM | POA: Diagnosis not present

## 2021-11-21 DIAGNOSIS — F419 Anxiety disorder, unspecified: Secondary | ICD-10-CM | POA: Diagnosis not present

## 2021-11-21 DIAGNOSIS — M79651 Pain in right thigh: Secondary | ICD-10-CM | POA: Diagnosis not present

## 2021-11-21 DIAGNOSIS — W109XXA Fall (on) (from) unspecified stairs and steps, initial encounter: Secondary | ICD-10-CM | POA: Diagnosis not present

## 2021-11-21 DIAGNOSIS — M546 Pain in thoracic spine: Secondary | ICD-10-CM | POA: Diagnosis not present

## 2021-11-21 DIAGNOSIS — E278 Other specified disorders of adrenal gland: Secondary | ICD-10-CM | POA: Diagnosis not present

## 2021-11-21 DIAGNOSIS — S99921A Unspecified injury of right foot, initial encounter: Secondary | ICD-10-CM | POA: Diagnosis not present

## 2021-11-21 DIAGNOSIS — D702 Other drug-induced agranulocytosis: Secondary | ICD-10-CM | POA: Diagnosis not present

## 2021-11-21 DIAGNOSIS — R58 Hemorrhage, not elsewhere classified: Secondary | ICD-10-CM | POA: Diagnosis not present

## 2021-11-21 DIAGNOSIS — I8510 Secondary esophageal varices without bleeding: Secondary | ICD-10-CM | POA: Diagnosis not present

## 2021-11-21 DIAGNOSIS — R040 Epistaxis: Secondary | ICD-10-CM | POA: Diagnosis not present

## 2021-11-21 DIAGNOSIS — K7581 Nonalcoholic steatohepatitis (NASH): Secondary | ICD-10-CM | POA: Diagnosis not present

## 2021-11-21 DIAGNOSIS — W19XXXA Unspecified fall, initial encounter: Secondary | ICD-10-CM | POA: Diagnosis not present

## 2021-11-21 DIAGNOSIS — F32A Depression, unspecified: Secondary | ICD-10-CM | POA: Diagnosis not present

## 2021-11-21 DIAGNOSIS — E1165 Type 2 diabetes mellitus with hyperglycemia: Secondary | ICD-10-CM | POA: Diagnosis not present

## 2021-11-21 DIAGNOSIS — Z7984 Long term (current) use of oral hypoglycemic drugs: Secondary | ICD-10-CM | POA: Diagnosis not present

## 2021-11-21 DIAGNOSIS — Y92009 Unspecified place in unspecified non-institutional (private) residence as the place of occurrence of the external cause: Secondary | ICD-10-CM | POA: Diagnosis not present

## 2021-11-21 DIAGNOSIS — Z944 Liver transplant status: Secondary | ICD-10-CM | POA: Diagnosis not present

## 2021-11-21 DIAGNOSIS — I959 Hypotension, unspecified: Secondary | ICD-10-CM | POA: Diagnosis not present

## 2021-11-21 DIAGNOSIS — R52 Pain, unspecified: Secondary | ICD-10-CM | POA: Diagnosis not present

## 2021-11-21 DIAGNOSIS — I728 Aneurysm of other specified arteries: Secondary | ICD-10-CM | POA: Diagnosis not present

## 2021-11-21 DIAGNOSIS — N170 Acute kidney failure with tubular necrosis: Secondary | ICD-10-CM | POA: Diagnosis not present

## 2021-11-21 DIAGNOSIS — K7210 Chronic hepatic failure without coma: Secondary | ICD-10-CM | POA: Diagnosis not present

## 2021-11-21 DIAGNOSIS — M25512 Pain in left shoulder: Secondary | ICD-10-CM | POA: Diagnosis not present

## 2021-11-21 DIAGNOSIS — Z7401 Bed confinement status: Secondary | ICD-10-CM | POA: Diagnosis not present

## 2021-11-21 DIAGNOSIS — G4700 Insomnia, unspecified: Secondary | ICD-10-CM | POA: Diagnosis not present

## 2021-11-21 DIAGNOSIS — S92411D Displaced fracture of proximal phalanx of right great toe, subsequent encounter for fracture with routine healing: Secondary | ICD-10-CM | POA: Diagnosis not present

## 2021-11-21 DIAGNOSIS — K91871 Postprocedural hematoma of a digestive system organ or structure following other procedure: Secondary | ICD-10-CM | POA: Diagnosis not present

## 2021-11-21 DIAGNOSIS — E1122 Type 2 diabetes mellitus with diabetic chronic kidney disease: Secondary | ICD-10-CM | POA: Diagnosis not present

## 2021-11-21 DIAGNOSIS — J986 Disorders of diaphragm: Secondary | ICD-10-CM | POA: Diagnosis not present

## 2021-11-21 DIAGNOSIS — R5383 Other fatigue: Secondary | ICD-10-CM | POA: Diagnosis not present

## 2021-11-21 DIAGNOSIS — N179 Acute kidney failure, unspecified: Secondary | ICD-10-CM | POA: Diagnosis not present

## 2021-11-21 DIAGNOSIS — R609 Edema, unspecified: Secondary | ICD-10-CM | POA: Diagnosis not present

## 2021-11-21 DIAGNOSIS — Z01818 Encounter for other preprocedural examination: Secondary | ICD-10-CM | POA: Diagnosis not present

## 2021-11-21 DIAGNOSIS — R109 Unspecified abdominal pain: Secondary | ICD-10-CM | POA: Diagnosis not present

## 2021-11-21 DIAGNOSIS — S92402A Displaced unspecified fracture of left great toe, initial encounter for closed fracture: Secondary | ICD-10-CM | POA: Diagnosis not present

## 2021-11-21 DIAGNOSIS — R509 Fever, unspecified: Secondary | ICD-10-CM | POA: Diagnosis not present

## 2021-11-21 DIAGNOSIS — Z9884 Bariatric surgery status: Secondary | ICD-10-CM | POA: Diagnosis not present

## 2021-11-21 DIAGNOSIS — Z4823 Encounter for aftercare following liver transplant: Secondary | ICD-10-CM | POA: Diagnosis not present

## 2021-11-21 DIAGNOSIS — M79661 Pain in right lower leg: Secondary | ICD-10-CM | POA: Diagnosis not present

## 2021-11-21 DIAGNOSIS — D849 Immunodeficiency, unspecified: Secondary | ICD-10-CM | POA: Diagnosis not present

## 2021-11-21 DIAGNOSIS — Z8679 Personal history of other diseases of the circulatory system: Secondary | ICD-10-CM | POA: Diagnosis not present

## 2021-11-21 DIAGNOSIS — Y33XXXA Other specified events, undetermined intent, initial encounter: Secondary | ICD-10-CM | POA: Diagnosis not present

## 2021-11-21 DIAGNOSIS — M25571 Pain in right ankle and joints of right foot: Secondary | ICD-10-CM | POA: Diagnosis not present

## 2021-11-21 DIAGNOSIS — B1910 Unspecified viral hepatitis B without hepatic coma: Secondary | ICD-10-CM | POA: Diagnosis not present

## 2021-11-21 DIAGNOSIS — Z4682 Encounter for fitting and adjustment of non-vascular catheter: Secondary | ICD-10-CM | POA: Diagnosis not present

## 2021-12-03 ENCOUNTER — Ambulatory Visit: Payer: BC Managed Care – PPO | Admitting: Family Medicine

## 2021-12-03 ENCOUNTER — Emergency Department
Admission: EM | Admit: 2021-12-03 | Discharge: 2021-12-04 | Disposition: A | Discharge status: Home / Self Care | Payer: BC Managed Care – PPO | Attending: Emergency Medicine | Admitting: Emergency Medicine

## 2021-12-03 DIAGNOSIS — S92411A Displaced fracture of proximal phalanx of right great toe, initial encounter for closed fracture: Secondary | ICD-10-CM | POA: Diagnosis not present

## 2021-12-03 DIAGNOSIS — E278 Other specified disorders of adrenal gland: Secondary | ICD-10-CM | POA: Diagnosis not present

## 2021-12-03 DIAGNOSIS — Z7984 Long term (current) use of oral hypoglycemic drugs: Secondary | ICD-10-CM | POA: Insufficient documentation

## 2021-12-03 DIAGNOSIS — M79651 Pain in right thigh: Secondary | ICD-10-CM | POA: Diagnosis not present

## 2021-12-03 DIAGNOSIS — W1839XA Other fall on same level, initial encounter: Secondary | ICD-10-CM | POA: Insufficient documentation

## 2021-12-03 DIAGNOSIS — S92402A Displaced unspecified fracture of left great toe, initial encounter for closed fracture: Secondary | ICD-10-CM | POA: Diagnosis not present

## 2021-12-03 DIAGNOSIS — W010XXA Fall on same level from slipping, tripping and stumbling without subsequent striking against object, initial encounter: Secondary | ICD-10-CM | POA: Diagnosis not present

## 2021-12-03 DIAGNOSIS — S42294A Other nondisplaced fracture of upper end of right humerus, initial encounter for closed fracture: Secondary | ICD-10-CM | POA: Diagnosis not present

## 2021-12-03 DIAGNOSIS — M25571 Pain in right ankle and joints of right foot: Secondary | ICD-10-CM | POA: Diagnosis not present

## 2021-12-03 DIAGNOSIS — M79661 Pain in right lower leg: Secondary | ICD-10-CM | POA: Insufficient documentation

## 2021-12-03 DIAGNOSIS — J986 Disorders of diaphragm: Secondary | ICD-10-CM | POA: Diagnosis not present

## 2021-12-03 DIAGNOSIS — M7989 Other specified soft tissue disorders: Secondary | ICD-10-CM | POA: Diagnosis not present

## 2021-12-03 DIAGNOSIS — S99921A Unspecified injury of right foot, initial encounter: Secondary | ICD-10-CM | POA: Diagnosis not present

## 2021-12-03 DIAGNOSIS — R918 Other nonspecific abnormal finding of lung field: Secondary | ICD-10-CM | POA: Diagnosis not present

## 2021-12-03 DIAGNOSIS — Z944 Liver transplant status: Secondary | ICD-10-CM | POA: Diagnosis not present

## 2021-12-03 DIAGNOSIS — E119 Type 2 diabetes mellitus without complications: Secondary | ICD-10-CM | POA: Insufficient documentation

## 2021-12-03 DIAGNOSIS — S82401A Unspecified fracture of shaft of right fibula, initial encounter for closed fracture: Secondary | ICD-10-CM | POA: Diagnosis not present

## 2021-12-03 DIAGNOSIS — M7731 Calcaneal spur, right foot: Secondary | ICD-10-CM | POA: Diagnosis not present

## 2021-12-03 DIAGNOSIS — W19XXXA Unspecified fall, initial encounter: Secondary | ICD-10-CM

## 2021-12-03 DIAGNOSIS — S82831A Other fracture of upper and lower end of right fibula, initial encounter for closed fracture: Secondary | ICD-10-CM | POA: Diagnosis not present

## 2021-12-03 DIAGNOSIS — R7989 Other specified abnormal findings of blood chemistry: Secondary | ICD-10-CM | POA: Diagnosis not present

## 2021-12-03 DIAGNOSIS — Y33XXXA Other specified events, undetermined intent, initial encounter: Secondary | ICD-10-CM | POA: Diagnosis not present

## 2021-12-03 DIAGNOSIS — S79911A Unspecified injury of right hip, initial encounter: Secondary | ICD-10-CM | POA: Diagnosis not present

## 2021-12-03 NOTE — ED Triage Notes (Addendum)
Pt comes from home via [facility] with complaints of imjury to right foot. Pt states he was getting out of car and stumbled out, foot got caught, and landing on his butt. There is swelling and bruising to right foot. Pt also reports pain up to thigh and reports feeling "like a charlie horse". Pt had liver transplant recently.

## 2021-12-04 ENCOUNTER — Emergency Department: Payer: BC Managed Care – PPO

## 2021-12-04 MED ORDER — MORPHINE SULFATE (PF) 4 MG/ML IV SOLN
4.0000 mg | Freq: Once | INTRAVENOUS | Status: AC
  Administered 2021-12-04: 4 mg via INTRAMUSCULAR
  Filled 2021-12-04: qty 1

## 2021-12-04 NOTE — ED Notes (Signed)
Pt discharge information reviewed. Pt understands need for follow up care and when to return if symptoms worsen. All questions answered. Pt is alert and oriented with even and regular respirations. Pt is brought out of department in wheelchair with family.  ?

## 2021-12-04 NOTE — ED Provider Notes (Signed)
[facility] Provider Note    Event Date/Time   First MD Initiated Contact with Patient 12/04/21 0005     (approximate)   History   Foot Injury   HPI  Jesse Zimmerman is a 61 y.o. male with history of cirrhosis due to NASH, diabetes, hyperlipidemia who presents to the emergency department after he had a fall.  Patient was just at [facility] and had a liver transplant, gastric bypass and cholecystectomy.  He was discharged from the hospital tonight around 7 PM.  He states when trying to get into his truck to go home his right leg gave out on him and he twisted his right ankle.  Patient states he then got home and fell again due to the pain in the RLE.  Did not hit his head with this fall either.  He is complaining of pain in the right thigh, shin, ankle and foot.  He has some swelling to the foot as well as ecchymosis.  Unable to bear weight.  States he did not fall to the ground, hit his head.  No neck or back pain.  No chest or abdominal pain.  Took 2 oxycodone prior to arrival without any relief of pain.  States he is not on blood thinners.   History provided by patient and wife.    Past Medical History:  Diagnosis Date   Chronic back pain    Cirrhosis ([place])    NASH   Diabetes mellitus without complication ([place])    Esophageal varices ([place])    Hyperlipidemia    NASH (nonalcoholic steatohepatitis)     Past Surgical History:  Procedure Laterality Date   COLONOSCOPY WITH PROPOFOL N/A 08/12/2017   Procedure: COLONOSCOPY WITH PROPOFOL;  Surgeon: Jonathon Bellows, MD;  Location[facility] ENDOSCOPY;  Service: Gastroenterology;  Laterality: N/A;   ESOPHAGOGASTRODUODENOSCOPY (EGD) WITH PROPOFOL N/A 04/18/2017   Procedure: ESOPHAGOGASTRODUODENOSCOPY (EGD) WITH PROPOFOL;  Surgeon: [name], MD;  Location: [facility] ENDOSCOPY;  Service: Endoscopy;  Laterality: N/A;   ESOPHAGOGASTRODUODENOSCOPY (EGD) WITH PROPOFOL N/A 07/18/2018   Procedure: ESOPHAGOGASTRODUODENOSCOPY  (EGD) WITH PROPOFOL;  Surgeon: [name], MD;  Location: [facility] ENDOSCOPY;  Service: Endoscopy;  Laterality: N/A;   EXCESSIVE THIGH / HIP / BUTTOCK / FLANK SKIN EXCISION     PART OF INNER THIGH/DUE TO SEPSIS INFECTION REMOVED   KIDNEY STONE SURGERY     removed   TONSILLECTOMY AND ADENOIDECTOMY      MEDICATIONS:  Prior to Admission medications   Medication Sig Start Date End Date Taking? Authorizing Provider  albuterol (VENTOLIN HFA) 108 (90 Base) MCG/ACT inhaler Inhale 2 puffs into the lungs every 6 (six) hours as needed for wheezing or shortness of breath. Patient not taking: Reported on 10/01/2021 05/12/21   Jerrol Banana., MD  benzonatate (TESSALON) 100 MG capsule Take 1 capsule (100 mg total) by mouth 2 (two) times daily as needed for cough. Patient not taking: Reported on 11/18/2021 11/17/21   [name], NP  blood glucose meter kit and supplies Dispense based on patient and insurance preference. Use once daily as directed. (FOR ICD-10 E11.69). 01/03/20   Jerrol Banana., MD  cetirizine (ZYRTEC) 10 MG tablet Take 1 tablet (10 mg total) by mouth daily. 10/22/20   Jerrol Banana., MD  Continuous Blood Gluc Receiver (FREESTYLE LIBRE 2 READER) DEVI 1 each by Does not apply route daily. 04/22/21   Jerrol Banana., MD  Continuous Blood Gluc Sensor (FREESTYLE LIBRE 2 SENSOR)  MISC 1 each by Does not apply route daily. 04/22/21   Jerrol Banana., MD  Dulaglutide (TRULICITY) 1.5 WU/9.8JX SOPN Inject 1.5 mg into the skin once a week. 10/01/21   Jerrol Banana., MD  ferrous sulfate 325 (65 FE) MG EC tablet Take 1 tablet by mouth every morning. 09/07/21   [provider]  fluticasone (FLONASE) 50 MCG/ACT nasal spray Place 2 sprays into both nostrils daily. Patient not taking: Reported on 11/18/2021 11/17/21   [name], NP  furosemide (LASIX) 20 MG tablet Take 3 tablets (60 mg total) by mouth as directed. Takes 40 mg in AM and 20 mg in evening  09/14/21 10/14/21  Fritzi Mandes, MD  gabapentin (NEURONTIN) 100 MG capsule Take 1 capsule (100 mg total) by mouth at bedtime. 10/01/21   Jerrol Banana., MD  hydrOXYzine (VISTARIL) 25 MG capsule Take 25 mg by mouth 2 (two) times daily. 11/09/21   [provider]  JARDIANCE 25 MG TABS tablet TAKE 1 TABLET BY MOUTH DAILY 11/04/21   Jerrol Banana., MD  lactulose (CHRONULAC) 10 GM/15ML solution Take 30 mLs (20 g total) by mouth daily. 09/14/21   Fritzi Mandes, MD  loratadine (CLARITIN) 10 MG tablet Take 1 tablet (10 mg total) by mouth daily. 01/12/21   Jerrol Banana., MD  omeprazole (PRILOSEC) 20 MG capsule Take 1 capsule (20 mg total) by mouth daily. 02/16/21   Jerrol Banana., MD  ondansetron (ZOFRAN-ODT) 4 MG disintegrating tablet Take 1 tablet (4 mg total) by mouth every 8 (eight) hours as needed for nausea or vomiting. 11/18/21   Thedore Mins, Ria Comment, PA-C  ondansetron (ZOFRAN-ODT) 4 MG disintegrating tablet Take 1 tablet (4 mg total) by mouth every 8 (eight) hours as needed for nausea or vomiting. 11/18/21   Carrie Mew, MD  propranolol (INDERAL) 20 MG tablet Take 1 tablet (20 mg total) by mouth 2 (two) times daily. 01/31/20   Jerrol Banana., MD  sertraline (ZOLOFT) 50 MG tablet TAKE 1 TABLET(50 MG) BY MOUTH DAILY 11/04/21   Jerrol Banana., MD  spironolactone (ALDACTONE) 100 MG tablet TAKE ONE-HALF TABLET BY  MOUTH daily 04/01/21   Wouk, Ailene Rud, MD  TRULICITY 9.14 NW/2.9FA [facility] INJECT THE CONTENTS OF ONE  PEN SUBCUTANEOUSLY WEEKLY  AS DIRECTED Patient not taking: Reported on 11/18/2021 10/19/21   Jerrol Banana., MD    Physical Exam   Triage Vital Signs: ED Triage Vitals  Enc Vitals Group     BP 12/03/21 2352 (!) 155/60     Pulse Rate 12/03/21 2352 77     Resp 12/03/21 2352 18     Temp 12/03/21 2352 98.7 F (37.1 C)     Temp src --      SpO2 12/03/21 2352 98 %     Weight 12/03/21 2356 (!) 316 lb (143.3 kg)     Height 12/03/21 2356 6'  (1.829 m)     Head Circumference --      Peak Flow --      Pain Score 12/03/21 2355 10     Pain Loc --      Pain Edu? --      Excl. in [place]? --     Most recent vital signs: Vitals:   12/03/21 2352  BP: (!) 155/60  Pulse: 77  Resp: 18  Temp: 98.7 F (37.1 C)  SpO2: 98%     CONSTITUTIONAL: Alert and oriented and responds appropriately  to questions. Well-appearing; well-nourished; GCS 15 HEAD: Normocephalic; atraumatic EYES: Conjunctivae clear, PERRL, EOMI ENT: normal nose; no rhinorrhea; moist mucous membranes; pharynx without lesions noted; no dental injury; no septal hematoma, no epistaxis; no facial deformity or bony tenderness NECK: Supple, no midline spinal tenderness, step-off or deformity; trachea midline CARD: RRR; S1 and S2 appreciated; no murmurs, no clicks, no rubs, no gallops RESP: Normal chest excursion without splinting or tachypnea; breath sounds clear and equal bilaterally; no wheezes, no rhonchi, no rales; no hypoxia or respiratory distress CHEST:  chest wall stable, no crepitus or ecchymosis or deformity, nontender to palpation; no flail chest ABD/GI: Normal bowel sounds; non-distended; soft, non-tender, no rebound, no guarding; incision site across the abdomen intact with staples in place without any bleeding or drainage.  He has diffuse ecchymosis from recent surgery across the abdomen. PELVIS:  stable, nontender to palpation BACK:  The back appears normal; no midline spinal tenderness, step-off or deformity EXT: Bruising noted to bilateral upper extremities likely from peripheral IV placement from recent hospitalization but no tenderness or deformity noted.  He is tender to palpation over the right thigh, anterior tibia, right ankle diffusely and right dorsal foot.  He has some soft tissue swelling and ecchymosis to the right foot.  2+ right DP pulse.  Compartments soft.  No calf tenderness or calf swelling.  No joint effusion.  No obvious bony deformity noted to the  right leg.  Reports normal sensation in the right lower extremity. SKIN: Normal color for age and race; warm NEURO: No facial asymmetry, normal speech, moving all extremities equally  ED Results / Procedures / Treatments   LABS: (all labs ordered are listed, but only abnormal results are displayed) Labs Reviewed - No data to display   EKG:   RADIOLOGY: My personal review and interpretation of imaging: X-rays show nondisplaced proximal right fibular fracture.  Small displaced fracture of the proximal phalanx of the right great toe.  I have personally reviewed all radiology reports. DG Foot Complete Right  Result Date: 12/04/2021 CLINICAL DATA:  Trauma to the right lower extremity. EXAM: RIGHT FOOT COMPLETE - 3+ VIEW; RIGHT TIBIA AND FIBULA - 2 VIEW; RIGHT ANKLE - COMPLETE 3+ VIEW; RIGHT FEMUR 2 VIEWS COMPARISON:  None Available. FINDINGS: Nondisplaced fracture of the proximal fibula. There is also a displaced fracture of the plantar base of the proximal phalanx of the great toe, likely an avulsion fracture. There is a proximally 6 mm distraction gap. There is no dislocation. Mild arthritic changes of the right knee. Small suprapatellar effusion. The ankle mortise is intact. Vascular calcifications noted. There is soft tissue swelling over the dorsum of the forefoot. IMPRESSION: 1. Displaced fracture of the plantar base of the proximal phalanx of the great toe. 2. Nondisplaced fracture of the proximal fibula. 3. Soft tissue swelling over the dorsum of the forefoot. Electronically Signed   By: Anner Crete M.D.   On: 12/04/2021 01:18   DG Ankle Complete Right  Result Date: 12/04/2021 CLINICAL DATA:  Trauma to the right lower extremity. EXAM: RIGHT FOOT COMPLETE - 3+ VIEW; RIGHT TIBIA AND FIBULA - 2 VIEW; RIGHT ANKLE - COMPLETE 3+ VIEW; RIGHT FEMUR 2 VIEWS COMPARISON:  None Available. FINDINGS: Nondisplaced fracture of the proximal fibula. There is also a displaced fracture of the plantar  base of the proximal phalanx of the great toe, likely an avulsion fracture. There is a proximally 6 mm distraction gap. There is no dislocation. Mild arthritic changes of the right knee.  Small suprapatellar effusion. The ankle mortise is intact. Vascular calcifications noted. There is soft tissue swelling over the dorsum of the forefoot. IMPRESSION: 1. Displaced fracture of the plantar base of the proximal phalanx of the great toe. 2. Nondisplaced fracture of the proximal fibula. 3. Soft tissue swelling over the dorsum of the forefoot. Electronically Signed   By: Anner Crete M.D.   On: 12/04/2021 01:18   DG Tibia/Fibula Right  Result Date: 12/04/2021 CLINICAL DATA:  Trauma to the right lower extremity. EXAM: RIGHT FOOT COMPLETE - 3+ VIEW; RIGHT TIBIA AND FIBULA - 2 VIEW; RIGHT ANKLE - COMPLETE 3+ VIEW; RIGHT FEMUR 2 VIEWS COMPARISON:  None Available. FINDINGS: Nondisplaced fracture of the proximal fibula. There is also a displaced fracture of the plantar base of the proximal phalanx of the great toe, likely an avulsion fracture. There is a proximally 6 mm distraction gap. There is no dislocation. Mild arthritic changes of the right knee. Small suprapatellar effusion. The ankle mortise is intact. Vascular calcifications noted. There is soft tissue swelling over the dorsum of the forefoot. IMPRESSION: 1. Displaced fracture of the plantar base of the proximal phalanx of the great toe. 2. Nondisplaced fracture of the proximal fibula. 3. Soft tissue swelling over the dorsum of the forefoot. Electronically Signed   By: Anner Crete M.D.   On: 12/04/2021 01:18   DG FEMUR, MIN 2 VIEWS RIGHT  Result Date: 12/04/2021 CLINICAL DATA:  Trauma to the right lower extremity. EXAM: RIGHT FOOT COMPLETE - 3+ VIEW; RIGHT TIBIA AND FIBULA - 2 VIEW; RIGHT ANKLE - COMPLETE 3+ VIEW; RIGHT FEMUR 2 VIEWS COMPARISON:  None Available. FINDINGS: Nondisplaced fracture of the proximal fibula. There is also a displaced fracture of  the plantar base of the proximal phalanx of the great toe, likely an avulsion fracture. There is a proximally 6 mm distraction gap. There is no dislocation. Mild arthritic changes of the right knee. Small suprapatellar effusion. The ankle mortise is intact. Vascular calcifications noted. There is soft tissue swelling over the dorsum of the forefoot. IMPRESSION: 1. Displaced fracture of the plantar base of the proximal phalanx of the great toe. 2. Nondisplaced fracture of the proximal fibula. 3. Soft tissue swelling over the dorsum of the forefoot. Electronically Signed   By: Anner Crete M.D.   On: 12/04/2021 01:18     PROCEDURES:  Critical Care performed: No  SPLINT APPLICATION Date/Time: 1:96 AM Authorized by: Cyril Mourning Jayshaun Phillips Consent: Verbal consent obtained. Risks and benefits: risks, benefits and alternatives were discussed Consent given by: patient Splint applied by:  technician Location details: Right leg Splint type: Posterior short leg splint with stirrups Supplies used: Ortho-Glass Post-procedure: The splinted body part was neurovascularly unchanged following the procedure. Patient tolerance: Patient tolerated the procedure well with no immediate complications.     Procedures    IMPRESSION / MDM / ASSESSMENT AND PLAN / ED COURSE  I reviewed the triage vital signs and the nursing notes.  Patient here with right leg pain after mechanical fall.    DIFFERENTIAL DIAGNOSIS (includes but not limited to):   Sprain, contusion, fracture, doubt dislocation, doubt DVT.  No sign of cellulitis, arterial obstruction, compartment syndrome.  Patient's presentation is most consistent with acute presentation with potential threat to life or bodily function.  PLAN: We will obtain x-rays of the right lower extremity.  We will provide him with IM pain medication.  He denies any other injury.  Did not hit his head.  Denies any abdominal pain.  He does have diffuse ecchymosis across the  abdomen from his recent surgeries at [facility] but his incision site looks fantastic and is intact and not bleeding.   MEDICATIONS GIVEN IN ED: Medications  morphine (PF) 4 MG/ML injection 4 mg (4 mg Intramuscular Given 12/04/21 0037)     ED COURSE: X-rays reviewed/interpreted by myself and radiology.  Patient has a nondisplaced proximal fibular fracture on the right as well as a displaced fracture of the proximal phalanx of the great toe on the right.  Discussed with Dr. Roland Rack on-call for orthopedics.  He has reviewed patient's imaging.  Recommends placing him in a posterior short leg splint with stirrups, keeping him nonweightbearing and he can follow-up as an outpatient.  Patient continues to be neurovascular intact distally.  Pain well controlled after IM morphine.  Discussed supportive care instructions and return precautions.  Recommended rest, elevation and keeping the splint on, clean and dry at all times.  Given outpatient Ortho follow-up.  Patient wife comfortable with this plan.  At this time, I do not feel there is any life-threatening condition present. I reviewed all nursing notes, vitals, pertinent previous records.  All lab and urine results, EKGs, imaging ordered have been independently reviewed and interpreted by myself.  I reviewed all available radiology reports from any imaging ordered this visit.  Based on my assessment, I feel the patient is safe to be discharged home without further emergent workup and can continue workup as an outpatient as needed. Discussed all findings, treatment plan as well as usual and customary return precautions with patient and wife .  They verbalize understanding and are comfortable with this plan.  Outpatient follow-up has been provided as needed.  All questions have been answered.  CONSULTS:  Discussed with Dr. Roland Rack with orthopedics.   OUTSIDE RECORDS REVIEWED: Reviewed patient's recent admission to [facility] on 11/22/2021.       FINAL CLINICAL  IMPRESSION(S) / ED DIAGNOSES   Final diagnoses:  Fall, initial encounter  Closed traumatic nondisplaced fracture of proximal end of right fibula, initial encounter  Displaced fracture of proximal phalanx of right great toe, initial encounter for closed fracture     Rx / DC Orders   ED Discharge Orders     None        Note:  This document was prepared using Dragon voice recognition software and may include unintentional dictation errors.   Cade Olberding, Delice Bison, DO 12/04/21 [phone]

## 2021-12-04 NOTE — Discharge Instructions (Addendum)
Please take your oxycodone as prescribed.  Please keep your splint on at all times and keep it clean and dry.  I recommend keeping it elevated when at rest to help with pain and swelling.  Please follow-up with orthopedics.  You will need to be nonweightbearing on this left leg until cleared by orthopedics.

## 2021-12-15 ENCOUNTER — Ambulatory Visit: Payer: BC Managed Care – PPO | Admitting: Family Medicine

## 2021-12-21 ENCOUNTER — Telehealth: Payer: Self-pay | Admitting: Family Medicine

## 2021-12-21 NOTE — Telephone Encounter (Signed)
Please Review

## 2021-12-21 NOTE — Telephone Encounter (Signed)
Cecilia from [facility] called and Wants to know if Dr. Rosanna Randy will sign home health orders once pt discharges from the hospital / please advise

## 2021-12-23 NOTE — Telephone Encounter (Signed)
Left detailed message advising as below.

## 2021-12-25 DIAGNOSIS — Z01818 Encounter for other preprocedural examination: Secondary | ICD-10-CM | POA: Diagnosis not present

## 2021-12-25 DIAGNOSIS — D849 Immunodeficiency, unspecified: Secondary | ICD-10-CM | POA: Diagnosis not present

## 2021-12-25 DIAGNOSIS — Z792 Long term (current) use of antibiotics: Secondary | ICD-10-CM | POA: Diagnosis not present

## 2021-12-25 DIAGNOSIS — Z944 Liver transplant status: Secondary | ICD-10-CM | POA: Diagnosis not present

## 2021-12-26 DIAGNOSIS — T380X5D Adverse effect of glucocorticoids and synthetic analogues, subsequent encounter: Secondary | ICD-10-CM | POA: Diagnosis not present

## 2021-12-26 DIAGNOSIS — D631 Anemia in chronic kidney disease: Secondary | ICD-10-CM | POA: Diagnosis not present

## 2021-12-26 DIAGNOSIS — T81719D Complication of unspecified artery following a procedure, not elsewhere classified, subsequent encounter: Secondary | ICD-10-CM | POA: Diagnosis not present

## 2021-12-26 DIAGNOSIS — T8643 Liver transplant infection: Secondary | ICD-10-CM | POA: Diagnosis not present

## 2021-12-26 DIAGNOSIS — S92411D Displaced fracture of proximal phalanx of right great toe, subsequent encounter for fracture with routine healing: Secondary | ICD-10-CM | POA: Diagnosis not present

## 2021-12-26 DIAGNOSIS — L7632 Postprocedural hematoma of skin and subcutaneous tissue following other procedure: Secondary | ICD-10-CM | POA: Diagnosis not present

## 2021-12-26 DIAGNOSIS — D849 Immunodeficiency, unspecified: Secondary | ICD-10-CM | POA: Diagnosis not present

## 2021-12-26 DIAGNOSIS — E1122 Type 2 diabetes mellitus with diabetic chronic kidney disease: Secondary | ICD-10-CM | POA: Diagnosis not present

## 2021-12-26 DIAGNOSIS — S82831D Other fracture of upper and lower end of right fibula, subsequent encounter for closed fracture with routine healing: Secondary | ICD-10-CM | POA: Diagnosis not present

## 2021-12-26 DIAGNOSIS — E1165 Type 2 diabetes mellitus with hyperglycemia: Secondary | ICD-10-CM | POA: Diagnosis not present

## 2021-12-26 DIAGNOSIS — N1831 Chronic kidney disease, stage 3a: Secondary | ICD-10-CM | POA: Diagnosis not present

## 2021-12-26 DIAGNOSIS — B1910 Unspecified viral hepatitis B without hepatic coma: Secondary | ICD-10-CM | POA: Diagnosis not present

## 2021-12-26 DIAGNOSIS — D702 Other drug-induced agranulocytosis: Secondary | ICD-10-CM | POA: Diagnosis not present

## 2021-12-28 DIAGNOSIS — S82401D Unspecified fracture of shaft of right fibula, subsequent encounter for closed fracture with routine healing: Secondary | ICD-10-CM | POA: Diagnosis not present

## 2021-12-28 DIAGNOSIS — S82831A Other fracture of upper and lower end of right fibula, initial encounter for closed fracture: Secondary | ICD-10-CM | POA: Diagnosis not present

## 2021-12-28 DIAGNOSIS — W19XXXA Unspecified fall, initial encounter: Secondary | ICD-10-CM | POA: Diagnosis not present

## 2021-12-28 DIAGNOSIS — S92411D Displaced fracture of proximal phalanx of right great toe, subsequent encounter for fracture with routine healing: Secondary | ICD-10-CM | POA: Diagnosis not present

## 2021-12-30 DIAGNOSIS — N1831 Chronic kidney disease, stage 3a: Secondary | ICD-10-CM | POA: Diagnosis not present

## 2021-12-30 DIAGNOSIS — I129 Hypertensive chronic kidney disease with stage 1 through stage 4 chronic kidney disease, or unspecified chronic kidney disease: Secondary | ICD-10-CM | POA: Diagnosis not present

## 2021-12-30 DIAGNOSIS — Z792 Long term (current) use of antibiotics: Secondary | ICD-10-CM | POA: Diagnosis not present

## 2021-12-30 DIAGNOSIS — K7581 Nonalcoholic steatohepatitis (NASH): Secondary | ICD-10-CM | POA: Diagnosis not present

## 2021-12-30 DIAGNOSIS — Z944 Liver transplant status: Secondary | ICD-10-CM | POA: Diagnosis not present

## 2021-12-30 DIAGNOSIS — E1165 Type 2 diabetes mellitus with hyperglycemia: Secondary | ICD-10-CM | POA: Diagnosis not present

## 2021-12-30 DIAGNOSIS — T8643 Liver transplant infection: Secondary | ICD-10-CM | POA: Diagnosis not present

## 2021-12-30 DIAGNOSIS — E1122 Type 2 diabetes mellitus with diabetic chronic kidney disease: Secondary | ICD-10-CM | POA: Diagnosis not present

## 2021-12-30 DIAGNOSIS — K7460 Unspecified cirrhosis of liver: Secondary | ICD-10-CM | POA: Diagnosis not present

## 2021-12-30 DIAGNOSIS — G8918 Other acute postprocedural pain: Secondary | ICD-10-CM | POA: Diagnosis not present

## 2021-12-30 DIAGNOSIS — E119 Type 2 diabetes mellitus without complications: Secondary | ICD-10-CM | POA: Diagnosis not present

## 2021-12-30 DIAGNOSIS — D84821 Immunodeficiency due to drugs: Secondary | ICD-10-CM | POA: Diagnosis not present

## 2021-12-30 DIAGNOSIS — Z114 Encounter for screening for human immunodeficiency virus [HIV]: Secondary | ICD-10-CM | POA: Diagnosis not present

## 2021-12-30 DIAGNOSIS — D849 Immunodeficiency, unspecified: Secondary | ICD-10-CM | POA: Diagnosis not present

## 2021-12-30 DIAGNOSIS — Z1159 Encounter for screening for other viral diseases: Secondary | ICD-10-CM | POA: Diagnosis not present

## 2021-12-30 DIAGNOSIS — B1910 Unspecified viral hepatitis B without hepatic coma: Secondary | ICD-10-CM | POA: Diagnosis not present

## 2021-12-30 DIAGNOSIS — R7989 Other specified abnormal findings of blood chemistry: Secondary | ICD-10-CM | POA: Diagnosis not present

## 2021-12-30 DIAGNOSIS — E785 Hyperlipidemia, unspecified: Secondary | ICD-10-CM | POA: Diagnosis not present

## 2022-01-01 DIAGNOSIS — E1122 Type 2 diabetes mellitus with diabetic chronic kidney disease: Secondary | ICD-10-CM | POA: Diagnosis not present

## 2022-01-01 DIAGNOSIS — D849 Immunodeficiency, unspecified: Secondary | ICD-10-CM | POA: Diagnosis not present

## 2022-01-01 DIAGNOSIS — N1831 Chronic kidney disease, stage 3a: Secondary | ICD-10-CM | POA: Diagnosis not present

## 2022-01-01 DIAGNOSIS — T380X5D Adverse effect of glucocorticoids and synthetic analogues, subsequent encounter: Secondary | ICD-10-CM | POA: Diagnosis not present

## 2022-01-01 DIAGNOSIS — L7632 Postprocedural hematoma of skin and subcutaneous tissue following other procedure: Secondary | ICD-10-CM | POA: Diagnosis not present

## 2022-01-01 DIAGNOSIS — T81719D Complication of unspecified artery following a procedure, not elsewhere classified, subsequent encounter: Secondary | ICD-10-CM | POA: Diagnosis not present

## 2022-01-01 DIAGNOSIS — E1165 Type 2 diabetes mellitus with hyperglycemia: Secondary | ICD-10-CM | POA: Diagnosis not present

## 2022-01-01 DIAGNOSIS — S92411D Displaced fracture of proximal phalanx of right great toe, subsequent encounter for fracture with routine healing: Secondary | ICD-10-CM | POA: Diagnosis not present

## 2022-01-01 DIAGNOSIS — S82831D Other fracture of upper and lower end of right fibula, subsequent encounter for closed fracture with routine healing: Secondary | ICD-10-CM | POA: Diagnosis not present

## 2022-01-01 DIAGNOSIS — B1910 Unspecified viral hepatitis B without hepatic coma: Secondary | ICD-10-CM | POA: Diagnosis not present

## 2022-01-01 DIAGNOSIS — D631 Anemia in chronic kidney disease: Secondary | ICD-10-CM | POA: Diagnosis not present

## 2022-01-01 DIAGNOSIS — D702 Other drug-induced agranulocytosis: Secondary | ICD-10-CM | POA: Diagnosis not present

## 2022-01-01 DIAGNOSIS — T8643 Liver transplant infection: Secondary | ICD-10-CM | POA: Diagnosis not present

## 2022-01-03 DIAGNOSIS — Z4659 Encounter for fitting and adjustment of other gastrointestinal appliance and device: Secondary | ICD-10-CM | POA: Diagnosis not present

## 2022-01-03 DIAGNOSIS — L7632 Postprocedural hematoma of skin and subcutaneous tissue following other procedure: Secondary | ICD-10-CM | POA: Diagnosis not present

## 2022-01-03 DIAGNOSIS — T8189XA Other complications of procedures, not elsewhere classified, initial encounter: Secondary | ICD-10-CM | POA: Diagnosis not present

## 2022-01-03 DIAGNOSIS — K831 Obstruction of bile duct: Secondary | ICD-10-CM | POA: Diagnosis not present

## 2022-01-03 DIAGNOSIS — D84821 Immunodeficiency due to drugs: Secondary | ICD-10-CM | POA: Diagnosis not present

## 2022-01-03 DIAGNOSIS — G4733 Obstructive sleep apnea (adult) (pediatric): Secondary | ICD-10-CM | POA: Diagnosis not present

## 2022-01-03 DIAGNOSIS — E875 Hyperkalemia: Secondary | ICD-10-CM | POA: Diagnosis not present

## 2022-01-03 DIAGNOSIS — D849 Immunodeficiency, unspecified: Secondary | ICD-10-CM | POA: Diagnosis not present

## 2022-01-03 DIAGNOSIS — M7981 Nontraumatic hematoma of soft tissue: Secondary | ICD-10-CM | POA: Diagnosis not present

## 2022-01-03 DIAGNOSIS — F4323 Adjustment disorder with mixed anxiety and depressed mood: Secondary | ICD-10-CM | POA: Diagnosis not present

## 2022-01-03 DIAGNOSIS — N1831 Chronic kidney disease, stage 3a: Secondary | ICD-10-CM | POA: Diagnosis not present

## 2022-01-03 DIAGNOSIS — R7989 Other specified abnormal findings of blood chemistry: Secondary | ICD-10-CM | POA: Diagnosis not present

## 2022-01-03 DIAGNOSIS — G4700 Insomnia, unspecified: Secondary | ICD-10-CM | POA: Diagnosis not present

## 2022-01-03 DIAGNOSIS — Z903 Acquired absence of stomach [part of]: Secondary | ICD-10-CM | POA: Diagnosis not present

## 2022-01-03 DIAGNOSIS — Z6841 Body Mass Index (BMI) 40.0 and over, adult: Secondary | ICD-10-CM | POA: Diagnosis not present

## 2022-01-03 DIAGNOSIS — T8649 Other complications of liver transplant: Secondary | ICD-10-CM | POA: Diagnosis not present

## 2022-01-03 DIAGNOSIS — E8770 Fluid overload, unspecified: Secondary | ICD-10-CM | POA: Diagnosis not present

## 2022-01-03 DIAGNOSIS — Z91040 Latex allergy status: Secondary | ICD-10-CM | POA: Diagnosis not present

## 2022-01-03 DIAGNOSIS — Z888 Allergy status to other drugs, medicaments and biological substances status: Secondary | ICD-10-CM | POA: Diagnosis not present

## 2022-01-03 DIAGNOSIS — E1122 Type 2 diabetes mellitus with diabetic chronic kidney disease: Secondary | ICD-10-CM | POA: Diagnosis not present

## 2022-01-03 DIAGNOSIS — S36112S Contusion of liver, sequela: Secondary | ICD-10-CM | POA: Diagnosis not present

## 2022-01-03 DIAGNOSIS — F419 Anxiety disorder, unspecified: Secondary | ICD-10-CM | POA: Diagnosis not present

## 2022-01-03 DIAGNOSIS — N179 Acute kidney failure, unspecified: Secondary | ICD-10-CM | POA: Diagnosis not present

## 2022-01-03 DIAGNOSIS — K766 Portal hypertension: Secondary | ICD-10-CM | POA: Diagnosis not present

## 2022-01-03 DIAGNOSIS — Z79899 Other long term (current) drug therapy: Secondary | ICD-10-CM | POA: Diagnosis not present

## 2022-01-03 DIAGNOSIS — R748 Abnormal levels of other serum enzymes: Secondary | ICD-10-CM | POA: Diagnosis not present

## 2022-01-03 DIAGNOSIS — Z944 Liver transplant status: Secondary | ICD-10-CM | POA: Diagnosis not present

## 2022-01-07 ENCOUNTER — Ambulatory Visit: Payer: BC Managed Care – PPO | Admitting: Pulmonary Disease

## 2022-01-10 DIAGNOSIS — Z944 Liver transplant status: Secondary | ICD-10-CM | POA: Diagnosis not present

## 2022-01-10 DIAGNOSIS — L7632 Postprocedural hematoma of skin and subcutaneous tissue following other procedure: Secondary | ICD-10-CM | POA: Diagnosis not present

## 2022-01-10 DIAGNOSIS — S92411D Displaced fracture of proximal phalanx of right great toe, subsequent encounter for fracture with routine healing: Secondary | ICD-10-CM | POA: Diagnosis not present

## 2022-01-10 DIAGNOSIS — B1910 Unspecified viral hepatitis B without hepatic coma: Secondary | ICD-10-CM | POA: Diagnosis not present

## 2022-01-10 DIAGNOSIS — D849 Immunodeficiency, unspecified: Secondary | ICD-10-CM | POA: Diagnosis not present

## 2022-01-10 DIAGNOSIS — E1122 Type 2 diabetes mellitus with diabetic chronic kidney disease: Secondary | ICD-10-CM | POA: Diagnosis not present

## 2022-01-10 DIAGNOSIS — T8643 Liver transplant infection: Secondary | ICD-10-CM | POA: Diagnosis not present

## 2022-01-10 DIAGNOSIS — N1831 Chronic kidney disease, stage 3a: Secondary | ICD-10-CM | POA: Diagnosis not present

## 2022-01-10 DIAGNOSIS — E1165 Type 2 diabetes mellitus with hyperglycemia: Secondary | ICD-10-CM | POA: Diagnosis not present

## 2022-01-10 DIAGNOSIS — D702 Other drug-induced agranulocytosis: Secondary | ICD-10-CM | POA: Diagnosis not present

## 2022-01-10 DIAGNOSIS — T81719D Complication of unspecified artery following a procedure, not elsewhere classified, subsequent encounter: Secondary | ICD-10-CM | POA: Diagnosis not present

## 2022-01-10 DIAGNOSIS — T380X5D Adverse effect of glucocorticoids and synthetic analogues, subsequent encounter: Secondary | ICD-10-CM | POA: Diagnosis not present

## 2022-01-10 DIAGNOSIS — S82831D Other fracture of upper and lower end of right fibula, subsequent encounter for closed fracture with routine healing: Secondary | ICD-10-CM | POA: Diagnosis not present

## 2022-01-10 DIAGNOSIS — D631 Anemia in chronic kidney disease: Secondary | ICD-10-CM | POA: Diagnosis not present

## 2022-01-12 DIAGNOSIS — T8643 Liver transplant infection: Secondary | ICD-10-CM | POA: Diagnosis not present

## 2022-01-12 DIAGNOSIS — T81719D Complication of unspecified artery following a procedure, not elsewhere classified, subsequent encounter: Secondary | ICD-10-CM | POA: Diagnosis not present

## 2022-01-12 DIAGNOSIS — T380X5D Adverse effect of glucocorticoids and synthetic analogues, subsequent encounter: Secondary | ICD-10-CM | POA: Diagnosis not present

## 2022-01-12 DIAGNOSIS — L7632 Postprocedural hematoma of skin and subcutaneous tissue following other procedure: Secondary | ICD-10-CM | POA: Diagnosis not present

## 2022-01-12 DIAGNOSIS — R7989 Other specified abnormal findings of blood chemistry: Secondary | ICD-10-CM | POA: Diagnosis not present

## 2022-01-12 DIAGNOSIS — Z944 Liver transplant status: Secondary | ICD-10-CM | POA: Diagnosis not present

## 2022-01-12 DIAGNOSIS — D849 Immunodeficiency, unspecified: Secondary | ICD-10-CM | POA: Diagnosis not present

## 2022-01-12 DIAGNOSIS — E1122 Type 2 diabetes mellitus with diabetic chronic kidney disease: Secondary | ICD-10-CM | POA: Diagnosis not present

## 2022-01-12 DIAGNOSIS — N1831 Chronic kidney disease, stage 3a: Secondary | ICD-10-CM | POA: Diagnosis not present

## 2022-01-12 DIAGNOSIS — S92411D Displaced fracture of proximal phalanx of right great toe, subsequent encounter for fracture with routine healing: Secondary | ICD-10-CM | POA: Diagnosis not present

## 2022-01-12 DIAGNOSIS — E1165 Type 2 diabetes mellitus with hyperglycemia: Secondary | ICD-10-CM | POA: Diagnosis not present

## 2022-01-12 DIAGNOSIS — D631 Anemia in chronic kidney disease: Secondary | ICD-10-CM | POA: Diagnosis not present

## 2022-01-12 DIAGNOSIS — D702 Other drug-induced agranulocytosis: Secondary | ICD-10-CM | POA: Diagnosis not present

## 2022-01-12 DIAGNOSIS — B1910 Unspecified viral hepatitis B without hepatic coma: Secondary | ICD-10-CM | POA: Diagnosis not present

## 2022-01-12 DIAGNOSIS — S82831D Other fracture of upper and lower end of right fibula, subsequent encounter for closed fracture with routine healing: Secondary | ICD-10-CM | POA: Diagnosis not present

## 2022-01-13 DIAGNOSIS — D849 Immunodeficiency, unspecified: Secondary | ICD-10-CM | POA: Diagnosis not present

## 2022-01-13 DIAGNOSIS — D702 Other drug-induced agranulocytosis: Secondary | ICD-10-CM | POA: Diagnosis not present

## 2022-01-13 DIAGNOSIS — F419 Anxiety disorder, unspecified: Secondary | ICD-10-CM | POA: Diagnosis not present

## 2022-01-13 DIAGNOSIS — E119 Type 2 diabetes mellitus without complications: Secondary | ICD-10-CM | POA: Diagnosis not present

## 2022-01-13 DIAGNOSIS — Z944 Liver transplant status: Secondary | ICD-10-CM | POA: Diagnosis not present

## 2022-01-13 DIAGNOSIS — D649 Anemia, unspecified: Secondary | ICD-10-CM | POA: Diagnosis not present

## 2022-01-13 DIAGNOSIS — Z792 Long term (current) use of antibiotics: Secondary | ICD-10-CM | POA: Diagnosis not present

## 2022-01-15 DIAGNOSIS — T8643 Liver transplant infection: Secondary | ICD-10-CM | POA: Diagnosis not present

## 2022-01-15 DIAGNOSIS — E1122 Type 2 diabetes mellitus with diabetic chronic kidney disease: Secondary | ICD-10-CM | POA: Diagnosis not present

## 2022-01-15 DIAGNOSIS — T380X5D Adverse effect of glucocorticoids and synthetic analogues, subsequent encounter: Secondary | ICD-10-CM | POA: Diagnosis not present

## 2022-01-15 DIAGNOSIS — L7632 Postprocedural hematoma of skin and subcutaneous tissue following other procedure: Secondary | ICD-10-CM | POA: Diagnosis not present

## 2022-01-15 DIAGNOSIS — F419 Anxiety disorder, unspecified: Secondary | ICD-10-CM | POA: Diagnosis not present

## 2022-01-15 DIAGNOSIS — Z794 Long term (current) use of insulin: Secondary | ICD-10-CM | POA: Diagnosis not present

## 2022-01-15 DIAGNOSIS — B1910 Unspecified viral hepatitis B without hepatic coma: Secondary | ICD-10-CM | POA: Diagnosis not present

## 2022-01-15 DIAGNOSIS — F39 Unspecified mood [affective] disorder: Secondary | ICD-10-CM | POA: Diagnosis not present

## 2022-01-15 DIAGNOSIS — D702 Other drug-induced agranulocytosis: Secondary | ICD-10-CM | POA: Diagnosis not present

## 2022-01-15 DIAGNOSIS — T81719D Complication of unspecified artery following a procedure, not elsewhere classified, subsequent encounter: Secondary | ICD-10-CM | POA: Diagnosis not present

## 2022-01-15 DIAGNOSIS — D631 Anemia in chronic kidney disease: Secondary | ICD-10-CM | POA: Diagnosis not present

## 2022-01-15 DIAGNOSIS — N1831 Chronic kidney disease, stage 3a: Secondary | ICD-10-CM | POA: Diagnosis not present

## 2022-01-15 DIAGNOSIS — D849 Immunodeficiency, unspecified: Secondary | ICD-10-CM | POA: Diagnosis not present

## 2022-01-15 DIAGNOSIS — E1165 Type 2 diabetes mellitus with hyperglycemia: Secondary | ICD-10-CM | POA: Diagnosis not present

## 2022-01-15 DIAGNOSIS — S92411D Displaced fracture of proximal phalanx of right great toe, subsequent encounter for fracture with routine healing: Secondary | ICD-10-CM | POA: Diagnosis not present

## 2022-01-15 DIAGNOSIS — Z944 Liver transplant status: Secondary | ICD-10-CM | POA: Diagnosis not present

## 2022-01-15 DIAGNOSIS — S82831D Other fracture of upper and lower end of right fibula, subsequent encounter for closed fracture with routine healing: Secondary | ICD-10-CM | POA: Diagnosis not present

## 2022-01-18 DIAGNOSIS — E1122 Type 2 diabetes mellitus with diabetic chronic kidney disease: Secondary | ICD-10-CM | POA: Diagnosis not present

## 2022-01-18 DIAGNOSIS — N1831 Chronic kidney disease, stage 3a: Secondary | ICD-10-CM | POA: Diagnosis not present

## 2022-01-18 DIAGNOSIS — D631 Anemia in chronic kidney disease: Secondary | ICD-10-CM | POA: Diagnosis not present

## 2022-01-18 DIAGNOSIS — T81719D Complication of unspecified artery following a procedure, not elsewhere classified, subsequent encounter: Secondary | ICD-10-CM | POA: Diagnosis not present

## 2022-01-18 DIAGNOSIS — T8643 Liver transplant infection: Secondary | ICD-10-CM | POA: Diagnosis not present

## 2022-01-18 DIAGNOSIS — L7632 Postprocedural hematoma of skin and subcutaneous tissue following other procedure: Secondary | ICD-10-CM | POA: Diagnosis not present

## 2022-01-18 DIAGNOSIS — S92411D Displaced fracture of proximal phalanx of right great toe, subsequent encounter for fracture with routine healing: Secondary | ICD-10-CM | POA: Diagnosis not present

## 2022-01-18 DIAGNOSIS — S82831D Other fracture of upper and lower end of right fibula, subsequent encounter for closed fracture with routine healing: Secondary | ICD-10-CM | POA: Diagnosis not present

## 2022-01-18 DIAGNOSIS — D849 Immunodeficiency, unspecified: Secondary | ICD-10-CM | POA: Diagnosis not present

## 2022-01-18 DIAGNOSIS — T380X5D Adverse effect of glucocorticoids and synthetic analogues, subsequent encounter: Secondary | ICD-10-CM | POA: Diagnosis not present

## 2022-01-18 DIAGNOSIS — B1910 Unspecified viral hepatitis B without hepatic coma: Secondary | ICD-10-CM | POA: Diagnosis not present

## 2022-01-18 DIAGNOSIS — E1165 Type 2 diabetes mellitus with hyperglycemia: Secondary | ICD-10-CM | POA: Diagnosis not present

## 2022-01-18 DIAGNOSIS — D702 Other drug-induced agranulocytosis: Secondary | ICD-10-CM | POA: Diagnosis not present

## 2022-01-19 DIAGNOSIS — N1831 Chronic kidney disease, stage 3a: Secondary | ICD-10-CM | POA: Diagnosis not present

## 2022-01-19 DIAGNOSIS — S92411D Displaced fracture of proximal phalanx of right great toe, subsequent encounter for fracture with routine healing: Secondary | ICD-10-CM | POA: Diagnosis not present

## 2022-01-19 DIAGNOSIS — D631 Anemia in chronic kidney disease: Secondary | ICD-10-CM | POA: Diagnosis not present

## 2022-01-19 DIAGNOSIS — E1122 Type 2 diabetes mellitus with diabetic chronic kidney disease: Secondary | ICD-10-CM | POA: Diagnosis not present

## 2022-01-19 DIAGNOSIS — T8643 Liver transplant infection: Secondary | ICD-10-CM | POA: Diagnosis not present

## 2022-01-19 DIAGNOSIS — T380X5D Adverse effect of glucocorticoids and synthetic analogues, subsequent encounter: Secondary | ICD-10-CM | POA: Diagnosis not present

## 2022-01-19 DIAGNOSIS — D849 Immunodeficiency, unspecified: Secondary | ICD-10-CM | POA: Diagnosis not present

## 2022-01-19 DIAGNOSIS — E1165 Type 2 diabetes mellitus with hyperglycemia: Secondary | ICD-10-CM | POA: Diagnosis not present

## 2022-01-19 DIAGNOSIS — T81719D Complication of unspecified artery following a procedure, not elsewhere classified, subsequent encounter: Secondary | ICD-10-CM | POA: Diagnosis not present

## 2022-01-19 DIAGNOSIS — D702 Other drug-induced agranulocytosis: Secondary | ICD-10-CM | POA: Diagnosis not present

## 2022-01-19 DIAGNOSIS — B1910 Unspecified viral hepatitis B without hepatic coma: Secondary | ICD-10-CM | POA: Diagnosis not present

## 2022-01-19 DIAGNOSIS — L7632 Postprocedural hematoma of skin and subcutaneous tissue following other procedure: Secondary | ICD-10-CM | POA: Diagnosis not present

## 2022-01-19 DIAGNOSIS — S82831D Other fracture of upper and lower end of right fibula, subsequent encounter for closed fracture with routine healing: Secondary | ICD-10-CM | POA: Diagnosis not present

## 2022-01-20 DIAGNOSIS — T380X5D Adverse effect of glucocorticoids and synthetic analogues, subsequent encounter: Secondary | ICD-10-CM | POA: Diagnosis not present

## 2022-01-20 DIAGNOSIS — E1122 Type 2 diabetes mellitus with diabetic chronic kidney disease: Secondary | ICD-10-CM | POA: Diagnosis not present

## 2022-01-20 DIAGNOSIS — N1831 Chronic kidney disease, stage 3a: Secondary | ICD-10-CM | POA: Diagnosis not present

## 2022-01-20 DIAGNOSIS — D849 Immunodeficiency, unspecified: Secondary | ICD-10-CM | POA: Diagnosis not present

## 2022-01-20 DIAGNOSIS — T8643 Liver transplant infection: Secondary | ICD-10-CM | POA: Diagnosis not present

## 2022-01-20 DIAGNOSIS — S82831D Other fracture of upper and lower end of right fibula, subsequent encounter for closed fracture with routine healing: Secondary | ICD-10-CM | POA: Diagnosis not present

## 2022-01-20 DIAGNOSIS — T8131XD Disruption of external operation (surgical) wound, not elsewhere classified, subsequent encounter: Secondary | ICD-10-CM | POA: Diagnosis not present

## 2022-01-20 DIAGNOSIS — E1165 Type 2 diabetes mellitus with hyperglycemia: Secondary | ICD-10-CM | POA: Diagnosis not present

## 2022-01-20 DIAGNOSIS — S92411D Displaced fracture of proximal phalanx of right great toe, subsequent encounter for fracture with routine healing: Secondary | ICD-10-CM | POA: Diagnosis not present

## 2022-01-20 DIAGNOSIS — Y830 Surgical operation with transplant of whole organ as the cause of abnormal reaction of the patient, or of later complication, without mention of misadventure at the time of the procedure: Secondary | ICD-10-CM | POA: Diagnosis not present

## 2022-01-20 DIAGNOSIS — D702 Other drug-induced agranulocytosis: Secondary | ICD-10-CM | POA: Diagnosis not present

## 2022-01-20 DIAGNOSIS — L7632 Postprocedural hematoma of skin and subcutaneous tissue following other procedure: Secondary | ICD-10-CM | POA: Diagnosis not present

## 2022-01-20 DIAGNOSIS — D631 Anemia in chronic kidney disease: Secondary | ICD-10-CM | POA: Diagnosis not present

## 2022-01-20 DIAGNOSIS — T81719D Complication of unspecified artery following a procedure, not elsewhere classified, subsequent encounter: Secondary | ICD-10-CM | POA: Diagnosis not present

## 2022-01-20 DIAGNOSIS — B1910 Unspecified viral hepatitis B without hepatic coma: Secondary | ICD-10-CM | POA: Diagnosis not present

## 2022-01-21 DIAGNOSIS — N1831 Chronic kidney disease, stage 3a: Secondary | ICD-10-CM | POA: Diagnosis not present

## 2022-01-21 DIAGNOSIS — T8643 Liver transplant infection: Secondary | ICD-10-CM | POA: Diagnosis not present

## 2022-01-21 DIAGNOSIS — D631 Anemia in chronic kidney disease: Secondary | ICD-10-CM | POA: Diagnosis not present

## 2022-01-21 DIAGNOSIS — B1910 Unspecified viral hepatitis B without hepatic coma: Secondary | ICD-10-CM | POA: Diagnosis not present

## 2022-01-21 DIAGNOSIS — T81719D Complication of unspecified artery following a procedure, not elsewhere classified, subsequent encounter: Secondary | ICD-10-CM | POA: Diagnosis not present

## 2022-01-21 DIAGNOSIS — F54 Psychological and behavioral factors associated with disorders or diseases classified elsewhere: Secondary | ICD-10-CM | POA: Diagnosis not present

## 2022-01-21 DIAGNOSIS — S92411D Displaced fracture of proximal phalanx of right great toe, subsequent encounter for fracture with routine healing: Secondary | ICD-10-CM | POA: Diagnosis not present

## 2022-01-21 DIAGNOSIS — F419 Anxiety disorder, unspecified: Secondary | ICD-10-CM | POA: Diagnosis not present

## 2022-01-21 DIAGNOSIS — E1165 Type 2 diabetes mellitus with hyperglycemia: Secondary | ICD-10-CM | POA: Diagnosis not present

## 2022-01-21 DIAGNOSIS — E1122 Type 2 diabetes mellitus with diabetic chronic kidney disease: Secondary | ICD-10-CM | POA: Diagnosis not present

## 2022-01-21 DIAGNOSIS — L7632 Postprocedural hematoma of skin and subcutaneous tissue following other procedure: Secondary | ICD-10-CM | POA: Diagnosis not present

## 2022-01-21 DIAGNOSIS — T380X5D Adverse effect of glucocorticoids and synthetic analogues, subsequent encounter: Secondary | ICD-10-CM | POA: Diagnosis not present

## 2022-01-21 DIAGNOSIS — D849 Immunodeficiency, unspecified: Secondary | ICD-10-CM | POA: Diagnosis not present

## 2022-01-21 DIAGNOSIS — S82831D Other fracture of upper and lower end of right fibula, subsequent encounter for closed fracture with routine healing: Secondary | ICD-10-CM | POA: Diagnosis not present

## 2022-01-21 DIAGNOSIS — D702 Other drug-induced agranulocytosis: Secondary | ICD-10-CM | POA: Diagnosis not present

## 2022-01-22 DIAGNOSIS — D702 Other drug-induced agranulocytosis: Secondary | ICD-10-CM | POA: Diagnosis not present

## 2022-01-22 DIAGNOSIS — N1831 Chronic kidney disease, stage 3a: Secondary | ICD-10-CM | POA: Diagnosis not present

## 2022-01-22 DIAGNOSIS — S82831D Other fracture of upper and lower end of right fibula, subsequent encounter for closed fracture with routine healing: Secondary | ICD-10-CM | POA: Diagnosis not present

## 2022-01-22 DIAGNOSIS — N183 Chronic kidney disease, stage 3 unspecified: Secondary | ICD-10-CM | POA: Diagnosis not present

## 2022-01-22 DIAGNOSIS — L7632 Postprocedural hematoma of skin and subcutaneous tissue following other procedure: Secondary | ICD-10-CM | POA: Diagnosis not present

## 2022-01-22 DIAGNOSIS — B1910 Unspecified viral hepatitis B without hepatic coma: Secondary | ICD-10-CM | POA: Diagnosis not present

## 2022-01-22 DIAGNOSIS — T8643 Liver transplant infection: Secondary | ICD-10-CM | POA: Diagnosis not present

## 2022-01-22 DIAGNOSIS — E1165 Type 2 diabetes mellitus with hyperglycemia: Secondary | ICD-10-CM | POA: Diagnosis not present

## 2022-01-22 DIAGNOSIS — T81719D Complication of unspecified artery following a procedure, not elsewhere classified, subsequent encounter: Secondary | ICD-10-CM | POA: Diagnosis not present

## 2022-01-22 DIAGNOSIS — T380X5D Adverse effect of glucocorticoids and synthetic analogues, subsequent encounter: Secondary | ICD-10-CM | POA: Diagnosis not present

## 2022-01-22 DIAGNOSIS — E1122 Type 2 diabetes mellitus with diabetic chronic kidney disease: Secondary | ICD-10-CM | POA: Diagnosis not present

## 2022-01-22 DIAGNOSIS — S92411D Displaced fracture of proximal phalanx of right great toe, subsequent encounter for fracture with routine healing: Secondary | ICD-10-CM | POA: Diagnosis not present

## 2022-01-22 DIAGNOSIS — D849 Immunodeficiency, unspecified: Secondary | ICD-10-CM | POA: Diagnosis not present

## 2022-01-22 DIAGNOSIS — D631 Anemia in chronic kidney disease: Secondary | ICD-10-CM | POA: Diagnosis not present

## 2022-01-24 DIAGNOSIS — T8189XA Other complications of procedures, not elsewhere classified, initial encounter: Secondary | ICD-10-CM | POA: Diagnosis not present

## 2022-01-24 DIAGNOSIS — G4733 Obstructive sleep apnea (adult) (pediatric): Secondary | ICD-10-CM | POA: Diagnosis not present

## 2022-01-25 DIAGNOSIS — S82831D Other fracture of upper and lower end of right fibula, subsequent encounter for closed fracture with routine healing: Secondary | ICD-10-CM | POA: Diagnosis not present

## 2022-01-25 DIAGNOSIS — Z944 Liver transplant status: Secondary | ICD-10-CM | POA: Diagnosis not present

## 2022-01-25 DIAGNOSIS — T8643 Liver transplant infection: Secondary | ICD-10-CM | POA: Diagnosis not present

## 2022-01-25 DIAGNOSIS — T81719D Complication of unspecified artery following a procedure, not elsewhere classified, subsequent encounter: Secondary | ICD-10-CM | POA: Diagnosis not present

## 2022-01-25 DIAGNOSIS — B1910 Unspecified viral hepatitis B without hepatic coma: Secondary | ICD-10-CM | POA: Diagnosis not present

## 2022-01-25 DIAGNOSIS — R600 Localized edema: Secondary | ICD-10-CM | POA: Diagnosis not present

## 2022-01-25 DIAGNOSIS — E1122 Type 2 diabetes mellitus with diabetic chronic kidney disease: Secondary | ICD-10-CM | POA: Diagnosis not present

## 2022-01-25 DIAGNOSIS — D849 Immunodeficiency, unspecified: Secondary | ICD-10-CM | POA: Diagnosis not present

## 2022-01-25 DIAGNOSIS — D631 Anemia in chronic kidney disease: Secondary | ICD-10-CM | POA: Diagnosis not present

## 2022-01-25 DIAGNOSIS — R748 Abnormal levels of other serum enzymes: Secondary | ICD-10-CM | POA: Diagnosis not present

## 2022-01-25 DIAGNOSIS — S92411D Displaced fracture of proximal phalanx of right great toe, subsequent encounter for fracture with routine healing: Secondary | ICD-10-CM | POA: Diagnosis not present

## 2022-01-25 DIAGNOSIS — D702 Other drug-induced agranulocytosis: Secondary | ICD-10-CM | POA: Diagnosis not present

## 2022-01-25 DIAGNOSIS — T380X5D Adverse effect of glucocorticoids and synthetic analogues, subsequent encounter: Secondary | ICD-10-CM | POA: Diagnosis not present

## 2022-01-25 DIAGNOSIS — N1831 Chronic kidney disease, stage 3a: Secondary | ICD-10-CM | POA: Diagnosis not present

## 2022-01-25 DIAGNOSIS — T8189XA Other complications of procedures, not elsewhere classified, initial encounter: Secondary | ICD-10-CM | POA: Diagnosis not present

## 2022-01-25 DIAGNOSIS — E1165 Type 2 diabetes mellitus with hyperglycemia: Secondary | ICD-10-CM | POA: Diagnosis not present

## 2022-01-25 DIAGNOSIS — L7632 Postprocedural hematoma of skin and subcutaneous tissue following other procedure: Secondary | ICD-10-CM | POA: Diagnosis not present

## 2022-01-26 DIAGNOSIS — D6489 Other specified anemias: Secondary | ICD-10-CM | POA: Diagnosis not present

## 2022-01-26 DIAGNOSIS — T8189XA Other complications of procedures, not elsewhere classified, initial encounter: Secondary | ICD-10-CM | POA: Diagnosis not present

## 2022-01-26 DIAGNOSIS — N281 Cyst of kidney, acquired: Secondary | ICD-10-CM | POA: Diagnosis not present

## 2022-01-26 DIAGNOSIS — A084 Viral intestinal infection, unspecified: Secondary | ICD-10-CM | POA: Diagnosis not present

## 2022-01-26 DIAGNOSIS — Z9884 Bariatric surgery status: Secondary | ICD-10-CM | POA: Diagnosis not present

## 2022-01-26 DIAGNOSIS — Z794 Long term (current) use of insulin: Secondary | ICD-10-CM | POA: Diagnosis not present

## 2022-01-26 DIAGNOSIS — J90 Pleural effusion, not elsewhere classified: Secondary | ICD-10-CM | POA: Diagnosis not present

## 2022-01-26 DIAGNOSIS — E872 Acidosis, unspecified: Secondary | ICD-10-CM | POA: Diagnosis not present

## 2022-01-26 DIAGNOSIS — Z944 Liver transplant status: Secondary | ICD-10-CM | POA: Diagnosis not present

## 2022-01-26 DIAGNOSIS — N179 Acute kidney failure, unspecified: Secondary | ICD-10-CM | POA: Diagnosis not present

## 2022-01-26 DIAGNOSIS — Z79624 Long term (current) use of inhibitors of nucleotide synthesis: Secondary | ICD-10-CM | POA: Diagnosis not present

## 2022-01-26 DIAGNOSIS — Z7982 Long term (current) use of aspirin: Secondary | ICD-10-CM | POA: Diagnosis not present

## 2022-01-26 DIAGNOSIS — R197 Diarrhea, unspecified: Secondary | ICD-10-CM | POA: Diagnosis not present

## 2022-01-26 DIAGNOSIS — E1122 Type 2 diabetes mellitus with diabetic chronic kidney disease: Secondary | ICD-10-CM | POA: Diagnosis not present

## 2022-01-26 DIAGNOSIS — Z7952 Long term (current) use of systemic steroids: Secondary | ICD-10-CM | POA: Diagnosis not present

## 2022-01-26 DIAGNOSIS — Z91041 Radiographic dye allergy status: Secondary | ICD-10-CM | POA: Diagnosis not present

## 2022-01-26 DIAGNOSIS — H905 Unspecified sensorineural hearing loss: Secondary | ICD-10-CM | POA: Diagnosis not present

## 2022-01-26 DIAGNOSIS — W19XXXA Unspecified fall, initial encounter: Secondary | ICD-10-CM | POA: Diagnosis not present

## 2022-01-26 DIAGNOSIS — R55 Syncope and collapse: Secondary | ICD-10-CM | POA: Diagnosis not present

## 2022-01-26 DIAGNOSIS — Z91040 Latex allergy status: Secondary | ICD-10-CM | POA: Diagnosis not present

## 2022-01-26 DIAGNOSIS — R112 Nausea with vomiting, unspecified: Secondary | ICD-10-CM | POA: Diagnosis not present

## 2022-01-26 DIAGNOSIS — N1831 Chronic kidney disease, stage 3a: Secondary | ICD-10-CM | POA: Diagnosis not present

## 2022-01-26 DIAGNOSIS — D702 Other drug-induced agranulocytosis: Secondary | ICD-10-CM | POA: Diagnosis not present

## 2022-01-26 DIAGNOSIS — Z79621 Long term (current) use of calcineurin inhibitor: Secondary | ICD-10-CM | POA: Diagnosis not present

## 2022-01-30 DIAGNOSIS — T8189XA Other complications of procedures, not elsewhere classified, initial encounter: Secondary | ICD-10-CM | POA: Diagnosis not present

## 2022-01-31 DIAGNOSIS — T8189XA Other complications of procedures, not elsewhere classified, initial encounter: Secondary | ICD-10-CM | POA: Diagnosis not present

## 2022-02-01 ENCOUNTER — Ambulatory Visit (INDEPENDENT_AMBULATORY_CARE_PROVIDER_SITE_OTHER): Payer: BC Managed Care – PPO | Admitting: Family Medicine

## 2022-02-01 VITALS — BP 133/67 | HR 62 | Temp 98.3°F

## 2022-02-01 DIAGNOSIS — T81719D Complication of unspecified artery following a procedure, not elsewhere classified, subsequent encounter: Secondary | ICD-10-CM | POA: Diagnosis not present

## 2022-02-01 DIAGNOSIS — K7290 Hepatic failure, unspecified without coma: Secondary | ICD-10-CM | POA: Diagnosis not present

## 2022-02-01 DIAGNOSIS — N1832 Chronic kidney disease, stage 3b: Secondary | ICD-10-CM | POA: Diagnosis not present

## 2022-02-01 DIAGNOSIS — R609 Edema, unspecified: Secondary | ICD-10-CM | POA: Diagnosis not present

## 2022-02-01 DIAGNOSIS — D649 Anemia, unspecified: Secondary | ICD-10-CM

## 2022-02-01 DIAGNOSIS — T8643 Liver transplant infection: Secondary | ICD-10-CM | POA: Diagnosis not present

## 2022-02-01 DIAGNOSIS — K7460 Unspecified cirrhosis of liver: Secondary | ICD-10-CM

## 2022-02-01 DIAGNOSIS — N1831 Chronic kidney disease, stage 3a: Secondary | ICD-10-CM | POA: Diagnosis not present

## 2022-02-01 DIAGNOSIS — Z944 Liver transplant status: Secondary | ICD-10-CM | POA: Diagnosis not present

## 2022-02-01 DIAGNOSIS — Z6841 Body Mass Index (BMI) 40.0 and over, adult: Secondary | ICD-10-CM

## 2022-02-01 DIAGNOSIS — B1910 Unspecified viral hepatitis B without hepatic coma: Secondary | ICD-10-CM | POA: Diagnosis not present

## 2022-02-01 DIAGNOSIS — T380X5D Adverse effect of glucocorticoids and synthetic analogues, subsequent encounter: Secondary | ICD-10-CM | POA: Diagnosis not present

## 2022-02-01 DIAGNOSIS — S82831D Other fracture of upper and lower end of right fibula, subsequent encounter for closed fracture with routine healing: Secondary | ICD-10-CM | POA: Diagnosis not present

## 2022-02-01 DIAGNOSIS — S92411D Displaced fracture of proximal phalanx of right great toe, subsequent encounter for fracture with routine healing: Secondary | ICD-10-CM | POA: Diagnosis not present

## 2022-02-01 DIAGNOSIS — T8189XA Other complications of procedures, not elsewhere classified, initial encounter: Secondary | ICD-10-CM | POA: Diagnosis not present

## 2022-02-01 DIAGNOSIS — E1122 Type 2 diabetes mellitus with diabetic chronic kidney disease: Secondary | ICD-10-CM | POA: Diagnosis not present

## 2022-02-01 DIAGNOSIS — D702 Other drug-induced agranulocytosis: Secondary | ICD-10-CM | POA: Diagnosis not present

## 2022-02-01 DIAGNOSIS — L7632 Postprocedural hematoma of skin and subcutaneous tissue following other procedure: Secondary | ICD-10-CM | POA: Diagnosis not present

## 2022-02-01 DIAGNOSIS — G4733 Obstructive sleep apnea (adult) (pediatric): Secondary | ICD-10-CM

## 2022-02-01 DIAGNOSIS — D631 Anemia in chronic kidney disease: Secondary | ICD-10-CM | POA: Diagnosis not present

## 2022-02-01 DIAGNOSIS — E1165 Type 2 diabetes mellitus with hyperglycemia: Secondary | ICD-10-CM | POA: Diagnosis not present

## 2022-02-01 DIAGNOSIS — D849 Immunodeficiency, unspecified: Secondary | ICD-10-CM | POA: Diagnosis not present

## 2022-02-01 MED ORDER — FUROSEMIDE 40 MG PO TABS: 40.0000 mg | ORAL_TABLET | Freq: Every day | ORAL | 4 refills | Status: DC

## 2022-02-01 NOTE — Progress Notes (Unsigned)
Established patient visit   Patient: Jesse Zimmerman   DOB: 03-21-1961   61 y.o. Male  MRN: [id number] Visit Date: 02/01/2022  Today's healthcare provider: Wilhemena Durie, MD   No chief complaint on file.  Subjective    HPI  Patient had liver transplant 01/26/2022.  Patient was recently re-admitted to the hospital for dehydration secondary to a possible GI bug.  Last week he had episodes of explosive diarrhea.  On 11/26/21 he had an episode that caused him to pass out. Per the patient's wife he had a temperature of 101 when admitted into the hospital. Per the patient they gave him fluids and blood transfusions while in the hospital.   He states that he still feels horrible.  The stopped his diuretic at the time of his admission and told him to stay off of it.  Patient was discharged on 01/29/22. He feels his feet and legs are very swollen and he is unable to sleep in bed and is having to stay in a recliner.  When the home health nurse came to check on him she told him that he didn't have his legs up high enough.  He states that if he puts them up any higher it causes too much chest pressure and he isn't able to breath.   By his scales he has gained about 15 pounds of fluid weight over the past 2 weeks.  He would like to get back on his Lasix.  It was stopped to protect his renal function most likely. Medications: Outpatient Medications Prior to Visit  Medication Sig   acetaminophen (TYLENOL) 325 MG tablet Take by mouth.   aspirin EC 81 MG tablet Take by mouth.   blood glucose meter kit and supplies Dispense based on patient and insurance preference. Use once daily as directed. (FOR ICD-10 E11.69).   capsaicin (ZOSTRIX) 0.025 % cream Apply 1 Application topically 2 (two) times daily.   clotrimazole (MYCELEX) 10 MG troche    Continuous Blood Gluc Receiver (FREESTYLE LIBRE 2 READER) DEVI 1 each by Does not apply route daily.   Continuous Blood Gluc Sensor (FREESTYLE LIBRE 2 SENSOR)  MISC 1 each by Does not apply route daily.   diazepam (VALIUM) 2 MG tablet Take 2 mg by mouth 2 (two) times daily.   gabapentin (NEURONTIN) 100 MG capsule Take 1 capsule (100 mg total) by mouth at bedtime.   insulin regular (HUMULIN R) 100 units/mL injection 6 units with meals plus correction 1 unit for every 50pts above 150. TDD up to 36 units daily. Please dispense pens.   lidocaine (LIDODERM) 5 % Place onto the skin.   mycophenolate (CELLCEPT) 250 MG capsule Take by mouth.   omeprazole (PRILOSEC) 20 MG capsule Take 1 capsule (20 mg total) by mouth daily.   ondansetron (ZOFRAN-ODT) 4 MG disintegrating tablet Take 1 tablet (4 mg total) by mouth every 8 (eight) hours as needed for nausea or vomiting.   predniSONE (DELTASONE) 5 MG tablet Take by mouth 3 (three) times daily.   valGANciclovir (VALCYTE) 450 MG tablet Take by mouth.   albuterol (VENTOLIN HFA) 108 (90 Base) MCG/ACT inhaler Inhale 2 puffs into the lungs every 6 (six) hours as needed for wheezing or shortness of breath.   benzonatate (TESSALON) 100 MG capsule Take 1 capsule (100 mg total) by mouth 2 (two) times daily as needed for cough.   calcium carbonate (TUMS EX) 750 MG chewable tablet Chew by mouth. (Patient not taking: Reported on  02/01/2022)   cetirizine (ZYRTEC) 10 MG tablet Take 1 tablet (10 mg total) by mouth daily.   Dulaglutide (TRULICITY) 1.5 BZ/1.6RC SOPN Inject 1.5 mg into the skin once a week.   ferrous sulfate 325 (65 FE) MG EC tablet Take 1 tablet by mouth every morning.   fluticasone (FLONASE) 50 MCG/ACT nasal spray Place 2 sprays into both nostrils daily.   furosemide (LASIX) 20 MG tablet Take 3 tablets (60 mg total) by mouth as directed. Takes 40 mg in AM and 20 mg in evening   hydrOXYzine (VISTARIL) 25 MG capsule Take 25 mg by mouth 2 (two) times daily.   JARDIANCE 25 MG TABS tablet TAKE 1 TABLET BY MOUTH DAILY   lactulose (CHRONULAC) 10 GM/15ML solution Take 30 mLs (20 g total) by mouth daily.   loratadine  (CLARITIN) 10 MG tablet Take 1 tablet (10 mg total) by mouth daily.   propranolol (INDERAL) 20 MG tablet Take 1 tablet (20 mg total) by mouth 2 (two) times daily.   sertraline (ZOLOFT) 50 MG tablet TAKE 1 TABLET(50 MG) BY MOUTH DAILY   spironolactone (ALDACTONE) 100 MG tablet TAKE ONE-HALF TABLET BY  MOUTH daily   TRULICITY 7.89 FY/1.0FB SOPN INJECT THE CONTENTS OF ONE  PEN SUBCUTANEOUSLY WEEKLY  AS DIRECTED   [DISCONTINUED] HUMULIN N 100 UNIT/ML injection Inject into the skin.   [DISCONTINUED] ondansetron (ZOFRAN-ODT) 4 MG disintegrating tablet Take 1 tablet (4 mg total) by mouth every 8 (eight) hours as needed for nausea or vomiting.   No facility-administered medications prior to visit.    Review of Systems  Constitutional:  Negative for appetite change, chills and fever.  Respiratory:  Negative for chest tightness, shortness of breath and wheezing.   Cardiovascular:  Negative for chest pain and palpitations.  Gastrointestinal:  Negative for abdominal pain, nausea and vomiting.    Last metabolic panel Lab Results  Component Value Date   GLUCOSE 116 (H) 11/18/2021   NA 133 (L) 11/18/2021   K 4.3 11/18/2021   CL 102 11/18/2021   CO2 25 11/18/2021   BUN 33 (H) 11/18/2021   CREATININE 2.36 (H) 11/18/2021   GFRNONAA 31 (L) 11/18/2021   CALCIUM 8.1 (L) 11/18/2021   PHOS 2.7 (L) 07/11/2018   PROT 6.4 (L) 11/18/2021   ALBUMIN 2.5 (L) 11/18/2021   LABGLOB 3.6 02/16/2021   AGRATIO 0.7 (L) 02/16/2021   BILITOT 3.6 (H) 11/18/2021   ALKPHOS 65 11/18/2021   AST 45 (H) 11/18/2021   ALT 21 11/18/2021   ANIONGAP 6 11/18/2021       Objective    BP 133/67 (BP Location: Right Arm, Patient Position: Sitting, Cuff Size: Normal)   Pulse 62   Temp 98.3 F (36.8 C) (Oral)   SpO2 98%  BP Readings from Last 3 Encounters:  02/01/22 133/67  12/03/21 (!) 155/60  11/18/21 (!) 147/54   Wt Readings from Last 3 Encounters:  12/03/21 (!) 316 lb (143.3 kg)  11/18/21 (!) 327 lb (148.3 kg)   11/18/21 (!) 327 lb 12.8 oz (148.7 kg)      Physical Exam Vitals reviewed.  Constitutional:      Appearance: Normal appearance. He is obese.  HENT:     Head: Normocephalic and atraumatic.     Right Ear: External ear normal.     Left Ear: External ear normal.     Nose: Nose normal.     Mouth/Throat:     Pharynx: Oropharynx is clear.  Eyes:     General: No scleral  icterus.    Conjunctiva/sclera: Conjunctivae normal.  Neck:     Comments: No JVD noted. Cardiovascular:     Rate and Rhythm: Normal rate and regular rhythm.     Pulses: Normal pulses.     Heart sounds: Normal heart sounds.  Pulmonary:     Effort: Pulmonary effort is normal.     Comments: Decreased breath sounds in the left base more than right Abdominal:     General: There is no distension.     Palpations: Abdomen is soft.     Tenderness: There is no abdominal tenderness.  Musculoskeletal:     Comments: 1+  edema.  Skin:    General: Skin is warm and dry.  Neurological:     General: No focal deficit present.     Mental Status: He is alert and oriented to person, place, and time. Mental status is at baseline.  Psychiatric:        Mood and Affect: Mood normal.        Behavior: Behavior normal.        Thought Content: Thought content normal.        Judgment: Judgment normal.       No results found for any visits on 02/01/22.  Assessment & Plan     1. Edema, unspecified type Restart Lasix at 40 mg today.  He has appointment with [facility] transplant in 2 days.  Follow-up here in about 3 weeks - furosemide (LASIX) 40 MG tablet; Take 1 tablet (40 mg total) by mouth daily.  Dispense: 30 tablet; Refill: 4  2. Status post liver transplant ([place]) Ointment later this week  3. Decompensated hepatic cirrhosis ([place]) That is post transplant  4. Stage 3b chronic kidney disease (CKD) ([employer]) I think the Lasix was stopped to protect his renal function  5. Obesity, Class III, BMI 40-49.9 (morbid obesity) ([place])   6.  OSA (obstructive sleep apnea)   7. Anemia, unspecified type Follow-up CBC when he sees transplant team   No follow-ups on file.      I, Wilhemena Durie, MD, have reviewed all documentation for this visit. The documentation on 02/03/22 for the exam, diagnosis, procedures, and orders are all accurate and complete.    Anagabriela Jokerst Cranford Mon, MD  [facility] [phone] (phone) [phone] (fax)  [place]

## 2022-02-02 DIAGNOSIS — Z944 Liver transplant status: Secondary | ICD-10-CM | POA: Diagnosis not present

## 2022-02-02 DIAGNOSIS — T8189XA Other complications of procedures, not elsewhere classified, initial encounter: Secondary | ICD-10-CM | POA: Diagnosis not present

## 2022-02-02 DIAGNOSIS — D849 Immunodeficiency, unspecified: Secondary | ICD-10-CM | POA: Diagnosis not present

## 2022-02-03 DIAGNOSIS — D849 Immunodeficiency, unspecified: Secondary | ICD-10-CM | POA: Diagnosis not present

## 2022-02-03 DIAGNOSIS — B1910 Unspecified viral hepatitis B without hepatic coma: Secondary | ICD-10-CM | POA: Diagnosis not present

## 2022-02-03 DIAGNOSIS — T8643 Liver transplant infection: Secondary | ICD-10-CM | POA: Diagnosis not present

## 2022-02-03 DIAGNOSIS — E1122 Type 2 diabetes mellitus with diabetic chronic kidney disease: Secondary | ICD-10-CM | POA: Diagnosis not present

## 2022-02-03 DIAGNOSIS — L7632 Postprocedural hematoma of skin and subcutaneous tissue following other procedure: Secondary | ICD-10-CM | POA: Diagnosis not present

## 2022-02-03 DIAGNOSIS — S92411D Displaced fracture of proximal phalanx of right great toe, subsequent encounter for fracture with routine healing: Secondary | ICD-10-CM | POA: Diagnosis not present

## 2022-02-03 DIAGNOSIS — D631 Anemia in chronic kidney disease: Secondary | ICD-10-CM | POA: Diagnosis not present

## 2022-02-03 DIAGNOSIS — S82831D Other fracture of upper and lower end of right fibula, subsequent encounter for closed fracture with routine healing: Secondary | ICD-10-CM | POA: Diagnosis not present

## 2022-02-03 DIAGNOSIS — T81719D Complication of unspecified artery following a procedure, not elsewhere classified, subsequent encounter: Secondary | ICD-10-CM | POA: Diagnosis not present

## 2022-02-03 DIAGNOSIS — E1165 Type 2 diabetes mellitus with hyperglycemia: Secondary | ICD-10-CM | POA: Diagnosis not present

## 2022-02-03 DIAGNOSIS — T380X5D Adverse effect of glucocorticoids and synthetic analogues, subsequent encounter: Secondary | ICD-10-CM | POA: Diagnosis not present

## 2022-02-03 DIAGNOSIS — T8189XA Other complications of procedures, not elsewhere classified, initial encounter: Secondary | ICD-10-CM | POA: Diagnosis not present

## 2022-02-03 DIAGNOSIS — D702 Other drug-induced agranulocytosis: Secondary | ICD-10-CM | POA: Diagnosis not present

## 2022-02-03 DIAGNOSIS — N1831 Chronic kidney disease, stage 3a: Secondary | ICD-10-CM | POA: Diagnosis not present

## 2022-02-04 DIAGNOSIS — T8643 Liver transplant infection: Secondary | ICD-10-CM | POA: Diagnosis not present

## 2022-02-04 DIAGNOSIS — S92411D Displaced fracture of proximal phalanx of right great toe, subsequent encounter for fracture with routine healing: Secondary | ICD-10-CM | POA: Diagnosis not present

## 2022-02-04 DIAGNOSIS — D631 Anemia in chronic kidney disease: Secondary | ICD-10-CM | POA: Diagnosis not present

## 2022-02-04 DIAGNOSIS — T81719D Complication of unspecified artery following a procedure, not elsewhere classified, subsequent encounter: Secondary | ICD-10-CM | POA: Diagnosis not present

## 2022-02-04 DIAGNOSIS — F54 Psychological and behavioral factors associated with disorders or diseases classified elsewhere: Secondary | ICD-10-CM | POA: Diagnosis not present

## 2022-02-04 DIAGNOSIS — S82831D Other fracture of upper and lower end of right fibula, subsequent encounter for closed fracture with routine healing: Secondary | ICD-10-CM | POA: Diagnosis not present

## 2022-02-04 DIAGNOSIS — F419 Anxiety disorder, unspecified: Secondary | ICD-10-CM | POA: Diagnosis not present

## 2022-02-04 DIAGNOSIS — D849 Immunodeficiency, unspecified: Secondary | ICD-10-CM | POA: Diagnosis not present

## 2022-02-04 DIAGNOSIS — B1910 Unspecified viral hepatitis B without hepatic coma: Secondary | ICD-10-CM | POA: Diagnosis not present

## 2022-02-04 DIAGNOSIS — E1122 Type 2 diabetes mellitus with diabetic chronic kidney disease: Secondary | ICD-10-CM | POA: Diagnosis not present

## 2022-02-04 DIAGNOSIS — E1165 Type 2 diabetes mellitus with hyperglycemia: Secondary | ICD-10-CM | POA: Diagnosis not present

## 2022-02-04 DIAGNOSIS — L7632 Postprocedural hematoma of skin and subcutaneous tissue following other procedure: Secondary | ICD-10-CM | POA: Diagnosis not present

## 2022-02-04 DIAGNOSIS — D702 Other drug-induced agranulocytosis: Secondary | ICD-10-CM | POA: Diagnosis not present

## 2022-02-04 DIAGNOSIS — T380X5D Adverse effect of glucocorticoids and synthetic analogues, subsequent encounter: Secondary | ICD-10-CM | POA: Diagnosis not present

## 2022-02-04 DIAGNOSIS — T8189XA Other complications of procedures, not elsewhere classified, initial encounter: Secondary | ICD-10-CM | POA: Diagnosis not present

## 2022-02-04 DIAGNOSIS — N1831 Chronic kidney disease, stage 3a: Secondary | ICD-10-CM | POA: Diagnosis not present

## 2022-02-05 DIAGNOSIS — T8643 Liver transplant infection: Secondary | ICD-10-CM | POA: Diagnosis not present

## 2022-02-05 DIAGNOSIS — E1165 Type 2 diabetes mellitus with hyperglycemia: Secondary | ICD-10-CM | POA: Diagnosis not present

## 2022-02-05 DIAGNOSIS — D631 Anemia in chronic kidney disease: Secondary | ICD-10-CM | POA: Diagnosis not present

## 2022-02-05 DIAGNOSIS — Y830 Surgical operation with transplant of whole organ as the cause of abnormal reaction of the patient, or of later complication, without mention of misadventure at the time of the procedure: Secondary | ICD-10-CM | POA: Diagnosis not present

## 2022-02-05 DIAGNOSIS — B1910 Unspecified viral hepatitis B without hepatic coma: Secondary | ICD-10-CM | POA: Diagnosis not present

## 2022-02-05 DIAGNOSIS — Z4823 Encounter for aftercare following liver transplant: Secondary | ICD-10-CM | POA: Diagnosis not present

## 2022-02-05 DIAGNOSIS — T81719D Complication of unspecified artery following a procedure, not elsewhere classified, subsequent encounter: Secondary | ICD-10-CM | POA: Diagnosis not present

## 2022-02-05 DIAGNOSIS — D702 Other drug-induced agranulocytosis: Secondary | ICD-10-CM | POA: Diagnosis not present

## 2022-02-05 DIAGNOSIS — T380X5D Adverse effect of glucocorticoids and synthetic analogues, subsequent encounter: Secondary | ICD-10-CM | POA: Diagnosis not present

## 2022-02-05 DIAGNOSIS — S92411D Displaced fracture of proximal phalanx of right great toe, subsequent encounter for fracture with routine healing: Secondary | ICD-10-CM | POA: Diagnosis not present

## 2022-02-05 DIAGNOSIS — T8189XA Other complications of procedures, not elsewhere classified, initial encounter: Secondary | ICD-10-CM | POA: Diagnosis not present

## 2022-02-05 DIAGNOSIS — L7632 Postprocedural hematoma of skin and subcutaneous tissue following other procedure: Secondary | ICD-10-CM | POA: Diagnosis not present

## 2022-02-05 DIAGNOSIS — N1831 Chronic kidney disease, stage 3a: Secondary | ICD-10-CM | POA: Diagnosis not present

## 2022-02-05 DIAGNOSIS — S82831D Other fracture of upper and lower end of right fibula, subsequent encounter for closed fracture with routine healing: Secondary | ICD-10-CM | POA: Diagnosis not present

## 2022-02-05 DIAGNOSIS — E1122 Type 2 diabetes mellitus with diabetic chronic kidney disease: Secondary | ICD-10-CM | POA: Diagnosis not present

## 2022-02-05 DIAGNOSIS — D849 Immunodeficiency, unspecified: Secondary | ICD-10-CM | POA: Diagnosis not present

## 2022-02-06 DIAGNOSIS — T8189XA Other complications of procedures, not elsewhere classified, initial encounter: Secondary | ICD-10-CM | POA: Diagnosis not present

## 2022-02-07 DIAGNOSIS — T8189XA Other complications of procedures, not elsewhere classified, initial encounter: Secondary | ICD-10-CM | POA: Diagnosis not present

## 2022-02-08 DIAGNOSIS — B1910 Unspecified viral hepatitis B without hepatic coma: Secondary | ICD-10-CM | POA: Diagnosis not present

## 2022-02-08 DIAGNOSIS — D849 Immunodeficiency, unspecified: Secondary | ICD-10-CM | POA: Diagnosis not present

## 2022-02-08 DIAGNOSIS — L7632 Postprocedural hematoma of skin and subcutaneous tissue following other procedure: Secondary | ICD-10-CM | POA: Diagnosis not present

## 2022-02-08 DIAGNOSIS — F419 Anxiety disorder, unspecified: Secondary | ICD-10-CM | POA: Diagnosis not present

## 2022-02-08 DIAGNOSIS — D702 Other drug-induced agranulocytosis: Secondary | ICD-10-CM | POA: Diagnosis not present

## 2022-02-08 DIAGNOSIS — T81719D Complication of unspecified artery following a procedure, not elsewhere classified, subsequent encounter: Secondary | ICD-10-CM | POA: Diagnosis not present

## 2022-02-08 DIAGNOSIS — N1831 Chronic kidney disease, stage 3a: Secondary | ICD-10-CM | POA: Diagnosis not present

## 2022-02-08 DIAGNOSIS — D631 Anemia in chronic kidney disease: Secondary | ICD-10-CM | POA: Diagnosis not present

## 2022-02-08 DIAGNOSIS — T8643 Liver transplant infection: Secondary | ICD-10-CM | POA: Diagnosis not present

## 2022-02-08 DIAGNOSIS — E1165 Type 2 diabetes mellitus with hyperglycemia: Secondary | ICD-10-CM | POA: Diagnosis not present

## 2022-02-08 DIAGNOSIS — S92411D Displaced fracture of proximal phalanx of right great toe, subsequent encounter for fracture with routine healing: Secondary | ICD-10-CM | POA: Diagnosis not present

## 2022-02-08 DIAGNOSIS — S82831D Other fracture of upper and lower end of right fibula, subsequent encounter for closed fracture with routine healing: Secondary | ICD-10-CM | POA: Diagnosis not present

## 2022-02-08 DIAGNOSIS — T380X5D Adverse effect of glucocorticoids and synthetic analogues, subsequent encounter: Secondary | ICD-10-CM | POA: Diagnosis not present

## 2022-02-08 DIAGNOSIS — E1122 Type 2 diabetes mellitus with diabetic chronic kidney disease: Secondary | ICD-10-CM | POA: Diagnosis not present

## 2022-02-08 DIAGNOSIS — T8189XA Other complications of procedures, not elsewhere classified, initial encounter: Secondary | ICD-10-CM | POA: Diagnosis not present

## 2022-02-09 DIAGNOSIS — B1910 Unspecified viral hepatitis B without hepatic coma: Secondary | ICD-10-CM | POA: Diagnosis not present

## 2022-02-09 DIAGNOSIS — D702 Other drug-induced agranulocytosis: Secondary | ICD-10-CM | POA: Diagnosis not present

## 2022-02-09 DIAGNOSIS — S82831D Other fracture of upper and lower end of right fibula, subsequent encounter for closed fracture with routine healing: Secondary | ICD-10-CM | POA: Diagnosis not present

## 2022-02-09 DIAGNOSIS — T8189XA Other complications of procedures, not elsewhere classified, initial encounter: Secondary | ICD-10-CM | POA: Diagnosis not present

## 2022-02-09 DIAGNOSIS — D631 Anemia in chronic kidney disease: Secondary | ICD-10-CM | POA: Diagnosis not present

## 2022-02-09 DIAGNOSIS — D849 Immunodeficiency, unspecified: Secondary | ICD-10-CM | POA: Diagnosis not present

## 2022-02-09 DIAGNOSIS — T380X5D Adverse effect of glucocorticoids and synthetic analogues, subsequent encounter: Secondary | ICD-10-CM | POA: Diagnosis not present

## 2022-02-09 DIAGNOSIS — S92411D Displaced fracture of proximal phalanx of right great toe, subsequent encounter for fracture with routine healing: Secondary | ICD-10-CM | POA: Diagnosis not present

## 2022-02-09 DIAGNOSIS — N1831 Chronic kidney disease, stage 3a: Secondary | ICD-10-CM | POA: Diagnosis not present

## 2022-02-09 DIAGNOSIS — T8643 Liver transplant infection: Secondary | ICD-10-CM | POA: Diagnosis not present

## 2022-02-09 DIAGNOSIS — T81719D Complication of unspecified artery following a procedure, not elsewhere classified, subsequent encounter: Secondary | ICD-10-CM | POA: Diagnosis not present

## 2022-02-09 DIAGNOSIS — L7632 Postprocedural hematoma of skin and subcutaneous tissue following other procedure: Secondary | ICD-10-CM | POA: Diagnosis not present

## 2022-02-09 DIAGNOSIS — E1165 Type 2 diabetes mellitus with hyperglycemia: Secondary | ICD-10-CM | POA: Diagnosis not present

## 2022-02-09 DIAGNOSIS — E1122 Type 2 diabetes mellitus with diabetic chronic kidney disease: Secondary | ICD-10-CM | POA: Diagnosis not present

## 2022-02-10 DIAGNOSIS — R161 Splenomegaly, not elsewhere classified: Secondary | ICD-10-CM | POA: Diagnosis not present

## 2022-02-10 DIAGNOSIS — R34 Anuria and oliguria: Secondary | ICD-10-CM | POA: Diagnosis not present

## 2022-02-10 DIAGNOSIS — E872 Acidosis, unspecified: Secondary | ICD-10-CM | POA: Diagnosis not present

## 2022-02-10 DIAGNOSIS — R112 Nausea with vomiting, unspecified: Secondary | ICD-10-CM | POA: Diagnosis not present

## 2022-02-10 DIAGNOSIS — D631 Anemia in chronic kidney disease: Secondary | ICD-10-CM | POA: Diagnosis not present

## 2022-02-10 DIAGNOSIS — N179 Acute kidney failure, unspecified: Secondary | ICD-10-CM | POA: Diagnosis not present

## 2022-02-10 DIAGNOSIS — J811 Chronic pulmonary edema: Secondary | ICD-10-CM | POA: Diagnosis not present

## 2022-02-10 DIAGNOSIS — Z944 Liver transplant status: Secondary | ICD-10-CM | POA: Diagnosis not present

## 2022-02-10 DIAGNOSIS — R918 Other nonspecific abnormal finding of lung field: Secondary | ICD-10-CM | POA: Diagnosis not present

## 2022-02-10 DIAGNOSIS — E8770 Fluid overload, unspecified: Secondary | ICD-10-CM | POA: Diagnosis not present

## 2022-02-10 DIAGNOSIS — D500 Iron deficiency anemia secondary to blood loss (chronic): Secondary | ICD-10-CM | POA: Diagnosis not present

## 2022-02-10 DIAGNOSIS — Z9884 Bariatric surgery status: Secondary | ICD-10-CM | POA: Diagnosis not present

## 2022-02-10 DIAGNOSIS — R188 Other ascites: Secondary | ICD-10-CM | POA: Diagnosis not present

## 2022-02-10 DIAGNOSIS — Z789 Other specified health status: Secondary | ICD-10-CM | POA: Diagnosis not present

## 2022-02-10 DIAGNOSIS — I700 Atherosclerosis of aorta: Secondary | ICD-10-CM | POA: Diagnosis not present

## 2022-02-10 DIAGNOSIS — I517 Cardiomegaly: Secondary | ICD-10-CM | POA: Diagnosis not present

## 2022-02-10 DIAGNOSIS — N1831 Chronic kidney disease, stage 3a: Secondary | ICD-10-CM | POA: Diagnosis not present

## 2022-02-10 DIAGNOSIS — J90 Pleural effusion, not elsewhere classified: Secondary | ICD-10-CM | POA: Diagnosis not present

## 2022-02-10 DIAGNOSIS — R7989 Other specified abnormal findings of blood chemistry: Secondary | ICD-10-CM | POA: Diagnosis not present

## 2022-02-10 DIAGNOSIS — A0472 Enterocolitis due to Clostridium difficile, not specified as recurrent: Secondary | ICD-10-CM | POA: Diagnosis not present

## 2022-02-10 DIAGNOSIS — F419 Anxiety disorder, unspecified: Secondary | ICD-10-CM | POA: Diagnosis not present

## 2022-02-10 DIAGNOSIS — K831 Obstruction of bile duct: Secondary | ICD-10-CM | POA: Diagnosis not present

## 2022-02-10 DIAGNOSIS — I158 Other secondary hypertension: Secondary | ICD-10-CM | POA: Diagnosis not present

## 2022-02-10 DIAGNOSIS — R197 Diarrhea, unspecified: Secondary | ICD-10-CM | POA: Diagnosis not present

## 2022-02-10 DIAGNOSIS — E1122 Type 2 diabetes mellitus with diabetic chronic kidney disease: Secondary | ICD-10-CM | POA: Diagnosis not present

## 2022-02-10 DIAGNOSIS — D849 Immunodeficiency, unspecified: Secondary | ICD-10-CM | POA: Diagnosis not present

## 2022-02-10 DIAGNOSIS — R609 Edema, unspecified: Secondary | ICD-10-CM | POA: Diagnosis not present

## 2022-02-10 DIAGNOSIS — D84821 Immunodeficiency due to drugs: Secondary | ICD-10-CM | POA: Diagnosis not present

## 2022-02-10 DIAGNOSIS — D61811 Other drug-induced pancytopenia: Secondary | ICD-10-CM | POA: Diagnosis not present

## 2022-02-10 DIAGNOSIS — J810 Acute pulmonary edema: Secondary | ICD-10-CM | POA: Diagnosis not present

## 2022-02-10 DIAGNOSIS — K9189 Other postprocedural complications and disorders of digestive system: Secondary | ICD-10-CM | POA: Diagnosis not present

## 2022-02-10 DIAGNOSIS — R5381 Other malaise: Secondary | ICD-10-CM | POA: Diagnosis not present

## 2022-02-10 DIAGNOSIS — Z4659 Encounter for fitting and adjustment of other gastrointestinal appliance and device: Secondary | ICD-10-CM | POA: Diagnosis not present

## 2022-02-10 DIAGNOSIS — K3189 Other diseases of stomach and duodenum: Secondary | ICD-10-CM | POA: Diagnosis not present

## 2022-02-10 DIAGNOSIS — K838 Other specified diseases of biliary tract: Secondary | ICD-10-CM | POA: Diagnosis not present

## 2022-02-10 DIAGNOSIS — R1115 Cyclical vomiting syndrome unrelated to migraine: Secondary | ICD-10-CM | POA: Diagnosis not present

## 2022-02-10 DIAGNOSIS — T8189XA Other complications of procedures, not elsewhere classified, initial encounter: Secondary | ICD-10-CM | POA: Diagnosis not present

## 2022-02-23 ENCOUNTER — Ambulatory Visit: Payer: BC Managed Care – PPO | Admitting: Family Medicine

## 2022-02-23 DIAGNOSIS — T8643 Liver transplant infection: Secondary | ICD-10-CM | POA: Diagnosis not present

## 2022-02-23 DIAGNOSIS — S82831D Other fracture of upper and lower end of right fibula, subsequent encounter for closed fracture with routine healing: Secondary | ICD-10-CM | POA: Diagnosis not present

## 2022-02-23 DIAGNOSIS — L7632 Postprocedural hematoma of skin and subcutaneous tissue following other procedure: Secondary | ICD-10-CM | POA: Diagnosis not present

## 2022-02-23 DIAGNOSIS — A0472 Enterocolitis due to Clostridium difficile, not specified as recurrent: Secondary | ICD-10-CM | POA: Diagnosis not present

## 2022-02-23 DIAGNOSIS — E1122 Type 2 diabetes mellitus with diabetic chronic kidney disease: Secondary | ICD-10-CM | POA: Diagnosis not present

## 2022-02-23 DIAGNOSIS — I129 Hypertensive chronic kidney disease with stage 1 through stage 4 chronic kidney disease, or unspecified chronic kidney disease: Secondary | ICD-10-CM | POA: Diagnosis not present

## 2022-02-23 DIAGNOSIS — Z79621 Long term (current) use of calcineurin inhibitor: Secondary | ICD-10-CM | POA: Diagnosis not present

## 2022-02-23 DIAGNOSIS — S92411D Displaced fracture of proximal phalanx of right great toe, subsequent encounter for fracture with routine healing: Secondary | ICD-10-CM | POA: Diagnosis not present

## 2022-02-23 DIAGNOSIS — R609 Edema, unspecified: Secondary | ICD-10-CM | POA: Diagnosis not present

## 2022-02-23 DIAGNOSIS — N1831 Chronic kidney disease, stage 3a: Secondary | ICD-10-CM | POA: Diagnosis not present

## 2022-02-23 DIAGNOSIS — K7460 Unspecified cirrhosis of liver: Secondary | ICD-10-CM | POA: Diagnosis not present

## 2022-02-23 DIAGNOSIS — B1910 Unspecified viral hepatitis B without hepatic coma: Secondary | ICD-10-CM | POA: Diagnosis not present

## 2022-02-24 DIAGNOSIS — Z9884 Bariatric surgery status: Secondary | ICD-10-CM | POA: Diagnosis not present

## 2022-02-24 DIAGNOSIS — K912 Postsurgical malabsorption, not elsewhere classified: Secondary | ICD-10-CM | POA: Diagnosis not present

## 2022-02-24 DIAGNOSIS — Z6837 Body mass index (BMI) 37.0-37.9, adult: Secondary | ICD-10-CM | POA: Diagnosis not present

## 2022-02-24 DIAGNOSIS — K5900 Constipation, unspecified: Secondary | ICD-10-CM | POA: Diagnosis not present

## 2022-02-24 DIAGNOSIS — G4733 Obstructive sleep apnea (adult) (pediatric): Secondary | ICD-10-CM | POA: Diagnosis not present

## 2022-02-24 DIAGNOSIS — Z79899 Other long term (current) drug therapy: Secondary | ICD-10-CM | POA: Diagnosis not present

## 2022-02-24 DIAGNOSIS — Z713 Dietary counseling and surveillance: Secondary | ICD-10-CM | POA: Diagnosis not present

## 2022-02-24 DIAGNOSIS — K219 Gastro-esophageal reflux disease without esophagitis: Secondary | ICD-10-CM | POA: Diagnosis not present

## 2022-02-24 DIAGNOSIS — R718 Other abnormality of red blood cells: Secondary | ICD-10-CM | POA: Diagnosis not present

## 2022-02-24 DIAGNOSIS — D849 Immunodeficiency, unspecified: Secondary | ICD-10-CM | POA: Diagnosis not present

## 2022-02-24 DIAGNOSIS — Z944 Liver transplant status: Secondary | ICD-10-CM | POA: Diagnosis not present

## 2022-02-24 DIAGNOSIS — Z48815 Encounter for surgical aftercare following surgery on the digestive system: Secondary | ICD-10-CM | POA: Diagnosis not present

## 2022-03-01 DIAGNOSIS — S92411D Displaced fracture of proximal phalanx of right great toe, subsequent encounter for fracture with routine healing: Secondary | ICD-10-CM | POA: Diagnosis not present

## 2022-03-01 DIAGNOSIS — T8643 Liver transplant infection: Secondary | ICD-10-CM | POA: Diagnosis not present

## 2022-03-01 DIAGNOSIS — L7632 Postprocedural hematoma of skin and subcutaneous tissue following other procedure: Secondary | ICD-10-CM | POA: Diagnosis not present

## 2022-03-01 DIAGNOSIS — S82831D Other fracture of upper and lower end of right fibula, subsequent encounter for closed fracture with routine healing: Secondary | ICD-10-CM | POA: Diagnosis not present

## 2022-03-01 DIAGNOSIS — Z79621 Long term (current) use of calcineurin inhibitor: Secondary | ICD-10-CM | POA: Diagnosis not present

## 2022-03-01 DIAGNOSIS — E1122 Type 2 diabetes mellitus with diabetic chronic kidney disease: Secondary | ICD-10-CM | POA: Diagnosis not present

## 2022-03-01 DIAGNOSIS — R609 Edema, unspecified: Secondary | ICD-10-CM | POA: Diagnosis not present

## 2022-03-01 DIAGNOSIS — B1910 Unspecified viral hepatitis B without hepatic coma: Secondary | ICD-10-CM | POA: Diagnosis not present

## 2022-03-01 DIAGNOSIS — N1831 Chronic kidney disease, stage 3a: Secondary | ICD-10-CM | POA: Diagnosis not present

## 2022-03-01 DIAGNOSIS — A0472 Enterocolitis due to Clostridium difficile, not specified as recurrent: Secondary | ICD-10-CM | POA: Diagnosis not present

## 2022-03-01 DIAGNOSIS — I129 Hypertensive chronic kidney disease with stage 1 through stage 4 chronic kidney disease, or unspecified chronic kidney disease: Secondary | ICD-10-CM | POA: Diagnosis not present

## 2022-03-01 DIAGNOSIS — K7460 Unspecified cirrhosis of liver: Secondary | ICD-10-CM | POA: Diagnosis not present

## 2022-03-03 DIAGNOSIS — Y712 Prosthetic and other implants, materials and accessory cardiovascular devices associated with adverse incidents: Secondary | ICD-10-CM | POA: Diagnosis not present

## 2022-03-03 DIAGNOSIS — B1910 Unspecified viral hepatitis B without hepatic coma: Secondary | ICD-10-CM | POA: Diagnosis not present

## 2022-03-03 DIAGNOSIS — A0472 Enterocolitis due to Clostridium difficile, not specified as recurrent: Secondary | ICD-10-CM | POA: Diagnosis not present

## 2022-03-03 DIAGNOSIS — J189 Pneumonia, unspecified organism: Secondary | ICD-10-CM | POA: Diagnosis not present

## 2022-03-03 DIAGNOSIS — R112 Nausea with vomiting, unspecified: Secondary | ICD-10-CM | POA: Diagnosis not present

## 2022-03-03 DIAGNOSIS — Z5181 Encounter for therapeutic drug level monitoring: Secondary | ICD-10-CM | POA: Diagnosis not present

## 2022-03-03 DIAGNOSIS — K7460 Unspecified cirrhosis of liver: Secondary | ICD-10-CM | POA: Diagnosis not present

## 2022-03-03 DIAGNOSIS — Z4689 Encounter for fitting and adjustment of other specified devices: Secondary | ICD-10-CM | POA: Diagnosis not present

## 2022-03-03 DIAGNOSIS — F54 Psychological and behavioral factors associated with disorders or diseases classified elsewhere: Secondary | ICD-10-CM | POA: Diagnosis not present

## 2022-03-03 DIAGNOSIS — I959 Hypotension, unspecified: Secondary | ICD-10-CM | POA: Diagnosis not present

## 2022-03-03 DIAGNOSIS — Z9989 Dependence on other enabling machines and devices: Secondary | ICD-10-CM | POA: Diagnosis not present

## 2022-03-03 DIAGNOSIS — G9341 Metabolic encephalopathy: Secondary | ICD-10-CM | POA: Diagnosis not present

## 2022-03-03 DIAGNOSIS — D61818 Other pancytopenia: Secondary | ICD-10-CM | POA: Diagnosis not present

## 2022-03-03 DIAGNOSIS — Z992 Dependence on renal dialysis: Secondary | ICD-10-CM | POA: Diagnosis not present

## 2022-03-03 DIAGNOSIS — K8051 Calculus of bile duct without cholangitis or cholecystitis with obstruction: Secondary | ICD-10-CM | POA: Diagnosis not present

## 2022-03-03 DIAGNOSIS — F4325 Adjustment disorder with mixed disturbance of emotions and conduct: Secondary | ICD-10-CM | POA: Diagnosis not present

## 2022-03-03 DIAGNOSIS — N189 Chronic kidney disease, unspecified: Secondary | ICD-10-CM | POA: Diagnosis not present

## 2022-03-03 DIAGNOSIS — R0902 Hypoxemia: Secondary | ICD-10-CM | POA: Diagnosis not present

## 2022-03-03 DIAGNOSIS — N179 Acute kidney failure, unspecified: Secondary | ICD-10-CM | POA: Diagnosis not present

## 2022-03-03 DIAGNOSIS — Z792 Long term (current) use of antibiotics: Secondary | ICD-10-CM | POA: Diagnosis not present

## 2022-03-03 DIAGNOSIS — R058 Other specified cough: Secondary | ICD-10-CM | POA: Diagnosis not present

## 2022-03-03 DIAGNOSIS — F4323 Adjustment disorder with mixed anxiety and depressed mood: Secondary | ICD-10-CM | POA: Diagnosis not present

## 2022-03-03 DIAGNOSIS — K5939 Other megacolon: Secondary | ICD-10-CM | POA: Diagnosis not present

## 2022-03-03 DIAGNOSIS — R932 Abnormal findings on diagnostic imaging of liver and biliary tract: Secondary | ICD-10-CM | POA: Diagnosis not present

## 2022-03-03 DIAGNOSIS — J9602 Acute respiratory failure with hypercapnia: Secondary | ICD-10-CM | POA: Diagnosis not present

## 2022-03-03 DIAGNOSIS — N1831 Chronic kidney disease, stage 3a: Secondary | ICD-10-CM | POA: Diagnosis not present

## 2022-03-03 DIAGNOSIS — D84821 Immunodeficiency due to drugs: Secondary | ICD-10-CM | POA: Diagnosis not present

## 2022-03-03 DIAGNOSIS — R0602 Shortness of breath: Secondary | ICD-10-CM | POA: Diagnosis not present

## 2022-03-03 DIAGNOSIS — R443 Hallucinations, unspecified: Secondary | ICD-10-CM | POA: Diagnosis not present

## 2022-03-03 DIAGNOSIS — K5989 Other specified functional intestinal disorders: Secondary | ICD-10-CM | POA: Diagnosis not present

## 2022-03-03 DIAGNOSIS — K8309 Other cholangitis: Secondary | ICD-10-CM | POA: Diagnosis not present

## 2022-03-03 DIAGNOSIS — R42 Dizziness and giddiness: Secondary | ICD-10-CM | POA: Diagnosis not present

## 2022-03-03 DIAGNOSIS — F22 Delusional disorders: Secondary | ICD-10-CM | POA: Diagnosis not present

## 2022-03-03 DIAGNOSIS — Z944 Liver transplant status: Secondary | ICD-10-CM | POA: Diagnosis not present

## 2022-03-03 DIAGNOSIS — Z87898 Personal history of other specified conditions: Secondary | ICD-10-CM | POA: Diagnosis not present

## 2022-03-03 DIAGNOSIS — K7581 Nonalcoholic steatohepatitis (NASH): Secondary | ICD-10-CM | POA: Diagnosis not present

## 2022-03-03 DIAGNOSIS — T8649 Other complications of liver transplant: Secondary | ICD-10-CM | POA: Diagnosis not present

## 2022-03-03 DIAGNOSIS — K831 Obstruction of bile duct: Secondary | ICD-10-CM | POA: Diagnosis not present

## 2022-03-03 DIAGNOSIS — R2243 Localized swelling, mass and lump, lower limb, bilateral: Secondary | ICD-10-CM | POA: Diagnosis not present

## 2022-03-03 DIAGNOSIS — R140 Abdominal distension (gaseous): Secondary | ICD-10-CM | POA: Diagnosis not present

## 2022-03-03 DIAGNOSIS — I151 Hypertension secondary to other renal disorders: Secondary | ICD-10-CM | POA: Diagnosis not present

## 2022-03-03 DIAGNOSIS — J9612 Chronic respiratory failure with hypercapnia: Secondary | ICD-10-CM | POA: Diagnosis not present

## 2022-03-03 DIAGNOSIS — K8050 Calculus of bile duct without cholangitis or cholecystitis without obstruction: Secondary | ICD-10-CM | POA: Diagnosis not present

## 2022-03-03 DIAGNOSIS — Z87442 Personal history of urinary calculi: Secondary | ICD-10-CM | POA: Diagnosis not present

## 2022-03-03 DIAGNOSIS — N281 Cyst of kidney, acquired: Secondary | ICD-10-CM | POA: Diagnosis not present

## 2022-03-03 DIAGNOSIS — R600 Localized edema: Secondary | ICD-10-CM | POA: Diagnosis not present

## 2022-03-03 DIAGNOSIS — K838 Other specified diseases of biliary tract: Secondary | ICD-10-CM | POA: Diagnosis not present

## 2022-03-03 DIAGNOSIS — T85590A Other mechanical complication of bile duct prosthesis, initial encounter: Secondary | ICD-10-CM | POA: Diagnosis not present

## 2022-03-03 DIAGNOSIS — R188 Other ascites: Secondary | ICD-10-CM | POA: Diagnosis not present

## 2022-03-03 DIAGNOSIS — W19XXXA Unspecified fall, initial encounter: Secondary | ICD-10-CM | POA: Diagnosis not present

## 2022-03-03 DIAGNOSIS — I953 Hypotension of hemodialysis: Secondary | ICD-10-CM | POA: Diagnosis not present

## 2022-03-03 DIAGNOSIS — G4733 Obstructive sleep apnea (adult) (pediatric): Secondary | ICD-10-CM | POA: Diagnosis not present

## 2022-03-03 DIAGNOSIS — R413 Other amnesia: Secondary | ICD-10-CM | POA: Diagnosis not present

## 2022-03-03 DIAGNOSIS — R410 Disorientation, unspecified: Secondary | ICD-10-CM | POA: Diagnosis not present

## 2022-03-03 DIAGNOSIS — K5900 Constipation, unspecified: Secondary | ICD-10-CM | POA: Diagnosis not present

## 2022-03-03 DIAGNOSIS — E46 Unspecified protein-calorie malnutrition: Secondary | ICD-10-CM | POA: Diagnosis not present

## 2022-03-03 DIAGNOSIS — R571 Hypovolemic shock: Secondary | ICD-10-CM | POA: Diagnosis not present

## 2022-03-03 DIAGNOSIS — Z4659 Encounter for fitting and adjustment of other gastrointestinal appliance and device: Secondary | ICD-10-CM | POA: Diagnosis not present

## 2022-03-03 DIAGNOSIS — K668 Other specified disorders of peritoneum: Secondary | ICD-10-CM | POA: Diagnosis not present

## 2022-03-03 DIAGNOSIS — D696 Thrombocytopenia, unspecified: Secondary | ICD-10-CM | POA: Diagnosis not present

## 2022-03-03 DIAGNOSIS — N170 Acute kidney failure with tubular necrosis: Secondary | ICD-10-CM | POA: Diagnosis not present

## 2022-03-03 DIAGNOSIS — R059 Cough, unspecified: Secondary | ICD-10-CM | POA: Diagnosis not present

## 2022-03-03 DIAGNOSIS — E441 Mild protein-calorie malnutrition: Secondary | ICD-10-CM | POA: Diagnosis not present

## 2022-03-03 DIAGNOSIS — F05 Delirium due to known physiological condition: Secondary | ICD-10-CM | POA: Diagnosis not present

## 2022-03-03 DIAGNOSIS — I517 Cardiomegaly: Secondary | ICD-10-CM | POA: Diagnosis not present

## 2022-03-03 DIAGNOSIS — N200 Calculus of kidney: Secondary | ICD-10-CM | POA: Diagnosis not present

## 2022-03-03 DIAGNOSIS — A419 Sepsis, unspecified organism: Secondary | ICD-10-CM | POA: Diagnosis not present

## 2022-03-03 DIAGNOSIS — R29818 Other symptoms and signs involving the nervous system: Secondary | ICD-10-CM | POA: Diagnosis not present

## 2022-03-03 DIAGNOSIS — R5381 Other malaise: Secondary | ICD-10-CM | POA: Diagnosis not present

## 2022-03-03 DIAGNOSIS — I129 Hypertensive chronic kidney disease with stage 1 through stage 4 chronic kidney disease, or unspecified chronic kidney disease: Secondary | ICD-10-CM | POA: Diagnosis not present

## 2022-03-03 DIAGNOSIS — J9601 Acute respiratory failure with hypoxia: Secondary | ICD-10-CM | POA: Diagnosis not present

## 2022-03-03 DIAGNOSIS — I618 Other nontraumatic intracerebral hemorrhage: Secondary | ICD-10-CM | POA: Diagnosis not present

## 2022-03-03 DIAGNOSIS — R918 Other nonspecific abnormal finding of lung field: Secondary | ICD-10-CM | POA: Diagnosis not present

## 2022-03-03 DIAGNOSIS — A0471 Enterocolitis due to Clostridium difficile, recurrent: Secondary | ICD-10-CM | POA: Diagnosis not present

## 2022-03-03 DIAGNOSIS — Z903 Acquired absence of stomach [part of]: Secondary | ICD-10-CM | POA: Diagnosis not present

## 2022-03-03 DIAGNOSIS — E11649 Type 2 diabetes mellitus with hypoglycemia without coma: Secondary | ICD-10-CM | POA: Diagnosis not present

## 2022-03-03 DIAGNOSIS — J9811 Atelectasis: Secondary | ICD-10-CM | POA: Diagnosis not present

## 2022-03-03 DIAGNOSIS — Z9884 Bariatric surgery status: Secondary | ICD-10-CM | POA: Diagnosis not present

## 2022-03-03 DIAGNOSIS — Z452 Encounter for adjustment and management of vascular access device: Secondary | ICD-10-CM | POA: Diagnosis not present

## 2022-03-03 DIAGNOSIS — R0689 Other abnormalities of breathing: Secondary | ICD-10-CM | POA: Diagnosis not present

## 2022-03-03 DIAGNOSIS — R4182 Altered mental status, unspecified: Secondary | ICD-10-CM | POA: Diagnosis not present

## 2022-03-03 DIAGNOSIS — E119 Type 2 diabetes mellitus without complications: Secondary | ICD-10-CM | POA: Diagnosis not present

## 2022-03-03 DIAGNOSIS — E8779 Other fluid overload: Secondary | ICD-10-CM | POA: Diagnosis not present

## 2022-03-03 DIAGNOSIS — B9689 Other specified bacterial agents as the cause of diseases classified elsewhere: Secondary | ICD-10-CM | POA: Diagnosis not present

## 2022-03-03 DIAGNOSIS — J811 Chronic pulmonary edema: Secondary | ICD-10-CM | POA: Diagnosis not present

## 2022-03-03 DIAGNOSIS — R053 Chronic cough: Secondary | ICD-10-CM | POA: Diagnosis not present

## 2022-03-03 DIAGNOSIS — N186 End stage renal disease: Secondary | ICD-10-CM | POA: Diagnosis not present

## 2022-03-03 DIAGNOSIS — E8770 Fluid overload, unspecified: Secondary | ICD-10-CM | POA: Diagnosis not present

## 2022-03-03 DIAGNOSIS — H538 Other visual disturbances: Secondary | ICD-10-CM | POA: Diagnosis not present

## 2022-03-03 DIAGNOSIS — D500 Iron deficiency anemia secondary to blood loss (chronic): Secondary | ICD-10-CM | POA: Diagnosis not present

## 2022-03-03 DIAGNOSIS — K6389 Other specified diseases of intestine: Secondary | ICD-10-CM | POA: Diagnosis not present

## 2022-03-03 DIAGNOSIS — D849 Immunodeficiency, unspecified: Secondary | ICD-10-CM | POA: Diagnosis not present

## 2022-03-03 DIAGNOSIS — E1122 Type 2 diabetes mellitus with diabetic chronic kidney disease: Secondary | ICD-10-CM | POA: Diagnosis not present

## 2022-03-03 DIAGNOSIS — R197 Diarrhea, unspecified: Secondary | ICD-10-CM | POA: Diagnosis not present

## 2022-03-03 DIAGNOSIS — W1839XA Other fall on same level, initial encounter: Secondary | ICD-10-CM | POA: Diagnosis not present

## 2022-03-03 DIAGNOSIS — D649 Anemia, unspecified: Secondary | ICD-10-CM | POA: Diagnosis not present

## 2022-03-03 DIAGNOSIS — K9189 Other postprocedural complications and disorders of digestive system: Secondary | ICD-10-CM | POA: Diagnosis not present

## 2022-03-03 DIAGNOSIS — I3139 Other pericardial effusion (noninflammatory): Secondary | ICD-10-CM | POA: Diagnosis not present

## 2022-03-03 DIAGNOSIS — N1832 Chronic kidney disease, stage 3b: Secondary | ICD-10-CM | POA: Diagnosis not present

## 2022-03-03 DIAGNOSIS — J90 Pleural effusion, not elsewhere classified: Secondary | ICD-10-CM | POA: Diagnosis not present

## 2022-03-03 DIAGNOSIS — S3993XA Unspecified injury of pelvis, initial encounter: Secondary | ICD-10-CM | POA: Diagnosis not present

## 2022-03-03 DIAGNOSIS — T82598A Other mechanical complication of other cardiac and vascular devices and implants, initial encounter: Secondary | ICD-10-CM | POA: Diagnosis not present

## 2022-03-17 ENCOUNTER — Telehealth: Payer: Self-pay

## 2022-03-17 NOTE — Telephone Encounter (Signed)
Done  Copied from [place] [id number]. Topic: General - Other >> Mar 17, 2022  3:31 PM Leitha Schuller wrote: Pt inquiring if a handicap placard can be mailed to his address on file  Please assist further >> Mar 17, 2022  3:40 PM Loura Pardon W wrote:  *correction*  Handicap parking sign

## 2022-03-23 ENCOUNTER — Ambulatory Visit: Payer: Self-pay

## 2022-03-23 NOTE — Patient Outreach (Signed)
  Care Coordination   Initial Visit Note   03/23/2022 Name: Jesse Zimmerman MRN: [id number] DOB: 1960-12-12  Jesse Zimmerman is a 61 y.o. year old male who sees Jesse Zimmerman., MD for primary care. I  spoke with the patients wife and DRP, Jesse Zimmerman  What matters to the patients health and wellness today?  The patients wife is wanting the patient to get into rehab and get better.    Goals Addressed             This Visit's Progress    RNCM: Effective Management of Health and well being       Care Coordination Interventions: Evaluation of current treatment plan related to AMS and chronic conditons and patient's adherence to plan as established by provider Advised patient to To call the [facility] for new questions or concerns Provided education to patient re: the care coordination program and the goals. The patient is currently in the hospital Discussed plans with patient for ongoing care management follow up and provided patient with direct contact information for care management team Advised patient to discuss questions, concerns, and new concerns with provider Assessed social determinant of health barriers The patient is currently inpatient at [facility]. Per his wife he will likely go to rehab. Will schedule an appointment out. Review of further outreaches.           SDOH assessments and interventions completed:  Yes  SDOH Interventions Today    Flowsheet Row Most Recent Value  SDOH Interventions   Food Insecurity Interventions Intervention Not Indicated  Housing Interventions Intervention Not Indicated  Transportation Interventions Intervention Not Indicated  Utilities Interventions Intervention Not Indicated        Care Coordination Interventions Activated:  Yes  Care Coordination Interventions:  Yes, provided   Follow up plan: Follow up call scheduled for 06-01-2022 at 1115 am    Encounter Outcome:  Pt. Visit Completed   Noreene Larsson RN, MSN,  [place]  Mobile: [phone]

## 2022-03-23 NOTE — Patient Instructions (Signed)
Visit Information  Thank you for taking time to visit with me today. Please don't hesitate to contact me if I can be of assistance to you.   Following are the goals we discussed today:   Goals Addressed             This Visit's Progress    RNCM: Effective Management of Health and well being       Care Coordination Interventions: Evaluation of current treatment plan related to AMS and chronic conditons and patient's adherence to plan as established by provider Advised patient to To call the [facility] for new questions or concerns Provided education to patient re: the care coordination program and the goals. The patient is currently in the hospital Discussed plans with patient for ongoing care management follow up and provided patient with direct contact information for care management team Advised patient to discuss questions, concerns, and new concerns with provider Assessed social determinant of health barriers The patient is currently inpatient at [facility]. Per his wife he will likely go to rehab. Will schedule an appointment out. Review of further outreaches.           Our next appointment is by telephone on 06-01-2022 at 1115 am  Please call the care guide team at [phone] if you need to cancel or reschedule your appointment.   If you are experiencing a Mental Health or [place] or need someone to talk to, please call the Suicide and Crisis Lifeline: 988 call the [place] National Suicide Prevention Lifeline: 567-363-5787 or TTY: 2152985350 TTY 470-008-5897) to talk to a trained counselor call 1-800-273-TALK (toll free, 24 hour hotline)  Patient verbalizes understanding of instructions and care plan provided today and agrees to view in [place]. Active MyChart status and patient understanding of how to access instructions and care plan via MyChart confirmed with patient.     Telephone follow up appointment with care management team member scheduled for:  06-01-2022 at 78 am  [place], MSN, [place]  Mobile: [phone]

## 2022-05-17 DIAGNOSIS — R4182 Altered mental status, unspecified: Secondary | ICD-10-CM | POA: Diagnosis not present

## 2022-05-17 DIAGNOSIS — G4733 Obstructive sleep apnea (adult) (pediatric): Secondary | ICD-10-CM | POA: Diagnosis not present

## 2022-05-17 DIAGNOSIS — N179 Acute kidney failure, unspecified: Secondary | ICD-10-CM | POA: Diagnosis not present

## 2022-05-17 DIAGNOSIS — B1910 Unspecified viral hepatitis B without hepatic coma: Secondary | ICD-10-CM | POA: Diagnosis not present

## 2022-05-17 DIAGNOSIS — Y830 Surgical operation with transplant of whole organ as the cause of abnormal reaction of the patient, or of later complication, without mention of misadventure at the time of the procedure: Secondary | ICD-10-CM | POA: Diagnosis not present

## 2022-05-17 DIAGNOSIS — R2243 Localized swelling, mass and lump, lower limb, bilateral: Secondary | ICD-10-CM | POA: Diagnosis not present

## 2022-05-17 DIAGNOSIS — R42 Dizziness and giddiness: Secondary | ICD-10-CM | POA: Diagnosis not present

## 2022-05-17 DIAGNOSIS — D638 Anemia in other chronic diseases classified elsewhere: Secondary | ICD-10-CM | POA: Diagnosis not present

## 2022-05-17 DIAGNOSIS — E612 Magnesium deficiency: Secondary | ICD-10-CM | POA: Diagnosis not present

## 2022-05-17 DIAGNOSIS — A0472 Enterocolitis due to Clostridium difficile, not specified as recurrent: Secondary | ICD-10-CM | POA: Diagnosis not present

## 2022-05-17 DIAGNOSIS — F419 Anxiety disorder, unspecified: Secondary | ICD-10-CM | POA: Diagnosis not present

## 2022-05-17 DIAGNOSIS — Z5181 Encounter for therapeutic drug level monitoring: Secondary | ICD-10-CM | POA: Diagnosis not present

## 2022-05-17 DIAGNOSIS — D649 Anemia, unspecified: Secondary | ICD-10-CM | POA: Diagnosis not present

## 2022-05-17 DIAGNOSIS — E8770 Fluid overload, unspecified: Secondary | ICD-10-CM | POA: Diagnosis not present

## 2022-05-17 DIAGNOSIS — N2581 Secondary hyperparathyroidism of renal origin: Secondary | ICD-10-CM | POA: Diagnosis not present

## 2022-05-17 DIAGNOSIS — Z944 Liver transplant status: Secondary | ICD-10-CM | POA: Diagnosis not present

## 2022-05-17 DIAGNOSIS — E46 Unspecified protein-calorie malnutrition: Secondary | ICD-10-CM | POA: Diagnosis not present

## 2022-05-17 DIAGNOSIS — T8643 Liver transplant infection: Secondary | ICD-10-CM | POA: Diagnosis not present

## 2022-05-17 DIAGNOSIS — I959 Hypotension, unspecified: Secondary | ICD-10-CM | POA: Diagnosis not present

## 2022-05-17 DIAGNOSIS — E441 Mild protein-calorie malnutrition: Secondary | ICD-10-CM | POA: Diagnosis not present

## 2022-05-17 DIAGNOSIS — D849 Immunodeficiency, unspecified: Secondary | ICD-10-CM | POA: Diagnosis not present

## 2022-05-17 DIAGNOSIS — E1122 Type 2 diabetes mellitus with diabetic chronic kidney disease: Secondary | ICD-10-CM | POA: Diagnosis not present

## 2022-05-17 DIAGNOSIS — R7889 Finding of other specified substances, not normally found in blood: Secondary | ICD-10-CM | POA: Diagnosis not present

## 2022-05-17 DIAGNOSIS — D631 Anemia in chronic kidney disease: Secondary | ICD-10-CM | POA: Diagnosis not present

## 2022-05-17 DIAGNOSIS — R278 Other lack of coordination: Secondary | ICD-10-CM | POA: Diagnosis not present

## 2022-05-17 DIAGNOSIS — Z4800 Encounter for change or removal of nonsurgical wound dressing: Secondary | ICD-10-CM | POA: Diagnosis not present

## 2022-05-17 DIAGNOSIS — F54 Psychological and behavioral factors associated with disorders or diseases classified elsewhere: Secondary | ICD-10-CM | POA: Diagnosis not present

## 2022-05-17 DIAGNOSIS — I10 Essential (primary) hypertension: Secondary | ICD-10-CM | POA: Diagnosis not present

## 2022-05-17 DIAGNOSIS — R296 Repeated falls: Secondary | ICD-10-CM | POA: Diagnosis not present

## 2022-05-17 DIAGNOSIS — R9431 Abnormal electrocardiogram [ECG] [EKG]: Secondary | ICD-10-CM | POA: Diagnosis not present

## 2022-05-17 DIAGNOSIS — Z7982 Long term (current) use of aspirin: Secondary | ICD-10-CM | POA: Diagnosis not present

## 2022-05-17 DIAGNOSIS — R466 Undue concern and preoccupation with stressful events: Secondary | ICD-10-CM | POA: Diagnosis not present

## 2022-05-17 DIAGNOSIS — Z743 Need for continuous supervision: Secondary | ICD-10-CM | POA: Diagnosis not present

## 2022-05-17 DIAGNOSIS — G4709 Other insomnia: Secondary | ICD-10-CM | POA: Diagnosis not present

## 2022-05-17 DIAGNOSIS — Z13228 Encounter for screening for other metabolic disorders: Secondary | ICD-10-CM | POA: Diagnosis not present

## 2022-05-17 DIAGNOSIS — D61818 Other pancytopenia: Secondary | ICD-10-CM | POA: Diagnosis not present

## 2022-05-17 DIAGNOSIS — Z79624 Long term (current) use of inhibitors of nucleotide synthesis: Secondary | ICD-10-CM | POA: Diagnosis not present

## 2022-05-17 DIAGNOSIS — Z992 Dependence on renal dialysis: Secondary | ICD-10-CM | POA: Diagnosis not present

## 2022-05-17 DIAGNOSIS — N183 Chronic kidney disease, stage 3 unspecified: Secondary | ICD-10-CM | POA: Diagnosis not present

## 2022-05-17 DIAGNOSIS — E119 Type 2 diabetes mellitus without complications: Secondary | ICD-10-CM | POA: Diagnosis not present

## 2022-05-17 DIAGNOSIS — F4321 Adjustment disorder with depressed mood: Secondary | ICD-10-CM | POA: Diagnosis not present

## 2022-05-17 DIAGNOSIS — N186 End stage renal disease: Secondary | ICD-10-CM | POA: Diagnosis not present

## 2022-05-18 DIAGNOSIS — Z992 Dependence on renal dialysis: Secondary | ICD-10-CM | POA: Diagnosis not present

## 2022-05-18 DIAGNOSIS — N179 Acute kidney failure, unspecified: Secondary | ICD-10-CM | POA: Diagnosis not present

## 2022-05-18 DIAGNOSIS — G4709 Other insomnia: Secondary | ICD-10-CM | POA: Diagnosis not present

## 2022-05-18 DIAGNOSIS — N2581 Secondary hyperparathyroidism of renal origin: Secondary | ICD-10-CM | POA: Diagnosis not present

## 2022-05-18 DIAGNOSIS — Z4800 Encounter for change or removal of nonsurgical wound dressing: Secondary | ICD-10-CM | POA: Diagnosis not present

## 2022-05-18 DIAGNOSIS — R296 Repeated falls: Secondary | ICD-10-CM | POA: Diagnosis not present

## 2022-05-18 DIAGNOSIS — D649 Anemia, unspecified: Secondary | ICD-10-CM | POA: Diagnosis not present

## 2022-05-18 DIAGNOSIS — F419 Anxiety disorder, unspecified: Secondary | ICD-10-CM | POA: Diagnosis not present

## 2022-05-18 DIAGNOSIS — F4321 Adjustment disorder with depressed mood: Secondary | ICD-10-CM | POA: Diagnosis not present

## 2022-05-18 DIAGNOSIS — G4733 Obstructive sleep apnea (adult) (pediatric): Secondary | ICD-10-CM | POA: Diagnosis not present

## 2022-05-18 DIAGNOSIS — N186 End stage renal disease: Secondary | ICD-10-CM | POA: Diagnosis not present

## 2022-05-20 DIAGNOSIS — N179 Acute kidney failure, unspecified: Secondary | ICD-10-CM | POA: Diagnosis not present

## 2022-05-20 DIAGNOSIS — N2581 Secondary hyperparathyroidism of renal origin: Secondary | ICD-10-CM | POA: Diagnosis not present

## 2022-05-20 DIAGNOSIS — Z992 Dependence on renal dialysis: Secondary | ICD-10-CM | POA: Diagnosis not present

## 2022-05-20 DIAGNOSIS — Z4800 Encounter for change or removal of nonsurgical wound dressing: Secondary | ICD-10-CM | POA: Diagnosis not present

## 2022-05-21 DIAGNOSIS — T8643 Liver transplant infection: Secondary | ICD-10-CM | POA: Diagnosis not present

## 2022-05-21 DIAGNOSIS — N186 End stage renal disease: Secondary | ICD-10-CM | POA: Diagnosis not present

## 2022-05-21 DIAGNOSIS — Z992 Dependence on renal dialysis: Secondary | ICD-10-CM | POA: Diagnosis not present

## 2022-05-21 DIAGNOSIS — R42 Dizziness and giddiness: Secondary | ICD-10-CM | POA: Diagnosis not present

## 2022-05-22 DIAGNOSIS — E1122 Type 2 diabetes mellitus with diabetic chronic kidney disease: Secondary | ICD-10-CM | POA: Diagnosis not present

## 2022-05-22 DIAGNOSIS — N183 Chronic kidney disease, stage 3 unspecified: Secondary | ICD-10-CM | POA: Diagnosis not present

## 2022-05-22 DIAGNOSIS — R9431 Abnormal electrocardiogram [ECG] [EKG]: Secondary | ICD-10-CM | POA: Diagnosis not present

## 2022-05-22 DIAGNOSIS — R42 Dizziness and giddiness: Secondary | ICD-10-CM | POA: Diagnosis not present

## 2022-05-22 DIAGNOSIS — D649 Anemia, unspecified: Secondary | ICD-10-CM | POA: Diagnosis not present

## 2022-05-24 DIAGNOSIS — N186 End stage renal disease: Secondary | ICD-10-CM | POA: Diagnosis not present

## 2022-05-24 DIAGNOSIS — N2581 Secondary hyperparathyroidism of renal origin: Secondary | ICD-10-CM | POA: Diagnosis not present

## 2022-05-24 DIAGNOSIS — N179 Acute kidney failure, unspecified: Secondary | ICD-10-CM | POA: Diagnosis not present

## 2022-05-24 DIAGNOSIS — Z992 Dependence on renal dialysis: Secondary | ICD-10-CM | POA: Diagnosis not present

## 2022-05-24 DIAGNOSIS — Z4800 Encounter for change or removal of nonsurgical wound dressing: Secondary | ICD-10-CM | POA: Diagnosis not present

## 2022-05-24 DIAGNOSIS — R42 Dizziness and giddiness: Secondary | ICD-10-CM | POA: Diagnosis not present

## 2022-05-24 DIAGNOSIS — D649 Anemia, unspecified: Secondary | ICD-10-CM | POA: Diagnosis not present

## 2022-05-25 DIAGNOSIS — D649 Anemia, unspecified: Secondary | ICD-10-CM | POA: Diagnosis not present

## 2022-05-25 DIAGNOSIS — T8643 Liver transplant infection: Secondary | ICD-10-CM | POA: Diagnosis not present

## 2022-05-25 DIAGNOSIS — N186 End stage renal disease: Secondary | ICD-10-CM | POA: Diagnosis not present

## 2022-05-25 DIAGNOSIS — R42 Dizziness and giddiness: Secondary | ICD-10-CM | POA: Diagnosis not present

## 2022-05-26 DIAGNOSIS — Z4800 Encounter for change or removal of nonsurgical wound dressing: Secondary | ICD-10-CM | POA: Diagnosis not present

## 2022-05-26 DIAGNOSIS — Z992 Dependence on renal dialysis: Secondary | ICD-10-CM | POA: Diagnosis not present

## 2022-05-26 DIAGNOSIS — N179 Acute kidney failure, unspecified: Secondary | ICD-10-CM | POA: Diagnosis not present

## 2022-05-26 DIAGNOSIS — N2581 Secondary hyperparathyroidism of renal origin: Secondary | ICD-10-CM | POA: Diagnosis not present

## 2022-05-27 DIAGNOSIS — Z7982 Long term (current) use of aspirin: Secondary | ICD-10-CM | POA: Diagnosis not present

## 2022-05-27 DIAGNOSIS — D631 Anemia in chronic kidney disease: Secondary | ICD-10-CM | POA: Diagnosis not present

## 2022-05-27 DIAGNOSIS — B1910 Unspecified viral hepatitis B without hepatic coma: Secondary | ICD-10-CM | POA: Diagnosis not present

## 2022-05-27 DIAGNOSIS — Y830 Surgical operation with transplant of whole organ as the cause of abnormal reaction of the patient, or of later complication, without mention of misadventure at the time of the procedure: Secondary | ICD-10-CM | POA: Diagnosis not present

## 2022-05-27 DIAGNOSIS — Z79624 Long term (current) use of inhibitors of nucleotide synthesis: Secondary | ICD-10-CM | POA: Diagnosis not present

## 2022-05-27 DIAGNOSIS — D638 Anemia in other chronic diseases classified elsewhere: Secondary | ICD-10-CM | POA: Diagnosis not present

## 2022-05-27 DIAGNOSIS — T8643 Liver transplant infection: Secondary | ICD-10-CM | POA: Diagnosis not present

## 2022-05-27 DIAGNOSIS — D849 Immunodeficiency, unspecified: Secondary | ICD-10-CM | POA: Diagnosis not present

## 2022-05-27 DIAGNOSIS — N183 Chronic kidney disease, stage 3 unspecified: Secondary | ICD-10-CM | POA: Diagnosis not present

## 2022-05-28 ENCOUNTER — Telehealth: Payer: Self-pay | Admitting: Family Medicine

## 2022-05-28 DIAGNOSIS — G8929 Other chronic pain: Secondary | ICD-10-CM | POA: Diagnosis not present

## 2022-05-28 DIAGNOSIS — G4700 Insomnia, unspecified: Secondary | ICD-10-CM | POA: Diagnosis not present

## 2022-05-28 DIAGNOSIS — F419 Anxiety disorder, unspecified: Secondary | ICD-10-CM | POA: Diagnosis not present

## 2022-05-28 DIAGNOSIS — I120 Hypertensive chronic kidney disease with stage 5 chronic kidney disease or end stage renal disease: Secondary | ICD-10-CM | POA: Diagnosis not present

## 2022-05-28 DIAGNOSIS — F32A Depression, unspecified: Secondary | ICD-10-CM | POA: Diagnosis not present

## 2022-05-28 DIAGNOSIS — G4733 Obstructive sleep apnea (adult) (pediatric): Secondary | ICD-10-CM | POA: Diagnosis not present

## 2022-05-28 DIAGNOSIS — K219 Gastro-esophageal reflux disease without esophagitis: Secondary | ICD-10-CM | POA: Diagnosis not present

## 2022-05-28 DIAGNOSIS — N186 End stage renal disease: Secondary | ICD-10-CM | POA: Diagnosis not present

## 2022-05-28 DIAGNOSIS — K7581 Nonalcoholic steatohepatitis (NASH): Secondary | ICD-10-CM | POA: Diagnosis not present

## 2022-05-28 DIAGNOSIS — Z23 Encounter for immunization: Secondary | ICD-10-CM | POA: Diagnosis not present

## 2022-05-28 DIAGNOSIS — N179 Acute kidney failure, unspecified: Secondary | ICD-10-CM | POA: Diagnosis not present

## 2022-05-28 DIAGNOSIS — N183 Chronic kidney disease, stage 3 unspecified: Secondary | ICD-10-CM | POA: Diagnosis not present

## 2022-05-28 DIAGNOSIS — E785 Hyperlipidemia, unspecified: Secondary | ICD-10-CM | POA: Diagnosis not present

## 2022-05-28 DIAGNOSIS — H908 Mixed conductive and sensorineural hearing loss, unspecified: Secondary | ICD-10-CM | POA: Diagnosis not present

## 2022-05-28 DIAGNOSIS — E1122 Type 2 diabetes mellitus with diabetic chronic kidney disease: Secondary | ICD-10-CM | POA: Diagnosis not present

## 2022-05-28 DIAGNOSIS — D696 Thrombocytopenia, unspecified: Secondary | ICD-10-CM | POA: Diagnosis not present

## 2022-05-28 DIAGNOSIS — N529 Male erectile dysfunction, unspecified: Secondary | ICD-10-CM | POA: Diagnosis not present

## 2022-05-28 DIAGNOSIS — E46 Unspecified protein-calorie malnutrition: Secondary | ICD-10-CM | POA: Diagnosis not present

## 2022-05-28 DIAGNOSIS — D631 Anemia in chronic kidney disease: Secondary | ICD-10-CM | POA: Diagnosis not present

## 2022-05-28 NOTE — Telephone Encounter (Signed)
yes

## 2022-05-28 NOTE — Telephone Encounter (Signed)
Returned call. Berton Lan is requesting clarification on orders.  "Is it OK to do the PT w/ the pt's hemoglobin is 7.1? Please advise?

## 2022-05-28 NOTE — Telephone Encounter (Signed)
  Home Health Verbal Orders - Caller/Agency: [facility]/ [facility] Number: [phone] Requesting nursing eval  Frequency: 1 wk 7 (if hemoglobin goes past 8)  PIease Clarify Orders:  it OK to do the PT w/ the pt's hemoglobin is 7.1?

## 2022-05-28 NOTE — Telephone Encounter (Signed)
Jesse Zimmerman was advised.

## 2022-05-28 NOTE — Telephone Encounter (Signed)
That's fine

## 2022-05-28 NOTE — Telephone Encounter (Signed)
Please advise 

## 2022-05-31 DIAGNOSIS — N179 Acute kidney failure, unspecified: Secondary | ICD-10-CM | POA: Diagnosis not present

## 2022-05-31 DIAGNOSIS — N183 Chronic kidney disease, stage 3 unspecified: Secondary | ICD-10-CM | POA: Diagnosis not present

## 2022-05-31 DIAGNOSIS — Z23 Encounter for immunization: Secondary | ICD-10-CM | POA: Diagnosis not present

## 2022-06-01 ENCOUNTER — Encounter: Payer: Self-pay | Admitting: *Deleted

## 2022-06-01 ENCOUNTER — Telehealth: Payer: Self-pay | Admitting: *Deleted

## 2022-06-01 DIAGNOSIS — N186 End stage renal disease: Secondary | ICD-10-CM | POA: Diagnosis not present

## 2022-06-01 DIAGNOSIS — H908 Mixed conductive and sensorineural hearing loss, unspecified: Secondary | ICD-10-CM | POA: Diagnosis not present

## 2022-06-01 DIAGNOSIS — F32A Depression, unspecified: Secondary | ICD-10-CM | POA: Diagnosis not present

## 2022-06-01 DIAGNOSIS — H2513 Age-related nuclear cataract, bilateral: Secondary | ICD-10-CM | POA: Diagnosis not present

## 2022-06-01 DIAGNOSIS — E46 Unspecified protein-calorie malnutrition: Secondary | ICD-10-CM | POA: Diagnosis not present

## 2022-06-01 DIAGNOSIS — K219 Gastro-esophageal reflux disease without esophagitis: Secondary | ICD-10-CM | POA: Diagnosis not present

## 2022-06-01 DIAGNOSIS — D631 Anemia in chronic kidney disease: Secondary | ICD-10-CM | POA: Diagnosis not present

## 2022-06-01 DIAGNOSIS — N529 Male erectile dysfunction, unspecified: Secondary | ICD-10-CM | POA: Diagnosis not present

## 2022-06-01 DIAGNOSIS — G4700 Insomnia, unspecified: Secondary | ICD-10-CM | POA: Diagnosis not present

## 2022-06-01 DIAGNOSIS — E113413 Type 2 diabetes mellitus with severe nonproliferative diabetic retinopathy with macular edema, bilateral: Secondary | ICD-10-CM | POA: Diagnosis not present

## 2022-06-01 DIAGNOSIS — I120 Hypertensive chronic kidney disease with stage 5 chronic kidney disease or end stage renal disease: Secondary | ICD-10-CM | POA: Diagnosis not present

## 2022-06-01 DIAGNOSIS — E1122 Type 2 diabetes mellitus with diabetic chronic kidney disease: Secondary | ICD-10-CM | POA: Diagnosis not present

## 2022-06-01 DIAGNOSIS — D696 Thrombocytopenia, unspecified: Secondary | ICD-10-CM | POA: Diagnosis not present

## 2022-06-01 DIAGNOSIS — E785 Hyperlipidemia, unspecified: Secondary | ICD-10-CM | POA: Diagnosis not present

## 2022-06-01 DIAGNOSIS — F419 Anxiety disorder, unspecified: Secondary | ICD-10-CM | POA: Diagnosis not present

## 2022-06-01 DIAGNOSIS — G8929 Other chronic pain: Secondary | ICD-10-CM | POA: Diagnosis not present

## 2022-06-01 DIAGNOSIS — H3581 Retinal edema: Secondary | ICD-10-CM | POA: Diagnosis not present

## 2022-06-01 DIAGNOSIS — K7581 Nonalcoholic steatohepatitis (NASH): Secondary | ICD-10-CM | POA: Diagnosis not present

## 2022-06-01 DIAGNOSIS — G4733 Obstructive sleep apnea (adult) (pediatric): Secondary | ICD-10-CM | POA: Diagnosis not present

## 2022-06-01 LAB — HM DIABETES EYE EXAM

## 2022-06-01 NOTE — Patient Outreach (Signed)
  Care Coordination   Follow Up Visit Note   06/02/2022 Name: Jesse Zimmerman MRN: [id number] DOB: 10-21-1960  Jesse Zimmerman is a 61 y.o. year old male who sees Jerrol Banana., MD for primary care. I spoke with  Jesse Zimmerman by phone today.  What matters to the patients health and wellness today?  Recent discharge from rehab center.  State he is still weak and needing additional help. Able to walk some with walker but unable to stand independently.  Uses a lift chair when needed.     Goals Addressed             This Visit's Progress    RNCM: Effective Management of Health and well being   On track    Care Coordination Interventions: Evaluation of current treatment plan related to AMS and chronic conditions such as recovery from liver transplant of 6/4 and patient's adherence to plan as established by provider Advised patient to discuss wife's difficulty with lifting patient  from chair with PT/OT.  Encouraged to inquire about safe patient handling techniques Provided education to patient re: the care coordination program and the goals. The patient recently discharged from rehab center last week Discussed plans with patient for ongoing care management follow up and provided patient with direct contact information for care management team Advised patient to discuss questions regarding ongoing weakness with provider Assessed social determinant of health barriers The patient expresses concern regarding need for increased help in the home.  Wife works from home but has limited ability to help during the day.  He is interested in hiring a private duty nurse to help with ADLs Has follow up with GI specialist via video call on Thursday, HD sessions MWF Active with [facility] for PT/OT and nursing           SDOH assessments and interventions completed:  No     Care Coordination Interventions:  Yes, provided   Follow up plan: Follow up call scheduled for 12/27     Encounter Outcome:  Pt. Visit Completed   Valente David, RN, MSN, [place] Care Management Care Management Coordinator [phone]

## 2022-06-01 NOTE — Telephone Encounter (Signed)
Copied from [place] [id number]. Topic: Quick Communication - Home Health Verbal Orders >> Jun 01, 2022  1:52 PM Leilani Able wrote: Caller/Agency: [facility] Number: [phone] Requesting OT Frequency: 1 time a week for 6 wk

## 2022-06-02 DIAGNOSIS — N179 Acute kidney failure, unspecified: Secondary | ICD-10-CM | POA: Diagnosis not present

## 2022-06-02 DIAGNOSIS — N183 Chronic kidney disease, stage 3 unspecified: Secondary | ICD-10-CM | POA: Diagnosis not present

## 2022-06-02 DIAGNOSIS — Z23 Encounter for immunization: Secondary | ICD-10-CM | POA: Diagnosis not present

## 2022-06-02 NOTE — Patient Instructions (Signed)
Visit Information  Thank you for taking time to visit with me today. Please don't hesitate to contact me if I can be of assistance to you before our next scheduled telephone appointment.  Following are the goals we discussed today:  Consider private pay agencies to help with care in the home.  Stay active with home health for increased strength.  Our next appointment is by telephone on 12/27  Please call the care guide team at [phone] if you need to cancel or reschedule your appointment.   Please call the Suicide and Crisis Lifeline: 988 call the [place] National Suicide Prevention Lifeline: 469-602-1492 or TTY: 5702476242 TTY (401)020-6507) to talk to a trained counselor call 1-800-273-TALK (toll free, 24 hour hotline) call 911 if you are experiencing a Mental Health or [place] or need someone to talk to.  Patient verbalizes understanding of instructions and care plan provided today and agrees to view in [place]. Active MyChart status and patient understanding of how to access instructions and care plan via MyChart confirmed with patient.     The patient has been provided with contact information for the care management team and has been advised to call with any health related questions or concerns.   Valente David, RN, MSN, [place] Care Management Care Management Coordinator [phone]

## 2022-06-03 ENCOUNTER — Ambulatory Visit (INDEPENDENT_AMBULATORY_CARE_PROVIDER_SITE_OTHER): Payer: BC Managed Care – PPO | Admitting: Physician Assistant

## 2022-06-03 ENCOUNTER — Inpatient Hospital Stay: Payer: BC Managed Care – PPO | Admitting: Physician Assistant

## 2022-06-03 ENCOUNTER — Encounter: Payer: Self-pay | Admitting: Physician Assistant

## 2022-06-03 VITALS — BP 118/71 | HR 98 | Ht 72.0 in

## 2022-06-03 DIAGNOSIS — I120 Hypertensive chronic kidney disease with stage 5 chronic kidney disease or end stage renal disease: Secondary | ICD-10-CM | POA: Diagnosis not present

## 2022-06-03 DIAGNOSIS — L089 Local infection of the skin and subcutaneous tissue, unspecified: Secondary | ICD-10-CM

## 2022-06-03 DIAGNOSIS — E785 Hyperlipidemia, unspecified: Secondary | ICD-10-CM | POA: Diagnosis not present

## 2022-06-03 DIAGNOSIS — G4733 Obstructive sleep apnea (adult) (pediatric): Secondary | ICD-10-CM | POA: Diagnosis not present

## 2022-06-03 DIAGNOSIS — N186 End stage renal disease: Secondary | ICD-10-CM

## 2022-06-03 DIAGNOSIS — E46 Unspecified protein-calorie malnutrition: Secondary | ICD-10-CM

## 2022-06-03 DIAGNOSIS — E1122 Type 2 diabetes mellitus with diabetic chronic kidney disease: Secondary | ICD-10-CM | POA: Diagnosis not present

## 2022-06-03 DIAGNOSIS — K7581 Nonalcoholic steatohepatitis (NASH): Secondary | ICD-10-CM | POA: Diagnosis not present

## 2022-06-03 DIAGNOSIS — R5383 Other fatigue: Secondary | ICD-10-CM | POA: Diagnosis not present

## 2022-06-03 DIAGNOSIS — K219 Gastro-esophageal reflux disease without esophagitis: Secondary | ICD-10-CM | POA: Diagnosis not present

## 2022-06-03 DIAGNOSIS — D696 Thrombocytopenia, unspecified: Secondary | ICD-10-CM | POA: Diagnosis not present

## 2022-06-03 DIAGNOSIS — R5381 Other malaise: Secondary | ICD-10-CM

## 2022-06-03 DIAGNOSIS — D849 Immunodeficiency, unspecified: Secondary | ICD-10-CM

## 2022-06-03 DIAGNOSIS — T07XXXA Unspecified multiple injuries, initial encounter: Secondary | ICD-10-CM

## 2022-06-03 DIAGNOSIS — F419 Anxiety disorder, unspecified: Secondary | ICD-10-CM | POA: Diagnosis not present

## 2022-06-03 DIAGNOSIS — N529 Male erectile dysfunction, unspecified: Secondary | ICD-10-CM | POA: Diagnosis not present

## 2022-06-03 DIAGNOSIS — L89300 Pressure ulcer of unspecified buttock, unstageable: Secondary | ICD-10-CM

## 2022-06-03 DIAGNOSIS — D631 Anemia in chronic kidney disease: Secondary | ICD-10-CM | POA: Diagnosis not present

## 2022-06-03 DIAGNOSIS — Z944 Liver transplant status: Secondary | ICD-10-CM | POA: Diagnosis not present

## 2022-06-03 DIAGNOSIS — L0390 Cellulitis, unspecified: Secondary | ICD-10-CM

## 2022-06-03 DIAGNOSIS — F32A Depression, unspecified: Secondary | ICD-10-CM | POA: Diagnosis not present

## 2022-06-03 DIAGNOSIS — H908 Mixed conductive and sensorineural hearing loss, unspecified: Secondary | ICD-10-CM | POA: Diagnosis not present

## 2022-06-03 DIAGNOSIS — D638 Anemia in other chronic diseases classified elsewhere: Secondary | ICD-10-CM

## 2022-06-03 DIAGNOSIS — G8929 Other chronic pain: Secondary | ICD-10-CM | POA: Diagnosis not present

## 2022-06-03 DIAGNOSIS — R296 Repeated falls: Secondary | ICD-10-CM

## 2022-06-03 DIAGNOSIS — G4700 Insomnia, unspecified: Secondary | ICD-10-CM | POA: Diagnosis not present

## 2022-06-03 DIAGNOSIS — Z992 Dependence on renal dialysis: Secondary | ICD-10-CM

## 2022-06-03 NOTE — Progress Notes (Incomplete Revision)
I,[name],acting as a Education administrator for [employer], PA-C.,have documented all relevant documentation on the behalf of Mardene Speak, PA-C,as directed by  [employer], PA-C while in the presence of [employer], PA-C.  Established patient visit   Patient: Jesse Zimmerman   DOB: 12/07/1960   61 y.o. Male  MRN: [id number] Visit Date: 06/03/2022  Today's healthcare provider: Mardene Speak, PA-C   No chief complaint on file.  Subjective    HPI  Patient is being seen today to inquire about home health . His current medical conditions include s/p liver transplant from donor with hep B, hypomagnesemia, anemia in chronic illness, ESRD on dialysis. Patient states that he has been in hospital for the past 6 mo. Was discharged on 05/27/22 and went to rehab. He was discharged from rehab yesterday? Presents to our clinic for a referral to [facility]. Reports that his medications were messed up in [facility] and rehab Reports that he saw [facility] nurse yesterday and he was advised to order [facility].  He has been having problem with L arm injuries after his fall in rehab. He requests antidepressants and needs care for lower back/buttocks/neck sores as well as his debility, protein-calorie malnutrition, frequent falls.  Medications: Outpatient Medications Prior to Visit  Medication Sig  . acyclovir (ZOVIRAX) 200 MG capsule Take by mouth.  . Calcium Citrate-Vitamin D3 315-6.25 MG-MCG TABS Take by mouth.  . cycloSPORINE modified (NEORAL) 25 MG capsule Take by mouth.  . midodrine (PROAMATINE) 5 MG tablet Take by mouth.  . mirtazapine (REMERON) 15 MG tablet Take by mouth.  . mycophenolate (CELLCEPT) 250 MG capsule Take by mouth.  . ondansetron (ZOFRAN) 8 MG tablet Take by mouth.  Marland Kitchen PREVYMIS 480 MG TABS Take 1 tablet by mouth daily.  Marland Kitchen tenofovir (VIREAD) 300 MG tablet Take by mouth.  . traZODone (DESYREL) 50 MG tablet Take by mouth.  Marland Kitchen acetaminophen (TYLENOL) 325 MG tablet Take by mouth. (Patient not taking:  Reported on 06/03/2022)  . albuterol (VENTOLIN HFA) 108 (90 Base) MCG/ACT inhaler Inhale 2 puffs into the lungs every 6 (six) hours as needed for wheezing or shortness of breath. (Patient not taking: Reported on 06/03/2022)  . aspirin EC 81 MG tablet Take by mouth. (Patient not taking: Reported on 06/03/2022)  . benzonatate (TESSALON) 100 MG capsule Take 1 capsule (100 mg total) by mouth 2 (two) times daily as needed for cough. (Patient not taking: Reported on 06/03/2022)  . blood glucose meter kit and supplies Dispense based on patient and insurance preference. Use once daily as directed. (FOR ICD-10 E11.69). (Patient not taking: Reported on 06/03/2022)  . capsaicin (ZOSTRIX) 0.025 % cream Apply 1 Application topically 2 (two) times daily. (Patient not taking: Reported on 06/03/2022)  . cetirizine (ZYRTEC) 10 MG tablet Take 1 tablet (10 mg total) by mouth daily. (Patient not taking: Reported on 06/03/2022)  . clotrimazole (MYCELEX) 10 MG troche  (Patient not taking: Reported on 06/03/2022)  . Continuous Blood Gluc Receiver (FREESTYLE LIBRE 2 READER) DEVI 1 each by Does not apply route daily. (Patient not taking: Reported on 06/03/2022)  . Continuous Blood Gluc Sensor (FREESTYLE LIBRE 2 SENSOR) MISC 1 each by Does not apply route daily. (Patient not taking: Reported on 06/03/2022)  . diazepam (VALIUM) 2 MG tablet Take 2 mg by mouth 2 (two) times daily. (Patient not taking: Reported on 06/03/2022)  . Dulaglutide (TRULICITY) 1.5 EX/5.1ZG SOPN Inject 1.5 mg into the skin once a week. (Patient not taking: Reported on 06/03/2022)  .  ferrous sulfate 325 (65 FE) MG EC tablet Take 1 tablet by mouth every morning. (Patient not taking: Reported on 06/03/2022)  . fluticasone (FLONASE) 50 MCG/ACT nasal spray Place 2 sprays into both nostrils daily. (Patient not taking: Reported on 06/03/2022)  . furosemide (LASIX) 40 MG tablet Take 1 tablet (40 mg total) by mouth daily. (Patient not taking: Reported on 06/03/2022)   . gabapentin (NEURONTIN) 100 MG capsule Take 1 capsule (100 mg total) by mouth at bedtime. (Patient not taking: Reported on 06/03/2022)  . hydrOXYzine (VISTARIL) 25 MG capsule Take 25 mg by mouth 2 (two) times daily. (Patient not taking: Reported on 06/03/2022)  . insulin regular (HUMULIN R) 100 units/mL injection 6 units with meals plus correction 1 unit for every 50pts above 150. TDD up to 36 units daily. Please dispense pens. (Patient not taking: Reported on 06/03/2022)  . JARDIANCE 25 MG TABS tablet TAKE 1 TABLET BY MOUTH DAILY (Patient not taking: Reported on 06/03/2022)  . lactulose (CHRONULAC) 10 GM/15ML solution Take 30 mLs (20 g total) by mouth daily. (Patient not taking: Reported on 06/03/2022)  . loratadine (CLARITIN) 10 MG tablet Take 1 tablet (10 mg total) by mouth daily. (Patient not taking: Reported on 06/03/2022)  . omeprazole (PRILOSEC) 20 MG capsule Take 1 capsule (20 mg total) by mouth daily. (Patient not taking: Reported on 06/03/2022)  . ondansetron (ZOFRAN-ODT) 4 MG disintegrating tablet Take 1 tablet (4 mg total) by mouth every 8 (eight) hours as needed for nausea or vomiting. (Patient not taking: Reported on 06/03/2022)  . predniSONE (DELTASONE) 5 MG tablet Take by mouth 3 (three) times daily. (Patient not taking: Reported on 06/03/2022)  . propranolol (INDERAL) 20 MG tablet Take 1 tablet (20 mg total) by mouth 2 (two) times daily. (Patient not taking: Reported on 06/03/2022)  . sertraline (ZOLOFT) 50 MG tablet TAKE 1 TABLET(50 MG) BY MOUTH DAILY (Patient not taking: Reported on 06/03/2022)  . spironolactone (ALDACTONE) 100 MG tablet TAKE ONE-HALF TABLET BY  MOUTH daily (Patient not taking: Reported on 06/03/2022)  . TRULICITY 4.40 NU/2.7OZ SOPN INJECT THE CONTENTS OF ONE  PEN SUBCUTANEOUSLY WEEKLY  AS DIRECTED (Patient not taking: Reported on 06/03/2022)  . valGANciclovir (VALCYTE) 450 MG tablet Take by mouth. (Patient not taking: Reported on 06/03/2022)   No  facility-administered medications prior to visit.    ROS See HPI    Objective    BP 118/71 (BP Location: Left Arm, Patient Position: Sitting, Cuff Size: Normal)   Pulse 98   Ht 6' (1.829 m)   SpO2 100%   BMI 42.86 kg/m    Physical Exam Vitals reviewed.  Constitutional:      General: He is not in acute distress.    Appearance: Normal appearance. He is not diaphoretic.  HENT:     Head: Normocephalic and atraumatic.     Nose: Nose normal.  Eyes:     General: No scleral icterus.    Conjunctiva/sclera: Conjunctivae normal.  Cardiovascular:     Rate and Rhythm: Normal rate and regular rhythm.     Pulses: Normal pulses.     Heart sounds: Normal heart sounds. No murmur heard. Pulmonary:     Effort: Pulmonary effort is normal. No respiratory distress.     Breath sounds: Normal breath sounds. No wheezing or rhonchi.  Musculoskeletal:     Cervical back: Neck supple.     Right lower leg: No edema.     Left lower leg: No edema.  Lymphadenopathy:     Cervical:  No cervical adenopathy.  Skin:    General: Skin is warm and dry.     Findings: No rash.     Comments: Multiple abrasion on the right forearm and 10+ cm abrasion on the left forearm, skin around abrasions  with erythema, swelling and warmth.  Neurological:     Mental Status: He is alert and oriented to person, place, and time. Mental status is at baseline.  Psychiatric:        Behavior: Behavior normal.        Thought Content: Thought content normal.        Judgment: Judgment normal.    Ambulates with a walker? Sitting in wheelchair  No results found for any visits on 06/03/22.  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  Status post liver transplant ([place]) from donor with hepatitis B Obesity, Class III, BMI 40-49.9 (morbid obesity) (HCC) Anemia, in chronic illness Per chart review, was  seen at the ED/[facility] for his hemoglobin level of 7.1. Recommended that at that time he did not require transfusion and no additional abnormalities were found. He was recommended to fu with transplant team and other specialists. Fatigue, unspecified type Immunosuppressed status Hypomagnesemia Hx of removal of biliary stent on 02/02/22 Hx of liver transplant complication Hx of hospitalization with acute C. Difficile infection from 03/11/22 Per medication dispense hx pt was dispensed bactrim on 12/01/21, cipro on 01/08/22, vancomycin on 02/20/22 Normal vitals today Pt was scheduled with [facility] hematology on 06/09/22, [place] center for metabolic and weight loss surgery on 06/10/22, [facility] kidney transplant on 07/01/22, liver clinic on 07/01/21 [facility] referral for skilled nursing, social worker, PT/OT, medication management was placed, per pt request. However, pt expressed his worries regarding his insurance coverage. Was advised to contact his insurance first. Pt was advised that medications that he requested for a refill have refills and will be managed by specialists and transplant team? Pt was advised to schedule a FU with Dr. Josephina Shih. Quentin Cornwall next week. Pt was prescribed an abx/doxycycline 163m x 10 days for infection after discussing his case with my supervising physician. [facility]  Per chart review Pt [facility] for AMS/hallucinations, Acute on chronic kidney disease with volume overload/dialysis was [facility]14 [facility] Pt was prescribed midodrine 56mon dialysis days;  Pancytopenia/lst transfusion on 11/18; repeat CBC weekly; Protein-calorie malnutrition, hx of sleeve gastrectomy/might revisit DHT placement as an option if PO intake continues to decline but stable, advised to continue taking magnesium oxide, B6, B1, FA; multiple falls;  HAP, was treated with cefepime;   OSA and persistent hypercarbia, on CPAP;  Anxiety, insomnia, depressed mood on trazodone, Remeron 15 mg 11/11 and fu with outpatient transplant psychology team; GERD, on pepcid; Dysgeusia, on folate and B1 On ASA 8115mor HAT ppx Received periprocedural abx(11/21-11/26) for ERCP on 11/21 for transplant anastomotic stricture Prophylaxis of HSV ppx/acyclovir, CMV ppx/monitoring for CMV HBV ppx/tenofovir 300m70mekly Last labs from 05/27/22 non reactive for CMV, Hep B core AB, Hep B DNA Pt was last seen by PCP on 02/01/22 for edema and CKD stage 3b. Pt restarted lasix 40mg42mich was stopped to protect renal function.  The patient was advised to call back or seek an in-person evaluation if the symptoms worsen or  if the condition fails to improve as anticipated.  I discussed the assessment and treatment plan with the patient. The patient was provided an opportunity to ask questions and all were answered. The patient agreed with the plan and demonstrated an understanding of the instructions.  The entirety of the information documented in the History of Present Illness, Review of Systems and Physical Exam were personally obtained by me. Portions of this information were initially documented by the CMA and reviewed by me for thoroughness and accuracy.  I spent 33  minutes dedicated to the care of this patient on the date of this encounter to include pre-visit review of records, face-to-face with the patient discussing his current condition, co morbidities, medication refills /treatment , and post visit ordering of testing, referrals.  Mardene Speak, [facility], [place] [phone] (phone) [phone] (fax)

## 2022-06-03 NOTE — Progress Notes (Signed)
I,[name],acting as a Education administrator for [employer], PA-C.,have documented all relevant documentation on the behalf of Mardene Speak, PA-C,as directed by  [employer], PA-C while in the presence of [employer], PA-C.  Established patient visit   Patient: Jesse Zimmerman   DOB: [birth year]   61 y.o. Male  MRN: [id number] Visit Date: 06/03/2022  Today's healthcare provider: Mardene Speak, PA-C   No chief complaint on file.  Subjective    HPI  Patient is being seen today to inquire about home health after receiving liver transplant from donor with hepatitis B. His current medical conditions include hypomagnesemia, anemia in chronic illness. Patient states that he has been in hospital for the past 6 mo. Was discharged on 05/27/22 and went to rehab. He was discharged from rehab yesterday? Presents to our clinic for [facility]. Reports that his medications were missed up in [facility] and rehab Reports that he saw [facility] nurse yesterday and he was advised to order [facility].  He has been having problem with L arm injuries after his fall in rehab. He requests antidepressants and needs care for lower back/buttocks/neck sore  Medications: Outpatient Medications Prior to Visit  Medication Sig  . acyclovir (ZOVIRAX) 200 MG capsule Take by mouth.  . Calcium Citrate-Vitamin D3 315-6.25 MG-MCG TABS Take by mouth.  . cycloSPORINE modified (NEORAL) 25 MG capsule Take by mouth.  . midodrine (PROAMATINE) 5 MG tablet Take by mouth.  . mirtazapine (REMERON) 15 MG tablet Take by mouth.  . mycophenolate (CELLCEPT) 250 MG capsule Take by mouth.  . ondansetron (ZOFRAN) 8 MG tablet Take by mouth.  Marland Kitchen PREVYMIS 480 MG TABS Take 1 tablet by mouth daily.  Marland Kitchen tenofovir (VIREAD) 300 MG tablet Take by mouth.  . traZODone (DESYREL) 50 MG tablet Take by mouth.  Marland Kitchen acetaminophen (TYLENOL) 325 MG tablet Take by mouth. (Patient not taking: Reported on 06/03/2022)  . albuterol (VENTOLIN HFA) 108 (90 Base) MCG/ACT inhaler  Inhale 2 puffs into the lungs every 6 (six) hours as needed for wheezing or shortness of breath. (Patient not taking: Reported on 06/03/2022)  . aspirin EC 81 MG tablet Take by mouth. (Patient not taking: Reported on 06/03/2022)  . benzonatate (TESSALON) 100 MG capsule Take 1 capsule (100 mg total) by mouth 2 (two) times daily as needed for cough. (Patient not taking: Reported on 06/03/2022)  . blood glucose meter kit and supplies Dispense based on patient and insurance preference. Use once daily as directed. (FOR ICD-10 E11.69). (Patient not taking: Reported on 06/03/2022)  . capsaicin (ZOSTRIX) 0.025 % cream Apply 1 Application topically 2 (two) times daily. (Patient not taking: Reported on 06/03/2022)  . cetirizine (ZYRTEC) 10 MG tablet Take 1 tablet (10 mg total) by mouth daily. (Patient not taking: Reported on 06/03/2022)  . clotrimazole (MYCELEX) 10 MG troche  (Patient not taking: Reported on 06/03/2022)  . Continuous Blood Gluc Receiver (FREESTYLE LIBRE 2 READER) DEVI 1 each by Does not apply route daily. (Patient not taking: Reported on 06/03/2022)  . Continuous Blood Gluc Sensor (FREESTYLE LIBRE 2 SENSOR) MISC 1 each by Does not apply route daily. (Patient not taking: Reported on 06/03/2022)  . diazepam (VALIUM) 2 MG tablet Take 2 mg by mouth 2 (two) times daily. (Patient not taking: Reported on 06/03/2022)  . Dulaglutide (TRULICITY) 1.5 EF/0.0FH SOPN Inject 1.5 mg into the skin once a week. (Patient not taking: Reported on 06/03/2022)  . ferrous sulfate 325 (65 FE) MG EC tablet Take 1 tablet by mouth  every morning. (Patient not taking: Reported on 06/03/2022)  . fluticasone (FLONASE) 50 MCG/ACT nasal spray Place 2 sprays into both nostrils daily. (Patient not taking: Reported on 06/03/2022)  . furosemide (LASIX) 40 MG tablet Take 1 tablet (40 mg total) by mouth daily. (Patient not taking: Reported on 06/03/2022)  . gabapentin (NEURONTIN) 100 MG capsule Take 1 capsule (100 mg total) by mouth  at bedtime. (Patient not taking: Reported on 06/03/2022)  . hydrOXYzine (VISTARIL) 25 MG capsule Take 25 mg by mouth 2 (two) times daily. (Patient not taking: Reported on 06/03/2022)  . insulin regular (HUMULIN R) 100 units/mL injection 6 units with meals plus correction 1 unit for every 50pts above 150. TDD up to 36 units daily. Please dispense pens. (Patient not taking: Reported on 06/03/2022)  . JARDIANCE 25 MG TABS tablet TAKE 1 TABLET BY MOUTH DAILY (Patient not taking: Reported on 06/03/2022)  . lactulose (CHRONULAC) 10 GM/15ML solution Take 30 mLs (20 g total) by mouth daily. (Patient not taking: Reported on 06/03/2022)  . loratadine (CLARITIN) 10 MG tablet Take 1 tablet (10 mg total) by mouth daily. (Patient not taking: Reported on 06/03/2022)  . omeprazole (PRILOSEC) 20 MG capsule Take 1 capsule (20 mg total) by mouth daily. (Patient not taking: Reported on 06/03/2022)  . ondansetron (ZOFRAN-ODT) 4 MG disintegrating tablet Take 1 tablet (4 mg total) by mouth every 8 (eight) hours as needed for nausea or vomiting. (Patient not taking: Reported on 06/03/2022)  . predniSONE (DELTASONE) 5 MG tablet Take by mouth 3 (three) times daily. (Patient not taking: Reported on 06/03/2022)  . propranolol (INDERAL) 20 MG tablet Take 1 tablet (20 mg total) by mouth 2 (two) times daily. (Patient not taking: Reported on 06/03/2022)  . sertraline (ZOLOFT) 50 MG tablet TAKE 1 TABLET(50 MG) BY MOUTH DAILY (Patient not taking: Reported on 06/03/2022)  . spironolactone (ALDACTONE) 100 MG tablet TAKE ONE-HALF TABLET BY  MOUTH daily (Patient not taking: Reported on 06/03/2022)  . TRULICITY 8.33 XO/3.2NV SOPN INJECT THE CONTENTS OF ONE  PEN SUBCUTANEOUSLY WEEKLY  AS DIRECTED (Patient not taking: Reported on 06/03/2022)  . valGANciclovir (VALCYTE) 450 MG tablet Take by mouth. (Patient not taking: Reported on 06/03/2022)   No facility-administered medications prior to visit.    Review of Systems  All other systems  reviewed and are negative.  See HPI {Labs  Heme  Chem  Endocrine  Serology  Results Review (optional):23779}   Objective    BP 118/71 (BP Location: Left Arm, Patient Position: Sitting, Cuff Size: Normal)   Pulse 98   Ht 6' (1.829 m)   SpO2 100%   BMI 42.86 kg/m  {Show previous vital signs (optional):23777}  Physical Exam Vitals reviewed.  Constitutional:      General: He is not in acute distress.    Appearance: Normal appearance. He is not diaphoretic.  HENT:     Head: Normocephalic and atraumatic.     Nose: Nose normal.  Eyes:     General: No scleral icterus.    Conjunctiva/sclera: Conjunctivae normal.  Cardiovascular:     Rate and Rhythm: Normal rate and regular rhythm.     Pulses: Normal pulses.     Heart sounds: Normal heart sounds. No murmur heard. Pulmonary:     Effort: Pulmonary effort is normal. No respiratory distress.     Breath sounds: Normal breath sounds. No wheezing or rhonchi.  Musculoskeletal:     Cervical back: Neck supple.     Right lower leg: No edema.  Left lower leg: No edema.  Lymphadenopathy:     Cervical: No cervical adenopathy.  Skin:    General: Skin is warm and dry.     Findings: No rash.     Comments: Multiple abrasion on the right forearm and 10+ cm abrasion on the left forearm, skin around abrasions  with erythema, swelling and warmth.  Neurological:     Mental Status: He is alert and oriented to person, place, and time. Mental status is at baseline.  Psychiatric:        Behavior: Behavior normal.        Thought Content: Thought content normal.        Judgment: Judgment normal.    Ambulates with a walker? Sitting in  wheelchair  No results found for any visits on 06/03/22.  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 Was seen at ED on 05/22/22 for concerning progression of anemia below transfusion  threshold. Labs were obtained: hemoglobin was 7.1 without additional significant electrolyte abnormalities or elevated creatinine. Pt was asymptomatic and was discharged. Status post liver transplant ([place]) Obesity, Class III, BMI 40-49.9 (morbid obesity) (HCC) Anemia, in chronic illness Fatigue, unspecified type Immunosuppressed status Hypomagnesemia Hx of removal of biliary stent Hx of liver transplant complication Hx of hospitalization with acute C. Difficile infection Normal vitals Pt was scheduled with [facility] hematology on 06/09/22, [place] center for metabolic and weight loss surgery on 06/10/22, [facility] kidney transplant on 07/01/22, liver clinic on 07/01/21 [facility] referral was placed  The patient was advised to call back or seek an in-person evaluation if the symptoms worsen or if the condition fails to improve as anticipated.  I discussed the assessment and treatment plan with the patient. The patient was provided an opportunity to ask questions and all were answered. The patient agreed with the plan and demonstrated an understanding of the instructions.  The entirety of the information documented in the History of Present Illness, Review of Systems and Physical Exam were personally obtained by me. Portions of this information were initially documented by the CMA and reviewed by me for thoroughness and accuracy.  Mardene Speak, [facility], [place] [phone] (phone) [phone] (fax)

## 2022-06-04 DIAGNOSIS — N186 End stage renal disease: Secondary | ICD-10-CM | POA: Diagnosis not present

## 2022-06-04 DIAGNOSIS — D696 Thrombocytopenia, unspecified: Secondary | ICD-10-CM

## 2022-06-04 DIAGNOSIS — E1122 Type 2 diabetes mellitus with diabetic chronic kidney disease: Secondary | ICD-10-CM | POA: Diagnosis not present

## 2022-06-04 DIAGNOSIS — D631 Anemia in chronic kidney disease: Secondary | ICD-10-CM | POA: Diagnosis not present

## 2022-06-04 DIAGNOSIS — N179 Acute kidney failure, unspecified: Secondary | ICD-10-CM | POA: Diagnosis not present

## 2022-06-04 DIAGNOSIS — G8929 Other chronic pain: Secondary | ICD-10-CM

## 2022-06-04 DIAGNOSIS — K7581 Nonalcoholic steatohepatitis (NASH): Secondary | ICD-10-CM

## 2022-06-04 DIAGNOSIS — Z23 Encounter for immunization: Secondary | ICD-10-CM | POA: Diagnosis not present

## 2022-06-04 DIAGNOSIS — N183 Chronic kidney disease, stage 3 unspecified: Secondary | ICD-10-CM | POA: Diagnosis not present

## 2022-06-04 DIAGNOSIS — D638 Anemia in other chronic diseases classified elsewhere: Secondary | ICD-10-CM | POA: Insufficient documentation

## 2022-06-04 DIAGNOSIS — Z944 Liver transplant status: Secondary | ICD-10-CM | POA: Insufficient documentation

## 2022-06-04 DIAGNOSIS — F419 Anxiety disorder, unspecified: Secondary | ICD-10-CM

## 2022-06-04 DIAGNOSIS — N529 Male erectile dysfunction, unspecified: Secondary | ICD-10-CM

## 2022-06-04 DIAGNOSIS — D849 Immunodeficiency, unspecified: Secondary | ICD-10-CM | POA: Insufficient documentation

## 2022-06-04 DIAGNOSIS — F32A Depression, unspecified: Secondary | ICD-10-CM

## 2022-06-04 DIAGNOSIS — I120 Hypertensive chronic kidney disease with stage 5 chronic kidney disease or end stage renal disease: Secondary | ICD-10-CM | POA: Diagnosis not present

## 2022-06-04 DIAGNOSIS — R5383 Other fatigue: Secondary | ICD-10-CM | POA: Insufficient documentation

## 2022-06-04 MED ORDER — ZINC OXIDE 20 % EX OINT: 1.0000 | TOPICAL_OINTMENT | CUTANEOUS | 0 refills | Status: DC | PRN

## 2022-06-04 MED ORDER — DOXYCYCLINE HYCLATE 100 MG PO TABS: 100.0000 mg | ORAL_TABLET | Freq: Two times a day (BID) | ORAL | 0 refills | Status: DC

## 2022-06-07 DIAGNOSIS — Z23 Encounter for immunization: Secondary | ICD-10-CM | POA: Diagnosis not present

## 2022-06-07 DIAGNOSIS — N179 Acute kidney failure, unspecified: Secondary | ICD-10-CM | POA: Diagnosis not present

## 2022-06-07 DIAGNOSIS — N183 Chronic kidney disease, stage 3 unspecified: Secondary | ICD-10-CM | POA: Diagnosis not present

## 2022-06-07 NOTE — Telephone Encounter (Signed)
Tried to contact pt regarding scheduling him for labs and possible refills for midodrine and trazodone . He was at dialysis center .  It seems that he will be following up with his specialist/transplant team and they most likely will address these matters.

## 2022-06-08 ENCOUNTER — Ambulatory Visit: Payer: BC Managed Care – PPO | Admitting: Physician Assistant

## 2022-06-09 ENCOUNTER — Telehealth: Payer: Self-pay

## 2022-06-09 DIAGNOSIS — Z23 Encounter for immunization: Secondary | ICD-10-CM | POA: Diagnosis not present

## 2022-06-09 DIAGNOSIS — D649 Anemia, unspecified: Secondary | ICD-10-CM | POA: Diagnosis not present

## 2022-06-09 DIAGNOSIS — N183 Chronic kidney disease, stage 3 unspecified: Secondary | ICD-10-CM | POA: Diagnosis not present

## 2022-06-09 DIAGNOSIS — D696 Thrombocytopenia, unspecified: Secondary | ICD-10-CM | POA: Diagnosis not present

## 2022-06-09 DIAGNOSIS — N179 Acute kidney failure, unspecified: Secondary | ICD-10-CM | POA: Diagnosis not present

## 2022-06-09 NOTE — Telephone Encounter (Unsigned)
Copied from [place] [id number]. Topic: General - Other >> Jun 09, 2022  1:23 PM Ludger Nutting wrote: Berton Lan with [facility] called to let pcp know he is stopping PT per patient request.

## 2022-06-09 NOTE — Telephone Encounter (Signed)
Please, check with pt what was her reasoning for stopping PT?

## 2022-06-10 ENCOUNTER — Encounter (INDEPENDENT_AMBULATORY_CARE_PROVIDER_SITE_OTHER): Payer: BC Managed Care – PPO | Admitting: Ophthalmology

## 2022-06-11 DIAGNOSIS — N179 Acute kidney failure, unspecified: Secondary | ICD-10-CM | POA: Diagnosis not present

## 2022-06-11 DIAGNOSIS — Z23 Encounter for immunization: Secondary | ICD-10-CM | POA: Diagnosis not present

## 2022-06-11 DIAGNOSIS — N183 Chronic kidney disease, stage 3 unspecified: Secondary | ICD-10-CM | POA: Diagnosis not present

## 2022-06-15 DIAGNOSIS — E162 Hypoglycemia, unspecified: Secondary | ICD-10-CM | POA: Diagnosis not present

## 2022-06-15 DIAGNOSIS — Z931 Gastrostomy status: Secondary | ICD-10-CM | POA: Diagnosis not present

## 2022-06-15 DIAGNOSIS — D849 Immunodeficiency, unspecified: Secondary | ICD-10-CM | POA: Diagnosis not present

## 2022-06-15 DIAGNOSIS — N179 Acute kidney failure, unspecified: Secondary | ICD-10-CM | POA: Diagnosis not present

## 2022-06-15 DIAGNOSIS — D61818 Other pancytopenia: Secondary | ICD-10-CM | POA: Diagnosis not present

## 2022-06-15 DIAGNOSIS — K831 Obstruction of bile duct: Secondary | ICD-10-CM | POA: Diagnosis not present

## 2022-06-15 DIAGNOSIS — Z743 Need for continuous supervision: Secondary | ICD-10-CM | POA: Diagnosis not present

## 2022-06-15 DIAGNOSIS — F419 Anxiety disorder, unspecified: Secondary | ICD-10-CM | POA: Diagnosis not present

## 2022-06-15 DIAGNOSIS — N3289 Other specified disorders of bladder: Secondary | ICD-10-CM | POA: Diagnosis not present

## 2022-06-15 DIAGNOSIS — J811 Chronic pulmonary edema: Secondary | ICD-10-CM | POA: Diagnosis not present

## 2022-06-15 DIAGNOSIS — Z515 Encounter for palliative care: Secondary | ICD-10-CM | POA: Diagnosis not present

## 2022-06-15 DIAGNOSIS — E440 Moderate protein-calorie malnutrition: Secondary | ICD-10-CM | POA: Diagnosis not present

## 2022-06-15 DIAGNOSIS — Z7982 Long term (current) use of aspirin: Secondary | ICD-10-CM | POA: Diagnosis not present

## 2022-06-15 DIAGNOSIS — I953 Hypotension of hemodialysis: Secondary | ICD-10-CM | POA: Diagnosis not present

## 2022-06-15 DIAGNOSIS — K8031 Calculus of bile duct with cholangitis, unspecified, with obstruction: Secondary | ICD-10-CM | POA: Diagnosis not present

## 2022-06-15 DIAGNOSIS — R4182 Altered mental status, unspecified: Secondary | ICD-10-CM | POA: Diagnosis not present

## 2022-06-15 DIAGNOSIS — R1312 Dysphagia, oropharyngeal phase: Secondary | ICD-10-CM | POA: Diagnosis not present

## 2022-06-15 DIAGNOSIS — J90 Pleural effusion, not elsewhere classified: Secondary | ICD-10-CM | POA: Diagnosis not present

## 2022-06-15 DIAGNOSIS — F331 Major depressive disorder, recurrent, moderate: Secondary | ICD-10-CM | POA: Diagnosis not present

## 2022-06-15 DIAGNOSIS — I517 Cardiomegaly: Secondary | ICD-10-CM | POA: Diagnosis not present

## 2022-06-15 DIAGNOSIS — R627 Adult failure to thrive: Secondary | ICD-10-CM | POA: Diagnosis not present

## 2022-06-15 DIAGNOSIS — F4324 Adjustment disorder with disturbance of conduct: Secondary | ICD-10-CM | POA: Diagnosis not present

## 2022-06-15 DIAGNOSIS — Z4659 Encounter for fitting and adjustment of other gastrointestinal appliance and device: Secondary | ICD-10-CM | POA: Diagnosis not present

## 2022-06-15 DIAGNOSIS — B259 Cytomegaloviral disease, unspecified: Secondary | ICD-10-CM | POA: Diagnosis not present

## 2022-06-15 DIAGNOSIS — Z79899 Other long term (current) drug therapy: Secondary | ICD-10-CM | POA: Diagnosis not present

## 2022-06-15 DIAGNOSIS — Z7401 Bed confinement status: Secondary | ICD-10-CM | POA: Diagnosis not present

## 2022-06-15 DIAGNOSIS — D84821 Immunodeficiency due to drugs: Secondary | ICD-10-CM | POA: Diagnosis not present

## 2022-06-15 DIAGNOSIS — K660 Peritoneal adhesions (postprocedural) (postinfection): Secondary | ICD-10-CM | POA: Diagnosis not present

## 2022-06-15 DIAGNOSIS — K9189 Other postprocedural complications and disorders of digestive system: Secondary | ICD-10-CM | POA: Diagnosis not present

## 2022-06-15 DIAGNOSIS — D649 Anemia, unspecified: Secondary | ICD-10-CM | POA: Diagnosis not present

## 2022-06-15 DIAGNOSIS — F54 Psychological and behavioral factors associated with disorders or diseases classified elsewhere: Secondary | ICD-10-CM | POA: Diagnosis not present

## 2022-06-15 DIAGNOSIS — T8649 Other complications of liver transplant: Secondary | ICD-10-CM | POA: Diagnosis not present

## 2022-06-15 DIAGNOSIS — A419 Sepsis, unspecified organism: Secondary | ICD-10-CM | POA: Diagnosis not present

## 2022-06-15 DIAGNOSIS — R918 Other nonspecific abnormal finding of lung field: Secondary | ICD-10-CM | POA: Diagnosis not present

## 2022-06-15 DIAGNOSIS — R7881 Bacteremia: Secondary | ICD-10-CM | POA: Diagnosis not present

## 2022-06-15 DIAGNOSIS — Z6827 Body mass index (BMI) 27.0-27.9, adult: Secondary | ICD-10-CM | POA: Diagnosis not present

## 2022-06-15 DIAGNOSIS — Z992 Dependence on renal dialysis: Secondary | ICD-10-CM | POA: Diagnosis not present

## 2022-06-15 DIAGNOSIS — A0472 Enterocolitis due to Clostridium difficile, not specified as recurrent: Secondary | ICD-10-CM | POA: Diagnosis not present

## 2022-06-15 DIAGNOSIS — R6521 Severe sepsis with septic shock: Secondary | ICD-10-CM | POA: Diagnosis not present

## 2022-06-15 DIAGNOSIS — T85590A Other mechanical complication of bile duct prosthesis, initial encounter: Secondary | ICD-10-CM | POA: Diagnosis not present

## 2022-06-15 DIAGNOSIS — Z944 Liver transplant status: Secondary | ICD-10-CM | POA: Diagnosis not present

## 2022-06-15 DIAGNOSIS — E1122 Type 2 diabetes mellitus with diabetic chronic kidney disease: Secondary | ICD-10-CM | POA: Diagnosis not present

## 2022-06-15 DIAGNOSIS — F339 Major depressive disorder, recurrent, unspecified: Secondary | ICD-10-CM | POA: Diagnosis not present

## 2022-06-15 DIAGNOSIS — B961 Klebsiella pneumoniae [K. pneumoniae] as the cause of diseases classified elsewhere: Secondary | ICD-10-CM | POA: Diagnosis not present

## 2022-06-15 DIAGNOSIS — A498 Other bacterial infections of unspecified site: Secondary | ICD-10-CM | POA: Diagnosis not present

## 2022-06-15 DIAGNOSIS — D631 Anemia in chronic kidney disease: Secondary | ICD-10-CM | POA: Diagnosis not present

## 2022-06-15 DIAGNOSIS — E8770 Fluid overload, unspecified: Secondary | ICD-10-CM | POA: Diagnosis not present

## 2022-06-15 DIAGNOSIS — J9811 Atelectasis: Secondary | ICD-10-CM | POA: Diagnosis not present

## 2022-06-15 DIAGNOSIS — R2243 Localized swelling, mass and lump, lower limb, bilateral: Secondary | ICD-10-CM | POA: Diagnosis not present

## 2022-06-15 DIAGNOSIS — K838 Other specified diseases of biliary tract: Secondary | ICD-10-CM | POA: Diagnosis not present

## 2022-06-15 DIAGNOSIS — G4733 Obstructive sleep apnea (adult) (pediatric): Secondary | ICD-10-CM | POA: Diagnosis not present

## 2022-06-15 DIAGNOSIS — N189 Chronic kidney disease, unspecified: Secondary | ICD-10-CM | POA: Diagnosis not present

## 2022-06-15 DIAGNOSIS — K8051 Calculus of bile duct without cholangitis or cholecystitis with obstruction: Secondary | ICD-10-CM | POA: Diagnosis not present

## 2022-06-15 DIAGNOSIS — Z903 Acquired absence of stomach [part of]: Secondary | ICD-10-CM | POA: Diagnosis not present

## 2022-06-15 DIAGNOSIS — R531 Weakness: Secondary | ICD-10-CM | POA: Diagnosis not present

## 2022-06-15 DIAGNOSIS — R55 Syncope and collapse: Secondary | ICD-10-CM | POA: Diagnosis not present

## 2022-06-15 DIAGNOSIS — A4159 Other Gram-negative sepsis: Secondary | ICD-10-CM | POA: Diagnosis not present

## 2022-06-15 DIAGNOSIS — E875 Hyperkalemia: Secondary | ICD-10-CM | POA: Diagnosis not present

## 2022-06-15 DIAGNOSIS — W19XXXA Unspecified fall, initial encounter: Secondary | ICD-10-CM | POA: Diagnosis not present

## 2022-06-15 DIAGNOSIS — Z9689 Presence of other specified functional implants: Secondary | ICD-10-CM | POA: Diagnosis not present

## 2022-06-15 DIAGNOSIS — E43 Unspecified severe protein-calorie malnutrition: Secondary | ICD-10-CM | POA: Diagnosis not present

## 2022-06-15 DIAGNOSIS — D696 Thrombocytopenia, unspecified: Secondary | ICD-10-CM | POA: Diagnosis not present

## 2022-06-15 DIAGNOSIS — Z452 Encounter for adjustment and management of vascular access device: Secondary | ICD-10-CM | POA: Diagnosis not present

## 2022-06-15 DIAGNOSIS — K8030 Calculus of bile duct with cholangitis, unspecified, without obstruction: Secondary | ICD-10-CM | POA: Diagnosis not present

## 2022-06-15 DIAGNOSIS — I959 Hypotension, unspecified: Secondary | ICD-10-CM | POA: Diagnosis not present

## 2022-06-15 DIAGNOSIS — R509 Fever, unspecified: Secondary | ICD-10-CM | POA: Diagnosis not present

## 2022-06-15 DIAGNOSIS — F322 Major depressive disorder, single episode, severe without psychotic features: Secondary | ICD-10-CM | POA: Diagnosis not present

## 2022-06-15 DIAGNOSIS — N186 End stage renal disease: Secondary | ICD-10-CM | POA: Diagnosis not present

## 2022-06-15 DIAGNOSIS — R5381 Other malaise: Secondary | ICD-10-CM | POA: Diagnosis not present

## 2022-06-15 DIAGNOSIS — Z9289 Personal history of other medical treatment: Secondary | ICD-10-CM | POA: Diagnosis not present

## 2022-06-16 ENCOUNTER — Ambulatory Visit: Payer: Self-pay | Admitting: *Deleted

## 2022-06-16 NOTE — Patient Outreach (Signed)
  Care Coordination   Follow Up Visit Note   06/16/2022 Name: Jesse Zimmerman MRN: [id number] DOB: 1960-12-17  Jesse Zimmerman is a 61 y.o. year old male who sees Jesse Zimmerman., MD for primary care. I spoke with  Jesse Zimmerman by phone today.  What matters to the patients health and wellness today?  Currently admitted back into the hospital, he is unsure when he will be discharged.      Goals Addressed             This Visit's Progress    RNCM: Effective Management of Health and well being   Not on track    Care Coordination Interventions: Evaluation of current treatment plan related to AMS and chronic conditions such as recovery from liver transplant of 6/4 and patient's adherence to plan as established by provider Advised patient to discuss wife's difficulty with lifting patient  from chair with PT/OT.  Encouraged to inquire about safe patient handling techniques Provided education to patient re: possible need for higher level of care post discharge.  State he will not go back to rehab, will go home. Feels his wife and private caregivers will be able to provide level of care needed Discussed plans with patient for ongoing care management follow up and provided patient with direct contact information for care management team Advised patient to discuss questions regarding ongoing weakness with provider Assessed social determinant of health barriers The patient expresses concern regarding need for increased help in the home.  Wife works from home but has limited ability to help during the day.  He is interested in hiring a private duty nurse to help with ADLs.  He has list of home agencies that provide private duty care HD sessions MWF, interested in doing home dialysis instead due to transportation concerns.   Was active with [facility] for PT/OT and nursing, stopped services stating he usually work with [facility] and would like to restart with them once he is back home            SDOH assessments and interventions completed:  No     Care Coordination Interventions:  Yes, provided   Follow up plan: Follow up call scheduled for 1/8    Encounter Outcome:  Pt. Visit Completed   Jesse David, RN, MSN, [place] Care Management Coordinator [phone]

## 2022-06-16 NOTE — Patient Instructions (Signed)
Visit Information  Thank you for taking time to visit with me today. Please don't hesitate to contact me if I can be of assistance to you before our next scheduled telephone appointment.  Following are the goals we discussed today:  Consider discharge to skilled nursing facility for rehab if recommended.   Our next appointment is by telephone on 1/8  Please call the care guide team at [phone] if you need to cancel or reschedule your appointment.   Please call the Suicide and Crisis Lifeline: 988 call the [place] National Suicide Prevention Lifeline: (641)649-5598 or TTY: 312-485-3988 TTY 830 092 1098) to talk to a trained counselor call 1-800-273-TALK (toll free, 24 hour hotline) call 911 if you are experiencing a Mental Health or [place] or need someone to talk to.  Patient verbalizes understanding of instructions and care plan provided today and agrees to view in [place]. Active MyChart status and patient understanding of how to access instructions and care plan via MyChart confirmed with patient.     The patient has been provided with contact information for the care management team and has been advised to call with any health related questions or concerns.   Valente David, RN, MSN, [place] Care Management Care Management Coordinator [phone]

## 2022-06-28 ENCOUNTER — Ambulatory Visit: Payer: Self-pay | Admitting: *Deleted

## 2022-06-28 NOTE — Patient Outreach (Signed)
  Care Coordination   Follow Up Visit Note   06/28/2022 Name: Jesse Zimmerman MRN: [id number] DOB: 12-24-1960  Jesse Zimmerman is a 62 y.o. year old male who sees Jerrol Banana., MD for primary care. I spoke with  Jesse Zimmerman by phone today.  What matters to the patients health and wellness today?  Patient remains hospitalized at [facility], he is unsure of discharge plan at this time.      SDOH assessments and interventions completed:  No     Care Coordination Interventions:  No, not indicated   Follow up plan:  Pending hospital discharge    Encounter Outcome:  Pt. Visit Completed   Valente David, RN, MSN, [place] Management Care Management Coordinator [phone]

## 2022-06-29 ENCOUNTER — Ambulatory Visit: Payer: BC Managed Care – PPO | Admitting: Physician Assistant

## 2022-07-14 ENCOUNTER — Telehealth: Payer: Self-pay | Admitting: *Deleted

## 2022-07-14 NOTE — Patient Outreach (Signed)
  Care Coordination   07/14/2022 Name: Jesse Zimmerman MRN: [id number] DOB: 09/01/1960   Care Coordination Outreach Attempts:  Call placed to patient to see if he has been discharged from [facility], no answer.    Follow Up Plan:  Additional outreach attempts will be made to offer the patient care coordination information and services.   Encounter Outcome:  No Answer   Care Coordination Interventions:  No, not indicated    Valente David, RN, MSN, [facility] Care Management Care Management Coordinator [phone]

## 2022-07-16 ENCOUNTER — Encounter: Payer: Self-pay | Admitting: *Deleted

## 2022-07-22 ENCOUNTER — Telehealth: Payer: Self-pay

## 2022-07-22 DIAGNOSIS — D631 Anemia in chronic kidney disease: Secondary | ICD-10-CM | POA: Diagnosis not present

## 2022-07-22 DIAGNOSIS — E43 Unspecified severe protein-calorie malnutrition: Secondary | ICD-10-CM | POA: Diagnosis not present

## 2022-07-22 DIAGNOSIS — R5381 Other malaise: Secondary | ICD-10-CM | POA: Diagnosis not present

## 2022-07-22 DIAGNOSIS — Z944 Liver transplant status: Secondary | ICD-10-CM | POA: Diagnosis not present

## 2022-07-22 DIAGNOSIS — A0471 Enterocolitis due to Clostridium difficile, recurrent: Secondary | ICD-10-CM | POA: Diagnosis not present

## 2022-07-22 DIAGNOSIS — E875 Hyperkalemia: Secondary | ICD-10-CM | POA: Diagnosis not present

## 2022-07-22 DIAGNOSIS — N186 End stage renal disease: Secondary | ICD-10-CM | POA: Diagnosis not present

## 2022-07-22 DIAGNOSIS — N179 Acute kidney failure, unspecified: Secondary | ICD-10-CM | POA: Diagnosis not present

## 2022-07-22 DIAGNOSIS — Z452 Encounter for adjustment and management of vascular access device: Secondary | ICD-10-CM | POA: Diagnosis not present

## 2022-07-22 DIAGNOSIS — C638 Malignant neoplasm of overlapping sites of male genital organs: Secondary | ICD-10-CM | POA: Diagnosis not present

## 2022-07-22 DIAGNOSIS — D61818 Other pancytopenia: Secondary | ICD-10-CM | POA: Diagnosis not present

## 2022-07-22 DIAGNOSIS — B259 Cytomegaloviral disease, unspecified: Secondary | ICD-10-CM | POA: Diagnosis not present

## 2022-07-22 DIAGNOSIS — Z79899 Other long term (current) drug therapy: Secondary | ICD-10-CM | POA: Diagnosis not present

## 2022-07-22 DIAGNOSIS — D849 Immunodeficiency, unspecified: Secondary | ICD-10-CM | POA: Diagnosis not present

## 2022-07-22 DIAGNOSIS — Z992 Dependence on renal dialysis: Secondary | ICD-10-CM | POA: Diagnosis not present

## 2022-07-23 DIAGNOSIS — N179 Acute kidney failure, unspecified: Secondary | ICD-10-CM | POA: Diagnosis not present

## 2022-07-23 DIAGNOSIS — E875 Hyperkalemia: Secondary | ICD-10-CM | POA: Diagnosis not present

## 2022-07-23 DIAGNOSIS — N186 End stage renal disease: Secondary | ICD-10-CM | POA: Diagnosis not present

## 2022-07-23 DIAGNOSIS — Z992 Dependence on renal dialysis: Secondary | ICD-10-CM | POA: Diagnosis not present

## 2022-07-23 DIAGNOSIS — E43 Unspecified severe protein-calorie malnutrition: Secondary | ICD-10-CM | POA: Diagnosis not present

## 2022-07-23 DIAGNOSIS — Z944 Liver transplant status: Secondary | ICD-10-CM | POA: Diagnosis not present

## 2022-07-23 DIAGNOSIS — Z452 Encounter for adjustment and management of vascular access device: Secondary | ICD-10-CM | POA: Diagnosis not present

## 2022-07-23 DIAGNOSIS — R5381 Other malaise: Secondary | ICD-10-CM | POA: Diagnosis not present

## 2022-07-23 DIAGNOSIS — D61818 Other pancytopenia: Secondary | ICD-10-CM | POA: Diagnosis not present

## 2022-07-23 DIAGNOSIS — D849 Immunodeficiency, unspecified: Secondary | ICD-10-CM | POA: Diagnosis not present

## 2022-07-23 DIAGNOSIS — Z79899 Other long term (current) drug therapy: Secondary | ICD-10-CM | POA: Diagnosis not present

## 2022-07-23 DIAGNOSIS — D631 Anemia in chronic kidney disease: Secondary | ICD-10-CM | POA: Diagnosis not present

## 2022-07-23 DIAGNOSIS — A0471 Enterocolitis due to Clostridium difficile, recurrent: Secondary | ICD-10-CM | POA: Diagnosis not present

## 2022-07-23 DIAGNOSIS — G4733 Obstructive sleep apnea (adult) (pediatric): Secondary | ICD-10-CM | POA: Diagnosis not present

## 2022-07-23 DIAGNOSIS — B259 Cytomegaloviral disease, unspecified: Secondary | ICD-10-CM | POA: Diagnosis not present

## 2022-07-23 DIAGNOSIS — C638 Malignant neoplasm of overlapping sites of male genital organs: Secondary | ICD-10-CM | POA: Diagnosis not present

## 2022-07-23 NOTE — Telephone Encounter (Signed)
I called and spoke to the patient's wife and Home Health Nurse. The wife informed me that the patient's feeding tube is being managed by a [employer] representative. She also informed me that Dr. Rosanna Randy at [facility] will be managing his Primary Care. I also spoke with Cecilie Lowers and informed him that he needed to route his message to the [facility] gastroenterologist. He verbalizes understanding.

## 2022-07-26 DIAGNOSIS — Z992 Dependence on renal dialysis: Secondary | ICD-10-CM | POA: Diagnosis not present

## 2022-07-26 DIAGNOSIS — Z452 Encounter for adjustment and management of vascular access device: Secondary | ICD-10-CM | POA: Diagnosis not present

## 2022-07-26 DIAGNOSIS — E43 Unspecified severe protein-calorie malnutrition: Secondary | ICD-10-CM | POA: Diagnosis not present

## 2022-07-26 DIAGNOSIS — A0471 Enterocolitis due to Clostridium difficile, recurrent: Secondary | ICD-10-CM | POA: Diagnosis not present

## 2022-07-26 DIAGNOSIS — D631 Anemia in chronic kidney disease: Secondary | ICD-10-CM | POA: Diagnosis not present

## 2022-07-26 DIAGNOSIS — N186 End stage renal disease: Secondary | ICD-10-CM | POA: Diagnosis not present

## 2022-07-26 DIAGNOSIS — D849 Immunodeficiency, unspecified: Secondary | ICD-10-CM | POA: Diagnosis not present

## 2022-07-26 DIAGNOSIS — E875 Hyperkalemia: Secondary | ICD-10-CM | POA: Diagnosis not present

## 2022-07-26 DIAGNOSIS — N179 Acute kidney failure, unspecified: Secondary | ICD-10-CM | POA: Diagnosis not present

## 2022-07-26 DIAGNOSIS — B259 Cytomegaloviral disease, unspecified: Secondary | ICD-10-CM | POA: Diagnosis not present

## 2022-07-26 DIAGNOSIS — D61818 Other pancytopenia: Secondary | ICD-10-CM | POA: Diagnosis not present

## 2022-07-26 DIAGNOSIS — C638 Malignant neoplasm of overlapping sites of male genital organs: Secondary | ICD-10-CM | POA: Diagnosis not present

## 2022-07-26 DIAGNOSIS — Z79899 Other long term (current) drug therapy: Secondary | ICD-10-CM | POA: Diagnosis not present

## 2022-07-27 ENCOUNTER — Telehealth: Payer: Self-pay | Admitting: *Deleted

## 2022-07-27 ENCOUNTER — Other Ambulatory Visit: Payer: Self-pay | Admitting: Physician Assistant

## 2022-07-27 DIAGNOSIS — D849 Immunodeficiency, unspecified: Secondary | ICD-10-CM | POA: Diagnosis not present

## 2022-07-27 DIAGNOSIS — B259 Cytomegaloviral disease, unspecified: Secondary | ICD-10-CM | POA: Diagnosis not present

## 2022-07-27 DIAGNOSIS — Z944 Liver transplant status: Secondary | ICD-10-CM | POA: Diagnosis not present

## 2022-07-27 DIAGNOSIS — D61818 Other pancytopenia: Secondary | ICD-10-CM | POA: Diagnosis not present

## 2022-07-27 DIAGNOSIS — A0471 Enterocolitis due to Clostridium difficile, recurrent: Secondary | ICD-10-CM | POA: Diagnosis not present

## 2022-07-27 DIAGNOSIS — E43 Unspecified severe protein-calorie malnutrition: Secondary | ICD-10-CM | POA: Diagnosis not present

## 2022-07-27 DIAGNOSIS — N186 End stage renal disease: Secondary | ICD-10-CM | POA: Diagnosis not present

## 2022-07-27 DIAGNOSIS — Z79899 Other long term (current) drug therapy: Secondary | ICD-10-CM | POA: Diagnosis not present

## 2022-07-27 DIAGNOSIS — Z452 Encounter for adjustment and management of vascular access device: Secondary | ICD-10-CM | POA: Diagnosis not present

## 2022-07-27 DIAGNOSIS — D631 Anemia in chronic kidney disease: Secondary | ICD-10-CM | POA: Diagnosis not present

## 2022-07-27 DIAGNOSIS — Z992 Dependence on renal dialysis: Secondary | ICD-10-CM | POA: Diagnosis not present

## 2022-07-27 DIAGNOSIS — N179 Acute kidney failure, unspecified: Secondary | ICD-10-CM | POA: Diagnosis not present

## 2022-07-27 DIAGNOSIS — E875 Hyperkalemia: Secondary | ICD-10-CM | POA: Diagnosis not present

## 2022-07-27 DIAGNOSIS — C638 Malignant neoplasm of overlapping sites of male genital organs: Secondary | ICD-10-CM | POA: Diagnosis not present

## 2022-07-27 NOTE — Patient Outreach (Signed)
  Care Coordination   Follow Up Visit Note   07/27/2022 Name: Jesse Zimmerman MRN: [id number] DOB: 06/23/1960  Jesse Zimmerman is a 62 y.o. year old male who sees Eulas Post, MD for primary care. I spoke with  Jesse Zimmerman and wife Jesse Zimmerman by phone today.  What matters to the patients health and wellness today?  Recently discharged from hospital on 1/31, remains weak and unable to walk.     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   Provided education to patient re: resources providing primary care through home visits, declines stating they would like to keep Dr. Rosanna Randy as PCP Discussed plans with patient for ongoing care management follow up and provided patient with direct contact information for care management team Advised patient to discuss questions regarding ongoing weakness with provider The patient expresses concern regarding need for increased help in the home.  Wife works from home but has limited ability to help during the day.  He is interested in hiring a private duty nurse to help with ADLs.  He has list of home agencies that provide private duty care HD sessions MWF, per wife, friends are helping to get patient out of the home and on the [place] transportation system.   Patient now has PICC line and PEG for feeding, wife report she is aware of how to care for both.  Upcoming appointments with hematology on 2/7, and 2/9 at [facility].  Patient report these are virtual visits.            SDOH assessments and interventions completed:  No     Care Coordination Interventions:  Yes,  provided   Follow up plan: Follow up call scheduled for 2/13    Encounter Outcome:  Pt. Visit Completed   Valente David, RN, MSN, [place] Care Management Care Management Coordinator [phone]

## 2022-07-28 ENCOUNTER — Telehealth: Payer: Self-pay

## 2022-07-28 DIAGNOSIS — J811 Chronic pulmonary edema: Secondary | ICD-10-CM | POA: Diagnosis not present

## 2022-07-28 DIAGNOSIS — Z903 Acquired absence of stomach [part of]: Secondary | ICD-10-CM | POA: Diagnosis not present

## 2022-07-28 DIAGNOSIS — D8481 Immunodeficiency due to conditions classified elsewhere: Secondary | ICD-10-CM | POA: Diagnosis not present

## 2022-07-28 DIAGNOSIS — R509 Fever, unspecified: Secondary | ICD-10-CM | POA: Diagnosis not present

## 2022-07-28 DIAGNOSIS — R5381 Other malaise: Secondary | ICD-10-CM | POA: Diagnosis not present

## 2022-07-28 DIAGNOSIS — R7401 Elevation of levels of liver transaminase levels: Secondary | ICD-10-CM | POA: Diagnosis not present

## 2022-07-28 DIAGNOSIS — I959 Hypotension, unspecified: Secondary | ICD-10-CM | POA: Diagnosis not present

## 2022-07-28 DIAGNOSIS — Z944 Liver transplant status: Secondary | ICD-10-CM | POA: Diagnosis not present

## 2022-07-28 DIAGNOSIS — R188 Other ascites: Secondary | ICD-10-CM | POA: Diagnosis not present

## 2022-07-28 DIAGNOSIS — E8770 Fluid overload, unspecified: Secondary | ICD-10-CM | POA: Diagnosis not present

## 2022-07-28 DIAGNOSIS — I517 Cardiomegaly: Secondary | ICD-10-CM | POA: Diagnosis not present

## 2022-07-28 DIAGNOSIS — R2243 Localized swelling, mass and lump, lower limb, bilateral: Secondary | ICD-10-CM | POA: Diagnosis not present

## 2022-07-28 DIAGNOSIS — R4182 Altered mental status, unspecified: Secondary | ICD-10-CM | POA: Diagnosis not present

## 2022-07-28 DIAGNOSIS — J90 Pleural effusion, not elsewhere classified: Secondary | ICD-10-CM | POA: Diagnosis not present

## 2022-07-28 DIAGNOSIS — A0472 Enterocolitis due to Clostridium difficile, not specified as recurrent: Secondary | ICD-10-CM | POA: Diagnosis not present

## 2022-07-28 DIAGNOSIS — K668 Other specified disorders of peritoneum: Secondary | ICD-10-CM | POA: Diagnosis not present

## 2022-07-28 NOTE — Telephone Encounter (Signed)
(  4:50PM) TC to patients spouse, Velva Harman, to schedule initial PC visit.   Spouse stated that EMS was currently in route to their home and requested to call SW back. SW will monitor patients chart for hospital visit and outreach spouse at later date/time.

## 2022-07-29 DIAGNOSIS — R918 Other nonspecific abnormal finding of lung field: Secondary | ICD-10-CM | POA: Diagnosis not present

## 2022-07-29 DIAGNOSIS — R4182 Altered mental status, unspecified: Secondary | ICD-10-CM | POA: Diagnosis not present

## 2022-07-29 DIAGNOSIS — D849 Immunodeficiency, unspecified: Secondary | ICD-10-CM | POA: Diagnosis not present

## 2022-07-29 DIAGNOSIS — Z515 Encounter for palliative care: Secondary | ICD-10-CM | POA: Diagnosis not present

## 2022-07-29 DIAGNOSIS — N186 End stage renal disease: Secondary | ICD-10-CM | POA: Diagnosis not present

## 2022-07-29 DIAGNOSIS — J9602 Acute respiratory failure with hypercapnia: Secondary | ICD-10-CM | POA: Diagnosis not present

## 2022-07-29 DIAGNOSIS — R0902 Hypoxemia: Secondary | ICD-10-CM | POA: Diagnosis not present

## 2022-07-29 DIAGNOSIS — Z944 Liver transplant status: Secondary | ICD-10-CM | POA: Diagnosis not present

## 2022-07-29 DIAGNOSIS — R400 Somnolence: Secondary | ICD-10-CM | POA: Diagnosis not present

## 2022-07-29 DIAGNOSIS — J90 Pleural effusion, not elsewhere classified: Secondary | ICD-10-CM | POA: Diagnosis not present

## 2022-07-29 DIAGNOSIS — K7689 Other specified diseases of liver: Secondary | ICD-10-CM | POA: Diagnosis not present

## 2022-07-29 DIAGNOSIS — K668 Other specified disorders of peritoneum: Secondary | ICD-10-CM | POA: Diagnosis not present

## 2022-07-29 DIAGNOSIS — Z452 Encounter for adjustment and management of vascular access device: Secondary | ICD-10-CM | POA: Diagnosis not present

## 2022-07-29 DIAGNOSIS — R188 Other ascites: Secondary | ICD-10-CM | POA: Diagnosis not present

## 2022-07-29 DIAGNOSIS — Z992 Dependence on renal dialysis: Secondary | ICD-10-CM | POA: Diagnosis not present

## 2022-07-29 DIAGNOSIS — R7989 Other specified abnormal findings of blood chemistry: Secondary | ICD-10-CM | POA: Diagnosis not present

## 2022-07-29 DIAGNOSIS — R509 Fever, unspecified: Secondary | ICD-10-CM | POA: Diagnosis not present

## 2022-07-29 DIAGNOSIS — R5381 Other malaise: Secondary | ICD-10-CM | POA: Diagnosis not present

## 2022-07-29 DIAGNOSIS — L89150 Pressure ulcer of sacral region, unstageable: Secondary | ICD-10-CM | POA: Diagnosis not present

## 2022-07-30 DIAGNOSIS — Z452 Encounter for adjustment and management of vascular access device: Secondary | ICD-10-CM | POA: Diagnosis not present

## 2022-07-30 DIAGNOSIS — Z944 Liver transplant status: Secondary | ICD-10-CM | POA: Diagnosis not present

## 2022-07-30 DIAGNOSIS — N186 End stage renal disease: Secondary | ICD-10-CM | POA: Diagnosis not present

## 2022-07-30 DIAGNOSIS — R579 Shock, unspecified: Secondary | ICD-10-CM | POA: Diagnosis not present

## 2022-07-30 DIAGNOSIS — K7689 Other specified diseases of liver: Secondary | ICD-10-CM | POA: Diagnosis not present

## 2022-07-30 DIAGNOSIS — K9413 Enterostomy malfunction: Secondary | ICD-10-CM | POA: Diagnosis not present

## 2022-07-30 DIAGNOSIS — I158 Other secondary hypertension: Secondary | ICD-10-CM | POA: Diagnosis not present

## 2022-07-30 DIAGNOSIS — R400 Somnolence: Secondary | ICD-10-CM | POA: Diagnosis not present

## 2022-07-30 DIAGNOSIS — J9601 Acute respiratory failure with hypoxia: Secondary | ICD-10-CM | POA: Diagnosis not present

## 2022-07-30 DIAGNOSIS — D849 Immunodeficiency, unspecified: Secondary | ICD-10-CM | POA: Diagnosis not present

## 2022-07-30 DIAGNOSIS — F4325 Adjustment disorder with mixed disturbance of emotions and conduct: Secondary | ICD-10-CM | POA: Diagnosis not present

## 2022-07-30 DIAGNOSIS — J90 Pleural effusion, not elsewhere classified: Secondary | ICD-10-CM | POA: Diagnosis not present

## 2022-07-30 DIAGNOSIS — R7989 Other specified abnormal findings of blood chemistry: Secondary | ICD-10-CM | POA: Diagnosis not present

## 2022-07-30 DIAGNOSIS — R509 Fever, unspecified: Secondary | ICD-10-CM | POA: Diagnosis not present

## 2022-07-31 DIAGNOSIS — D849 Immunodeficiency, unspecified: Secondary | ICD-10-CM | POA: Diagnosis not present

## 2022-07-31 DIAGNOSIS — N186 End stage renal disease: Secondary | ICD-10-CM | POA: Diagnosis not present

## 2022-07-31 DIAGNOSIS — J90 Pleural effusion, not elsewhere classified: Secondary | ICD-10-CM | POA: Diagnosis not present

## 2022-07-31 DIAGNOSIS — K7689 Other specified diseases of liver: Secondary | ICD-10-CM | POA: Diagnosis not present

## 2022-07-31 DIAGNOSIS — R509 Fever, unspecified: Secondary | ICD-10-CM | POA: Diagnosis not present

## 2022-07-31 DIAGNOSIS — Z944 Liver transplant status: Secondary | ICD-10-CM | POA: Diagnosis not present

## 2022-07-31 DIAGNOSIS — I158 Other secondary hypertension: Secondary | ICD-10-CM | POA: Diagnosis not present

## 2022-07-31 DIAGNOSIS — I517 Cardiomegaly: Secondary | ICD-10-CM | POA: Diagnosis not present

## 2022-07-31 DIAGNOSIS — R400 Somnolence: Secondary | ICD-10-CM | POA: Diagnosis not present

## 2022-07-31 DIAGNOSIS — R918 Other nonspecific abnormal finding of lung field: Secondary | ICD-10-CM | POA: Diagnosis not present

## 2022-07-31 DIAGNOSIS — J9601 Acute respiratory failure with hypoxia: Secondary | ICD-10-CM | POA: Diagnosis not present

## 2022-08-01 DIAGNOSIS — R400 Somnolence: Secondary | ICD-10-CM | POA: Diagnosis not present

## 2022-08-01 DIAGNOSIS — R1110 Vomiting, unspecified: Secondary | ICD-10-CM | POA: Diagnosis not present

## 2022-08-01 DIAGNOSIS — K7689 Other specified diseases of liver: Secondary | ICD-10-CM | POA: Diagnosis not present

## 2022-08-01 DIAGNOSIS — D849 Immunodeficiency, unspecified: Secondary | ICD-10-CM | POA: Diagnosis not present

## 2022-08-01 DIAGNOSIS — I158 Other secondary hypertension: Secondary | ICD-10-CM | POA: Diagnosis not present

## 2022-08-01 DIAGNOSIS — R509 Fever, unspecified: Secondary | ICD-10-CM | POA: Diagnosis not present

## 2022-08-01 DIAGNOSIS — J9601 Acute respiratory failure with hypoxia: Secondary | ICD-10-CM | POA: Diagnosis not present

## 2022-08-01 DIAGNOSIS — N186 End stage renal disease: Secondary | ICD-10-CM | POA: Diagnosis not present

## 2022-08-01 DIAGNOSIS — R112 Nausea with vomiting, unspecified: Secondary | ICD-10-CM | POA: Diagnosis not present

## 2022-08-01 DIAGNOSIS — Z944 Liver transplant status: Secondary | ICD-10-CM | POA: Diagnosis not present

## 2022-08-02 DIAGNOSIS — Z944 Liver transplant status: Secondary | ICD-10-CM | POA: Diagnosis not present

## 2022-08-02 DIAGNOSIS — R509 Fever, unspecified: Secondary | ICD-10-CM | POA: Diagnosis not present

## 2022-08-02 DIAGNOSIS — N186 End stage renal disease: Secondary | ICD-10-CM | POA: Diagnosis not present

## 2022-08-02 DIAGNOSIS — Z792 Long term (current) use of antibiotics: Secondary | ICD-10-CM | POA: Diagnosis not present

## 2022-08-02 DIAGNOSIS — Z9884 Bariatric surgery status: Secondary | ICD-10-CM | POA: Diagnosis not present

## 2022-08-02 DIAGNOSIS — D849 Immunodeficiency, unspecified: Secondary | ICD-10-CM | POA: Diagnosis not present

## 2022-08-02 DIAGNOSIS — R6521 Severe sepsis with septic shock: Secondary | ICD-10-CM | POA: Diagnosis not present

## 2022-08-02 DIAGNOSIS — J9601 Acute respiratory failure with hypoxia: Secondary | ICD-10-CM | POA: Diagnosis not present

## 2022-08-02 DIAGNOSIS — A498 Other bacterial infections of unspecified site: Secondary | ICD-10-CM | POA: Diagnosis not present

## 2022-08-02 DIAGNOSIS — A4901 Methicillin susceptible Staphylococcus aureus infection, unspecified site: Secondary | ICD-10-CM | POA: Diagnosis not present

## 2022-08-02 DIAGNOSIS — A419 Sepsis, unspecified organism: Secondary | ICD-10-CM | POA: Diagnosis not present

## 2022-08-02 DIAGNOSIS — J9602 Acute respiratory failure with hypercapnia: Secondary | ICD-10-CM | POA: Diagnosis not present

## 2022-08-03 ENCOUNTER — Ambulatory Visit: Payer: Self-pay | Admitting: *Deleted

## 2022-08-03 DIAGNOSIS — J9602 Acute respiratory failure with hypercapnia: Secondary | ICD-10-CM | POA: Diagnosis not present

## 2022-08-03 DIAGNOSIS — A4901 Methicillin susceptible Staphylococcus aureus infection, unspecified site: Secondary | ICD-10-CM | POA: Diagnosis not present

## 2022-08-03 DIAGNOSIS — R509 Fever, unspecified: Secondary | ICD-10-CM | POA: Diagnosis not present

## 2022-08-03 DIAGNOSIS — R918 Other nonspecific abnormal finding of lung field: Secondary | ICD-10-CM | POA: Diagnosis not present

## 2022-08-03 DIAGNOSIS — R5381 Other malaise: Secondary | ICD-10-CM | POA: Diagnosis not present

## 2022-08-03 DIAGNOSIS — R188 Other ascites: Secondary | ICD-10-CM | POA: Diagnosis not present

## 2022-08-03 DIAGNOSIS — N201 Calculus of ureter: Secondary | ICD-10-CM | POA: Diagnosis not present

## 2022-08-03 DIAGNOSIS — N186 End stage renal disease: Secondary | ICD-10-CM | POA: Diagnosis not present

## 2022-08-03 DIAGNOSIS — Z792 Long term (current) use of antibiotics: Secondary | ICD-10-CM | POA: Diagnosis not present

## 2022-08-03 DIAGNOSIS — Z9884 Bariatric surgery status: Secondary | ICD-10-CM | POA: Diagnosis not present

## 2022-08-03 DIAGNOSIS — K668 Other specified disorders of peritoneum: Secondary | ICD-10-CM | POA: Diagnosis not present

## 2022-08-03 DIAGNOSIS — J90 Pleural effusion, not elsewhere classified: Secondary | ICD-10-CM | POA: Diagnosis not present

## 2022-08-03 DIAGNOSIS — R6521 Severe sepsis with septic shock: Secondary | ICD-10-CM | POA: Diagnosis not present

## 2022-08-03 DIAGNOSIS — Z515 Encounter for palliative care: Secondary | ICD-10-CM | POA: Diagnosis not present

## 2022-08-03 DIAGNOSIS — A419 Sepsis, unspecified organism: Secondary | ICD-10-CM | POA: Diagnosis not present

## 2022-08-03 DIAGNOSIS — A498 Other bacterial infections of unspecified site: Secondary | ICD-10-CM | POA: Diagnosis not present

## 2022-08-03 DIAGNOSIS — Z944 Liver transplant status: Secondary | ICD-10-CM | POA: Diagnosis not present

## 2022-08-03 DIAGNOSIS — J9601 Acute respiratory failure with hypoxia: Secondary | ICD-10-CM | POA: Diagnosis not present

## 2022-08-03 DIAGNOSIS — D849 Immunodeficiency, unspecified: Secondary | ICD-10-CM | POA: Diagnosis not present

## 2022-08-03 DIAGNOSIS — R112 Nausea with vomiting, unspecified: Secondary | ICD-10-CM | POA: Diagnosis not present

## 2022-08-03 NOTE — Patient Outreach (Signed)
  Care Coordination   Follow Up Visit Note   08/03/2022 Name: Jesse Zimmerman MRN: [id number] DOB: [birth year]  Jesse Zimmerman is a 62 y.o. year old male who sees Jesse Post, MD for primary care. I spoke with  Jesse Zimmerman, wife of Jesse Zimmerman by phone today.  What matters to the patients health and wellness today?  Per wife, patient readmitted back to [facility] for on 2/7 for fever and oxygen saturations of 58%.  He is currently in the ICU.    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   Provided education to patient re: resources providing primary care through home visits, declines stating they would like to keep Dr. Rosanna Randy as PCP Discussed plans with patient for ongoing care management follow up and provided patient with direct contact information for care management team Advised patient to discuss questions regarding ongoing weakness with provider The patient expresses concern regarding need for increased help in the home.  Wife works from home but has limited ability to help during the day.  He is interested in hiring a private duty nurse to help with ADLs.  He has list of home agencies that provide private duty care HD sessions MWF, per wife, friends are helping to get patient out of the home and on the [place] transportation system.   Patient now has PICC line and PEG for feeding, wife report she is aware of how to care for both.             SDOH assessments and interventions completed:  No     Care Coordination Interventions:  Yes, provided   Follow up plan: Follow up call scheduled for within  the next 2 weeks or pending hospital discharge    Encounter Outcome:  Pt. Visit Completed   Jesse David, RN, MSN, [place] Management Care Management Coordinator [phone]

## 2022-08-04 DIAGNOSIS — R918 Other nonspecific abnormal finding of lung field: Secondary | ICD-10-CM | POA: Diagnosis not present

## 2022-08-04 DIAGNOSIS — Z944 Liver transplant status: Secondary | ICD-10-CM | POA: Diagnosis not present

## 2022-08-04 DIAGNOSIS — A4901 Methicillin susceptible Staphylococcus aureus infection, unspecified site: Secondary | ICD-10-CM | POA: Diagnosis not present

## 2022-08-04 DIAGNOSIS — R509 Fever, unspecified: Secondary | ICD-10-CM | POA: Diagnosis not present

## 2022-08-04 DIAGNOSIS — G4733 Obstructive sleep apnea (adult) (pediatric): Secondary | ICD-10-CM | POA: Diagnosis not present

## 2022-08-04 DIAGNOSIS — J939 Pneumothorax, unspecified: Secondary | ICD-10-CM | POA: Diagnosis not present

## 2022-08-04 DIAGNOSIS — Z792 Long term (current) use of antibiotics: Secondary | ICD-10-CM | POA: Diagnosis not present

## 2022-08-04 DIAGNOSIS — J9602 Acute respiratory failure with hypercapnia: Secondary | ICD-10-CM | POA: Diagnosis not present

## 2022-08-04 DIAGNOSIS — N186 End stage renal disease: Secondary | ICD-10-CM | POA: Diagnosis not present

## 2022-08-04 DIAGNOSIS — I953 Hypotension of hemodialysis: Secondary | ICD-10-CM | POA: Diagnosis not present

## 2022-08-04 DIAGNOSIS — J9601 Acute respiratory failure with hypoxia: Secondary | ICD-10-CM | POA: Diagnosis not present

## 2022-08-04 DIAGNOSIS — J90 Pleural effusion, not elsewhere classified: Secondary | ICD-10-CM | POA: Diagnosis not present

## 2022-08-04 DIAGNOSIS — D849 Immunodeficiency, unspecified: Secondary | ICD-10-CM | POA: Diagnosis not present

## 2022-08-04 DIAGNOSIS — Z452 Encounter for adjustment and management of vascular access device: Secondary | ICD-10-CM | POA: Diagnosis not present

## 2022-08-04 DIAGNOSIS — Z9884 Bariatric surgery status: Secondary | ICD-10-CM | POA: Diagnosis not present

## 2022-08-04 DIAGNOSIS — A498 Other bacterial infections of unspecified site: Secondary | ICD-10-CM | POA: Diagnosis not present

## 2022-08-05 ENCOUNTER — Telehealth: Payer: Self-pay | Admitting: Family Medicine

## 2022-08-05 DIAGNOSIS — I953 Hypotension of hemodialysis: Secondary | ICD-10-CM | POA: Diagnosis not present

## 2022-08-05 DIAGNOSIS — A498 Other bacterial infections of unspecified site: Secondary | ICD-10-CM | POA: Diagnosis not present

## 2022-08-05 DIAGNOSIS — Z944 Liver transplant status: Secondary | ICD-10-CM | POA: Diagnosis not present

## 2022-08-05 DIAGNOSIS — R509 Fever, unspecified: Secondary | ICD-10-CM | POA: Diagnosis not present

## 2022-08-05 DIAGNOSIS — Z792 Long term (current) use of antibiotics: Secondary | ICD-10-CM | POA: Diagnosis not present

## 2022-08-05 DIAGNOSIS — F4325 Adjustment disorder with mixed disturbance of emotions and conduct: Secondary | ICD-10-CM | POA: Diagnosis not present

## 2022-08-05 DIAGNOSIS — R931 Abnormal findings on diagnostic imaging of heart and coronary circulation: Secondary | ICD-10-CM | POA: Diagnosis not present

## 2022-08-05 DIAGNOSIS — J9602 Acute respiratory failure with hypercapnia: Secondary | ICD-10-CM | POA: Diagnosis not present

## 2022-08-05 DIAGNOSIS — N186 End stage renal disease: Secondary | ICD-10-CM | POA: Diagnosis not present

## 2022-08-05 DIAGNOSIS — J948 Other specified pleural conditions: Secondary | ICD-10-CM | POA: Diagnosis not present

## 2022-08-05 DIAGNOSIS — J90 Pleural effusion, not elsewhere classified: Secondary | ICD-10-CM | POA: Diagnosis not present

## 2022-08-05 DIAGNOSIS — A4901 Methicillin susceptible Staphylococcus aureus infection, unspecified site: Secondary | ICD-10-CM | POA: Diagnosis not present

## 2022-08-05 DIAGNOSIS — Z9884 Bariatric surgery status: Secondary | ICD-10-CM | POA: Diagnosis not present

## 2022-08-05 DIAGNOSIS — Z4682 Encounter for fitting and adjustment of non-vascular catheter: Secondary | ICD-10-CM | POA: Diagnosis not present

## 2022-08-05 DIAGNOSIS — D849 Immunodeficiency, unspecified: Secondary | ICD-10-CM | POA: Diagnosis not present

## 2022-08-05 DIAGNOSIS — J9601 Acute respiratory failure with hypoxia: Secondary | ICD-10-CM | POA: Diagnosis not present

## 2022-08-05 NOTE — Telephone Encounter (Signed)
Dr. Elsworth Soho is calling in to request information on patient. Wants to know if patient has had a serum creatinine check since his last one in 06/29/ 23. Creatinine was 1.8. She wants to know is it still elevated and if it's not what is the current lab value.  Dr. Elsworth Soho would also like information on his care team. Please follow up with Dr. Elsworth Soho. She says you can leave a message on her phone, it is a secure voicemail that only comes to her. [phone]

## 2022-08-06 DIAGNOSIS — I953 Hypotension of hemodialysis: Secondary | ICD-10-CM | POA: Diagnosis not present

## 2022-08-06 DIAGNOSIS — D849 Immunodeficiency, unspecified: Secondary | ICD-10-CM | POA: Diagnosis not present

## 2022-08-06 DIAGNOSIS — Z9884 Bariatric surgery status: Secondary | ICD-10-CM | POA: Diagnosis not present

## 2022-08-06 DIAGNOSIS — I517 Cardiomegaly: Secondary | ICD-10-CM | POA: Diagnosis not present

## 2022-08-06 DIAGNOSIS — A4901 Methicillin susceptible Staphylococcus aureus infection, unspecified site: Secondary | ICD-10-CM | POA: Diagnosis not present

## 2022-08-06 DIAGNOSIS — Z944 Liver transplant status: Secondary | ICD-10-CM | POA: Diagnosis not present

## 2022-08-06 DIAGNOSIS — J9602 Acute respiratory failure with hypercapnia: Secondary | ICD-10-CM | POA: Diagnosis not present

## 2022-08-06 DIAGNOSIS — J90 Pleural effusion, not elsewhere classified: Secondary | ICD-10-CM | POA: Diagnosis not present

## 2022-08-06 DIAGNOSIS — Z792 Long term (current) use of antibiotics: Secondary | ICD-10-CM | POA: Diagnosis not present

## 2022-08-06 DIAGNOSIS — R918 Other nonspecific abnormal finding of lung field: Secondary | ICD-10-CM | POA: Diagnosis not present

## 2022-08-06 DIAGNOSIS — J9601 Acute respiratory failure with hypoxia: Secondary | ICD-10-CM | POA: Diagnosis not present

## 2022-08-06 DIAGNOSIS — R509 Fever, unspecified: Secondary | ICD-10-CM | POA: Diagnosis not present

## 2022-08-06 DIAGNOSIS — Z992 Dependence on renal dialysis: Secondary | ICD-10-CM | POA: Diagnosis not present

## 2022-08-06 DIAGNOSIS — A498 Other bacterial infections of unspecified site: Secondary | ICD-10-CM | POA: Diagnosis not present

## 2022-08-06 DIAGNOSIS — N186 End stage renal disease: Secondary | ICD-10-CM | POA: Diagnosis not present

## 2022-08-07 DIAGNOSIS — Z992 Dependence on renal dialysis: Secondary | ICD-10-CM | POA: Diagnosis not present

## 2022-08-07 DIAGNOSIS — N186 End stage renal disease: Secondary | ICD-10-CM | POA: Diagnosis not present

## 2022-08-07 DIAGNOSIS — J90 Pleural effusion, not elsewhere classified: Secondary | ICD-10-CM | POA: Diagnosis not present

## 2022-08-07 DIAGNOSIS — Z9884 Bariatric surgery status: Secondary | ICD-10-CM | POA: Diagnosis not present

## 2022-08-07 DIAGNOSIS — Z944 Liver transplant status: Secondary | ICD-10-CM | POA: Diagnosis not present

## 2022-08-07 DIAGNOSIS — J9602 Acute respiratory failure with hypercapnia: Secondary | ICD-10-CM | POA: Diagnosis not present

## 2022-08-07 DIAGNOSIS — I953 Hypotension of hemodialysis: Secondary | ICD-10-CM | POA: Diagnosis not present

## 2022-08-07 DIAGNOSIS — J9601 Acute respiratory failure with hypoxia: Secondary | ICD-10-CM | POA: Diagnosis not present

## 2022-08-07 DIAGNOSIS — D849 Immunodeficiency, unspecified: Secondary | ICD-10-CM | POA: Diagnosis not present

## 2022-08-08 DIAGNOSIS — Z9884 Bariatric surgery status: Secondary | ICD-10-CM | POA: Diagnosis not present

## 2022-08-08 DIAGNOSIS — R1312 Dysphagia, oropharyngeal phase: Secondary | ICD-10-CM | POA: Diagnosis not present

## 2022-08-08 DIAGNOSIS — D849 Immunodeficiency, unspecified: Secondary | ICD-10-CM | POA: Diagnosis not present

## 2022-08-08 DIAGNOSIS — Z944 Liver transplant status: Secondary | ICD-10-CM | POA: Diagnosis not present

## 2022-08-08 DIAGNOSIS — N186 End stage renal disease: Secondary | ICD-10-CM | POA: Diagnosis not present

## 2022-08-09 DIAGNOSIS — I517 Cardiomegaly: Secondary | ICD-10-CM | POA: Diagnosis not present

## 2022-08-09 DIAGNOSIS — J9383 Other pneumothorax: Secondary | ICD-10-CM | POA: Diagnosis not present

## 2022-08-09 DIAGNOSIS — J948 Other specified pleural conditions: Secondary | ICD-10-CM | POA: Diagnosis not present

## 2022-08-09 DIAGNOSIS — R7881 Bacteremia: Secondary | ICD-10-CM | POA: Diagnosis not present

## 2022-08-09 DIAGNOSIS — Z944 Liver transplant status: Secondary | ICD-10-CM | POA: Diagnosis not present

## 2022-08-09 DIAGNOSIS — F330 Major depressive disorder, recurrent, mild: Secondary | ICD-10-CM | POA: Diagnosis not present

## 2022-08-09 DIAGNOSIS — J90 Pleural effusion, not elsewhere classified: Secondary | ICD-10-CM | POA: Diagnosis not present

## 2022-08-09 DIAGNOSIS — N186 End stage renal disease: Secondary | ICD-10-CM | POA: Diagnosis not present

## 2022-08-09 DIAGNOSIS — D849 Immunodeficiency, unspecified: Secondary | ICD-10-CM | POA: Diagnosis not present

## 2022-08-09 NOTE — Telephone Encounter (Signed)
Advised Dr Elsworth Soho of Dr Marlan Palau new information and also gave the transplant team information since they are following patient more.

## 2022-08-09 NOTE — Telephone Encounter (Signed)
I have not personally evaluated/met this patient. From my review of the chart, it appears patient will be transferring care to [facility] clinic to remain under the care of Dr. Rosanna Randy.  Eulis Foster, MD  [facility]

## 2022-08-10 DIAGNOSIS — Z992 Dependence on renal dialysis: Secondary | ICD-10-CM | POA: Diagnosis not present

## 2022-08-10 DIAGNOSIS — F54 Psychological and behavioral factors associated with disorders or diseases classified elsewhere: Secondary | ICD-10-CM | POA: Diagnosis not present

## 2022-08-10 DIAGNOSIS — R112 Nausea with vomiting, unspecified: Secondary | ICD-10-CM | POA: Diagnosis not present

## 2022-08-10 DIAGNOSIS — R7881 Bacteremia: Secondary | ICD-10-CM | POA: Diagnosis not present

## 2022-08-10 DIAGNOSIS — F419 Anxiety disorder, unspecified: Secondary | ICD-10-CM | POA: Diagnosis not present

## 2022-08-10 DIAGNOSIS — R1312 Dysphagia, oropharyngeal phase: Secondary | ICD-10-CM | POA: Diagnosis not present

## 2022-08-10 DIAGNOSIS — N186 End stage renal disease: Secondary | ICD-10-CM | POA: Diagnosis not present

## 2022-08-10 DIAGNOSIS — D849 Immunodeficiency, unspecified: Secondary | ICD-10-CM | POA: Diagnosis not present

## 2022-08-10 DIAGNOSIS — R1310 Dysphagia, unspecified: Secondary | ICD-10-CM | POA: Diagnosis not present

## 2022-08-10 DIAGNOSIS — F4324 Adjustment disorder with disturbance of conduct: Secondary | ICD-10-CM | POA: Diagnosis not present

## 2022-08-10 DIAGNOSIS — Z944 Liver transplant status: Secondary | ICD-10-CM | POA: Diagnosis not present

## 2022-08-11 DIAGNOSIS — R1312 Dysphagia, oropharyngeal phase: Secondary | ICD-10-CM | POA: Diagnosis not present

## 2022-08-11 DIAGNOSIS — F331 Major depressive disorder, recurrent, moderate: Secondary | ICD-10-CM | POA: Diagnosis not present

## 2022-08-11 DIAGNOSIS — J90 Pleural effusion, not elsewhere classified: Secondary | ICD-10-CM | POA: Diagnosis not present

## 2022-08-11 DIAGNOSIS — D631 Anemia in chronic kidney disease: Secondary | ICD-10-CM | POA: Diagnosis not present

## 2022-08-11 DIAGNOSIS — Z944 Liver transplant status: Secondary | ICD-10-CM | POA: Diagnosis not present

## 2022-08-11 DIAGNOSIS — D849 Immunodeficiency, unspecified: Secondary | ICD-10-CM | POA: Diagnosis not present

## 2022-08-11 DIAGNOSIS — N186 End stage renal disease: Secondary | ICD-10-CM | POA: Diagnosis not present

## 2022-08-11 DIAGNOSIS — Z992 Dependence on renal dialysis: Secondary | ICD-10-CM | POA: Diagnosis not present

## 2022-08-12 ENCOUNTER — Encounter (INDEPENDENT_AMBULATORY_CARE_PROVIDER_SITE_OTHER): Payer: Self-pay

## 2022-08-12 ENCOUNTER — Encounter (INDEPENDENT_AMBULATORY_CARE_PROVIDER_SITE_OTHER): Payer: BC Managed Care – PPO

## 2022-08-12 ENCOUNTER — Encounter (INDEPENDENT_AMBULATORY_CARE_PROVIDER_SITE_OTHER): Payer: BC Managed Care – PPO | Admitting: Vascular Surgery

## 2022-08-12 ENCOUNTER — Other Ambulatory Visit (INDEPENDENT_AMBULATORY_CARE_PROVIDER_SITE_OTHER): Payer: BC Managed Care – PPO

## 2022-08-12 DIAGNOSIS — Z992 Dependence on renal dialysis: Secondary | ICD-10-CM | POA: Diagnosis not present

## 2022-08-12 DIAGNOSIS — Z944 Liver transplant status: Secondary | ICD-10-CM | POA: Diagnosis not present

## 2022-08-12 DIAGNOSIS — D849 Immunodeficiency, unspecified: Secondary | ICD-10-CM | POA: Diagnosis not present

## 2022-08-12 DIAGNOSIS — N186 End stage renal disease: Secondary | ICD-10-CM | POA: Diagnosis not present

## 2022-08-13 DIAGNOSIS — N186 End stage renal disease: Secondary | ICD-10-CM | POA: Diagnosis not present

## 2022-08-13 DIAGNOSIS — Z944 Liver transplant status: Secondary | ICD-10-CM | POA: Diagnosis not present

## 2022-08-13 DIAGNOSIS — D849 Immunodeficiency, unspecified: Secondary | ICD-10-CM | POA: Diagnosis not present

## 2022-08-13 DIAGNOSIS — J90 Pleural effusion, not elsewhere classified: Secondary | ICD-10-CM | POA: Diagnosis not present

## 2022-08-14 DIAGNOSIS — D849 Immunodeficiency, unspecified: Secondary | ICD-10-CM | POA: Diagnosis not present

## 2022-08-14 DIAGNOSIS — Z944 Liver transplant status: Secondary | ICD-10-CM | POA: Diagnosis not present

## 2022-08-14 DIAGNOSIS — Z992 Dependence on renal dialysis: Secondary | ICD-10-CM | POA: Diagnosis not present

## 2022-08-14 DIAGNOSIS — N186 End stage renal disease: Secondary | ICD-10-CM | POA: Diagnosis not present

## 2022-08-15 DIAGNOSIS — R1310 Dysphagia, unspecified: Secondary | ICD-10-CM | POA: Diagnosis not present

## 2022-08-15 DIAGNOSIS — D849 Immunodeficiency, unspecified: Secondary | ICD-10-CM | POA: Diagnosis not present

## 2022-08-15 DIAGNOSIS — Z944 Liver transplant status: Secondary | ICD-10-CM | POA: Diagnosis not present

## 2022-08-15 DIAGNOSIS — R112 Nausea with vomiting, unspecified: Secondary | ICD-10-CM | POA: Diagnosis not present

## 2022-08-16 DIAGNOSIS — D849 Immunodeficiency, unspecified: Secondary | ICD-10-CM | POA: Diagnosis not present

## 2022-08-16 DIAGNOSIS — Z944 Liver transplant status: Secondary | ICD-10-CM | POA: Diagnosis not present

## 2022-08-16 DIAGNOSIS — N186 End stage renal disease: Secondary | ICD-10-CM | POA: Diagnosis not present

## 2022-08-16 DIAGNOSIS — R5381 Other malaise: Secondary | ICD-10-CM | POA: Diagnosis not present

## 2022-08-16 DIAGNOSIS — Z992 Dependence on renal dialysis: Secondary | ICD-10-CM | POA: Diagnosis not present

## 2022-08-16 DIAGNOSIS — J90 Pleural effusion, not elsewhere classified: Secondary | ICD-10-CM | POA: Diagnosis not present

## 2022-08-16 DIAGNOSIS — F419 Anxiety disorder, unspecified: Secondary | ICD-10-CM | POA: Diagnosis not present

## 2022-08-17 DIAGNOSIS — Z944 Liver transplant status: Secondary | ICD-10-CM | POA: Diagnosis not present

## 2022-08-17 DIAGNOSIS — F419 Anxiety disorder, unspecified: Secondary | ICD-10-CM | POA: Diagnosis not present

## 2022-08-17 DIAGNOSIS — D849 Immunodeficiency, unspecified: Secondary | ICD-10-CM | POA: Diagnosis not present

## 2022-08-17 DIAGNOSIS — R5381 Other malaise: Secondary | ICD-10-CM | POA: Diagnosis not present

## 2022-08-18 DIAGNOSIS — D849 Immunodeficiency, unspecified: Secondary | ICD-10-CM | POA: Diagnosis not present

## 2022-08-18 DIAGNOSIS — R1312 Dysphagia, oropharyngeal phase: Secondary | ICD-10-CM | POA: Diagnosis not present

## 2022-08-18 DIAGNOSIS — Z992 Dependence on renal dialysis: Secondary | ICD-10-CM | POA: Diagnosis not present

## 2022-08-18 DIAGNOSIS — J90 Pleural effusion, not elsewhere classified: Secondary | ICD-10-CM | POA: Diagnosis not present

## 2022-08-18 DIAGNOSIS — N186 End stage renal disease: Secondary | ICD-10-CM | POA: Diagnosis not present

## 2022-08-18 DIAGNOSIS — Z944 Liver transplant status: Secondary | ICD-10-CM | POA: Diagnosis not present

## 2022-08-19 DIAGNOSIS — R5381 Other malaise: Secondary | ICD-10-CM | POA: Diagnosis not present

## 2022-08-19 DIAGNOSIS — D849 Immunodeficiency, unspecified: Secondary | ICD-10-CM | POA: Diagnosis not present

## 2022-08-19 DIAGNOSIS — F419 Anxiety disorder, unspecified: Secondary | ICD-10-CM | POA: Diagnosis not present

## 2022-08-19 DIAGNOSIS — Z944 Liver transplant status: Secondary | ICD-10-CM | POA: Diagnosis not present

## 2022-08-20 DIAGNOSIS — B259 Cytomegaloviral disease, unspecified: Secondary | ICD-10-CM | POA: Diagnosis not present

## 2022-08-20 DIAGNOSIS — N186 End stage renal disease: Secondary | ICD-10-CM | POA: Diagnosis not present

## 2022-08-20 DIAGNOSIS — R5381 Other malaise: Secondary | ICD-10-CM | POA: Diagnosis not present

## 2022-08-20 DIAGNOSIS — R7881 Bacteremia: Secondary | ICD-10-CM | POA: Diagnosis not present

## 2022-08-20 DIAGNOSIS — F419 Anxiety disorder, unspecified: Secondary | ICD-10-CM | POA: Diagnosis not present

## 2022-08-20 DIAGNOSIS — D849 Immunodeficiency, unspecified: Secondary | ICD-10-CM | POA: Diagnosis not present

## 2022-08-20 DIAGNOSIS — R509 Fever, unspecified: Secondary | ICD-10-CM | POA: Diagnosis not present

## 2022-08-20 DIAGNOSIS — Z992 Dependence on renal dialysis: Secondary | ICD-10-CM | POA: Diagnosis not present

## 2022-08-20 DIAGNOSIS — Z944 Liver transplant status: Secondary | ICD-10-CM | POA: Diagnosis not present

## 2022-08-21 DIAGNOSIS — D849 Immunodeficiency, unspecified: Secondary | ICD-10-CM | POA: Diagnosis not present

## 2022-08-21 DIAGNOSIS — G4733 Obstructive sleep apnea (adult) (pediatric): Secondary | ICD-10-CM | POA: Diagnosis not present

## 2022-08-21 DIAGNOSIS — R5381 Other malaise: Secondary | ICD-10-CM | POA: Diagnosis not present

## 2022-08-21 DIAGNOSIS — Z944 Liver transplant status: Secondary | ICD-10-CM | POA: Diagnosis not present

## 2022-08-21 DIAGNOSIS — F419 Anxiety disorder, unspecified: Secondary | ICD-10-CM | POA: Diagnosis not present

## 2022-08-22 DIAGNOSIS — Z944 Liver transplant status: Secondary | ICD-10-CM | POA: Diagnosis not present

## 2022-08-22 DIAGNOSIS — R5381 Other malaise: Secondary | ICD-10-CM | POA: Diagnosis not present

## 2022-08-22 DIAGNOSIS — F419 Anxiety disorder, unspecified: Secondary | ICD-10-CM | POA: Diagnosis not present

## 2022-08-22 DIAGNOSIS — D849 Immunodeficiency, unspecified: Secondary | ICD-10-CM | POA: Diagnosis not present

## 2022-08-23 DIAGNOSIS — B259 Cytomegaloviral disease, unspecified: Secondary | ICD-10-CM | POA: Diagnosis not present

## 2022-08-23 DIAGNOSIS — D849 Immunodeficiency, unspecified: Secondary | ICD-10-CM | POA: Diagnosis not present

## 2022-08-23 DIAGNOSIS — Z903 Acquired absence of stomach [part of]: Secondary | ICD-10-CM | POA: Diagnosis not present

## 2022-08-23 DIAGNOSIS — R1312 Dysphagia, oropharyngeal phase: Secondary | ICD-10-CM | POA: Diagnosis not present

## 2022-08-23 DIAGNOSIS — B9561 Methicillin susceptible Staphylococcus aureus infection as the cause of diseases classified elsewhere: Secondary | ICD-10-CM | POA: Diagnosis not present

## 2022-08-23 DIAGNOSIS — R7881 Bacteremia: Secondary | ICD-10-CM | POA: Diagnosis not present

## 2022-08-23 DIAGNOSIS — N186 End stage renal disease: Secondary | ICD-10-CM | POA: Diagnosis not present

## 2022-08-24 ENCOUNTER — Telehealth: Payer: Self-pay

## 2022-08-24 DIAGNOSIS — Z903 Acquired absence of stomach [part of]: Secondary | ICD-10-CM | POA: Diagnosis not present

## 2022-08-24 DIAGNOSIS — D849 Immunodeficiency, unspecified: Secondary | ICD-10-CM | POA: Diagnosis not present

## 2022-08-24 DIAGNOSIS — Z515 Encounter for palliative care: Secondary | ICD-10-CM | POA: Diagnosis not present

## 2022-08-24 DIAGNOSIS — R112 Nausea with vomiting, unspecified: Secondary | ICD-10-CM | POA: Diagnosis not present

## 2022-08-24 DIAGNOSIS — N186 End stage renal disease: Secondary | ICD-10-CM | POA: Diagnosis not present

## 2022-08-24 DIAGNOSIS — R1312 Dysphagia, oropharyngeal phase: Secondary | ICD-10-CM | POA: Diagnosis not present

## 2022-08-24 DIAGNOSIS — F4324 Adjustment disorder with disturbance of conduct: Secondary | ICD-10-CM | POA: Diagnosis not present

## 2022-08-24 NOTE — Telephone Encounter (Signed)
940 am.  Follow up phone call made to patient.  He advised he remains at [facility] and is uncertain when he will be able to return home.  Advised patient Palliative Care would follow up after hospital discharge.

## 2022-08-25 DIAGNOSIS — R509 Fever, unspecified: Secondary | ICD-10-CM | POA: Diagnosis not present

## 2022-08-25 DIAGNOSIS — R7881 Bacteremia: Secondary | ICD-10-CM | POA: Diagnosis not present

## 2022-08-25 DIAGNOSIS — D849 Immunodeficiency, unspecified: Secondary | ICD-10-CM | POA: Diagnosis not present

## 2022-08-25 DIAGNOSIS — K264 Chronic or unspecified duodenal ulcer with hemorrhage: Secondary | ICD-10-CM | POA: Diagnosis not present

## 2022-08-25 DIAGNOSIS — K2940 Chronic atrophic gastritis without bleeding: Secondary | ICD-10-CM | POA: Diagnosis not present

## 2022-08-25 DIAGNOSIS — K2970 Gastritis, unspecified, without bleeding: Secondary | ICD-10-CM | POA: Diagnosis not present

## 2022-08-25 DIAGNOSIS — R1312 Dysphagia, oropharyngeal phase: Secondary | ICD-10-CM | POA: Diagnosis not present

## 2022-08-25 DIAGNOSIS — N186 End stage renal disease: Secondary | ICD-10-CM | POA: Diagnosis not present

## 2022-08-25 DIAGNOSIS — K2980 Duodenitis without bleeding: Secondary | ICD-10-CM | POA: Diagnosis not present

## 2022-08-25 DIAGNOSIS — Z903 Acquired absence of stomach [part of]: Secondary | ICD-10-CM | POA: Diagnosis not present

## 2022-08-25 DIAGNOSIS — B259 Cytomegaloviral disease, unspecified: Secondary | ICD-10-CM | POA: Diagnosis not present

## 2022-08-25 DIAGNOSIS — K208 Other esophagitis without bleeding: Secondary | ICD-10-CM | POA: Diagnosis not present

## 2022-08-25 DIAGNOSIS — K3189 Other diseases of stomach and duodenum: Secondary | ICD-10-CM | POA: Diagnosis not present

## 2022-08-25 DIAGNOSIS — T183XXA Foreign body in small intestine, initial encounter: Secondary | ICD-10-CM | POA: Diagnosis not present

## 2022-08-25 DIAGNOSIS — K269 Duodenal ulcer, unspecified as acute or chronic, without hemorrhage or perforation: Secondary | ICD-10-CM | POA: Diagnosis not present

## 2022-08-25 DIAGNOSIS — B9681 Helicobacter pylori [H. pylori] as the cause of diseases classified elsewhere: Secondary | ICD-10-CM | POA: Diagnosis not present

## 2022-08-25 DIAGNOSIS — K2289 Other specified disease of esophagus: Secondary | ICD-10-CM | POA: Diagnosis not present

## 2022-08-25 DIAGNOSIS — Z944 Liver transplant status: Secondary | ICD-10-CM | POA: Diagnosis not present

## 2022-08-26 DIAGNOSIS — D849 Immunodeficiency, unspecified: Secondary | ICD-10-CM | POA: Diagnosis not present

## 2022-08-26 DIAGNOSIS — R7881 Bacteremia: Secondary | ICD-10-CM | POA: Diagnosis not present

## 2022-08-26 DIAGNOSIS — B9561 Methicillin susceptible Staphylococcus aureus infection as the cause of diseases classified elsewhere: Secondary | ICD-10-CM | POA: Diagnosis not present

## 2022-08-26 DIAGNOSIS — B259 Cytomegaloviral disease, unspecified: Secondary | ICD-10-CM | POA: Diagnosis not present

## 2022-08-26 DIAGNOSIS — Z903 Acquired absence of stomach [part of]: Secondary | ICD-10-CM | POA: Diagnosis not present

## 2022-08-26 DIAGNOSIS — R1312 Dysphagia, oropharyngeal phase: Secondary | ICD-10-CM | POA: Diagnosis not present

## 2022-08-26 DIAGNOSIS — N186 End stage renal disease: Secondary | ICD-10-CM | POA: Diagnosis not present

## 2022-08-27 DIAGNOSIS — D849 Immunodeficiency, unspecified: Secondary | ICD-10-CM | POA: Diagnosis not present

## 2022-08-27 DIAGNOSIS — N186 End stage renal disease: Secondary | ICD-10-CM | POA: Diagnosis not present

## 2022-08-27 DIAGNOSIS — R2689 Other abnormalities of gait and mobility: Secondary | ICD-10-CM | POA: Diagnosis not present

## 2022-08-27 DIAGNOSIS — B9561 Methicillin susceptible Staphylococcus aureus infection as the cause of diseases classified elsewhere: Secondary | ICD-10-CM | POA: Diagnosis not present

## 2022-08-27 DIAGNOSIS — R1312 Dysphagia, oropharyngeal phase: Secondary | ICD-10-CM | POA: Diagnosis not present

## 2022-08-27 DIAGNOSIS — R7881 Bacteremia: Secondary | ICD-10-CM | POA: Diagnosis not present

## 2022-08-27 DIAGNOSIS — F54 Psychological and behavioral factors associated with disorders or diseases classified elsewhere: Secondary | ICD-10-CM | POA: Diagnosis not present

## 2022-08-27 DIAGNOSIS — B259 Cytomegaloviral disease, unspecified: Secondary | ICD-10-CM | POA: Diagnosis not present

## 2022-08-27 DIAGNOSIS — F32A Depression, unspecified: Secondary | ICD-10-CM | POA: Diagnosis not present

## 2022-08-27 DIAGNOSIS — Z903 Acquired absence of stomach [part of]: Secondary | ICD-10-CM | POA: Diagnosis not present

## 2022-08-27 DIAGNOSIS — Z944 Liver transplant status: Secondary | ICD-10-CM | POA: Diagnosis not present

## 2022-08-28 DIAGNOSIS — N186 End stage renal disease: Secondary | ICD-10-CM | POA: Diagnosis not present

## 2022-08-28 DIAGNOSIS — Z944 Liver transplant status: Secondary | ICD-10-CM | POA: Diagnosis not present

## 2022-08-28 DIAGNOSIS — Z992 Dependence on renal dialysis: Secondary | ICD-10-CM | POA: Diagnosis not present

## 2022-08-28 DIAGNOSIS — D849 Immunodeficiency, unspecified: Secondary | ICD-10-CM | POA: Diagnosis not present

## 2022-08-28 DIAGNOSIS — J9601 Acute respiratory failure with hypoxia: Secondary | ICD-10-CM | POA: Diagnosis not present

## 2022-08-28 DIAGNOSIS — R1312 Dysphagia, oropharyngeal phase: Secondary | ICD-10-CM | POA: Diagnosis not present

## 2022-08-28 DIAGNOSIS — Z903 Acquired absence of stomach [part of]: Secondary | ICD-10-CM | POA: Diagnosis not present

## 2022-08-28 DIAGNOSIS — J90 Pleural effusion, not elsewhere classified: Secondary | ICD-10-CM | POA: Diagnosis not present

## 2022-08-29 DIAGNOSIS — N186 End stage renal disease: Secondary | ICD-10-CM | POA: Diagnosis not present

## 2022-08-29 DIAGNOSIS — R1312 Dysphagia, oropharyngeal phase: Secondary | ICD-10-CM | POA: Diagnosis not present

## 2022-08-29 DIAGNOSIS — Z903 Acquired absence of stomach [part of]: Secondary | ICD-10-CM | POA: Diagnosis not present

## 2022-08-29 DIAGNOSIS — D849 Immunodeficiency, unspecified: Secondary | ICD-10-CM | POA: Diagnosis not present

## 2022-08-30 DIAGNOSIS — N186 End stage renal disease: Secondary | ICD-10-CM | POA: Diagnosis not present

## 2022-08-30 DIAGNOSIS — R1312 Dysphagia, oropharyngeal phase: Secondary | ICD-10-CM | POA: Diagnosis not present

## 2022-08-31 DIAGNOSIS — R1312 Dysphagia, oropharyngeal phase: Secondary | ICD-10-CM | POA: Diagnosis not present

## 2022-08-31 DIAGNOSIS — N186 End stage renal disease: Secondary | ICD-10-CM | POA: Diagnosis not present

## 2022-09-01 DIAGNOSIS — R1312 Dysphagia, oropharyngeal phase: Secondary | ICD-10-CM | POA: Diagnosis not present

## 2022-09-01 DIAGNOSIS — N186 End stage renal disease: Secondary | ICD-10-CM | POA: Diagnosis not present

## 2022-09-02 DIAGNOSIS — J90 Pleural effusion, not elsewhere classified: Secondary | ICD-10-CM | POA: Diagnosis not present

## 2022-09-02 DIAGNOSIS — Z4659 Encounter for fitting and adjustment of other gastrointestinal appliance and device: Secondary | ICD-10-CM | POA: Diagnosis not present

## 2022-09-02 DIAGNOSIS — Z944 Liver transplant status: Secondary | ICD-10-CM | POA: Diagnosis not present

## 2022-09-02 DIAGNOSIS — E440 Moderate protein-calorie malnutrition: Secondary | ICD-10-CM | POA: Diagnosis not present

## 2022-09-02 DIAGNOSIS — J9811 Atelectasis: Secondary | ICD-10-CM | POA: Diagnosis not present

## 2022-09-02 DIAGNOSIS — J811 Chronic pulmonary edema: Secondary | ICD-10-CM | POA: Diagnosis not present

## 2022-09-02 DIAGNOSIS — R1312 Dysphagia, oropharyngeal phase: Secondary | ICD-10-CM | POA: Diagnosis not present

## 2022-09-03 ENCOUNTER — Telehealth: Payer: Self-pay

## 2022-09-03 DIAGNOSIS — N186 End stage renal disease: Secondary | ICD-10-CM | POA: Diagnosis not present

## 2022-09-03 DIAGNOSIS — R1312 Dysphagia, oropharyngeal phase: Secondary | ICD-10-CM | POA: Diagnosis not present

## 2022-09-03 NOTE — Telephone Encounter (Signed)
Received faxed refill request for Trulicity. Per chart there is a note patient is no longer taking this medication.  TRULICITY [id number] SOPN  Patient Requested Removal Patient not taking.  Reported on 06/03/2022  LVm for patient to return call - need to clarify if he is taking the medication or not  OK for PEC to document response

## 2022-09-04 DIAGNOSIS — N202 Calculus of kidney with calculus of ureter: Secondary | ICD-10-CM | POA: Diagnosis not present

## 2022-09-04 DIAGNOSIS — J811 Chronic pulmonary edema: Secondary | ICD-10-CM | POA: Diagnosis not present

## 2022-09-04 DIAGNOSIS — Z944 Liver transplant status: Secondary | ICD-10-CM | POA: Diagnosis not present

## 2022-09-04 DIAGNOSIS — R1312 Dysphagia, oropharyngeal phase: Secondary | ICD-10-CM | POA: Diagnosis not present

## 2022-09-04 DIAGNOSIS — Z4659 Encounter for fitting and adjustment of other gastrointestinal appliance and device: Secondary | ICD-10-CM | POA: Diagnosis not present

## 2022-09-04 DIAGNOSIS — N186 End stage renal disease: Secondary | ICD-10-CM | POA: Diagnosis not present

## 2022-09-04 DIAGNOSIS — J90 Pleural effusion, not elsewhere classified: Secondary | ICD-10-CM | POA: Diagnosis not present

## 2022-09-04 DIAGNOSIS — J9811 Atelectasis: Secondary | ICD-10-CM | POA: Diagnosis not present

## 2022-09-05 DIAGNOSIS — N186 End stage renal disease: Secondary | ICD-10-CM | POA: Diagnosis not present

## 2022-09-05 DIAGNOSIS — R1312 Dysphagia, oropharyngeal phase: Secondary | ICD-10-CM | POA: Diagnosis not present

## 2022-09-06 DIAGNOSIS — F05 Delirium due to known physiological condition: Secondary | ICD-10-CM | POA: Diagnosis not present

## 2022-09-06 DIAGNOSIS — D631 Anemia in chronic kidney disease: Secondary | ICD-10-CM | POA: Diagnosis not present

## 2022-09-06 DIAGNOSIS — D849 Immunodeficiency, unspecified: Secondary | ICD-10-CM | POA: Diagnosis not present

## 2022-09-06 DIAGNOSIS — Z944 Liver transplant status: Secondary | ICD-10-CM | POA: Diagnosis not present

## 2022-09-06 DIAGNOSIS — N186 End stage renal disease: Secondary | ICD-10-CM | POA: Diagnosis not present

## 2022-09-06 DIAGNOSIS — F419 Anxiety disorder, unspecified: Secondary | ICD-10-CM | POA: Diagnosis not present

## 2022-09-06 DIAGNOSIS — F32A Depression, unspecified: Secondary | ICD-10-CM | POA: Diagnosis not present

## 2022-09-06 DIAGNOSIS — Z903 Acquired absence of stomach [part of]: Secondary | ICD-10-CM | POA: Diagnosis not present

## 2022-09-07 ENCOUNTER — Telehealth: Payer: Self-pay | Admitting: *Deleted

## 2022-09-07 DIAGNOSIS — Z944 Liver transplant status: Secondary | ICD-10-CM | POA: Diagnosis not present

## 2022-09-07 DIAGNOSIS — N186 End stage renal disease: Secondary | ICD-10-CM | POA: Diagnosis not present

## 2022-09-07 DIAGNOSIS — D849 Immunodeficiency, unspecified: Secondary | ICD-10-CM | POA: Diagnosis not present

## 2022-09-07 DIAGNOSIS — Z903 Acquired absence of stomach [part of]: Secondary | ICD-10-CM | POA: Diagnosis not present

## 2022-09-07 NOTE — Telephone Encounter (Signed)
Spoke to pt regarding Trulicity Pen 0.75mg /0.68ml. pt verified not taking the medication and declined for f/u appt. Pt decided to follow-up with Dr. Rosanna Randy.

## 2022-09-08 DIAGNOSIS — D849 Immunodeficiency, unspecified: Secondary | ICD-10-CM | POA: Diagnosis not present

## 2022-09-08 DIAGNOSIS — D631 Anemia in chronic kidney disease: Secondary | ICD-10-CM | POA: Diagnosis not present

## 2022-09-08 DIAGNOSIS — N186 End stage renal disease: Secondary | ICD-10-CM | POA: Diagnosis not present

## 2022-09-08 DIAGNOSIS — Z944 Liver transplant status: Secondary | ICD-10-CM | POA: Diagnosis not present

## 2022-09-08 DIAGNOSIS — Z903 Acquired absence of stomach [part of]: Secondary | ICD-10-CM | POA: Diagnosis not present

## 2022-09-09 DIAGNOSIS — N186 End stage renal disease: Secondary | ICD-10-CM | POA: Diagnosis not present

## 2022-09-09 DIAGNOSIS — D849 Immunodeficiency, unspecified: Secondary | ICD-10-CM | POA: Diagnosis not present

## 2022-09-09 DIAGNOSIS — Z944 Liver transplant status: Secondary | ICD-10-CM | POA: Diagnosis not present

## 2022-09-09 DIAGNOSIS — Z903 Acquired absence of stomach [part of]: Secondary | ICD-10-CM | POA: Diagnosis not present

## 2022-09-10 DIAGNOSIS — Z944 Liver transplant status: Secondary | ICD-10-CM | POA: Diagnosis not present

## 2022-09-10 DIAGNOSIS — N186 End stage renal disease: Secondary | ICD-10-CM | POA: Diagnosis not present

## 2022-09-10 DIAGNOSIS — D631 Anemia in chronic kidney disease: Secondary | ICD-10-CM | POA: Diagnosis not present

## 2022-09-10 DIAGNOSIS — J90 Pleural effusion, not elsewhere classified: Secondary | ICD-10-CM | POA: Diagnosis not present

## 2022-09-10 DIAGNOSIS — Z903 Acquired absence of stomach [part of]: Secondary | ICD-10-CM | POA: Diagnosis not present

## 2022-09-10 DIAGNOSIS — D849 Immunodeficiency, unspecified: Secondary | ICD-10-CM | POA: Diagnosis not present

## 2022-09-11 DIAGNOSIS — N186 End stage renal disease: Secondary | ICD-10-CM | POA: Diagnosis not present

## 2022-09-11 DIAGNOSIS — D849 Immunodeficiency, unspecified: Secondary | ICD-10-CM | POA: Diagnosis not present

## 2022-09-11 DIAGNOSIS — Z903 Acquired absence of stomach [part of]: Secondary | ICD-10-CM | POA: Diagnosis not present

## 2022-09-11 DIAGNOSIS — I259 Chronic ischemic heart disease, unspecified: Secondary | ICD-10-CM | POA: Diagnosis not present

## 2022-09-11 DIAGNOSIS — R2689 Other abnormalities of gait and mobility: Secondary | ICD-10-CM | POA: Diagnosis not present

## 2022-09-11 DIAGNOSIS — J90 Pleural effusion, not elsewhere classified: Secondary | ICD-10-CM | POA: Diagnosis not present

## 2022-09-11 DIAGNOSIS — J9601 Acute respiratory failure with hypoxia: Secondary | ICD-10-CM | POA: Diagnosis not present

## 2022-09-11 DIAGNOSIS — Z944 Liver transplant status: Secondary | ICD-10-CM | POA: Diagnosis not present

## 2022-09-11 DIAGNOSIS — D631 Anemia in chronic kidney disease: Secondary | ICD-10-CM | POA: Diagnosis not present

## 2022-09-11 DIAGNOSIS — J9602 Acute respiratory failure with hypercapnia: Secondary | ICD-10-CM | POA: Diagnosis not present

## 2022-09-12 DIAGNOSIS — J9601 Acute respiratory failure with hypoxia: Secondary | ICD-10-CM | POA: Diagnosis not present

## 2022-09-12 DIAGNOSIS — Z4659 Encounter for fitting and adjustment of other gastrointestinal appliance and device: Secondary | ICD-10-CM | POA: Diagnosis not present

## 2022-09-12 DIAGNOSIS — Z9884 Bariatric surgery status: Secondary | ICD-10-CM | POA: Diagnosis not present

## 2022-09-12 DIAGNOSIS — D849 Immunodeficiency, unspecified: Secondary | ICD-10-CM | POA: Diagnosis not present

## 2022-09-12 DIAGNOSIS — N186 End stage renal disease: Secondary | ICD-10-CM | POA: Diagnosis not present

## 2022-09-12 DIAGNOSIS — Z903 Acquired absence of stomach [part of]: Secondary | ICD-10-CM | POA: Diagnosis not present

## 2022-09-12 DIAGNOSIS — D631 Anemia in chronic kidney disease: Secondary | ICD-10-CM | POA: Diagnosis not present

## 2022-09-12 DIAGNOSIS — I959 Hypotension, unspecified: Secondary | ICD-10-CM | POA: Diagnosis not present

## 2022-09-12 DIAGNOSIS — J90 Pleural effusion, not elsewhere classified: Secondary | ICD-10-CM | POA: Diagnosis not present

## 2022-09-12 DIAGNOSIS — J9602 Acute respiratory failure with hypercapnia: Secondary | ICD-10-CM | POA: Diagnosis not present

## 2022-09-12 DIAGNOSIS — Z944 Liver transplant status: Secondary | ICD-10-CM | POA: Diagnosis not present

## 2022-09-12 DIAGNOSIS — R509 Fever, unspecified: Secondary | ICD-10-CM | POA: Diagnosis not present

## 2022-09-13 DIAGNOSIS — J90 Pleural effusion, not elsewhere classified: Secondary | ICD-10-CM | POA: Diagnosis not present

## 2022-09-13 DIAGNOSIS — Z992 Dependence on renal dialysis: Secondary | ICD-10-CM | POA: Diagnosis not present

## 2022-09-13 DIAGNOSIS — J9601 Acute respiratory failure with hypoxia: Secondary | ICD-10-CM | POA: Diagnosis not present

## 2022-09-13 DIAGNOSIS — N186 End stage renal disease: Secondary | ICD-10-CM | POA: Diagnosis not present

## 2022-09-13 DIAGNOSIS — D631 Anemia in chronic kidney disease: Secondary | ICD-10-CM | POA: Diagnosis not present

## 2022-09-13 DIAGNOSIS — J9602 Acute respiratory failure with hypercapnia: Secondary | ICD-10-CM | POA: Diagnosis not present

## 2022-09-13 DIAGNOSIS — I959 Hypotension, unspecified: Secondary | ICD-10-CM | POA: Diagnosis not present

## 2022-09-13 DIAGNOSIS — R109 Unspecified abdominal pain: Secondary | ICD-10-CM | POA: Diagnosis not present

## 2022-09-13 DIAGNOSIS — R0689 Other abnormalities of breathing: Secondary | ICD-10-CM | POA: Diagnosis not present

## 2022-09-13 DIAGNOSIS — Z944 Liver transplant status: Secondary | ICD-10-CM | POA: Diagnosis not present

## 2022-09-13 DIAGNOSIS — R2689 Other abnormalities of gait and mobility: Secondary | ICD-10-CM | POA: Diagnosis not present

## 2022-09-14 DIAGNOSIS — J90 Pleural effusion, not elsewhere classified: Secondary | ICD-10-CM | POA: Diagnosis not present

## 2022-09-14 DIAGNOSIS — Z7189 Other specified counseling: Secondary | ICD-10-CM | POA: Diagnosis not present

## 2022-09-14 DIAGNOSIS — Z944 Liver transplant status: Secondary | ICD-10-CM | POA: Diagnosis not present

## 2022-09-14 DIAGNOSIS — Z515 Encounter for palliative care: Secondary | ICD-10-CM | POA: Diagnosis not present

## 2022-09-14 DIAGNOSIS — Z992 Dependence on renal dialysis: Secondary | ICD-10-CM | POA: Diagnosis not present

## 2022-09-14 DIAGNOSIS — D631 Anemia in chronic kidney disease: Secondary | ICD-10-CM | POA: Diagnosis not present

## 2022-09-14 DIAGNOSIS — J9602 Acute respiratory failure with hypercapnia: Secondary | ICD-10-CM | POA: Diagnosis not present

## 2022-09-14 DIAGNOSIS — R2689 Other abnormalities of gait and mobility: Secondary | ICD-10-CM | POA: Diagnosis not present

## 2022-09-14 DIAGNOSIS — I959 Hypotension, unspecified: Secondary | ICD-10-CM | POA: Diagnosis not present

## 2022-09-14 DIAGNOSIS — R0689 Other abnormalities of breathing: Secondary | ICD-10-CM | POA: Diagnosis not present

## 2022-09-14 DIAGNOSIS — J9601 Acute respiratory failure with hypoxia: Secondary | ICD-10-CM | POA: Diagnosis not present

## 2022-09-14 DIAGNOSIS — N186 End stage renal disease: Secondary | ICD-10-CM | POA: Diagnosis not present

## 2022-09-15 DIAGNOSIS — R0689 Other abnormalities of breathing: Secondary | ICD-10-CM | POA: Diagnosis not present

## 2022-09-15 DIAGNOSIS — N186 End stage renal disease: Secondary | ICD-10-CM | POA: Diagnosis not present

## 2022-09-15 DIAGNOSIS — R2689 Other abnormalities of gait and mobility: Secondary | ICD-10-CM | POA: Diagnosis not present

## 2022-09-15 DIAGNOSIS — J9602 Acute respiratory failure with hypercapnia: Secondary | ICD-10-CM | POA: Diagnosis not present

## 2022-09-15 DIAGNOSIS — Z7189 Other specified counseling: Secondary | ICD-10-CM | POA: Diagnosis not present

## 2022-09-15 DIAGNOSIS — J90 Pleural effusion, not elsewhere classified: Secondary | ICD-10-CM | POA: Diagnosis not present

## 2022-09-15 DIAGNOSIS — Z515 Encounter for palliative care: Secondary | ICD-10-CM | POA: Diagnosis not present

## 2022-09-15 DIAGNOSIS — I959 Hypotension, unspecified: Secondary | ICD-10-CM | POA: Diagnosis not present

## 2022-09-15 DIAGNOSIS — J9601 Acute respiratory failure with hypoxia: Secondary | ICD-10-CM | POA: Diagnosis not present

## 2022-09-15 DIAGNOSIS — Z944 Liver transplant status: Secondary | ICD-10-CM | POA: Diagnosis not present

## 2022-09-17 ENCOUNTER — Telehealth: Payer: Self-pay | Admitting: *Deleted

## 2022-09-17 NOTE — Patient Outreach (Signed)
  Care Coordination   Follow Up Visit Note   09/17/2022 Name: Jesse Zimmerman MRN: [id number] DOB: January 25, 1961  Jesse Zimmerman is a 63 y.o. year old male who sees Eulas Post, MD for primary care.    Noted per chart, patient passed away on 2022-09-26.    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   Provided education to patient re: resources providing primary care through home visits, declines stating they would like to keep Dr. Rosanna Randy as PCP Discussed plans with patient for ongoing care management follow up and provided patient with direct contact information for care management team Advised patient to discuss questions regarding ongoing weakness with provider The patient expresses concern regarding need for increased help in the home.  Wife works from home but has limited ability to help during the day.  He is interested in hiring a private duty nurse to help with ADLs.  He has list of home agencies that provide private duty care HD sessions MWF, per wife, friends are helping to get patient out of the home and on the [place] transportation system.   Patient now has PICC line and PEG for feeding, wife report she is aware of how to care for both.   Update 3/29 - Patient expired on 2022/09/26           SDOH assessments and interventions completed:  No     Care Coordination Interventions:  No, not indicated   Follow up plan: No further intervention required.   Encounter Outcome:  Pt. Visit Completed   Valente David, RN, MSN, [place] Care Management Care Management  Coordinator [phone]

## 2022-09-20 DEATH — deceased

## 2022-10-28 ENCOUNTER — Encounter (INDEPENDENT_AMBULATORY_CARE_PROVIDER_SITE_OTHER): Payer: BC Managed Care – PPO

## 2022-10-28 ENCOUNTER — Encounter (INDEPENDENT_AMBULATORY_CARE_PROVIDER_SITE_OTHER): Payer: BC Managed Care – PPO | Admitting: Vascular Surgery

## 2022-10-28 ENCOUNTER — Encounter (INDEPENDENT_AMBULATORY_CARE_PROVIDER_SITE_OTHER): Payer: Self-pay

## 2022-10-28 ENCOUNTER — Other Ambulatory Visit (INDEPENDENT_AMBULATORY_CARE_PROVIDER_SITE_OTHER): Payer: BC Managed Care – PPO

## 2023-08-05 IMAGING — CT CT ABD-PELV W/O CM
2 of 4 series · 16 of 46 positions shown, 18 images · non-contrast
Comparison: CT abdomen pelvis dated June 11, 2013.

CLINICAL DATA: Abdominal distention and weight gain. History of
cirrhosis.

EXAM:
CT ABDOMEN AND PELVIS WITHOUT CONTRAST
TECHNIQUE: Multidetector CT imaging of the abdomen and pelvis was performed
following the standard protocol without IV contrast.

[Series 2: axial st · axial · 0.98mm/px · z∈[-527,-32]mm · 13 of 109 slices shown, 15 images]
[im 5/109  soft-tissue]
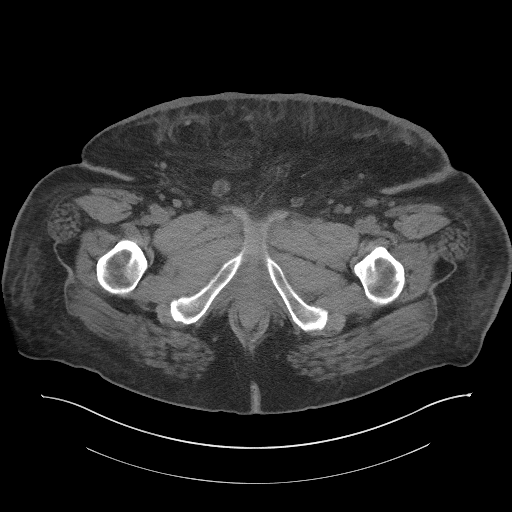
[im 5/109  bone]
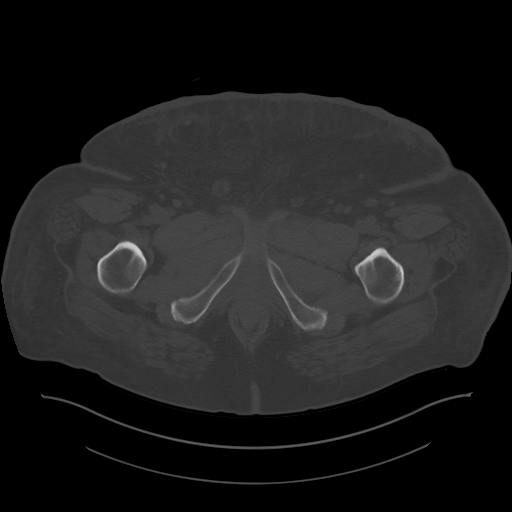
[im 13/109  soft-tissue]
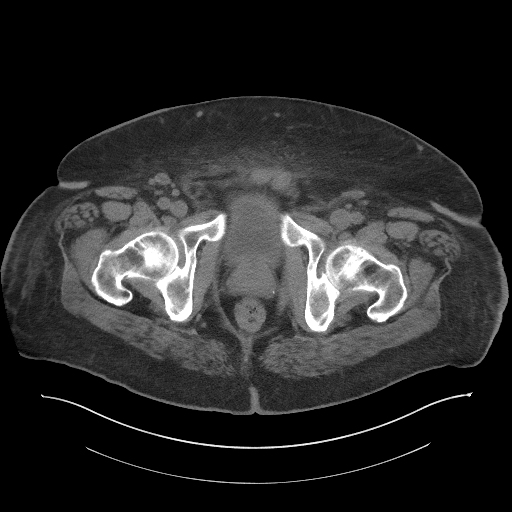
[im 22/109  soft-tissue]
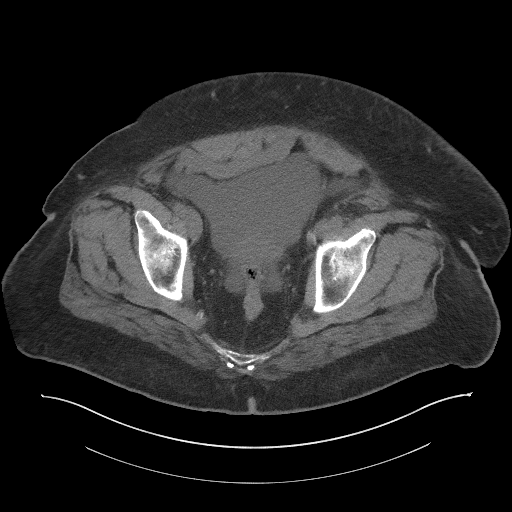
[im 31/109  soft-tissue]
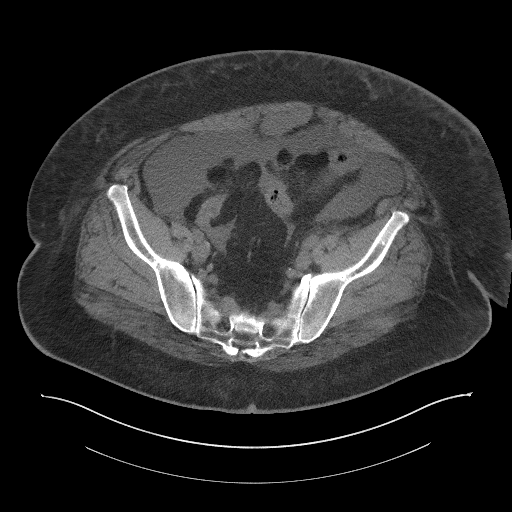
[im 39/109  soft-tissue]
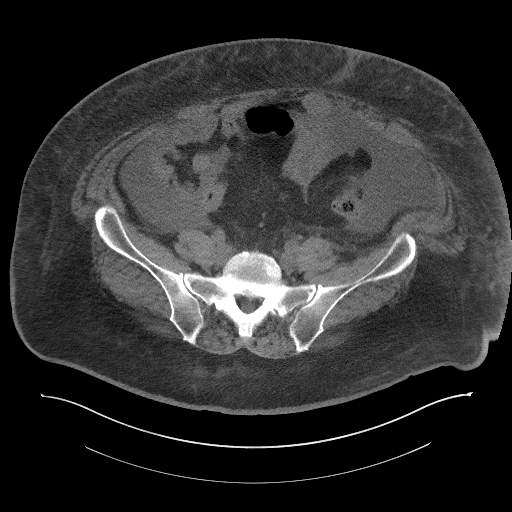
[im 48/109  soft-tissue]
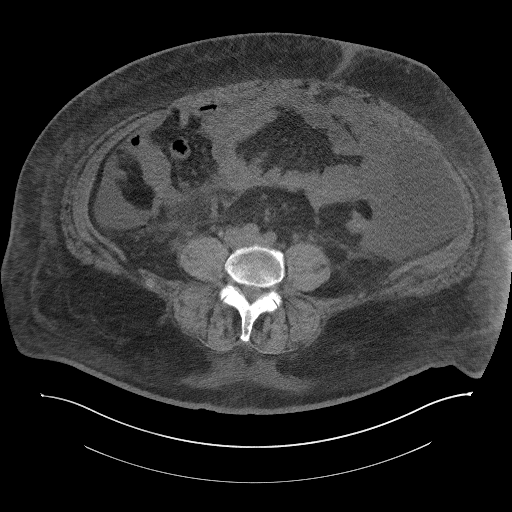
[im 57/109  soft-tissue]
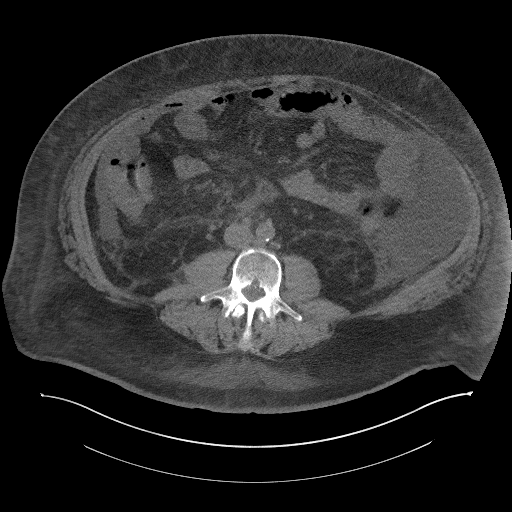
[im 61/109  soft-tissue]
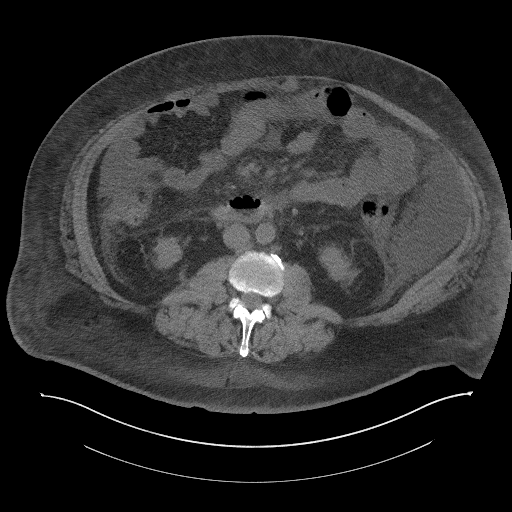
[im 70/109  soft-tissue]
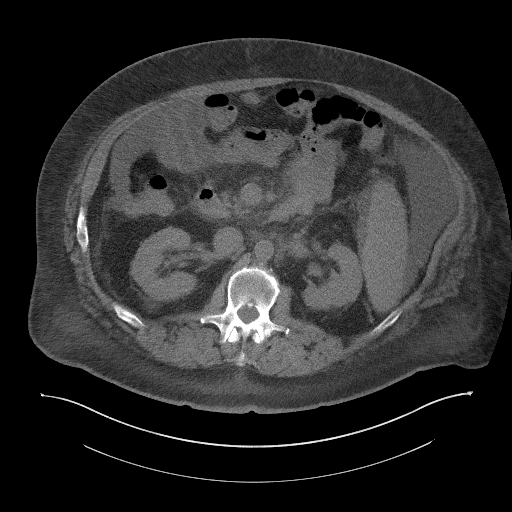
[im 70/109  bone]
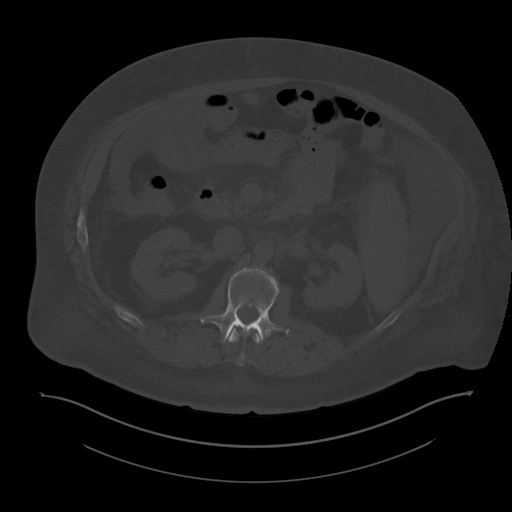
[im 78/109  soft-tissue]
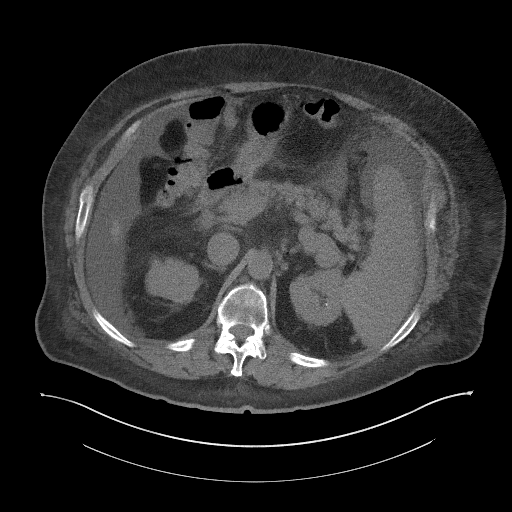
[im 87/109  soft-tissue]
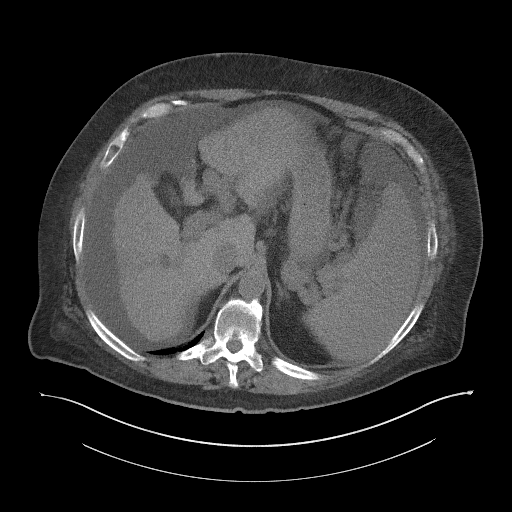
[im 96/109  soft-tissue]
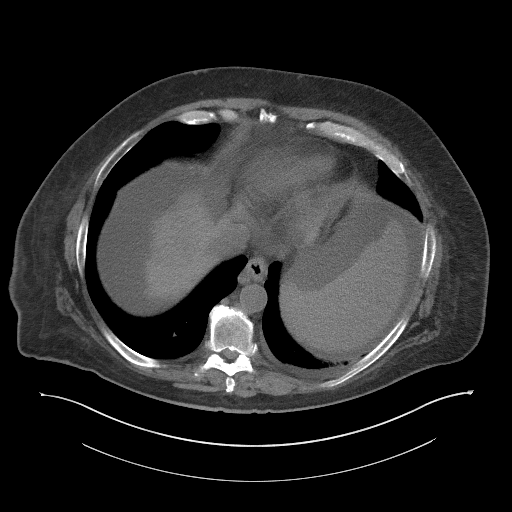
[im 104/109  soft-tissue]
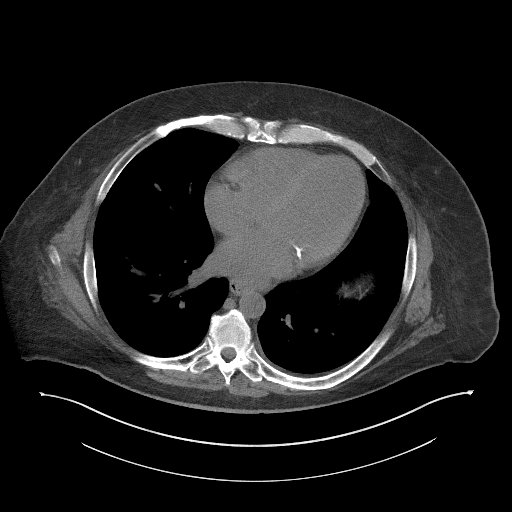

[Series 5: coronal st · coronal · 1.01mm/px · 3 of 130 slices shown]
[im 44/130  soft-tissue]
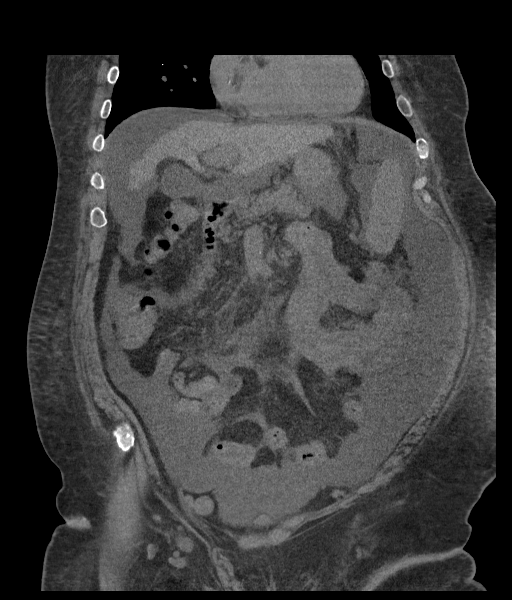
[im 58/130  soft-tissue]
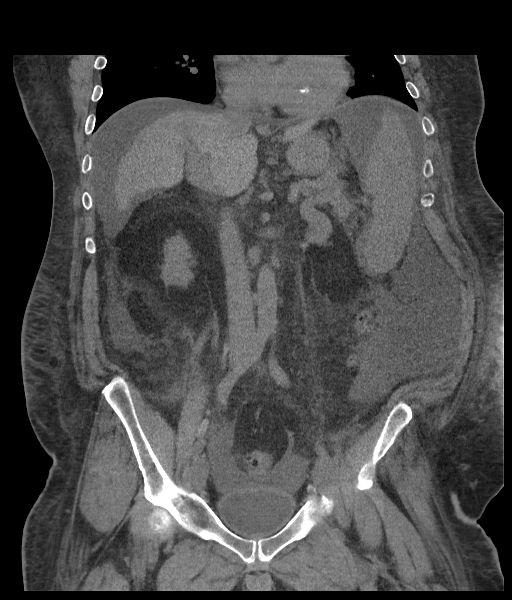
[im 72/130  soft-tissue]
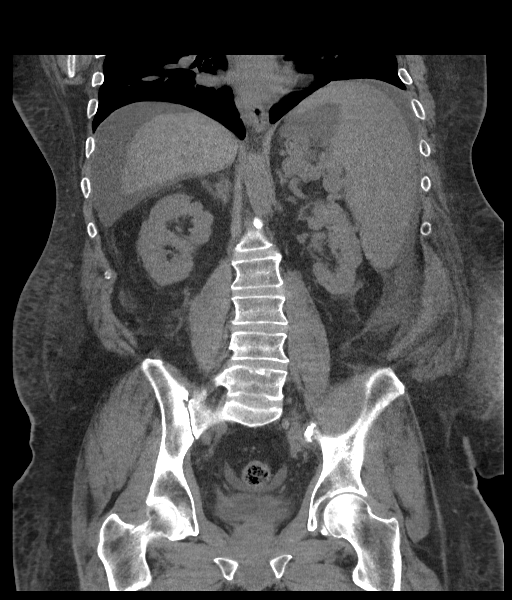

[16 of 46 positions shown; findings below may reference images not displayed]

FINDINGS: Lower chest: Trace left pleural effusion. Hypodense blood pool
compared to myocardium.

Hepatobiliary: Small liver with nodular contour. 1.0 cm cyst in the
right hepatic lobe. Small gallstone. No gallbladder wall thickening
or biliary dilatation.

Pancreas: Unremarkable. No pancreatic ductal dilatation or
surrounding inflammatory changes.

Spleen: Unchanged moderate to severe splenomegaly.

Adrenals/Urinary Tract: Adrenal glands are unremarkable. Multiple
bilateral renal calculi again noted. 3.9 cm right renal simple cyst,
slightly increased in size since 8001. No hydronephrosis. The
bladder is unremarkable.

Stomach/Bowel: Stomach is within normal limits. Diminutive or absent
appendix. No evidence of bowel wall thickening, distention, or
inflammatory changes.

Vascular/Lymphatic: Prominent subcentimeter periportal and upper
periaortic lymph nodes, likely reactive. Large splenorenal shunt.
Recanalized umbilical vein.

Reproductive: Prostate is unremarkable.

Other: Moderate ascites.  No pneumoperitoneum.

Musculoskeletal: No acute or significant osseous findings. Diffuse
anasarca.
IMPRESSION: 1. No acute intra-abdominal process.
2. Cirrhosis with sequelae of portal hypertension. Moderate ascites.
3. Cholelithiasis.
4. Bilateral nonobstructive nephrolithiasis.
5. Trace left pleural effusion.

## 2023-08-05 IMAGING — CR DG CHEST 2V
1 series · 2 of 2 positions shown · non-contrast
Comparison: 01/22/2020

CLINICAL DATA: cough, SOB

EXAM:
CHEST - 2 VIEW

[Series 1: dg chest 2 view · 0.14mm/px · 2 of 2 slices shown]
[im 1/2]
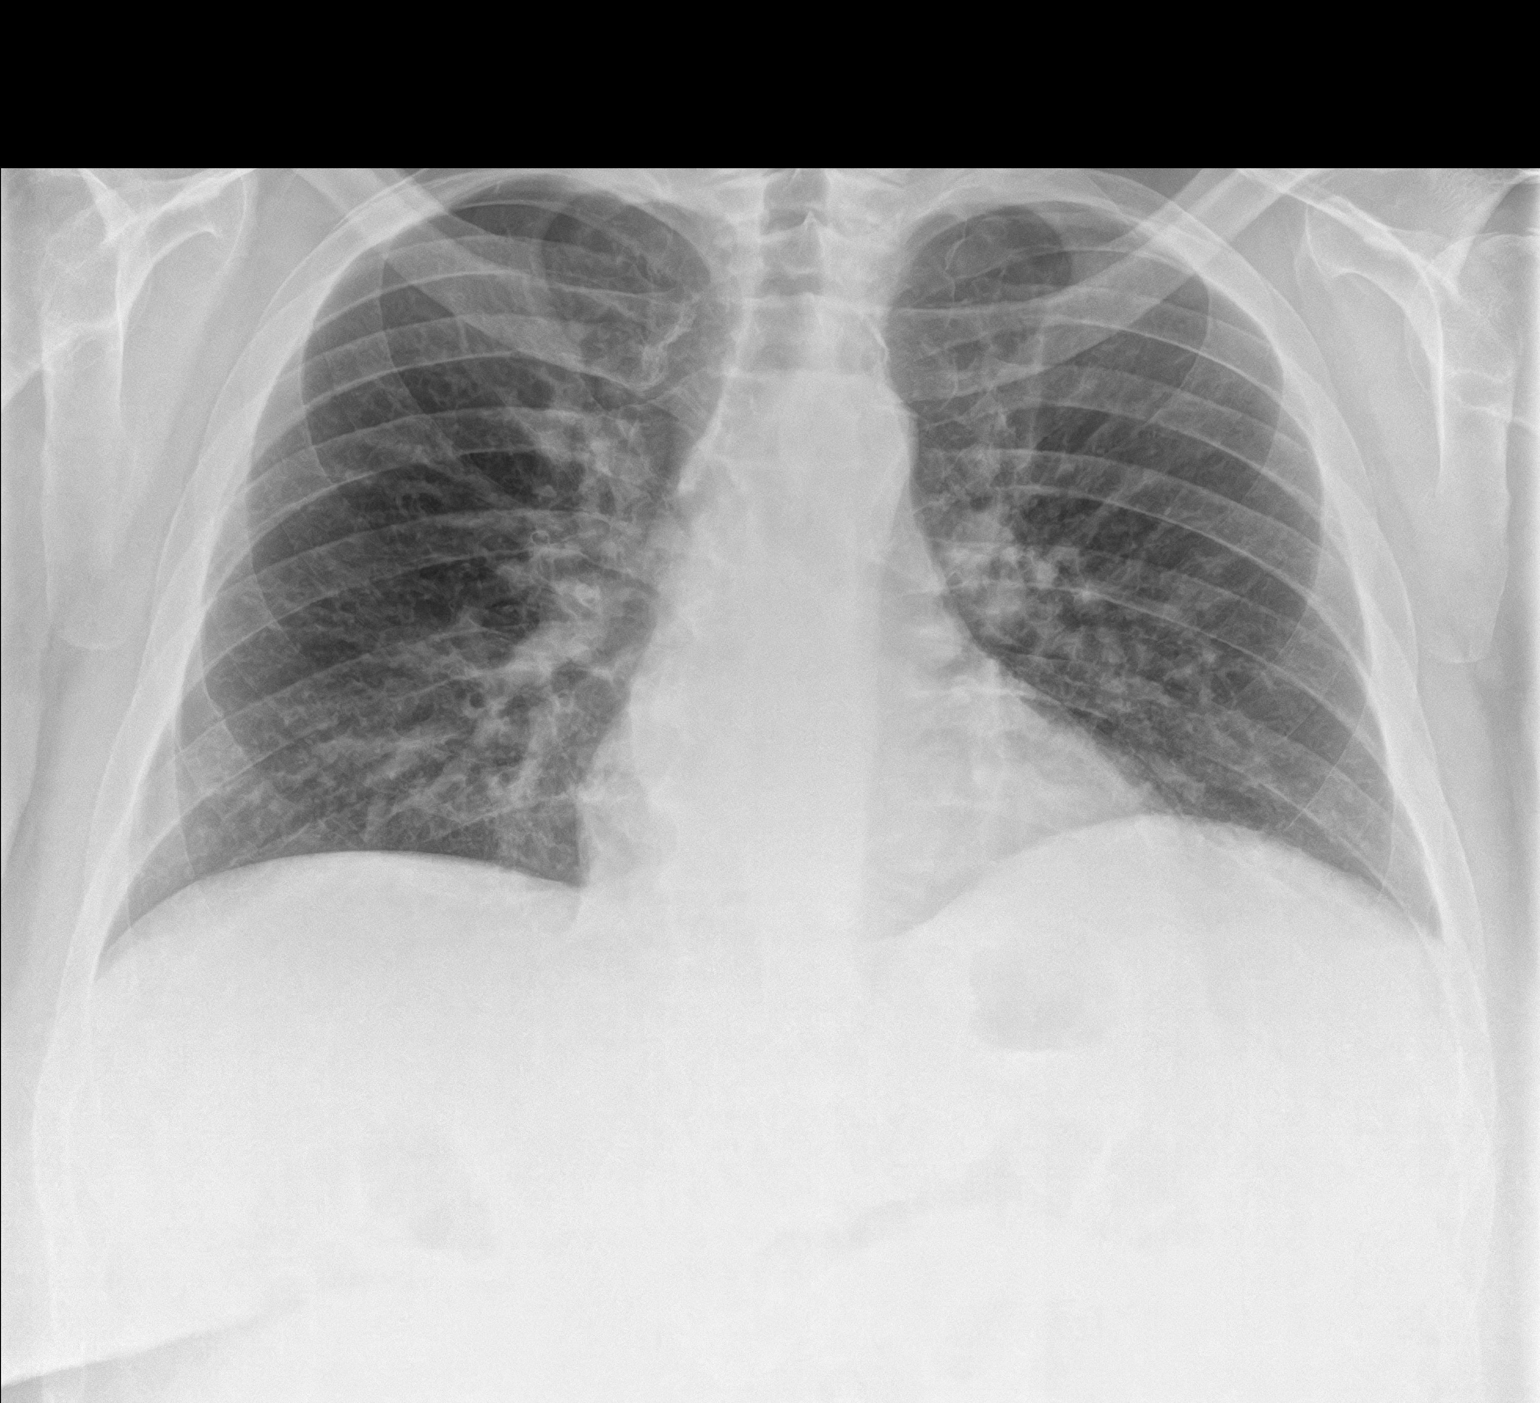
[im 2/2]
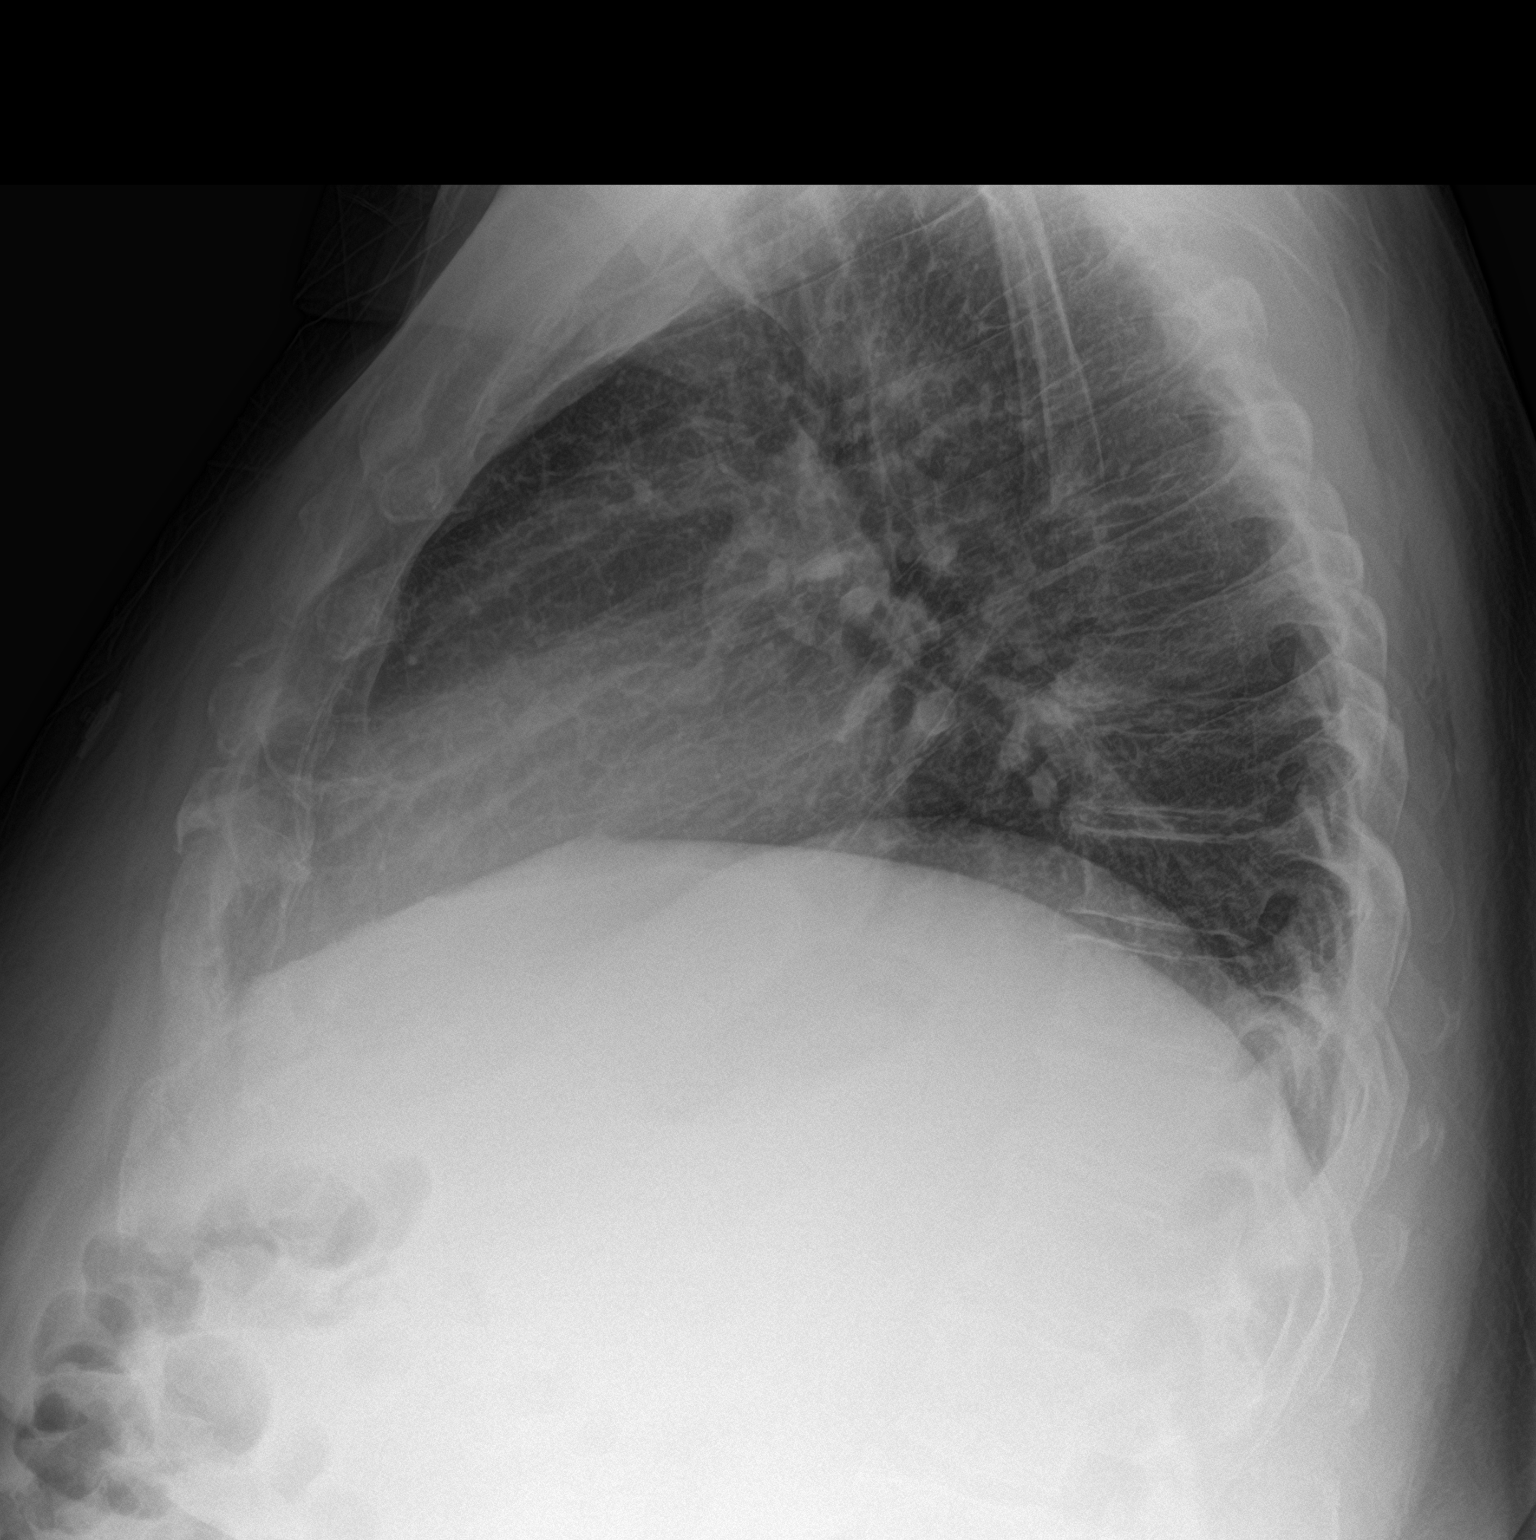

[2 of 2 positions shown; findings below may reference images not displayed]

FINDINGS: Heart size within normal limits. Mild pulmonary vascular congestion.
Minimal basilar atelectasis.
IMPRESSION: Minimal pulmonary vascular congestion.

## 2023-09-30 IMAGING — CT CT HEAD W/O CM
3 series · 16 of 47 positions shown, 19 images · non-contrast
Comparison: None.

CLINICAL DATA: Altered mental status, confusion, hyperammonemic

EXAM:
CT HEAD WITHOUT CONTRAST
TECHNIQUE: Contiguous axial images were obtained from the base of the skull
through the vertex without intravenous contrast.

[Series 2: head wo · axial · 0.45mm/px · z∈[+286,+426]mm · 10 of 34 slices shown, 13 images]
[im 3/34  brain]
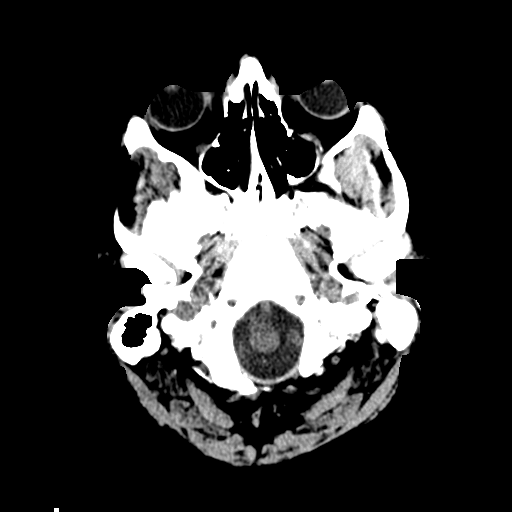
[im 3/34  bone]
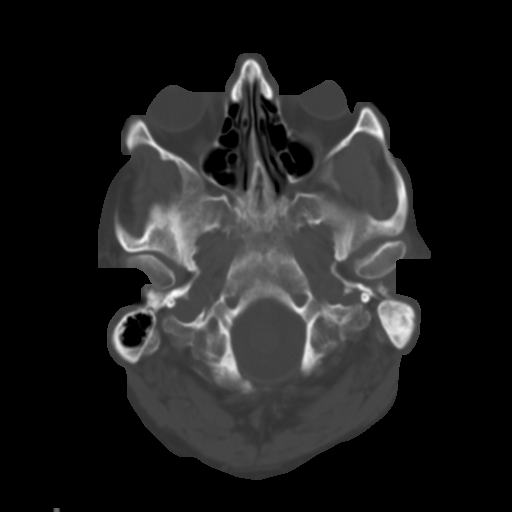
[im 6/34  brain]
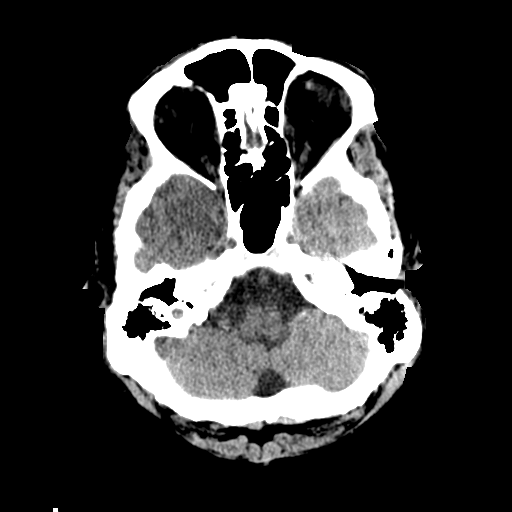
[im 10/34  brain]
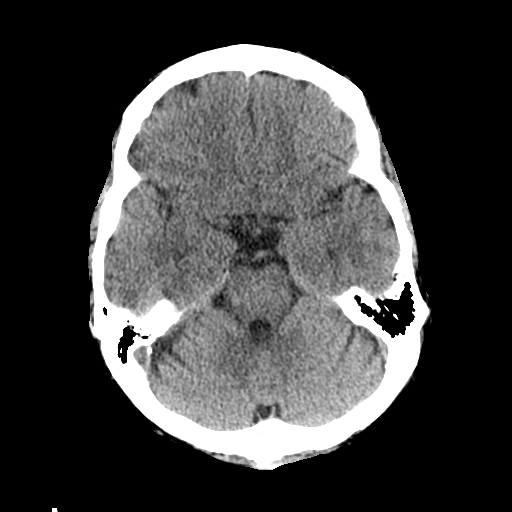
[im 12/34  brain]
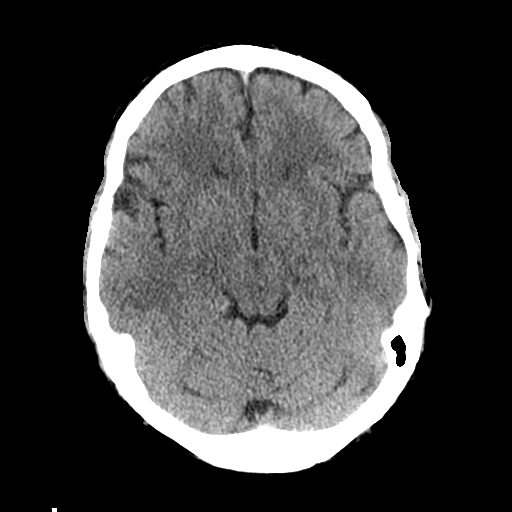
[im 15/34  brain]
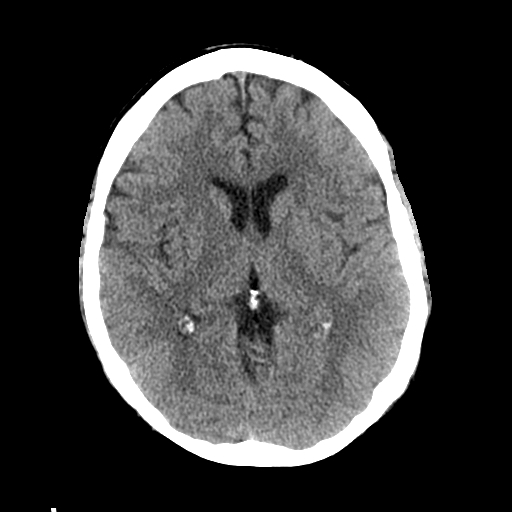
[im 15/34  bone]
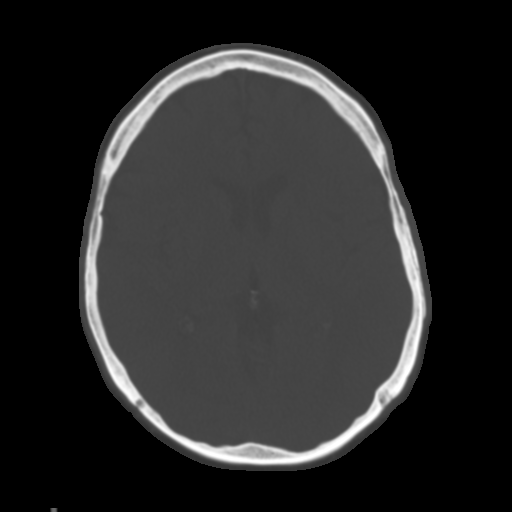
[im 19/34  brain]
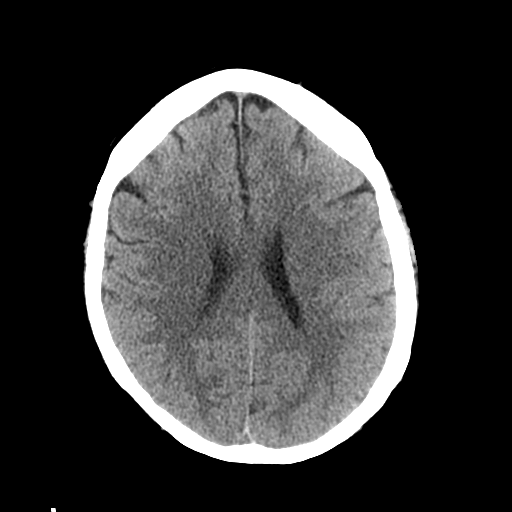
[im 22/34  brain]
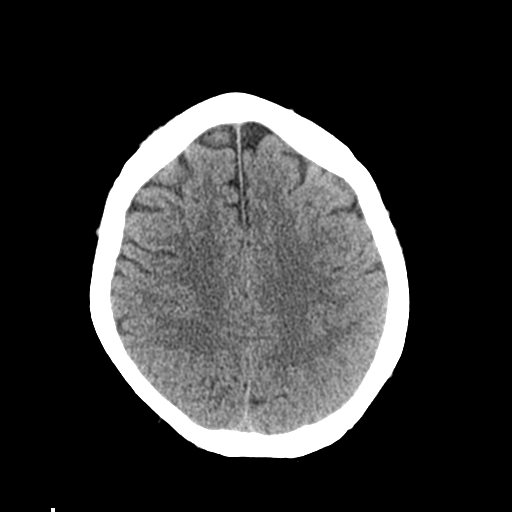
[im 26/34  brain]
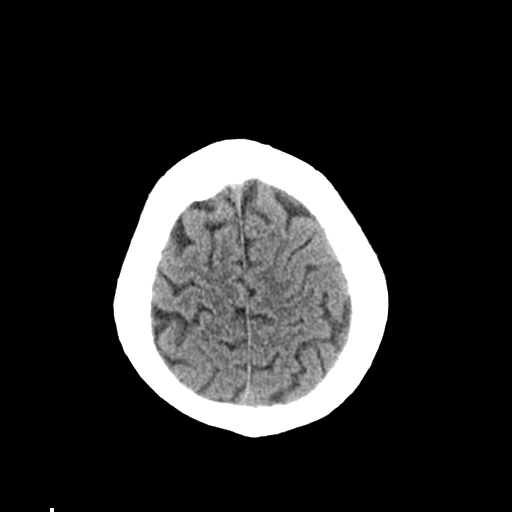
[im 28/34  brain]
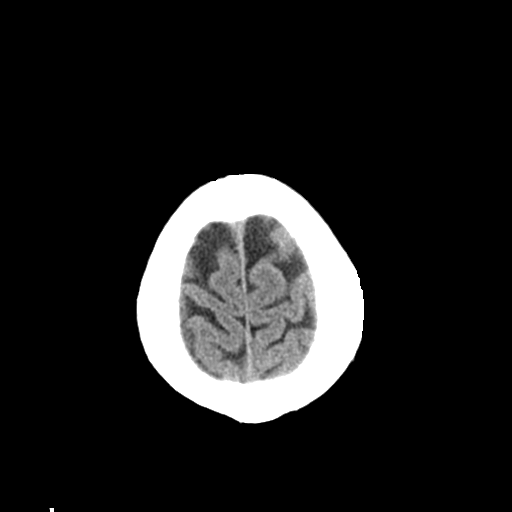
[im 28/34  bone]
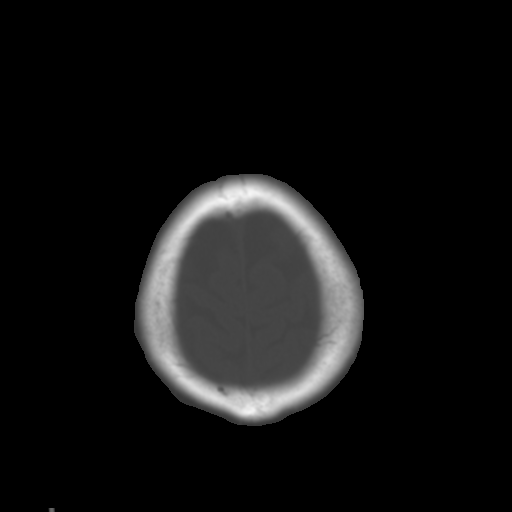
[im 31/34  brain]
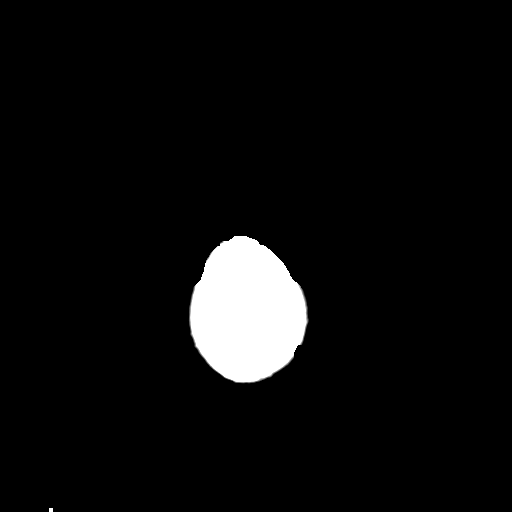

[Series 4: coronal soft tissue · coronal · 0.36mm/px · 3 of 71 slices shown]
[im 24/71  brain]
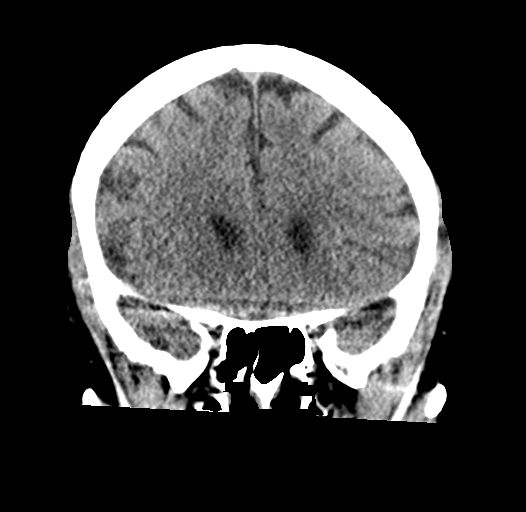
[im 32/71  brain]
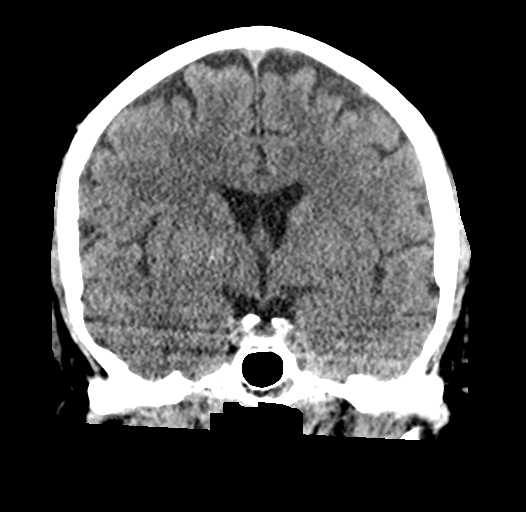
[im 39/71  brain]
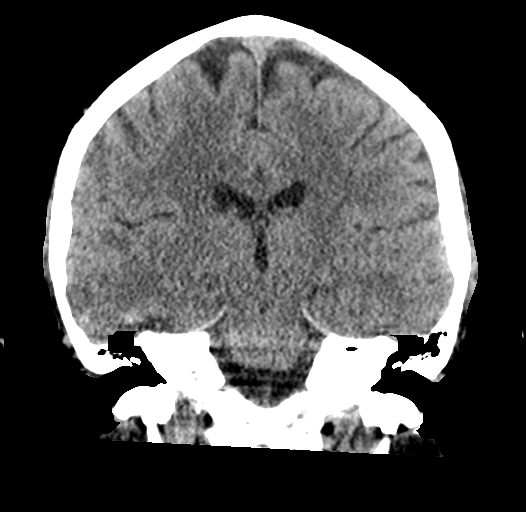

[Series 5: sagittal soft tissue · sagittal · 0.34mm/px · 3 of 63 slices shown]
[im 21/63  brain]
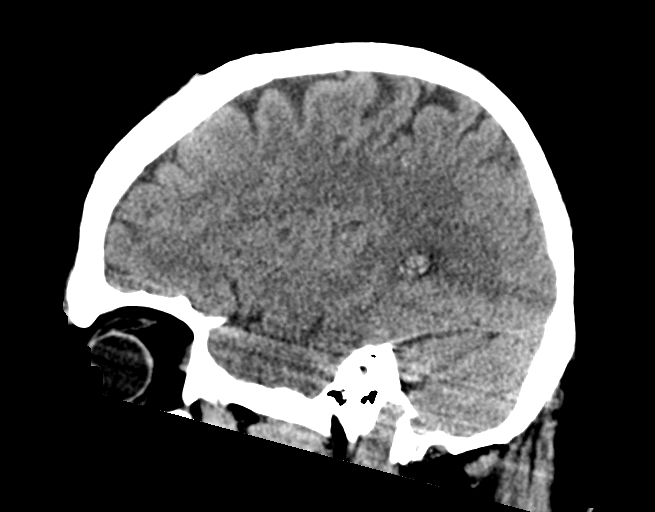
[im 32/63  brain]
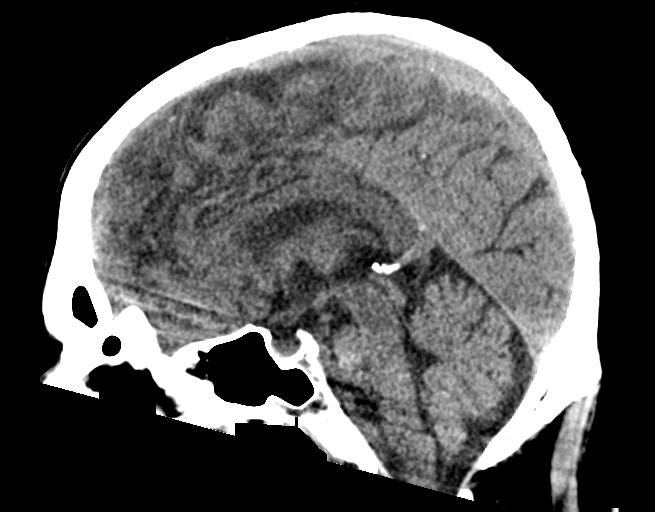
[im 42/63  brain]
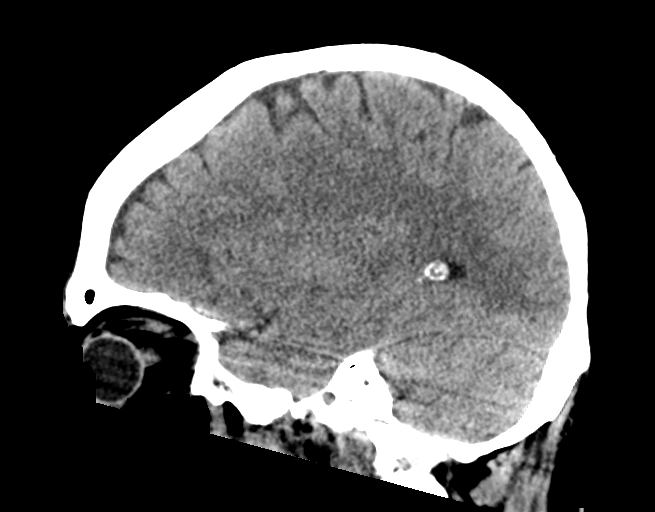

[16 of 47 positions shown; findings below may reference images not displayed]

FINDINGS: Brain: Normal anatomic configuration. No abnormal intra or
extra-axial mass lesion or fluid collection. No abnormal mass effect
or midline shift. No evidence of acute intracranial hemorrhage or
infarct. Ventricular size is normal. Cerebellum unremarkable.

Vascular: Unremarkable

Skull: Intact

Sinuses/Orbits: Paranasal sinuses are clear. Orbits are
unremarkable.

Other: Mastoid air cells and middle ear cavities are clear.
IMPRESSION: No acute intracranial abnormality.
